# Patient Record
Sex: Female | Born: 1935 | Race: Black or African American | Hispanic: No | Smoking: Former smoker
Health system: Southern US, Community
[De-identification: ages and names within clinical notes are randomized; demographics above are authoritative.]

## PROBLEM LIST (undated history)

## (undated) DIAGNOSIS — I509 Heart failure, unspecified: Secondary | ICD-10-CM

## (undated) DIAGNOSIS — F0390 Unspecified dementia without behavioral disturbance: Secondary | ICD-10-CM

## (undated) DIAGNOSIS — I214 Non-ST elevation (NSTEMI) myocardial infarction: Secondary | ICD-10-CM

## (undated) DIAGNOSIS — D649 Anemia, unspecified: Secondary | ICD-10-CM

## (undated) DIAGNOSIS — I2510 Atherosclerotic heart disease of native coronary artery without angina pectoris: Secondary | ICD-10-CM

## (undated) DIAGNOSIS — N184 Chronic kidney disease, stage 4 (severe): Secondary | ICD-10-CM

## (undated) DIAGNOSIS — I739 Peripheral vascular disease, unspecified: Secondary | ICD-10-CM

## (undated) DIAGNOSIS — C50919 Malignant neoplasm of unspecified site of unspecified female breast: Secondary | ICD-10-CM

## (undated) DIAGNOSIS — L280 Lichen simplex chronicus: Secondary | ICD-10-CM

## (undated) DIAGNOSIS — IMO0001 Reserved for inherently not codable concepts without codable children: Secondary | ICD-10-CM

## (undated) DIAGNOSIS — E7800 Pure hypercholesterolemia, unspecified: Secondary | ICD-10-CM

## (undated) DIAGNOSIS — F3289 Other specified depressive episodes: Secondary | ICD-10-CM

## (undated) DIAGNOSIS — D638 Anemia in other chronic diseases classified elsewhere: Secondary | ICD-10-CM

## (undated) DIAGNOSIS — E119 Type 2 diabetes mellitus without complications: Secondary | ICD-10-CM

## (undated) DIAGNOSIS — I2119 ST elevation (STEMI) myocardial infarction involving other coronary artery of inferior wall: Secondary | ICD-10-CM

## (undated) DIAGNOSIS — I10 Essential (primary) hypertension: Secondary | ICD-10-CM

## (undated) DIAGNOSIS — G4700 Insomnia, unspecified: Secondary | ICD-10-CM

## (undated) DIAGNOSIS — F329 Major depressive disorder, single episode, unspecified: Secondary | ICD-10-CM

## (undated) DIAGNOSIS — R195 Other fecal abnormalities: Secondary | ICD-10-CM

## (undated) DIAGNOSIS — I209 Angina pectoris, unspecified: Secondary | ICD-10-CM

## (undated) HISTORY — DX: Other specified depressive episodes: F32.89

## (undated) HISTORY — DX: Major depressive disorder, single episode, unspecified: F32.9

## (undated) HISTORY — DX: Essential (primary) hypertension: I10

## (undated) HISTORY — DX: Type 2 diabetes mellitus without complications: E11.9

## (undated) HISTORY — PX: CATARACT EXTRACTION W/ INTRAOCULAR LENS  IMPLANT, BILATERAL: SHX1307

## (undated) HISTORY — PX: EYE SURGERY: SHX253

## (undated) HISTORY — DX: Peripheral vascular disease, unspecified: I73.9

## (undated) HISTORY — PX: CORONARY ANGIOPLASTY WITH STENT PLACEMENT: SHX49

## (undated) HISTORY — PX: DILATION AND CURETTAGE OF UTERUS: SHX78

## (undated) HISTORY — PX: CARDIAC SURGERY: SHX584

## (undated) HISTORY — DX: Anemia, unspecified: D64.9

## (undated) HISTORY — DX: Atherosclerotic heart disease of native coronary artery without angina pectoris: I25.10

## (undated) HISTORY — DX: Unspecified dementia, unspecified severity, without behavioral disturbance, psychotic disturbance, mood disturbance, and anxiety: F03.90

## (undated) HISTORY — DX: Insomnia, unspecified: G47.00

---

## 1998-01-01 ENCOUNTER — Other Ambulatory Visit: Admission: RE | Admit: 1998-01-01 | Discharge: 1998-01-01 | Payer: Self-pay | Admitting: Family Medicine

## 1998-07-25 DIAGNOSIS — C50919 Malignant neoplasm of unspecified site of unspecified female breast: Secondary | ICD-10-CM

## 1998-07-25 DIAGNOSIS — I2119 ST elevation (STEMI) myocardial infarction involving other coronary artery of inferior wall: Secondary | ICD-10-CM

## 1998-07-25 HISTORY — DX: ST elevation (STEMI) myocardial infarction involving other coronary artery of inferior wall: I21.19

## 1998-07-25 HISTORY — DX: Malignant neoplasm of unspecified site of unspecified female breast: C50.919

## 1998-07-25 HISTORY — PX: CORONARY ARTERY BYPASS GRAFT: SHX141

## 1998-07-25 HISTORY — PX: BREAST LUMPECTOMY: SHX2

## 1998-11-20 ENCOUNTER — Emergency Department (HOSPITAL_COMMUNITY): Admission: EM | Admit: 1998-11-20 | Discharge: 1998-11-20 | Payer: Self-pay | Admitting: Emergency Medicine

## 1998-12-25 ENCOUNTER — Ambulatory Visit (HOSPITAL_COMMUNITY): Admission: RE | Admit: 1998-12-25 | Discharge: 1998-12-25 | Payer: Self-pay | Admitting: Internal Medicine

## 1998-12-25 ENCOUNTER — Encounter: Payer: Self-pay | Admitting: Internal Medicine

## 1999-01-01 ENCOUNTER — Ambulatory Visit (HOSPITAL_COMMUNITY): Admission: RE | Admit: 1999-01-01 | Discharge: 1999-01-01 | Payer: Self-pay | Admitting: Internal Medicine

## 1999-01-01 ENCOUNTER — Encounter: Payer: Self-pay | Admitting: Internal Medicine

## 1999-01-08 ENCOUNTER — Ambulatory Visit (HOSPITAL_COMMUNITY): Admission: RE | Admit: 1999-01-08 | Discharge: 1999-01-08 | Payer: Self-pay | Admitting: Internal Medicine

## 1999-01-08 ENCOUNTER — Encounter (INDEPENDENT_AMBULATORY_CARE_PROVIDER_SITE_OTHER): Payer: Self-pay

## 1999-01-08 ENCOUNTER — Encounter: Payer: Self-pay | Admitting: Internal Medicine

## 1999-01-25 ENCOUNTER — Encounter (HOSPITAL_BASED_OUTPATIENT_CLINIC_OR_DEPARTMENT_OTHER): Payer: Self-pay | Admitting: General Surgery

## 1999-01-27 ENCOUNTER — Encounter (HOSPITAL_BASED_OUTPATIENT_CLINIC_OR_DEPARTMENT_OTHER): Payer: Self-pay | Admitting: General Surgery

## 1999-01-27 ENCOUNTER — Ambulatory Visit (HOSPITAL_COMMUNITY): Admission: RE | Admit: 1999-01-27 | Discharge: 1999-01-27 | Payer: Self-pay | Admitting: General Surgery

## 1999-02-23 ENCOUNTER — Encounter: Admission: RE | Admit: 1999-02-23 | Discharge: 1999-05-24 | Payer: Self-pay | Admitting: Radiation Oncology

## 1999-05-18 ENCOUNTER — Encounter: Payer: Self-pay | Admitting: Emergency Medicine

## 1999-05-18 ENCOUNTER — Inpatient Hospital Stay (HOSPITAL_COMMUNITY): Admission: EM | Admit: 1999-05-18 | Discharge: 1999-05-22 | Payer: Self-pay | Admitting: Emergency Medicine

## 1999-05-18 ENCOUNTER — Encounter: Payer: Self-pay | Admitting: Cardiology

## 1999-05-21 ENCOUNTER — Encounter: Payer: Self-pay | Admitting: Cardiology

## 1999-05-24 ENCOUNTER — Encounter: Admission: RE | Admit: 1999-05-24 | Discharge: 1999-08-22 | Payer: Self-pay | Admitting: Cardiology

## 1999-07-13 ENCOUNTER — Encounter: Payer: Self-pay | Admitting: Ophthalmology

## 1999-07-14 ENCOUNTER — Ambulatory Visit (HOSPITAL_COMMUNITY): Admission: RE | Admit: 1999-07-14 | Discharge: 1999-07-14 | Payer: Self-pay | Admitting: Cardiology

## 1999-07-14 ENCOUNTER — Encounter: Payer: Self-pay | Admitting: Cardiology

## 1999-07-15 ENCOUNTER — Ambulatory Visit (HOSPITAL_COMMUNITY): Admission: RE | Admit: 1999-07-15 | Discharge: 1999-07-16 | Payer: Self-pay | Admitting: Ophthalmology

## 1999-09-21 ENCOUNTER — Encounter: Payer: Self-pay | Admitting: Internal Medicine

## 1999-09-21 ENCOUNTER — Ambulatory Visit (HOSPITAL_COMMUNITY): Admission: RE | Admit: 1999-09-21 | Discharge: 1999-09-21 | Payer: Self-pay | Admitting: Internal Medicine

## 1999-10-14 ENCOUNTER — Ambulatory Visit (HOSPITAL_COMMUNITY): Admission: RE | Admit: 1999-10-14 | Discharge: 1999-10-14 | Payer: Self-pay | Admitting: Ophthalmology

## 2000-02-08 ENCOUNTER — Encounter: Payer: Self-pay | Admitting: Hematology & Oncology

## 2000-02-08 ENCOUNTER — Encounter: Admission: RE | Admit: 2000-02-08 | Discharge: 2000-02-08 | Payer: Self-pay | Admitting: Hematology & Oncology

## 2000-07-13 ENCOUNTER — Other Ambulatory Visit: Admission: RE | Admit: 2000-07-13 | Discharge: 2000-07-13 | Payer: Self-pay | Admitting: Internal Medicine

## 2000-09-13 ENCOUNTER — Ambulatory Visit (HOSPITAL_COMMUNITY): Admission: RE | Admit: 2000-09-13 | Discharge: 2000-09-13 | Payer: Self-pay | Admitting: Gastroenterology

## 2001-03-05 ENCOUNTER — Encounter: Admission: RE | Admit: 2001-03-05 | Discharge: 2001-06-03 | Payer: Self-pay | Admitting: Internal Medicine

## 2001-07-23 ENCOUNTER — Ambulatory Visit (HOSPITAL_COMMUNITY): Admission: RE | Admit: 2001-07-23 | Discharge: 2001-07-23 | Payer: Self-pay | Admitting: Cardiology

## 2001-07-23 ENCOUNTER — Encounter: Payer: Self-pay | Admitting: Cardiology

## 2001-08-02 ENCOUNTER — Ambulatory Visit (HOSPITAL_COMMUNITY): Admission: RE | Admit: 2001-08-02 | Discharge: 2001-08-03 | Payer: Self-pay | Admitting: Cardiology

## 2001-08-28 ENCOUNTER — Other Ambulatory Visit: Admission: RE | Admit: 2001-08-28 | Discharge: 2001-08-28 | Payer: Self-pay | Admitting: Internal Medicine

## 2001-09-21 ENCOUNTER — Encounter: Payer: Self-pay | Admitting: Internal Medicine

## 2001-09-21 ENCOUNTER — Ambulatory Visit (HOSPITAL_COMMUNITY): Admission: RE | Admit: 2001-09-21 | Discharge: 2001-09-21 | Payer: Self-pay | Admitting: Internal Medicine

## 2002-01-30 ENCOUNTER — Encounter: Payer: Self-pay | Admitting: Internal Medicine

## 2002-01-30 ENCOUNTER — Encounter: Admission: RE | Admit: 2002-01-30 | Discharge: 2002-01-30 | Payer: Self-pay | Admitting: Internal Medicine

## 2004-07-25 HISTORY — PX: COMBINED HYSTEROSCOPY DIAGNOSTIC / D&C: SUR297

## 2004-08-09 ENCOUNTER — Inpatient Hospital Stay (HOSPITAL_COMMUNITY): Admission: AD | Admit: 2004-08-09 | Discharge: 2004-08-12 | Payer: Self-pay | Admitting: Cardiology

## 2004-08-23 ENCOUNTER — Ambulatory Visit (HOSPITAL_COMMUNITY): Admission: RE | Admit: 2004-08-23 | Discharge: 2004-08-23 | Payer: Self-pay | Admitting: *Deleted

## 2004-08-23 ENCOUNTER — Encounter (INDEPENDENT_AMBULATORY_CARE_PROVIDER_SITE_OTHER): Payer: Self-pay | Admitting: *Deleted

## 2004-11-08 ENCOUNTER — Encounter: Admission: RE | Admit: 2004-11-08 | Discharge: 2004-11-08 | Payer: Self-pay | Admitting: Orthopedic Surgery

## 2005-03-27 ENCOUNTER — Emergency Department (HOSPITAL_COMMUNITY): Admission: EM | Admit: 2005-03-27 | Discharge: 2005-03-27 | Payer: Self-pay | Admitting: Emergency Medicine

## 2005-04-19 ENCOUNTER — Emergency Department (HOSPITAL_COMMUNITY): Admission: EM | Admit: 2005-04-19 | Discharge: 2005-04-19 | Payer: Self-pay | Admitting: *Deleted

## 2005-11-22 ENCOUNTER — Encounter: Payer: Self-pay | Admitting: Internal Medicine

## 2005-11-24 ENCOUNTER — Inpatient Hospital Stay (HOSPITAL_BASED_OUTPATIENT_CLINIC_OR_DEPARTMENT_OTHER): Admission: RE | Admit: 2005-11-24 | Discharge: 2005-11-24 | Payer: Self-pay | Admitting: Cardiology

## 2005-12-01 ENCOUNTER — Inpatient Hospital Stay (HOSPITAL_COMMUNITY): Admission: AD | Admit: 2005-12-01 | Discharge: 2005-12-03 | Payer: Self-pay | Admitting: Cardiology

## 2005-12-21 ENCOUNTER — Encounter: Admission: RE | Admit: 2005-12-21 | Discharge: 2005-12-21 | Payer: Self-pay | Admitting: Internal Medicine

## 2006-12-15 ENCOUNTER — Ambulatory Visit (HOSPITAL_COMMUNITY): Admission: RE | Admit: 2006-12-15 | Discharge: 2006-12-15 | Payer: Self-pay | Admitting: Cardiology

## 2007-01-09 ENCOUNTER — Encounter: Admission: RE | Admit: 2007-01-09 | Discharge: 2007-01-09 | Payer: Self-pay | Admitting: Internal Medicine

## 2007-01-18 ENCOUNTER — Encounter: Payer: Self-pay | Admitting: Internal Medicine

## 2008-03-12 ENCOUNTER — Emergency Department (HOSPITAL_COMMUNITY): Admission: EM | Admit: 2008-03-12 | Discharge: 2008-03-12 | Payer: Self-pay | Admitting: Family Medicine

## 2008-03-27 ENCOUNTER — Encounter: Payer: Self-pay | Admitting: Internal Medicine

## 2008-05-01 ENCOUNTER — Encounter: Admission: RE | Admit: 2008-05-01 | Discharge: 2008-05-01 | Payer: Self-pay | Admitting: Internal Medicine

## 2008-12-30 ENCOUNTER — Emergency Department (HOSPITAL_COMMUNITY): Admission: EM | Admit: 2008-12-30 | Discharge: 2008-12-30 | Payer: Self-pay | Admitting: Emergency Medicine

## 2008-12-30 ENCOUNTER — Encounter: Payer: Self-pay | Admitting: Internal Medicine

## 2008-12-30 LAB — CONVERTED CEMR LAB
Bilirubin Urine: NEGATIVE
CO2: 21 meq/L
Calcium: 8.8 mg/dL
Eosinophils Relative: 0 %
Glucose, Urine, Semiquant: NEGATIVE
Ketones, urine, test strip: NEGATIVE
Monocytes Relative: 0.4 %
Neutrophils Relative %: 6.8 %
Potassium: 4.2 meq/L
RDW: 14 %
Sodium: 141 meq/L

## 2009-03-09 ENCOUNTER — Encounter: Payer: Self-pay | Admitting: Internal Medicine

## 2009-03-09 DIAGNOSIS — E1165 Type 2 diabetes mellitus with hyperglycemia: Secondary | ICD-10-CM

## 2009-03-09 DIAGNOSIS — I10 Essential (primary) hypertension: Secondary | ICD-10-CM

## 2009-03-09 DIAGNOSIS — E1129 Type 2 diabetes mellitus with other diabetic kidney complication: Secondary | ICD-10-CM

## 2009-03-10 ENCOUNTER — Ambulatory Visit: Payer: Self-pay | Admitting: Internal Medicine

## 2009-03-10 DIAGNOSIS — E785 Hyperlipidemia, unspecified: Secondary | ICD-10-CM

## 2009-03-10 DIAGNOSIS — I2510 Atherosclerotic heart disease of native coronary artery without angina pectoris: Secondary | ICD-10-CM

## 2009-03-10 DIAGNOSIS — I739 Peripheral vascular disease, unspecified: Secondary | ICD-10-CM

## 2009-03-10 DIAGNOSIS — D649 Anemia, unspecified: Secondary | ICD-10-CM

## 2009-03-10 LAB — CONVERTED CEMR LAB
Albumin: 4.2 g/dL (ref 3.5–5.2)
BUN: 30 mg/dL — ABNORMAL HIGH (ref 6–23)
Basophils Absolute: 0 10*3/uL (ref 0.0–0.1)
Calcium: 9.3 mg/dL (ref 8.4–10.5)
Creatinine, Ser: 1.4 mg/dL — ABNORMAL HIGH (ref 0.4–1.2)
GFR calc non Af Amer: 46.75 mL/min (ref >60)
Hemoglobin: 10.3 g/dL — ABNORMAL LOW (ref 12.0–15.0)
Hgb A1c MFr Bld: 7.3 % — ABNORMAL HIGH (ref 4.6–6.5)
LDL Cholesterol: 80 mg/dL (ref 0–99)
Lymphocytes Relative: 33.7 % (ref 12.0–46.0)
Monocytes Relative: 7.5 % (ref 3.0–12.0)
Neutro Abs: 2.3 10*3/uL (ref 1.4–7.7)
Neutrophils Relative %: 56.2 % (ref 43.0–77.0)
Potassium: 5.8 meq/L — ABNORMAL HIGH (ref 3.5–5.1)
RBC: 3.18 M/uL — ABNORMAL LOW (ref 3.87–5.11)
RDW: 13.5 % (ref 11.5–14.6)
Saturation Ratios: 21 % (ref 20.0–50.0)
Total CHOL/HDL Ratio: 3
Total Protein: 8.1 g/dL (ref 6.0–8.3)
Transferrin: 231.1 mg/dL (ref 212.0–360.0)
Triglycerides: 63 mg/dL (ref 0.0–149.0)
Vitamin B-12: 291 pg/mL (ref 211–911)

## 2009-03-13 ENCOUNTER — Ambulatory Visit: Payer: Self-pay

## 2009-03-13 ENCOUNTER — Encounter: Payer: Self-pay | Admitting: Internal Medicine

## 2009-03-16 ENCOUNTER — Encounter: Payer: Self-pay | Admitting: Internal Medicine

## 2009-03-18 ENCOUNTER — Encounter: Payer: Self-pay | Admitting: Internal Medicine

## 2009-03-25 ENCOUNTER — Ambulatory Visit: Payer: Self-pay | Admitting: Internal Medicine

## 2009-03-25 DIAGNOSIS — G4700 Insomnia, unspecified: Secondary | ICD-10-CM | POA: Insufficient documentation

## 2009-03-31 ENCOUNTER — Encounter: Payer: Self-pay | Admitting: Internal Medicine

## 2009-04-04 DIAGNOSIS — Z8679 Personal history of other diseases of the circulatory system: Secondary | ICD-10-CM | POA: Insufficient documentation

## 2009-04-20 ENCOUNTER — Ambulatory Visit: Payer: Self-pay | Admitting: Cardiovascular Disease

## 2009-06-24 ENCOUNTER — Ambulatory Visit: Payer: Self-pay | Admitting: Internal Medicine

## 2009-06-24 DIAGNOSIS — F329 Major depressive disorder, single episode, unspecified: Secondary | ICD-10-CM

## 2009-06-24 DIAGNOSIS — F0390 Unspecified dementia without behavioral disturbance: Secondary | ICD-10-CM

## 2009-06-24 LAB — CONVERTED CEMR LAB
ALT: 13 units/L (ref 0–35)
AST: 25 units/L (ref 0–37)
Basophils Relative: 0.5 % (ref 0.0–3.0)
Bilirubin, Direct: 0.1 mg/dL (ref 0.0–0.3)
Chloride: 111 meq/L (ref 96–112)
Eosinophils Relative: 2.2 % (ref 0.0–5.0)
GFR calc non Af Amer: 46.72 mL/min (ref >60)
HCT: 30.9 % — ABNORMAL LOW (ref 36.0–46.0)
Hgb A1c MFr Bld: 8.5 % — ABNORMAL HIGH (ref 4.6–6.5)
Lymphs Abs: 1.4 10*3/uL (ref 0.7–4.0)
MCV: 96.4 fL (ref 78.0–100.0)
Monocytes Absolute: 0.3 10*3/uL (ref 0.1–1.0)
Monocytes Relative: 6 % (ref 3.0–12.0)
Potassium: 4.9 meq/L (ref 3.5–5.1)
RBC: 3.21 M/uL — ABNORMAL LOW (ref 3.87–5.11)
Sodium: 142 meq/L (ref 135–145)
TSH: 1.1 microintl units/mL (ref 0.35–5.50)
Total Protein: 7.5 g/dL (ref 6.0–8.3)
WBC: 5.2 10*3/uL (ref 4.5–10.5)

## 2009-06-26 ENCOUNTER — Encounter (INDEPENDENT_AMBULATORY_CARE_PROVIDER_SITE_OTHER): Payer: Self-pay | Admitting: *Deleted

## 2009-07-01 ENCOUNTER — Ambulatory Visit: Payer: Self-pay | Admitting: Cardiology

## 2009-07-06 ENCOUNTER — Encounter: Payer: Self-pay | Admitting: Internal Medicine

## 2009-07-07 ENCOUNTER — Encounter: Payer: Self-pay | Admitting: Internal Medicine

## 2009-07-29 ENCOUNTER — Ambulatory Visit: Payer: Self-pay | Admitting: Internal Medicine

## 2009-08-18 ENCOUNTER — Ambulatory Visit: Payer: Self-pay | Admitting: Cardiovascular Disease

## 2009-08-18 DIAGNOSIS — R21 Rash and other nonspecific skin eruption: Secondary | ICD-10-CM | POA: Insufficient documentation

## 2009-08-19 ENCOUNTER — Ambulatory Visit: Payer: Self-pay | Admitting: Internal Medicine

## 2009-09-04 ENCOUNTER — Ambulatory Visit: Payer: Self-pay | Admitting: Cardiovascular Disease

## 2009-09-07 DIAGNOSIS — R55 Syncope and collapse: Secondary | ICD-10-CM

## 2009-09-08 ENCOUNTER — Encounter (INDEPENDENT_AMBULATORY_CARE_PROVIDER_SITE_OTHER): Payer: Self-pay | Admitting: Internal Medicine

## 2009-09-08 ENCOUNTER — Ambulatory Visit: Payer: Self-pay | Admitting: Vascular Surgery

## 2009-09-08 ENCOUNTER — Inpatient Hospital Stay (HOSPITAL_COMMUNITY): Admission: EM | Admit: 2009-09-08 | Discharge: 2009-09-10 | Payer: Self-pay | Admitting: Emergency Medicine

## 2009-09-08 ENCOUNTER — Ambulatory Visit: Payer: Self-pay | Admitting: Cardiology

## 2009-09-09 ENCOUNTER — Encounter (INDEPENDENT_AMBULATORY_CARE_PROVIDER_SITE_OTHER): Payer: Self-pay | Admitting: Internal Medicine

## 2009-09-11 ENCOUNTER — Emergency Department (HOSPITAL_COMMUNITY): Admission: EM | Admit: 2009-09-11 | Discharge: 2009-09-11 | Payer: Self-pay | Admitting: Family Medicine

## 2009-09-14 ENCOUNTER — Ambulatory Visit: Payer: Self-pay | Admitting: Internal Medicine

## 2009-09-16 ENCOUNTER — Telehealth: Payer: Self-pay | Admitting: Internal Medicine

## 2009-10-06 ENCOUNTER — Telehealth: Payer: Self-pay | Admitting: Internal Medicine

## 2009-10-11 ENCOUNTER — Inpatient Hospital Stay (HOSPITAL_COMMUNITY): Admission: EM | Admit: 2009-10-11 | Discharge: 2009-10-14 | Payer: Self-pay | Admitting: Emergency Medicine

## 2009-10-12 ENCOUNTER — Encounter (INDEPENDENT_AMBULATORY_CARE_PROVIDER_SITE_OTHER): Payer: Self-pay | Admitting: Cardiology

## 2009-10-12 LAB — CONVERTED CEMR LAB
HDL: 27 mg/dL
Hemoglobin: 9.8 g/dL
LDL Cholesterol: 73 mg/dL
Platelets: 201 10*3/uL
WBC: 5.4 10*3/uL

## 2009-10-22 ENCOUNTER — Encounter (INDEPENDENT_AMBULATORY_CARE_PROVIDER_SITE_OTHER): Payer: Self-pay | Admitting: *Deleted

## 2009-10-23 DIAGNOSIS — I214 Non-ST elevation (NSTEMI) myocardial infarction: Secondary | ICD-10-CM

## 2009-10-23 HISTORY — DX: Non-ST elevation (NSTEMI) myocardial infarction: I21.4

## 2009-10-29 ENCOUNTER — Encounter (HOSPITAL_COMMUNITY): Admission: RE | Admit: 2009-10-29 | Discharge: 2010-01-05 | Payer: Self-pay | Admitting: Cardiology

## 2009-11-02 ENCOUNTER — Inpatient Hospital Stay (HOSPITAL_COMMUNITY): Admission: EM | Admit: 2009-11-02 | Discharge: 2009-11-04 | Payer: Self-pay | Admitting: Emergency Medicine

## 2009-11-02 LAB — CONVERTED CEMR LAB: TSH: 2.086 microintl units/mL

## 2009-11-04 LAB — CONVERTED CEMR LAB
Calcium: 8.5 mg/dL
Chloride: 108 meq/L
Glucose, Bld: 137 mg/dL
Potassium: 3.8 meq/L
Sodium: 139 meq/L

## 2009-11-11 ENCOUNTER — Encounter: Payer: Self-pay | Admitting: Internal Medicine

## 2009-11-16 ENCOUNTER — Encounter: Payer: Self-pay | Admitting: Internal Medicine

## 2009-11-18 ENCOUNTER — Emergency Department (HOSPITAL_COMMUNITY): Admission: EM | Admit: 2009-11-18 | Discharge: 2009-11-18 | Payer: Self-pay | Admitting: Family Medicine

## 2009-11-23 ENCOUNTER — Ambulatory Visit: Payer: Self-pay | Admitting: Internal Medicine

## 2009-12-01 ENCOUNTER — Ambulatory Visit: Payer: Self-pay | Admitting: Internal Medicine

## 2009-12-01 DIAGNOSIS — J329 Chronic sinusitis, unspecified: Secondary | ICD-10-CM | POA: Insufficient documentation

## 2009-12-04 ENCOUNTER — Encounter: Payer: Self-pay | Admitting: Internal Medicine

## 2009-12-11 ENCOUNTER — Ambulatory Visit: Payer: Self-pay | Admitting: Internal Medicine

## 2009-12-11 DIAGNOSIS — R05 Cough: Secondary | ICD-10-CM

## 2009-12-28 ENCOUNTER — Telehealth: Payer: Self-pay | Admitting: Internal Medicine

## 2010-01-07 ENCOUNTER — Telehealth: Payer: Self-pay | Admitting: Internal Medicine

## 2010-01-11 ENCOUNTER — Telehealth: Payer: Self-pay | Admitting: Internal Medicine

## 2010-01-27 ENCOUNTER — Ambulatory Visit: Payer: Self-pay | Admitting: Internal Medicine

## 2010-01-29 ENCOUNTER — Ambulatory Visit: Payer: Self-pay | Admitting: Internal Medicine

## 2010-03-01 ENCOUNTER — Ambulatory Visit: Payer: Self-pay | Admitting: Internal Medicine

## 2010-03-02 ENCOUNTER — Ambulatory Visit: Payer: Self-pay | Admitting: Internal Medicine

## 2010-03-15 ENCOUNTER — Encounter: Payer: Self-pay | Admitting: Internal Medicine

## 2010-03-16 ENCOUNTER — Ambulatory Visit: Payer: Self-pay

## 2010-03-16 ENCOUNTER — Encounter: Payer: Self-pay | Admitting: Internal Medicine

## 2010-03-30 ENCOUNTER — Encounter: Payer: Self-pay | Admitting: Internal Medicine

## 2010-04-08 ENCOUNTER — Encounter: Payer: Self-pay | Admitting: Internal Medicine

## 2010-04-13 ENCOUNTER — Telehealth: Payer: Self-pay | Admitting: Internal Medicine

## 2010-04-19 ENCOUNTER — Encounter: Payer: Self-pay | Admitting: Internal Medicine

## 2010-04-26 ENCOUNTER — Encounter: Payer: Self-pay | Admitting: Internal Medicine

## 2010-05-17 ENCOUNTER — Encounter: Payer: Self-pay | Admitting: Internal Medicine

## 2010-05-27 ENCOUNTER — Encounter: Payer: Self-pay | Admitting: Internal Medicine

## 2010-05-31 ENCOUNTER — Telehealth: Payer: Self-pay | Admitting: Internal Medicine

## 2010-06-02 ENCOUNTER — Ambulatory Visit: Payer: Self-pay | Admitting: Internal Medicine

## 2010-06-03 LAB — CONVERTED CEMR LAB
Basophils Absolute: 0 10*3/uL (ref 0.0–0.1)
Basophils Relative: 0.5 % (ref 0.0–3.0)
Eosinophils Absolute: 0.2 10*3/uL (ref 0.0–0.7)
Hemoglobin: 10.2 g/dL — ABNORMAL LOW (ref 12.0–15.0)
Lymphs Abs: 1.2 10*3/uL (ref 0.7–4.0)
MCHC: 34 g/dL (ref 30.0–36.0)
MCV: 94.7 fL (ref 78.0–100.0)
Monocytes Absolute: 0.4 10*3/uL (ref 0.1–1.0)
Neutro Abs: 3.5 10*3/uL (ref 1.4–7.7)
RBC: 3.17 M/uL — ABNORMAL LOW (ref 3.87–5.11)
RDW: 14.1 % (ref 11.5–14.6)

## 2010-06-25 ENCOUNTER — Encounter: Payer: Self-pay | Admitting: Internal Medicine

## 2010-07-16 ENCOUNTER — Encounter: Payer: Self-pay | Admitting: Internal Medicine

## 2010-08-18 ENCOUNTER — Ambulatory Visit
Admission: RE | Admit: 2010-08-18 | Discharge: 2010-08-18 | Payer: Self-pay | Source: Home / Self Care | Attending: Internal Medicine | Admitting: Internal Medicine

## 2010-08-24 ENCOUNTER — Ambulatory Visit
Admission: RE | Admit: 2010-08-24 | Discharge: 2010-08-24 | Payer: Self-pay | Source: Home / Self Care | Attending: Internal Medicine | Admitting: Internal Medicine

## 2010-08-24 NOTE — Progress Notes (Signed)
Summary: Cough  Phone Note Call from Patient Call back at Home Phone [phone]   Summary of Call: Family memeber called for pt. Pt completed medication and has recurence of her cough. Please advise.  Initial call taken by: Lamar Sprinkles, CMA,  January 07, 2010 5:17 PM  Follow-up for Phone Call        will refer to pulm - order done Follow-up by: Newt Lukes MD,  January 07, 2010 6:14 PM  Additional Follow-up for Phone Call Additional follow up Details #1::        pt informed Additional Follow-up by: Margaret Pyle, CMA,  January 08, 2010 9:49 AM

## 2010-08-24 NOTE — Progress Notes (Signed)
Summary: DM supplier clarification  Phone Note Outgoing Call   Call placed by: Orlan Leavens RMA,  May 31, 2010 12:04 PM Call placed to: Patient Details for Reason: FYI Summary of Call: Called pt to verify which diabetic supply place she get her supplies from. Md keep getting several fax's from different places. Pt states she use [facility]. Also pt states she need appt to see md to discuss her blood sugars. Was told by cardiologist to stop taking metformin, but pt states she did not stop taking because her blood sugars has been running high. Made appt 06/02/10 @ 11:15. Initial call taken by: Orlan Leavens RMA,  May 31, 2010 12:05 PM  Follow-up for Phone Call        ok - noted - thanks Follow-up by: Newt Lukes MD,  May 31, 2010 12:14 PM

## 2010-08-24 NOTE — Progress Notes (Signed)
Summary: cough  Phone Note Call from Patient   Caller: [name] [phone] Summary of Call: pt's son called stating that pt has cough but can not "get mucous up". pt's son is requesting MD advised about OTC medication safe for pt to use to help "break it". please advise Initial call taken by: Margaret Pyle, CMA,  September 16, 2009 2:52 PM  Follow-up for Phone Call        try robitussin (plain) syrup or tablet - drink with lots of water Follow-up by: Newt Lukes MD,  September 16, 2009 3:28 PM  Additional Follow-up for Phone Call Additional follow up Details #1::        Son informed, pt also informed Additional Follow-up by: Margaret Pyle, CMA,  September 16, 2009 3:34 PM

## 2010-08-24 NOTE — Assessment & Plan Note (Signed)
Summary: 3-4 wk f/u #/cd   Vital Signs:  Patient profile:   77 year old female Height:      63 inches (160.02 cm) Weight:      162.0 pounds (73.64 kg) O2 Sat:      98 % on Room air Temp:     98.2 degrees F (36.78 degrees C) oral Pulse rate:   67 / minute BP sitting:   144 / 78  (left arm) Cuff size:   large  Vitals Entered By: Orlan Leavens (July 29, 2009 10:54 AM)  O2 Flow:  Room air CC: 4 week follow-up Is Patient Diabetic? Yes Did you bring your meter with you today? No Pain Assessment Patient in pain? no        Primary Care Provider:  Newt Lukes MD  CC:  4 week follow-up.  History of Present Illness: here for followup on several issues -  1) memory loss -  reviewed labs and head ct - normal pt reports she is under emotional stress due to son's poor health (bad liver) feels this is why she is distracted and not thinking straight - forgot to pay light bill agrees to starting medication for depression  2) DM2 - reports sugars much improved due to starting new med (glipizide added 12/1) no hypoglycemia -  3)HTN - reports compliance with ongoing medical treatment and no changes in medication dose or frequency. denies adverse side effects related to current therapy.   Current Medications (verified): 1)  Metformin Hcl 1000 Mg Tabs (Metformin Hcl) .... Take 1 Two Times A Day 2)  Lisinopril 20 Mg Tabs (Lisinopril) .... Take 1 By Mouth Qd 3)  Plavix 75 Mg Tabs (Clopidogrel Bisulfate) .... Take 1 By Mouth Qd 4)  Lipitor 20 Mg Tabs (Atorvastatin Calcium) .... Take 1 By Mouth Qd 5)  Nitrostat 0.4 Mg Subl (Nitroglycerin) .... Use Prn 6)  Daily Vitamins For Women  Tabs (Multiple Vitamins-Calcium) .... Take 1 By Mouth Qd 7)  Glipizide 2.5 Mg Xr24h-Tab (Glipizide) .Marland Kitchen.. 1 By Mouth Every Morning  Allergies (verified): No Known Drug Allergies  Past History:  Past Medical History: Last updated: 06/24/2009 CAD (ICD-414.00), reports MI in 2000, treating with PCI  (stenting) PVD (ICD-443.9) ANGINA, HX OF (ICD-V12.50) HYPERTENSION (ICD-401.9) DYSLIPIDEMIA (ICD-272.4) INSOMNIA, CHRONIC (ICD-307.42) ANEMIA (ICD-285.9) DIABETES MELLITUS, TYPE II (ICD-250.00) HEPATOTOXICITY, DRUG-INDUCED, RISK OF (ICD-V58.69) PERSONAL HISTORY OF MALIGNANT NEOPLASM OF BREAST (ICD-V10.3)  Review of Systems  The patient denies fever, chest pain, syncope, and headaches.    Physical Exam  General:  alert, well-developed, well-nourished, and cooperative to examination.    Lungs:  normal respiratory effort, no intercostal retractions or use of accessory muscles; normal breath sounds bilaterally - no crackles and no wheezes.    Heart:  normal rate, regular rhythm, no murmur, and no rub. BLE without edema.  Psych:  Oriented X3, normally interactive, poor eye contact, not anxious appearing, mildly depressed appearing, but not agitated.      Impression & Recommendations:  Problem # 1:  DEPRESSION (ICD-311)  likely contrib to forgetfulness - start low dose SSRI -  f/u 2 mos to monitor effects, sooner if problems consider aricept trial if still no notable improvment Her updated medication list for this problem includes:    Paxil 10 Mg Tabs (Paroxetine hcl) .Marland Kitchen... 1 by mouth at bedtime  Orders: Prescription Created Electronically [phone])  Problem # 2:  DIABETES MELLITUS, TYPE II (ICD-250.00)  pt reports sugars "perfect" when she took med two times a  day (by mistake - misunderstood instructs!) - as no hypoglycemia, ok to cont two times a day - new rx send recheck a1c next visit Her updated medication list for this problem includes:    Metformin Hcl 1000 Mg Tabs (Metformin hcl) .Marland Kitchen... Take 1 two times a day    Lisinopril 20 Mg Tabs (Lisinopril) .Marland Kitchen... Take 1 by mouth qd    Glipizide 2.5 Mg Xr24h-tab (Glipizide) .Marland Kitchen... 1 by mouth two times a day  Orders: Prescription Created Electronically [phone])  Complete Medication List: 1)  Metformin Hcl 1000 Mg Tabs (Metformin  hcl) .... Take 1 two times a day 2)  Lisinopril 20 Mg Tabs (Lisinopril) .... Take 1 by mouth qd 3)  Plavix 75 Mg Tabs (Clopidogrel bisulfate) .... Take 1 by mouth qd 4)  Lipitor 20 Mg Tabs (Atorvastatin calcium) .... Take 1 by mouth qd 5)  Nitrostat 0.4 Mg Subl (Nitroglycerin) .... Use prn 6)  Daily Vitamins For Women Tabs (Multiple vitamins-calcium) .... Take 1 by mouth qd 7)  Glipizide 2.5 Mg Xr24h-tab (Glipizide) .Marland Kitchen.. 1 by mouth two times a day 8)  Paxil 10 Mg Tabs (Paroxetine hcl) .Marland Kitchen.. 1 by mouth at bedtime  Patient Instructions: 1)  it was good to see you today.  2)  will start new medication for depression to try and help memory function - Paxil - your prescriptions have been electronically submitted to your pharmacy. Please take as directed. Contact our office if you believe you're having problems with the medication(s).  3)  will also increase Glipizide to two times a day for your diabetes - call if sugar is less than 70. 4)  Please schedule a follow-up appointment in 2 months to review the memory problems and effects of new medication, call sooner if problems.  Prescriptions: GLIPIZIDE 2.5 MG XR24H-TAB (GLIPIZIDE) 1 by mouth two times a day  #60 x 2   Entered and Authorized by:   Newt Lukes MD   Signed by:   Newt Lukes MD on 07/29/2009   Method used:   [facility] to        [facility] [address] [phone]* (retail)       [address]       [place], [place]  [address]       Ph: [phone] or [phone]       Fax: [phone]   RxID:   [id number] PAXIL 10 MG TABS (PAROXETINE HCL) 1 by mouth at bedtime  #30 x 2   Entered and Authorized by:   Newt Lukes MD   Signed by:   Newt Lukes MD on 07/29/2009   Method used:   [facility]        [facility] [address] [phone]* (retail)       [address]       [place], [place]  [address]       Ph: [phone] or [phone]       Fax: [phone]    RxID:   [id number]

## 2010-08-24 NOTE — Progress Notes (Signed)
Summary: iophen-c nr liquid  Phone Note Refill Request Message from:  Fax from Pharmacy on January 11, 2010 1:06 PM  Refills Requested: Medication #1:  Iophen-c NR Liquid take 1 teaspoon po tid for 2 days, then every 6 hours prn for cough # 200.0 ml   Last Refilled: 12/11/2009 [email or web address]  [phone]  Initial call taken by: Orlan Leavens,  January 11, 2010 1:08 PM  Follow-up for Phone Call        ok to fill as prev rx, no refills - thanks Follow-up by: Newt Lukes MD,  January 11, 2010 1:24 PM  Additional Follow-up for Phone Call Additional follow up Details #1::        Faxed  paper request back to pharmacy ok x's 1 only per md Additional Follow-up by: Orlan Leavens,  January 11, 2010 1:30 PM    New/Updated Medications: IOPHEN C-NR 100-10 MG/5ML LIQD (GUAIFENESIN-CODEINE) take 1 teaspoon by mouth three times a day for 2 days, then every 6 hours as needed for cough Prescriptions: IOPHEN C-NR 100-10 MG/5ML LIQD (GUAIFENESIN-CODEINE) take 1 teaspoon by mouth three times a day for 2 days, then every 6 hours as needed for cough  #22ml x 0   Entered by:   Orlan Leavens   Authorized by:   Newt Lukes MD   Signed by:   Orlan Leavens on 01/11/2010   Method used:   Telephoned to ...       [facility] [address] [phone]* (retail)       [address]       [place], [place]  [address]       Ph: [phone] or [phone]       Fax: [phone]   RxID:   [id number]

## 2010-08-24 NOTE — Letter (Signed)
Summary: Appointment - Missed  [facility], Main Office  [address]   [place], [place] [address]   Phone: [phone]  Fax: [phone]     October 22, 2009 MRN: [id number]   [facility] [address] [address] [place], [place]  [address]   Dear Ms. Elford,  Our records indicate you missed your appointment on     09/24/09                   with Dr.       .          Excell Seltzer                          It is very important that we reach you to reschedule this appointment. We look forward to participating in your health care needs. Please contact us at the number listed above at your earliest convenience to reschedule this appointment.     Sincerely,    Glass blower/designer

## 2010-08-24 NOTE — Miscellaneous (Signed)
Summary: Orders Update  Clinical Lists Changes  Orders: Added new Test order of Arterial Duplex Lower Extremity (Arterial Duplex Low) - Signed 

## 2010-08-24 NOTE — Assessment & Plan Note (Signed)
Summary: COUGH/NWS   Vital Signs:  Patient profile:   77 year old female Height:      63 inches (160.02 cm) Weight:      161 pounds (73.18 kg) O2 Sat:      98 % on Room air Temp:     99.0 degrees F (37.22 degrees C) oral Pulse rate:   56 / minute BP sitting:   130 / 62  (left arm) Cuff size:   regular  Vitals Entered By: Orlan Leavens (Dec 11, 2009 3:52 PM)  O2 Flow:  Room air CC: Ongoing cough Is Patient Diabetic? Yes Did you bring your meter with you today? No Pain Assessment Patient in pain? no        Primary Care Provider:  Newt Lukes MD  CC:  Ongoing cough.  History of Present Illness:  Cough      This is a 77 year old woman who presents with Cough.  The symptoms began 3 days ago.  The intensity is described as moderate.  persisting cough since hosp for bronchitis in 08/2009 -.  The patient reports non-productive cough, but denies shortness of breath, wheezing, fever, and hemoptysis.  Associated symtpoms include nasal congestion and chronic rhinitis.  The patient denies the following symptoms: cold/URI symptoms, sore throat, acid reflux symptoms, and peripheral edema.  The cough is worse with lying down.  Ineffective prior treatments have included OTC cough medication.  Diagnostic testing to date has included CXR.    Current Medications (verified): 1)  Metformin Hcl 1000 Mg Tabs (Metformin Hcl) .... Take 1 Two Times A Day 2)  Glipizide 5 Mg Tabs (Glipizide) .Marland Kitchen.. 1 By Mouth Two Times A Day 3)  Aspirin 325 Mg Tabs (Aspirin) .Marland Kitchen.. 1 By Mouth Once Daily 4)  Plavix 75 Mg Tabs (Clopidogrel Bisulfate) .... Take 1 By Mouth Once Daily 5)  Coreg 6.25 Mg Tabs (Carvedilol) .Marland Kitchen.. 1 By Mouth Two Times A Day 6)  Norvasc 10 Mg Tabs (Amlodipine Besylate) .Marland Kitchen.. 1 By Mouth Once Daily 7)  Lipitor 20 Mg Tabs (Atorvastatin Calcium) .... Take 1 By Mouth Once Daily 8)  Nitrostat 0.4 Mg Subl (Nitroglycerin) .... Use Prn 9)  Daily Vitamins For Women  Tabs (Multiple Vitamins-Calcium) ....  Take 1 By Mouth Once Daily 10)  Paxil 10 Mg Tabs (Paroxetine Hcl) .Marland Kitchen.. 1 By Mouth At Bedtime 11)  Lantus 100 Unit/ml Soln (Insulin Glargine) .Marland Kitchen.. 15 Units Subcutaneously Once Daily 12)  Nu-Iron 150 Mg Caps (Polysaccharide Iron Complex) .Marland Kitchen.. 1 By Mouth Two Times A Day 13)  Flonase 50 Mcg/act Susp (Fluticasone Propionate) .... 2 Sprays Each Nostril  Every Morning 14)  Lisinopril 20 Mg Tabs (Lisinopril) .... Stop Until Further Notice - May Be Causing Cough!  Allergies (verified): No Known Drug Allergies  Past History:  Past Medical History: CAD (ICD-414.00), reports MI in 2000, treating with PCI (stenting), NQWMI 09/2009 PVD (ICD-443.9) HYPERTENSION (ICD-401.9), malignant  DYSLIPIDEMIA (ICD-272.4) INSOMNIA, CHRONIC (ICD-307.42) ANEMIA (ICD-285.9) DIABETES MELLITUS, TYPE II (ICD-250.00)  MD rooster: cards - cooper (CAD/PAD)/harwani  Review of Systems       The patient complains of prolonged cough.  The patient denies anorexia, fever, weight loss, chest pain, dyspnea on exertion, peripheral edema, and severe indigestion/heartburn.    Physical Exam  General:  alert, well-developed, well-nourished, and cooperative to examination.   fatigued and coughing but nontoxic, son at side Lungs:  normal respiratory effort, no intercostal retractions or use of accessory muscles; normal breath sounds bilaterally - no crackles and no  wheezes.    Heart:  normal rate, regular rhythm, no murmur, and no rub. BLE without edema.    Impression & Recommendations:  Problem # 1:  COUGH (ICD-786.2) exam benign - s/p recent tx with emperic abx and off ACEI w/o change in symptoms - cont to aggrssive cough suppression -  1)  tessalon pearls  2)  PPI 3)  hard candy to bathe back of throat during day, no throat clearing 4)  codiene cough syrup 5) recheck CXR r/o underlying dz - if still unimproved symptoms will plan referral to pulm for assistance Orders: T-2 View CXR ([id number])  Complete Medication  List: 1)  Metformin Hcl 1000 Mg Tabs (Metformin hcl) .... Take 1 two times a day 2)  Glipizide 5 Mg Tabs (Glipizide) .Marland Kitchen.. 1 by mouth two times a day 3)  Aspirin 325 Mg Tabs (Aspirin) .Marland Kitchen.. 1 by mouth once daily 4)  Plavix 75 Mg Tabs (Clopidogrel bisulfate) .... Take 1 by mouth once daily 5)  Coreg 6.25 Mg Tabs (Carvedilol) .Marland Kitchen.. 1 by mouth two times a day 6)  Norvasc 10 Mg Tabs (Amlodipine besylate) .Marland Kitchen.. 1 by mouth once daily 7)  Lipitor 20 Mg Tabs (Atorvastatin calcium) .... Take 1 by mouth once daily 8)  Nitrostat 0.4 Mg Subl (Nitroglycerin) .... Use prn 9)  Daily Vitamins For Women Tabs (Multiple vitamins-calcium) .... Take 1 by mouth once daily 10)  Paxil 10 Mg Tabs (Paroxetine hcl) .Marland Kitchen.. 1 by mouth at bedtime 11)  Lantus 100 Unit/ml Soln (Insulin glargine) .Marland Kitchen.. 15 units subcutaneously once daily 12)  Nu-iron 150 Mg Caps (Polysaccharide iron complex) .Marland Kitchen.. 1 by mouth two times a day 13)  Flonase 50 Mcg/act Susp (Fluticasone propionate) .... 2 sprays each nostril  every morning 14)  Mytussin Ac 100-10 Mg/47ml Syrp (Guaifenesin-codeine) .Marland Kitchen.. 1 tsp by mouth three times a day x 2 days, then every 6 hours as needed for cough 15)  Tessalon 200 Mg Caps (Benzonatate) .Marland Kitchen.. 1 by mouth three times a day x 5 days, then as needed for cough 16)  Prilosec 20 Mg Cpdr (Omeprazole) .Marland Kitchen.. 1 by mouth two times a day x 7days, then once daily  Patient Instructions: 1)  it was good to see you today. 2)  stop lisinopril - if you develop worsening symptoms or fever, call us and we can reconsider antibiotics but it does not appear necessary to use any anitbiotic at this time  3)  will treat cough aggressively as discussed:  4)  tessalon pearls 200mg  3x/day x 5days, then every 6hrs during day if needed.  Can stop after 2 weeks if doing well. 5)   prilosec 20mg  two times a day x 7 days, then once daily  6)  hard candy to bathe back of throat during day, no throat clearing 7)  limit voice use, no singing for next 2 weeks.   8)  codiene cough syrup three times a day x 2days, then as needed  9)  chest xray ordered today - your results will be called to you in 48-72 hours from the time of test completion 10)  if continued coughing despite this, call us for referral to lung specialist Prescriptions: PRILOSEC 20 MG CPDR (OMEPRAZOLE) 1 by mouth two times a day x 7days, then once daily  #40 x 1   Entered and Authorized by:   Newt Lukes MD   Signed by:   Newt Lukes MD on 12/11/2009   Method used:   Print then Give to  Patient   RxID:   [id number] TESSALON 200 MG CAPS (BENZONATATE) 1 by mouth three times a day x 5 days, then as needed for cough  #40 x 1   Entered and Authorized by:   Newt Lukes MD   Signed by:   Newt Lukes MD on 12/11/2009   Method used:   Print then Give to Patient   RxID:   [id number] MYTUSSIN AC 100-10 MG/5ML SYRP (GUAIFENESIN-CODEINE) 1 tsp by mouth three times a day x 2 days, then every 6 hours as needed for cough  #200cc x 0   Entered and Authorized by:   Newt Lukes MD   Signed by:   Newt Lukes MD on 12/11/2009   Method used:   Print then Give to Patient   RxID:   [id number]

## 2010-08-24 NOTE — Assessment & Plan Note (Signed)
Summary: elevated BS/LMB   Vital Signs:  Patient profile:   77 year old female Height:      63 inches (160.02 cm) Weight:      165.4 pounds (75.18 kg) O2 Sat:      98 % on Room air Temp:     97.7 degrees F (36.50 degrees C) oral Pulse rate:   53 / minute BP sitting:   132 / 60  (left arm) Cuff size:   regular  Vitals Entered By: Orlan Leavens RMA (June 02, 2010 11:16 AM)  O2 Flow:  Room air CC: Elevated BS Is Patient Diabetic? Yes Did you bring your meter with you today? Yes CBG Result 296 Comments Pt states was told by cardiologist to stop taking metformin, but pt states she still been taking because BS been running high. Pt check yest am it was 147.   Primary Care Provider:  Newt Lukes MD  CC:  Elevated BS.  History of Present Illness: here for f/u -  1) DM2 - variable cbgs - high >500, never <100 still confusion about medications -  prev given insulin in hosp and rx for lantus to use at home but never used it d/t confusion/fear denies hypoglycemia symptoms or events +PU/PD - no fever or CP, no abd pain or n/v/d checks cbgs sporatically at home  2) CAD - hosp x 2 in Mar 2011 and April 2011 for NQWMI notes and labs from hosp reviewed - no further syncopre, no CP, no edema  3) anemia - continues to feel weak without change - taking iron as rx'd = no adv Se reported report colo 06/2009 reviewed - neg  4) HTN - reports compliance with ongoing medical treatment - denies adverse side effects related to current therapy.   4) dyslipidemia- reports compliance with ongoing medical treatment and no changes in medication dose or frequency. denies adverse side effects related to current therapy.   Clinical Review Panels:  Lipid Management   Cholesterol:  126 (10/12/2009)   LDL (bad choesterol):  73 (10/12/2009)   HDL (good cholesterol):  27 (10/12/2009)   Triglycerides:  130 (10/12/2009)  Diabetes Management   HgBA1C:  9.1 (01/27/2010)   Creatinine:  1.5  (01/27/2010)   Last Foot Exam:  yes (03/10/2009)   Last Flu Vaccine:  Fluvax 3+ (04/24/2010)  CBC   WBC:  5.4 (10/12/2009)   RBC:  3.21 (06/24/2009)   Hgb:  9.8 (10/12/2009)   Hct:  30.9 (06/24/2009)   Platelets:  201 (10/12/2009)   MCV  96.4 (06/24/2009)   MCHC  34.1 (06/24/2009)   RDW  12.7 (06/24/2009)   PMN:  64.9 (06/24/2009)   Lymphs:  26.4 (06/24/2009)   Monos:  6.0 (06/24/2009)   Eosinophils:  2.2 (06/24/2009)   Basophil:  0.5 (06/24/2009)  Complete Metabolic Panel   Glucose:  137 (11/04/2009)   Sodium:  139 (11/04/2009)   Potassium:  3.8 (11/04/2009)   Chloride:  108 (11/04/2009)   CO2:  23 (11/04/2009)   BUN:  10 (11/04/2009)   Creatinine:  1.5 (01/27/2010)   Albumin:  3.7 (06/24/2009)   Total Protein:  7.5 (06/24/2009)   Calcium:  8.5 (11/04/2009)   Total Bili:  0.5 (06/24/2009)   Alk Phos:  145 (06/24/2009)   SGPT (ALT):  13 (06/24/2009)   SGOT (AST):  25 (06/24/2009)   Current Medications (verified): 1)  Metformin Hcl 1000 Mg Tabs (Metformin Hcl) .... Take 1 Two Times A Day 2)  Plavix 75 Mg Tabs (  Clopidogrel Bisulfate) .... Take 1 By Mouth Once Daily 3)  Aspirin 325 Mg Tabs (Aspirin) .Marland Kitchen.. 1 By Mouth Once Daily 4)  Glipizide 5 Mg Tabs (Glipizide) .Marland Kitchen.. 1 By Mouth Two Times A Day 5)  Lipitor 20 Mg Tabs (Atorvastatin Calcium) .... Take 1 Tab By Mouth At Bedtime 6)  Coreg 6.25 Mg Tabs (Carvedilol) .Marland Kitchen.. 1 By Mouth Two Times A Day 7)  Hydrochlorothiazide 12.5 Mg Caps (Hydrochlorothiazide) .... Take 1 Capsule By Mouth Once A Day 8)  Norvasc 10 Mg Tabs (Amlodipine Besylate) .Marland Kitchen.. 1 By Mouth Once Daily 9)  Benicar 20 Mg  Tabs (Olmesartan Medoxomil) .... One Tablet By Mouth Daily 10)  Paxil 10 Mg Tabs (Paroxetine Hcl) .Marland Kitchen.. 1 By Mouth At Bedtime 11)  Pepcid Ac Maximum Strength 20 Mg Tabs (Famotidine) .... One At Bedtime 12)  Nu-Iron 150 Mg Caps (Polysaccharide Iron Complex) .Marland Kitchen.. 1 By Mouth Two Times A Day 13)  Daily Vitamins For Women  Tabs (Multiple Vitamins-Calcium)  .... Take 1 By Mouth Once Daily 14)  Unisom Sleepgels 50 Mg Caps (Diphenhydramine Hcl (Sleep)) .... Take 1 Tab By Mouth At Bedtime As Needed 15)  Nitrostat 0.4 Mg Subl (Nitroglycerin) .Marland Kitchen.. 1 Under Tongue Every 5 Minutes X3 As Needed Chest Pain  Allergies (verified): No Known Drug Allergies  Past History:  Past Medical History: CAD (ICD-414.00), reports MI in 2000, treating with PCI (stenting), NQWMI 09/2009 PVD  HYPERTENSION, malignant      - D/c ACE January 29, 2010 for cough > better March 01, 2010  DYSLIPIDEMIA INSOMNIA, CHRONIC  ANEMIA  DIABETES MELLITUS, TYPE II COMPLEX MED REGIMEN--Meds reviewed with pt education and computerized med calendar completed March 02, 2010.  COUGH--?ace inhibitor related- ace stopped 01/2010.   MD roster:  cards - cooper (CAD/PAD)/harwani pulm - wert  Review of Systems  The patient denies fever, hoarseness, chest pain, syncope, dyspnea on exertion, and peripheral edema.    Physical Exam  General:  alert, well-developed, well-nourished, and cooperative to examination.    Lungs:  normal respiratory effort, no intercostal retractions or use of accessory muscles; normal breath sounds bilaterally - no crackles and no wheezes.    Heart:  normal rate, regular rhythm, no murmur, and no rub. BLE without edema.  Psych:  Oriented X3, normally interactive,fair eye contact, not anxious appearing, not depressed appearing, but not agitated.      Impression & Recommendations:  Problem # 1:  DIABETES MELLITUS, TYPE II (ICD-250.00)  Her updated medication list for this problem includes:    Metformin Hcl 1000 Mg Tabs (Metformin hcl) .Marland Kitchen... Take 1 two times a day    Aspirin 325 Mg Tabs (Aspirin) .Marland Kitchen... 1 by mouth once daily    Benicar 20 Mg Tabs (Olmesartan medoxomil) ..... One tablet by mouth daily    Glipizide 5 Mg Tabs (Glipizide) .Marland Kitchen... 1 by mouth two times a day  Orders: Glucose, (CBG) (82962) TLB-A1C / Hgb A1C (Glycohemoglobin)  (83036-A1C)  uncontrolled by hx - range 150-500-  still with med confusion - unable to manage insulin (or lantus) on prior attempt spring 2011 continue metformin and glipizide as Cr returned to normal  (and pt wants to take) -   consider input of endo if unable to gain control with review of meds today - Time spent with patient  and dtr 25 minutes, more than 50% of this time was spent counseling patient on meds for DM and need forcompliance; also hosp review of 09/2009 and 10/2009  Labs Reviewed:  Creat: 1.5 (01/27/2010)    Reviewed HgBA1c results: 9.1 (01/27/2010)  9.2 (11/02/2009)  Problem # 2:  HYPERTENSION (ICD-401.9)  Her updated medication list for this problem includes:    Coreg 6.25 Mg Tabs (Carvedilol) .Marland Kitchen... 1 by mouth two times a day    Hydrochlorothiazide 12.5 Mg Caps (Hydrochlorothiazide) .Marland Kitchen... Take 1 capsule by mouth once a day    Norvasc 10 Mg Tabs (Amlodipine besylate) .Marland Kitchen... 1 by mouth once daily    Benicar 20 Mg Tabs (Olmesartan medoxomil) ..... One tablet by mouth daily  controlled on rx   BP today: 132/60 Prior BP: 122/68 (03/02/2010)  Labs Reviewed: K+: 3.8 (11/04/2009) Creat: : 1.5 (01/27/2010)   Chol: 126 (10/12/2009)   HDL: 27 (10/12/2009)   LDL: 73 (10/12/2009)   TG: 130 (10/12/2009)  Problem # 3:  ANEMIA (ICD-285.9)  Her updated medication list for this problem includes:    Nu-iron 150 Mg Caps (Polysaccharide iron complex) .Marland Kitchen... 1 by mouth two times a day  colo 07/07/2009 normal- continue oral iron -  Hgb: 9.8 (10/12/2009)   Hct: 30.9 (06/24/2009)   Platelets: 201 (10/12/2009) RBC: 3.21 (06/24/2009)   RDW: 12.7 (06/24/2009)   WBC: 5.4 (10/12/2009) MCV: 96.4 (06/24/2009)   MCHC: 34.1 (06/24/2009) Ferritin: 108.2 (03/10/2009) Iron: 68 (03/10/2009)   % Sat: 21.0 (03/10/2009) B12: 291 (03/10/2009)   Folate: 11.3 (03/10/2009)   TSH: 2.086 (11/02/2009)  Orders: TLB-CBC Platelet - w/Differential (85025-CBCD)  Complete Medication List: 1)  Metformin Hcl  1000 Mg Tabs (Metformin hcl) .... Take 1 two times a day 2)  Plavix 75 Mg Tabs (Clopidogrel bisulfate) .... Take 1 by mouth once daily 3)  Aspirin 325 Mg Tabs (Aspirin) .Marland Kitchen.. 1 by mouth once daily 4)  Lipitor 20 Mg Tabs (Atorvastatin calcium) .... Take 1 tab by mouth at bedtime 5)  Coreg 6.25 Mg Tabs (Carvedilol) .Marland Kitchen.. 1 by mouth two times a day 6)  Hydrochlorothiazide 12.5 Mg Caps (Hydrochlorothiazide) .... Take 1 capsule by mouth once a day 7)  Norvasc 10 Mg Tabs (Amlodipine besylate) .Marland Kitchen.. 1 by mouth once daily 8)  Benicar 20 Mg Tabs (Olmesartan medoxomil) .... One tablet by mouth daily 9)  Paxil 10 Mg Tabs (Paroxetine hcl) .Marland Kitchen.. 1 by mouth at bedtime 10)  Pepcid Ac Maximum Strength 20 Mg Tabs (Famotidine) .... One at bedtime 11)  Nu-iron 150 Mg Caps (Polysaccharide iron complex) .Marland Kitchen.. 1 by mouth two times a day 12)  Daily Vitamins For Women Tabs (Multiple vitamins-calcium) .... Take 1 by mouth once daily 13)  Unisom Sleepgels 50 Mg Caps (Diphenhydramine hcl (sleep)) .... Take 1 tab by mouth at bedtime as needed 14)  Nitrostat 0.4 Mg Subl (Nitroglycerin) .Marland Kitchen.. 1 under tongue every 5 minutes x3 as needed chest pain 15)  Glipizide 5 Mg Tabs (Glipizide) .Marland Kitchen.. 1 by mouth two times a day  Patient Instructions: 1)  it was good to see you today. 2)  diabetes medications reviewed - no insulin but take both pills (metformin and glipizide) two times a day  3)  test(s) ordered today - your results will be posted on the phone tree for review in 48-72 hours from the time of test completion; call [phone] and enter your 9 digit MRN (listed above on this page, just below your name); if any changes need to be made or there are abnormal results, you will be contacted directly.  4)  Please schedule a follow-up appointment in 3-4 months, sooner if problems.    Orders Added: 1)  Glucose, (CBG) [82962]  2)  Est. Patient Level IV [99214] 3)  TLB-A1C / Hgb A1C (Glycohemoglobin) [83036-A1C] 4)  TLB-CBC Platelet -  w/Differential [85025-CBCD]   Immunization History:  Influenza Immunization History:    Influenza:  fluvax 3+ (04/24/2010)   Immunization History:  Influenza Immunization History:    Influenza:  Fluvax 3+ (04/24/2010)  Laboratory Results   Blood Tests     CBG Random:: 296mg /dL

## 2010-08-24 NOTE — Medication Information (Signed)
Summary: [employer]   Imported By: Lester  04/05/2010 09:15:00  _____________________________________________________________________  External Attachment:    Type:   Image     Comment:   External Document

## 2010-08-24 NOTE — Letter (Signed)
Summary: Lumbar Traction Belt/[facility]  Lumbar Traction Belt/[facility]   Imported By: Sherian Rein 06/30/2010 07:10:12  _____________________________________________________________________  External Attachment:    Type:   Image     Comment:   External Document

## 2010-08-24 NOTE — Letter (Signed)
Summary: Request for Lumbar Orthosis/[facility]  Request for Lumbar Orthosis/[facility]   Imported By: Sherian Rein 11/19/2009 10:53:14  _____________________________________________________________________  External Attachment:    Type:   Image     Comment:   External Document

## 2010-08-24 NOTE — Assessment & Plan Note (Signed)
Summary: 2-3 MTH FU  STC   Vital Signs:  Patient profile:   77 year old female Height:      63 inches (160.02 cm) Weight:      157.8 pounds (71.73 kg) O2 Sat:      98 % on Room air Temp:     98.4 degrees F (36.89 degrees C) oral Pulse rate:   55 / minute BP sitting:   158 / 72  (left arm) Cuff size:   regular  Vitals Entered By: Orlan Leavens (January 27, 2010 1:14 PM)  O2 Flow:  Room air CC: 3 month follow-up Is Patient Diabetic? Yes Did you bring your meter with you today? No Pain Assessment Patient in pain? no      Comments t states need refills on all meds   Primary Care Provider:  Newt Lukes MD  CC:  3 month follow-up.  History of Present Illness:  C/o continued cough persisting cough since hosp for bronchitis in 08/2009  non-productive cough, but denies shortness of breath, wheezing, fever, and hemoptysis.   denies the following symptoms: cold/URI symptoms, sore throat, acid reflux symptoms, and peripheral edema.  The cough is worse with lying down.  Ineffective prior treatments have included OTC cough medication.  Diagnostic testing to date has included CXR.  Plannoing pulm eval 01/29/10 (this Fri)  also review other med issues 1) DM2 - confusion about medications -  given insulin in hosp and rx for lantus to use at home -  but not using it denies hypoglycemia symptoms or events +PU/PD - no fever or CP, no abd pain or n/v/d checks cbgs sporatically at home  2) CAD - hosp x 2 3/20-21 and 4/11-13 in 2011 for NQWMI notes and labs from hosp reviewed - no further syncopre, no CP, no edema  3) anemia - feels weak without acute change - taking iron  as rx'd = no adv Se reported recent colo 06/2009 reviewed - neg  4) HTN - reports compliance with ongoing medical treatment - changes in medications from recent hosp reviewed denies adverse side effects related to current therapy.    Clinical Review Panels:  Diabetes Management   HgBA1C:  9.2 (11/02/2009)  Creatinine:  0.99 (11/04/2009)   Last Foot Exam:  yes (03/10/2009)  CBC   WBC:  5.4 (10/12/2009)   RBC:  3.21 (06/24/2009)   Hgb:  9.8 (10/12/2009)   Hct:  30.9 (06/24/2009)   Platelets:  201 (10/12/2009)   MCV  96.4 (06/24/2009)   MCHC  34.1 (06/24/2009)   RDW  12.7 (06/24/2009)   PMN:  64.9 (06/24/2009)   Lymphs:  26.4 (06/24/2009)   Monos:  6.0 (06/24/2009)   Eosinophils:  2.2 (06/24/2009)   Basophil:  0.5 (06/24/2009)  Complete Metabolic Panel   Glucose:  137 (11/04/2009)   Sodium:  139 (11/04/2009)   Potassium:  3.8 (11/04/2009)   Chloride:  108 (11/04/2009)   CO2:  23 (11/04/2009)   BUN:  10 (11/04/2009)   Creatinine:  0.99 (11/04/2009)   Albumin:  3.7 (06/24/2009)   Total Protein:  7.5 (06/24/2009)   Calcium:  8.5 (11/04/2009)   Total Bili:  0.5 (06/24/2009)   Alk Phos:  145 (06/24/2009)   SGPT (ALT):  13 (06/24/2009)   SGOT (AST):  25 (06/24/2009)   Current Medications (verified): 1)  Metformin Hcl 1000 Mg Tabs (Metformin Hcl) .... Take 1 Two Times A Day 2)  Glipizide 5 Mg Tabs (Glipizide) .Marland Kitchen.. 1 By Mouth Two Times  A Day 3)  Aspirin 325 Mg Tabs (Aspirin) .Marland Kitchen.. 1 By Mouth Once Daily 4)  Plavix 75 Mg Tabs (Clopidogrel Bisulfate) .... Take 1 By Mouth Once Daily 5)  Coreg 6.25 Mg Tabs (Carvedilol) .Marland Kitchen.. 1 By Mouth Two Times A Day 6)  Norvasc 10 Mg Tabs (Amlodipine Besylate) .Marland Kitchen.. 1 By Mouth Once Daily 7)  Lipitor 20 Mg Tabs (Atorvastatin Calcium) .... Take 1 By Mouth Once Daily 8)  Nitrostat 0.4 Mg Subl (Nitroglycerin) .... Use Prn 9)  Daily Vitamins For Women  Tabs (Multiple Vitamins-Calcium) .... Take 1 By Mouth Once Daily 10)  Paxil 10 Mg Tabs (Paroxetine Hcl) .Marland Kitchen.. 1 By Mouth At Bedtime 11)  Lantus 100 Unit/ml Soln (Insulin Glargine) .Marland Kitchen.. 15 Units Subcutaneously Once Daily 12)  Nu-Iron 150 Mg Caps (Polysaccharide Iron Complex) .Marland Kitchen.. 1 By Mouth Two Times A Day 13)  Flonase 50 Mcg/act Susp (Fluticasone Propionate) .... 2 Sprays Each Nostril  Every Morning 14)   Prilosec 20 Mg Cpdr (Omeprazole) .Marland Kitchen.. 1 By Mouth Two Times A Day X 7days, Then Once Daily 15)  Iophen C-Nr 100-10 Mg/33ml Liqd (Guaifenesin-Codeine) .... Take 1 Teaspoon By Mouth Three Times A Day For 2 Days, Then Every 6 Hours As Needed For Cough  Allergies (verified): No Known Drug Allergies  Past History:  Past Medical History: CAD (ICD-414.00), reports MI in 2000, treating with PCI (stenting), NQWMI 09/2009 PVD (ICD-443.9) HYPERTENSION (ICD-401.9), malignant  DYSLIPIDEMIA (ICD-272.4) INSOMNIA, CHRONIC (ICD-307.42) ANEMIA (ICD-285.9) DIABETES MELLITUS, TYPE II (ICD-250.00)  MD roster: cards - cooper (CAD/PAD)/harwani pulm - (wert)  Review of Systems  The patient denies weight loss, chest pain, hemoptysis, and abdominal pain.    Physical Exam  General:  alert, well-developed, well-nourished, and cooperative to examination.    Lungs:  normal respiratory effort, no intercostal retractions or use of accessory muscles; normal breath sounds bilaterally - no crackles and no wheezes.    Heart:  normal rate, regular rhythm, no murmur, and no rub. BLE without edema.    Impression & Recommendations:  Problem # 1:  DIABETES MELLITUS, TYPE II (ICD-250.00)  The following medications were removed from the medication list:    Lantus 100 Unit/ml Soln (Insulin glargine) .Marland KitchenMarland KitchenMarland KitchenMarland Kitchen 15 units subcutaneously once daily Her updated medication list for this problem includes:    Metformin Hcl 1000 Mg Tabs (Metformin hcl) .Marland Kitchen... Take 1 two times a day    Glipizide 5 Mg Tabs (Glipizide) .Marland Kitchen... 1 by mouth two times a day    Aspirin 325 Mg Tabs (Aspirin) .Marland Kitchen... 1 by mouth once daily  uncontrolled - but still with med confusion - not on lantus continnue metformin and OHA as Cr returned to normal at hosp DC (and pt wants to take) -   expect pt would do better on insulin but need to transition this for pt comfort - hold for now Time spent with patient  and dtr 25 minutes, more than 50% of this time was spent  counseling patient on meds for DM and need for titration and compliance; also hosp review of 09/2009 and 10/2009 stays  Labs Reviewed: Creat: 0.99 (11/04/2009)    Reviewed HgBA1c results: 9.2 (11/02/2009)  8.5 (06/24/2009)  Orders: TLB-A1C / Hgb A1C (Glycohemoglobin) (83036-A1C) TLB-Creatinine, Blood (82565-CREA)  Problem # 2:  DYSLIPIDEMIA (ICD-272.4)  Her updated medication list for this problem includes:    Lipitor 20 Mg Tabs (Atorvastatin calcium) .Marland Kitchen... Take 1 by mouth once daily  Labs Reviewed: SGOT: 25 (06/24/2009)   SGPT: 13 (06/24/2009)   HDL:27 (10/12/2009),  45.70 (03/10/2009)  LDL:73 (10/12/2009), 80 (03/10/2009)  Chol:126 (10/12/2009), 138 (03/10/2009)  Trig:130 (10/12/2009), 63.0 (03/10/2009)  Problem # 3:  HYPERTENSION (ICD-401.9)  subop today - ?decongestant effect - rec no med changes except to avoid OTC cold and cough meds Her updated medication list for this problem includes:    Coreg 6.25 Mg Tabs (Carvedilol) .Marland Kitchen... 1 by mouth two times a day    Norvasc 10 Mg Tabs (Amlodipine besylate) .Marland Kitchen... 1 by mouth once daily  Orders: TLB-Creatinine, Blood (82565-CREA)  BP today: 158/72 Prior BP: 130/62 (12/11/2009)  Labs Reviewed: K+: 3.8 (11/04/2009) Creat: : 0.99 (11/04/2009)   Chol: 126 (10/12/2009)   HDL: 27 (10/12/2009)   LDL: 73 (10/12/2009)   TG: 130 (10/12/2009)  Problem # 4:  COUGH (ICD-786.2) cont symptoms - upcoming pulm OV 01/29/10 with wert - defer further med mgmt to pulm - last OV reviewed: exam benign - s/p recent tx with emperic abx and off ACEI w/o change in symptoms - cont to aggrssive cough suppression -  1)  tessalon pearls  2)  PPI 3)  hard candy to bathe back of throat during day, no throat clearing 4)  codiene cough syrup 5) recheck CXR r/o underlying dz - (neg) if still unimproved symptoms will plan referral to pulm for assistance  Complete Medication List: 1)  Metformin Hcl 1000 Mg Tabs (Metformin hcl) .... Take 1 two times a day 2)   Glipizide 5 Mg Tabs (Glipizide) .Marland Kitchen.. 1 by mouth two times a day 3)  Aspirin 325 Mg Tabs (Aspirin) .Marland Kitchen.. 1 by mouth once daily 4)  Plavix 75 Mg Tabs (Clopidogrel bisulfate) .... Take 1 by mouth once daily 5)  Coreg 6.25 Mg Tabs (Carvedilol) .Marland Kitchen.. 1 by mouth two times a day 6)  Norvasc 10 Mg Tabs (Amlodipine besylate) .Marland Kitchen.. 1 by mouth once daily 7)  Lipitor 20 Mg Tabs (Atorvastatin calcium) .... Take 1 by mouth once daily 8)  Nitrostat 0.4 Mg Subl (Nitroglycerin) .... Use prn 9)  Daily Vitamins For Women Tabs (Multiple vitamins-calcium) .... Take 1 by mouth once daily 10)  Paxil 10 Mg Tabs (Paroxetine hcl) .Marland Kitchen.. 1 by mouth at bedtime 11)  Nu-iron 150 Mg Caps (Polysaccharide iron complex) .Marland Kitchen.. 1 by mouth two times a day 12)  Flonase 50 Mcg/act Susp (Fluticasone propionate) .... 2 sprays each nostril  every morning 13)  Prilosec 20 Mg Cpdr (Omeprazole) .Marland Kitchen.. 1 by mouth two times a day x 7days, then once daily 14)  Iophen C-nr 100-10 Mg/110ml Liqd (Guaifenesin-codeine) .... Take 1 teaspoon by mouth three times a day for 2 days, then every 6 hours as needed for cough  Patient Instructions: 1)  it was good to see you today. 2)  stop lantus for now - 3)  test(s) ordered today - your results will be posted on the phone tree for review in 48-72 hours from the time of test completion; call [phone] and enter your 9 digit MRN (listed above on this page, just below your name); if any changes need to be made or there are abnormal results, you will be contacted directly.  4)  keep followup with dr, wert, lung specialist as planned for 01/29/10 for the cough 5)  Please schedule a follow-up appointment in 3-4 months, sooner if problems.  Prescriptions: PAXIL 10 MG TABS (PAROXETINE HCL) 1 by mouth at bedtime  #30 Tablet x 5   Entered by:   Orlan Leavens   Authorized by:   Newt Lukes MD   Signed  by:   Orlan Leavens on 01/27/2010   Method used:   Electronically to        [facility] [address] [phone]* (retail)        [address]       [place], [place]  [address]       Ph: [phone] or [phone]       Fax: [phone]   RxID:   [id number] LIPITOR 20 MG TABS (ATORVASTATIN CALCIUM) take 1 by mouth once daily  #30 x 5   Entered by:   Orlan Leavens   Authorized by:   Newt Lukes MD   Signed by:   Orlan Leavens on 01/27/2010   Method used:   [facility]ronically to        [facility] [address] [phone]* (retail)       [address]       [place], [place]  [address]       Ph: [phone] or [phone]       Fax: [phone]   RxID:   [id number] NORVASC 10 MG TABS (AMLODIPINE BESYLATE) 1 by mouth once daily  #30 x 5   Entered by:   Orlan Leavens   Authorized by:   Newt Lukes MD   Signed by:   Orlan Leavens on 01/27/2010   Method used:   [facility]ronically to        [facility] [address] [phone]* (retail)       [address]       [place], [place]  [address]       Ph: [phone] or [phone]       Fax: [phone]   RxID:   [id number] COREG 6.25 MG TABS (CARVEDILOL) 1 by mouth two times a day  #60 x 5   Entered by:   Orlan Leavens   Authorized by:   Newt Lukes MD   Signed by:   Orlan Leavens on 01/27/2010   Method used:   Electronically to        [facility] [address] [phone]* (retail)       [address]       [place], [place]  [address]       Ph: [phone] or [phone]       Fax: [phone]   RxID:   [id number] PLAVIX 75 MG TABS (CLOPIDOGREL BISULFATE) take 1 by mouth once daily  #30 x 5   Entered by:   Orlan Leavens   Authorized by:   Newt Lukes MD   Signed by:   Orlan Leavens on 01/27/2010   Method used:   Electronically to        [facility] [address] [phone]* (retail)       [address]       [place], [place]  [address]       Ph: [phone] or [phone]       Fax: [phone]   RxID:   [id number] GLIPIZIDE 5 MG TABS (GLIPIZIDE)  1 by mouth two times a day  #60 x 5   Entered by:   Orlan Leavens   Authorized by:   Newt Lukes MD   Signed by:   Orlan Leavens on 01/27/2010   Method used:   Electronically  to        [facility] [address] [phone]* (retail)       [address]       [place], [place]  [address]       Ph: [phone] or [phone]       Fax: [phone]   RxID:   [id number] METFORMIN HCL 1000 MG TABS (METFORMIN HCL) take 1 two times a day  #60 Tablet x 5   Entered by:   Orlan Leavens   Authorized by:   Newt Lukes MD   Signed by:   Orlan Leavens on 01/27/2010   Method used:   Electronically to        [facility] [address] [phone]* (retail)       [address]       [place], [place]  [address]       Ph: [phone] or [phone]       Fax: [phone]   RxID:   [id number]

## 2010-08-24 NOTE — Assessment & Plan Note (Signed)
Summary: ROV   Visit Type:  Follow-up Primary Provider:  Newt Lukes MD  CC:  No complaints.  History of Present Illness: 77 year-old woman presents for follow-up evaluation of lower extremity PAD. She complains of dark discoloration of the left heel and dorsal side of the left foot for approximately one year. Also complains of itching in this area. She does not walk long distances - states 'I'm not able to walk far because I'm almost 77 years old.'  Walking is limited by generalized fatigue, 'tiredness,' she denies calf pain or leg weakness with ambulation. No rest pain or history of ulcerations on the feet. No history of stroke or TIA.  Lower extremity ultrasound/ABI's showing ABI's of 0.76 on the right and 0.74 on the left. SFA velocities were elevated bilaterally, but less than 3 m/s throughout. There was good amplitude but monophasic flow at the ankle bilaterally.  The patient has had no change in her symptoms since her initial evaluation 4 months ago.  Current Medications (verified): 1)  Metformin Hcl 1000 Mg Tabs (Metformin Hcl) .... Take 1 Two Times A Day 2)  Lisinopril 20 Mg Tabs (Lisinopril) .... Take 1 By Mouth Qd 3)  Plavix 75 Mg Tabs (Clopidogrel Bisulfate) .... Take 1 By Mouth Qd 4)  Lipitor 20 Mg Tabs (Atorvastatin Calcium) .... Take 1 By Mouth Qd 5)  Nitrostat 0.4 Mg Subl (Nitroglycerin) .... Use Prn 6)  Daily Vitamins For Women  Tabs (Multiple Vitamins-Calcium) .... Take 1 By Mouth Qd 7)  Glipizide 2.5 Mg Xr24h-Tab (Glipizide) .Marland Kitchen.. 1 By Mouth Two Times A Day 8)  Paxil 10 Mg Tabs (Paroxetine Hcl) .Marland Kitchen.. 1 By Mouth At Bedtime  Allergies (verified): No Known Drug Allergies  Past History:  Past medical history reviewed for relevance to current acute and chronic problems.  Past Medical History: Reviewed history from 06/24/2009 and no changes required. CAD (ICD-414.00), reports MI in 2000, treating with PCI (stenting) PVD (ICD-443.9) ANGINA, HX OF  (ICD-V12.50) HYPERTENSION (ICD-401.9) DYSLIPIDEMIA (ICD-272.4) INSOMNIA, CHRONIC (ICD-307.42) ANEMIA (ICD-285.9) DIABETES MELLITUS, TYPE II (ICD-250.00) HEPATOTOXICITY, DRUG-INDUCED, RISK OF (ICD-V58.69) PERSONAL HISTORY OF MALIGNANT NEOPLASM OF BREAST (ICD-V10.3)  Vital Signs:  Patient profile:   77 year old female Height:      63 inches Weight:      162 pounds BMI:     28.80 Pulse rate:   67 / minute Pulse rhythm:   regular Resp:     18 per minute BP sitting:   192 / 76  (left arm) Cuff size:   large  Vitals Entered By: Vikki Ports (August 18, 2009 3:11 PM)  Serial Vital Signs/Assessments:  Time      Position  BP       Pulse  Resp  Temp     By           R Arm     180/74                         Vikki Ports   Physical Exam  General:  Pt is alert and oriented, in no acute distress. HEENT: normal Neck: normal carotid upstrokes without bruits, JVP normal Lungs: CTA CV: RRR without murmur or gallop Abd: soft, NT, positive BS, no bruit, no organomegaly Ext: no clubbing, cyanosis, or edema. pedal pulses 1+= Skin: warm and dry, thickened skin with scaled, darkened rash on the dorsum of the left foot and left heel.     Impression & Recommendations:  Problem #  1:  RASH AND OTHER NONSPECIFIC SKIN ERUPTION (ICD-782.1) The patient has a rash on the left foot, this is not related to her stable lower extremity vascular disease. I'm uncertain of the etiology. Discussed with Dr Felicity Coyer and will refer to [facility] for further evaluation.  Problem # 2:  PVD (ICD-443.9) Stable by exam and symptoms. Continue current medical Rx.  Other Orders: Misc. Referral (Misc. Ref)  Patient Instructions: 1)  Your physician wants you to follow-up in:   1 YEAR. You will receive a reminder letter in the mail two months in advance. If you don't receive a letter, please call our office to schedule the follow-up appointment. 2)  You have been referred to [facility]  [phone]).  Cardiology attempted to refer the pt to Dermatology but due to the pt's insurance the pt has to see her primary physician for referral.

## 2010-08-24 NOTE — Assessment & Plan Note (Signed)
Summary: cough  for months--w/a Polidori blood-stc   Vital Signs:  Patient profile:   77 year old female Height:      63 inches (160.02 cm) Weight:      161.4 pounds (73.36 kg) O2 Sat:      98 % on Room air Temp:     98.7 degrees F (37.06 degrees C) oral Pulse rate:   64 / minute BP sitting:   138 / 62  (left arm) Cuff size:   regular  Vitals Entered By: Orlan Leavens (Dec 01, 2009 9:19 AM)  O2 Flow:  Room air CC: Ongoing cough, Cough Is Patient Diabetic? Yes Did you bring your meter with you today? No Pain Assessment Patient in pain? no        Primary Care Provider:  Newt Lukes MD  CC:  Ongoing cough and Cough.  History of Present Illness:  Cough      This is a 77 year old woman who presents with Cough.  The symptoms began 3 days ago.  The intensity is described as moderate.  persisting cough since hosp for bronchitis in 08/2009 -.  The patient reports non-productive cough, but denies shortness of breath, wheezing, fever, and hemoptysis.  Associated symtpoms include nasal congestion and chronic rhinitis.  The patient denies the following symptoms: cold/URI symptoms, sore throat, acid reflux symptoms, and peripheral edema.  The cough is worse with lying down.  Ineffective prior treatments have included OTC cough medication.  Diagnostic testing to date has included CXR.    Current Medications (verified): 1)  Metformin Hcl 1000 Mg Tabs (Metformin Hcl) .... Take 1 Two Times A Day 2)  Glipizide 5 Mg Tabs (Glipizide) .Marland Kitchen.. 1 By Mouth Two Times A Day 3)  Aspirin 325 Mg Tabs (Aspirin) .Marland Kitchen.. 1 By Mouth Once Daily 4)  Plavix 75 Mg Tabs (Clopidogrel Bisulfate) .... Take 1 By Mouth Once Daily 5)  Lisinopril 20 Mg Tabs (Lisinopril) .... Take 1 By Mouth Once Daily 6)  Coreg 6.25 Mg Tabs (Carvedilol) .Marland Kitchen.. 1 By Mouth Two Times A Day 7)  Norvasc 10 Mg Tabs (Amlodipine Besylate) .Marland Kitchen.. 1 By Mouth Once Daily 8)  Lipitor 20 Mg Tabs (Atorvastatin Calcium) .... Take 1 By Mouth Once Daily 9)   Nitrostat 0.4 Mg Subl (Nitroglycerin) .... Use Prn 10)  Daily Vitamins For Women  Tabs (Multiple Vitamins-Calcium) .... Take 1 By Mouth Once Daily 11)  Paxil 10 Mg Tabs (Paroxetine Hcl) .Marland Kitchen.. 1 By Mouth At Bedtime 12)  Lantus 100 Unit/ml Soln (Insulin Glargine) .Marland Kitchen.. 15 Units Subcutaneously Once Daily 13)  Nu-Iron 150 Mg Caps (Polysaccharide Iron Complex) .Marland Kitchen.. 1 By Mouth Two Times A Day  Allergies (verified): No Known Drug Allergies  Past History:  Past Medical History: Reviewed history from 11/23/2009 and no changes required. CAD (ICD-414.00), reports MI in 2000, treating with PCI (stenting), NQWMI 09/2009 PVD (ICD-443.9) HYPERTENSION (ICD-401.9), malignant DYSLIPIDEMIA (ICD-272.4) INSOMNIA, CHRONIC (ICD-307.42) ANEMIA (ICD-285.9) DIABETES MELLITUS, TYPE II (ICD-250.00)  MD rooster: cards - cooper (CAD/PAD)/harwani  Review of Systems  The patient denies fever, weight loss, hoarseness, chest pain, and severe indigestion/heartburn.         complains of occ epistaxis, esp with coughing.  Physical Exam  General:  alert, well-developed, well-nourished, and cooperative to examination.   fatigued and coughing but nontoxic, dtr at side Ears:  normal pinnae bilaterally, without erythema, swelling, or tenderness to palpation. TMs clear, without effusion, or cerumen impaction. Hearing grossly normal bilaterally  Nose:  External nasal examination shows  no deformity or inflammation. Nasal mucosa are pink and moist without lesions or exudates. Mouth:  loose dentures but gums appear in good repair; mucous membranes moist, without lesions or ulcers. oropharynx clear without exudate, no erythema.  Lungs:  normal respiratory effort, no intercostal retractions or use of accessory muscles; normal breath sounds bilaterally - no crackles and no wheezes.    Heart:  normal rate, regular rhythm, no murmur, and no rub. BLE without edema.    Impression & Recommendations:  Problem # 1:  UNSPECIFIED  SINUSITIS (ICD-473.9)  a/w chronic cough, now epistaxis occ - prior CXR neg stope ACEI for now - tx emperic augmentin x 7 days and add nasal steroid for allg component - consider need for CT if symptoms unresolved  Take antibiotics for full duration. Discussed treatment options including indications for coronal CT scan of sinuses and ENT referral.   Her updated medication list for this problem includes:    Augmentin 500-125 Mg Tabs (Amoxicillin-pot clavulanate) .Marland Kitchen... 1 by mouth two times a day x 7days    Flonase 50 Mcg/act Susp (Fluticasone propionate) .Marland Kitchen... 2 sprays each nostril  every morning  Orders: Prescription Created Electronically [phone])  Complete Medication List: 1)  Metformin Hcl 1000 Mg Tabs (Metformin hcl) .... Take 1 two times a day 2)  Glipizide 5 Mg Tabs (Glipizide) .Marland Kitchen.. 1 by mouth two times a day 3)  Aspirin 325 Mg Tabs (Aspirin) .Marland Kitchen.. 1 by mouth once daily 4)  Plavix 75 Mg Tabs (Clopidogrel bisulfate) .... Take 1 by mouth once daily 5)  Coreg 6.25 Mg Tabs (Carvedilol) .Marland Kitchen.. 1 by mouth two times a day 6)  Norvasc 10 Mg Tabs (Amlodipine besylate) .Marland Kitchen.. 1 by mouth once daily 7)  Lipitor 20 Mg Tabs (Atorvastatin calcium) .... Take 1 by mouth once daily 8)  Nitrostat 0.4 Mg Subl (Nitroglycerin) .... Use prn 9)  Daily Vitamins For Women Tabs (Multiple vitamins-calcium) .... Take 1 by mouth once daily 10)  Paxil 10 Mg Tabs (Paroxetine hcl) .Marland Kitchen.. 1 by mouth at bedtime 11)  Lantus 100 Unit/ml Soln (Insulin glargine) .Marland Kitchen.. 15 units subcutaneously once daily 12)  Nu-iron 150 Mg Caps (Polysaccharide iron complex) .Marland Kitchen.. 1 by mouth two times a day 13)  Augmentin 500-125 Mg Tabs (Amoxicillin-pot clavulanate) .Marland Kitchen.. 1 by mouth two times a day x 7days 14)  Flonase 50 Mcg/act Susp (Fluticasone propionate) .... 2 sprays each nostril  every morning 15)  Lisinopril 20 Mg Tabs (Lisinopril) .... Stop until further notice - may be causing cough!  Patient Instructions: 1)  it was good to see you  today. 2)  stop lisinopril for now - 3)  augmentin antibiotics and nasal spray - your prescriptions have been electronically submitted to your pharmacy. Please take as directed. Contact our office if you believe you're having problems with the medication(s).  4)  if continued coughing after this treatment, call for re-evaluation Prescriptions: FLONASE 50 MCG/ACT SUSP (FLUTICASONE PROPIONATE) 2 sprays each nostril  every morning  #1 x 2   Entered and Authorized by:   Newt Lukes MD   Signed by:   Newt Lukes MD on 12/01/2009   Method used:   [facility]        [facility] [address] [phone]* (retail)       [address]       [place], [place]  [address]       Ph: [phone] or [phone]  Fax: [phone]   RxID:   [id number] AUGMENTIN 500-125 MG TABS (AMOXICILLIN-POT CLAVULANATE) 1 by mouth two times a day x 7days  #14 x 0   Entered and Authorized by:   Newt Lukes MD   Signed by:   Newt Lukes MD on 12/01/2009   Method used:   [facility]        [facility] [address] [phone]* (retail)       [address]       [place], [place]  [address]       Ph: [phone] or [phone]       Fax: [phone]   RxID:   [id number]

## 2010-08-24 NOTE — Assessment & Plan Note (Signed)
Summary: [facility] PT FAINTED-LB   Vital Signs:  Patient profile:   77 year old female Height:      63 inches (160.02 cm) Weight:      162.2 pounds (73.73 kg) O2 Sat:      96 % on Room air Temp:     98.8 degrees F (37.11 degrees C) oral Pulse rate:   77 / minute BP sitting:   132 / 62  (left arm) Cuff size:   regular  Vitals Entered By: Orlan Leavens (September 14, 2009 10:37 AM)  O2 Flow:  Room air CC: Hosp F/U. Daughter states metformin, glipizide  and HCTZ was stop. Had to take mom to urgent care on friday because she was'nt better. Blood sugar was over 500. Urgent care did give her some insulin Is Patient Diabetic? Yes Did you bring your meter with you today? No Pain Assessment Patient in pain? no      CBG Result 350   Primary Care Provider:  Newt Lukes MD  CC:  Hosp F/U. Daughter states metformin and glipizide  and HCTZ was stop. Had to take mom to urgent care on friday because she was'nt better. Blood sugar was over 500. Urgent care did give her some insulin.  History of Present Illness: hosp f/u - at [facility] 2/14-2/17 related to syncope - mild brady so Coreg stopped - changed to norvasc for HTN also stopped metformin d/t Cr 1.4 at admit - felt high risk d/t age - not taking glipizide? (hosp rec reviewed - actually changed xr 2.5 once daily to 5mg  two times a day)  continued pt fatigue - seen urg care 2/19 d/t feeling poorly still given insulin x 1 - not using at home coughing - dry - but +chest rattle -  unable to sleep last night because of "coughing and peeing so much" denies SOB or fever no recurrent syncope or lightheaded - no CP or palp -    Current Medications (verified): 1)  Metformin Hcl 1000 Mg Tabs (Metformin Hcl) .... Take 1 Two Times A Day 2)  Lisinopril 20 Mg Tabs (Lisinopril) .... Take 1 By Mouth Qd 3)  Plavix 75 Mg Tabs (Clopidogrel Bisulfate) .... Take 1 By Mouth Qd 4)  Lipitor 20 Mg Tabs (Atorvastatin Calcium) .... Take 1 By Mouth Qd 5)   Nitrostat 0.4 Mg Subl (Nitroglycerin) .... Use Prn 6)  Daily Vitamins For Women  Tabs (Multiple Vitamins-Calcium) .... Take 1 By Mouth Qd 7)  Glipizide 2.5 Mg Xr24h-Tab (Glipizide) .Marland Kitchen.. 1 By Mouth Two Times A Day 8)  Paxil 10 Mg Tabs (Paroxetine Hcl) .Marland Kitchen.. 1 By Mouth At Bedtime 9)  Hydrochlorothiazide 12.5 Mg Tabs (Hydrochlorothiazide) .... Take One Tablet By Mouth Daily. 10)  Carvedilol 12.5 Mg Tabs (Carvedilol) .... Take One Tablet By Mouth Twice A Day  Allergies (verified): No Known Drug Allergies  Past History:  Past Medical History: CAD (ICD-414.00), reports MI in 2000, treating with PCI (stenting) PVD (ICD-443.9) HYPERTENSION (ICD-401.9), malignant DYSLIPIDEMIA (ICD-272.4) INSOMNIA, CHRONIC (ICD-307.42) ANEMIA (ICD-285.9) DIABETES MELLITUS, TYPE II (ICD-250.00)  MD rooster: cards - cooper (CAD/PAD)  Review of Systems       The patient complains of anorexia and prolonged cough.  The patient denies fever, chest pain, peripheral edema, headaches, hemoptysis, and abdominal pain.    Physical Exam  General:  alert, well-developed, well-nourished, and cooperative to examination.   fatigued and coughing, dtr at side Ears:  normal pinnae bilaterally, without erythema, swelling, or tenderness to palpation. TMs clear, without  effusion, or cerumen impaction. Hearing grossly normal bilaterally  Mouth:  loose dentures but gums appear in good repair; mucous membranes moist, without lesions or ulcers. oropharynx clear without exudate, no erythema.  Lungs:  normal respiratory effort between cough spasms, no intercostal retractions or use of accessory muscles; normal breath sounds bilaterally - no crackles and no wheezes but few rhonchi with coughing.    Heart:  normal rate, regular rhythm, no murmur, and no rub. BLE without edema.    Impression & Recommendations:  Problem # 1:  SYNCOPE (ICD-780.2) hosp records reviewed -  now off Bbloc and to avoid dehydration (off HCTZ) no recurrent  symptoms at this time  Problem # 2:  DIABETES MELLITUS, TYPE II (ICD-250.00)  uncontrolled at this time - pt not on any DM agents despite hosp note to take glipizide 5 two times a day  new rc provided to cont same - also feel ok to resume metformin as Cr returned to normal at DC (and pt wants to take) - ?exac now also by concurrent infx--- see next - Her updated medication list for this problem includes:    Metformin Hcl 1000 Mg Tabs (Metformin hcl) .Marland Kitchen... Take 1 two times a day    Glipizide 5 Mg Tabs (Glipizide) .Marland Kitchen... 1 by mouth two times a day    Lisinopril 20 Mg Tabs (Lisinopril) .Marland Kitchen... Take 1 by mouth qd  Orders: Glucose, (CBG) [phone])  Labs Reviewed: Creat: 1.4 (06/24/2009)    Reviewed HgBA1c results: 8.5 (06/24/2009)  7.3 (03/10/2009)  Problem # 3:  ACUTE BRONCHITIS (ICD-466.0)  cough and symptoms keeping pt awake at night since dc - feels fatigued - will cover with abx for same - also cough suppressant - urged to stay hydrated - dtr to supervise until pt improved -  to go to er if not improving, esp with high CBGs.... pt/dtr agree Her updated medication list for this problem includes:    Levaquin 500 Mg Tabs (Levofloxacin) .Marland Kitchen... 1 by mouth once daily x 7 days    Hydromet 5-1.5 Mg/62ml Syrp (Hydrocodone-homatropine) .Marland Kitchen... 1 tsp by mouth every 6 hours as needed for cough - may cause sedation!  Take antibiotics and other medications as directed. Encouraged to push clear liquids, get enough rest, and take acetaminophen as needed. To be seen in 5-7 days if no improvement, sooner if worse.  Orders: Prescription Created Electronically [phone])  Problem # 4:  HYPERTENSION (ICD-401.9)  coreg and HCTZ stopped - cont norvasc - The following medications were removed from the medication list:    Hydrochlorothiazide 12.5 Mg Tabs (Hydrochlorothiazide) .Marland Kitchen... Take one tablet by mouth daily. Her updated medication list for this problem includes:    Lisinopril 20 Mg Tabs (Lisinopril) .Marland Kitchen...  Take 1 by mouth qd    Norvasc 10 Mg Tabs (Amlodipine besylate) .Marland Kitchen... 1 by mouth once daily  BP today: 132/62 Prior BP: 200/88 (09/04/2009)  Labs Reviewed: K+: 4.9 (06/24/2009) Creat: : 1.4 (06/24/2009)   Chol: 138 (03/10/2009)   HDL: 45.70 (03/10/2009)   LDL: 80 (03/10/2009)   TG: 63.0 (03/10/2009)  Complete Medication List: 1)  Metformin Hcl 1000 Mg Tabs (Metformin hcl) .... Take 1 two times a day 2)  Glipizide 5 Mg Tabs (Glipizide) .Marland Kitchen.. 1 by mouth two times a day 3)  Plavix 75 Mg Tabs (Clopidogrel bisulfate) .... Take 1 by mouth qd 4)  Lisinopril 20 Mg Tabs (Lisinopril) .... Take 1 by mouth qd 5)  Lipitor 20 Mg Tabs (Atorvastatin calcium) .... Take 1 by mouth  qd 6)  Nitrostat 0.4 Mg Subl (Nitroglycerin) .... Use prn 7)  Daily Vitamins For Women Tabs (Multiple vitamins-calcium) .... Take 1 by mouth qd 8)  Paxil 10 Mg Tabs (Paroxetine hcl) .Marland Kitchen.. 1 by mouth at bedtime 9)  Norvasc 10 Mg Tabs (Amlodipine besylate) .Marland Kitchen.. 1 by mouth once daily 10)  Levaquin 500 Mg Tabs (Levofloxacin) .Marland Kitchen.. 1 by mouth once daily x 7 days 11)  Hydromet 5-1.5 Mg/53ml Syrp (Hydrocodone-homatropine) .Marland Kitchen.. 1 tsp by mouth every 6 hours as needed for cough - may cause sedation!  Patient Instructions: 1)  it was good to see you today. 2)  medications reviewed from hospital - changes as listed below - resume metformin but stay on higher dose glipizide as ordered - 3)  continue Norvasc for blood pressure in place of coreg and HCTZ 4)  antibiotic for your cough - Levaquin for 7 days  5)  your prescriptions have been electronically submitted to your pharmacy. Please take as directed. Contact our office if you believe you're having problems with the medication(s).  6)  cough syrup provided for use at night and as needed - use robitussin during day to avaoid sedation and confusion side effects - 7)  Get plenty of rest, drink lots of clear liquids, and use Tylenol or Ibuprofen for fever and comfort. Return in 7-10 days if you're  not better:sooner if you're feeling worse. 8)  if your symptoms continue to worsen (cough, fever, etc), or if you are unable take anything by mouth (pills, fluids, etc), you should go to the emergency room for further evaluation and treatment.  Prescriptions: HYDROMET 5-1.5 MG/5ML SYRP (HYDROCODONE-HOMATROPINE) 1 tsp by mouth every 6 hours as needed for cough - may cause sedation!  #200cc x 0   Entered and Authorized by:   Newt Lukes MD   Signed by:   Newt Lukes MD on 09/14/2009   Method used:   Print then Give to Patient   RxID:   [id number] LEVAQUIN 500 MG TABS (LEVOFLOXACIN) 1 by mouth once daily x 7 days  #7 x 0   Entered and Authorized by:   Newt Lukes MD   Signed by:   Newt Lukes MD on 09/14/2009   Method used:   [facility] to        [facility] [address] [phone]* (retail)       [address]       [place], [place]  [address]       Ph: [phone] or [phone]       Fax: [phone]   RxID:   [id number] NORVASC 10 MG TABS (AMLODIPINE BESYLATE) 1 by mouth once daily  #30 x 3   Entered and Authorized by:   Newt Lukes MD   Signed by:   Newt Lukes MD on 09/14/2009   Method used:   Electronically to        [facility] [address] [phone]* (retail)       [address]       [place], [place]  [address]       Ph: [phone] or [phone]       Fax: [phone]   RxID:   [id number] GLIPIZIDE 5 MG TABS (GLIPIZIDE) 1 by mouth two times a day  #60 x 3   Entered and Authorized by:   Newt Lukes MD   Signed by:  Newt Lukes MD on 09/14/2009   Method used:   Electronically to        [facility]  [facility] [address] [phone]* (retail)       [address]       [place], [place]  [address]       Ph: [phone] or [phone]       Fax: [phone]   RxID:   [id number]   Laboratory Results   Blood Tests     CBG Random:: 350mg /dL

## 2010-08-24 NOTE — Assessment & Plan Note (Signed)
Summary: ROV   Visit Type:  Follow-up Primary Provider:  Newt Lukes MD  CC:  follow up cardiac evaluation.  History of Present Illness: 77 year-old woman, former pt of Dr Sharyn Lull, presents for eval of CAD. I have actually seen her recently for lower extremity PAD, and didn't realize at the time she wished to establish for her cardiac problems as well. She has shortness of breath with activity and is unable to walk far because of DOE. She denies chest pain. No palps or other complaints. She is planning on going on cruise in April and would like a thorough eval prior to that.  Records were reviewed today. She last underwent cardiac cath in 2007 and had stenting of a critical mid-LAD stenosis and PTCA of In-Stent restenosis in the LCx at that time. She has had no further problems, but has not had cardiac f/u.  Current Medications (verified): 1)  Metformin Hcl 1000 Mg Tabs (Metformin Hcl) .... Take 1 Two Times A Day 2)  Lisinopril 20 Mg Tabs (Lisinopril) .... Take 1 By Mouth Qd 3)  Plavix 75 Mg Tabs (Clopidogrel Bisulfate) .... Take 1 By Mouth Qd 4)  Lipitor 20 Mg Tabs (Atorvastatin Calcium) .... Take 1 By Mouth Qd 5)  Nitrostat 0.4 Mg Subl (Nitroglycerin) .... Use Prn 6)  Daily Vitamins For Women  Tabs (Multiple Vitamins-Calcium) .... Take 1 By Mouth Qd 7)  Glipizide 2.5 Mg Xr24h-Tab (Glipizide) .Marland Kitchen.. 1 By Mouth Two Times A Day 8)  Paxil 10 Mg Tabs (Paroxetine Hcl) .Marland Kitchen.. 1 By Mouth At Bedtime  Allergies (verified): No Known Drug Allergies  Past History:  Past medical history reviewed for relevance to current acute and chronic problems.  Past Medical History: CAD (ICD-414.00), reports MI in 2000, treating with PCI (stenting) PVD (ICD-443.9) ANGINA, HX OF (ICD-V12.50) HYPERTENSION (ICD-401.9), malignant DYSLIPIDEMIA (ICD-272.4) INSOMNIA, CHRONIC (ICD-307.42) ANEMIA (ICD-285.9) DIABETES MELLITUS, TYPE II (ICD-250.00) HEPATOTOXICITY, DRUG-INDUCED, RISK OF (ICD-V58.69) PERSONAL  HISTORY OF MALIGNANT NEOPLASM OF BREAST (ICD-V10.3)  Review of Systems       Positive for skin rash on feet, otherwise negative except as per HPI.  Vital Signs:  Patient profile:   77 year old female Height:      63 inches Weight:      162.25 pounds BMI:     28.85 Pulse rate:   75 / minute Pulse rhythm:   regular Resp:     18 per minute BP supine:   200 / 88  (left arm) Cuff size:   regular  Vitals Entered By: Vikki Ports (September 04, 2009 4:33 PM)  Physical Exam  General:  Pt is alert and oriented, in no acute distress. HEENT: normal Neck: normal carotid upstrokes without bruits, JVP normal Lungs: CTA CV: RRR without murmur or gallop Abd: soft, NT, positive BS, no bruit, no organomegaly Ext: no clubbing, cyanosis, or edema. peripheral pulses 2+ and equal Skin: warm and dry without rash    EKG  Procedure date:  09/04/2009  Findings:      NSR, cannot rule out anterior infarct age-undetermined, cannot rule out inferior infarct age undetermined. HR 75 bpm.  Impression & Recommendations:  Problem # 1:  HYPERTENSION (ICD-401.9) BP is uncontrolled. Recommend add carvedilol 12.5 mg two times a day and hctz 12.5 mg daily. Will check a metabolic panel in 2 weeks and f/u in 3 weeks. May need a calcium channel blocker as well, but will start with the changes above.  Her updated medication list for this problem includes:  Lisinopril 20 Mg Tabs (Lisinopril) .Marland Kitchen... Take 1 by mouth qd    Hydrochlorothiazide 12.5 Mg Tabs (Hydrochlorothiazide) .Marland Kitchen... Take one tablet by mouth daily.    Carvedilol 12.5 Mg Tabs (Carvedilol) .Marland Kitchen... Take one tablet by mouth twice a day  Orders: EKG w/ Interpretation (93000)  Problem # 2:  CAD (ICD-414.00) Exertional dyspnea is significant, but no clear angina. This is an elderly woman with diabetes, so she may have atypical symptoms or silent ischemia. Will check a myoview stress, but would like to better control BP before proceeding. Will reassess  in 3-4 weeks and likely do a stress at that time if BP better controlled.  Her updated medication list for this problem includes:    Lisinopril 20 Mg Tabs (Lisinopril) .Marland Kitchen... Take 1 by mouth qd    Plavix 75 Mg Tabs (Clopidogrel bisulfate) .Marland Kitchen... Take 1 by mouth qd    Nitrostat 0.4 Mg Subl (Nitroglycerin) ..... Use prn    Carvedilol 12.5 Mg Tabs (Carvedilol) .Marland Kitchen... Take one tablet by mouth twice a day  Orders: EKG w/ Interpretation ([address])  Patient Instructions: 1)  Your physician recommends that you schedule a follow-up appointment in: 3 WEEKS 2)  Your physician recommends that you return for lab work in: 10 days (BMP 401.9, 414.01, 786.09) 3)  Your physician has recommended you make the following change in your medication: START Carvedilol 12.5mg  one tablet by mouth twice a day, START HCTZ 12.5mg  one tablet daily 4)    Prescriptions: CARVEDILOL 12.5 MG TABS (CARVEDILOL) Take one tablet by mouth twice a day  #60 x 6   Entered by:   Julieta Gutting, RN, BSN   Authorized by:   Norva Karvonen, MD   Signed by:   Julieta Gutting, RN, BSN on 09/04/2009   Method used:   [facility]        [facility] [address] [phone]* (retail)       [address]       [place], [place]  [address]       Ph: [phone] or [phone]       Fax: [phone]   RxID:   [id number] HYDROCHLOROTHIAZIDE 12.5 MG TABS (HYDROCHLOROTHIAZIDE) Take one tablet by mouth daily.  #30 x 6   Entered by:   Julieta Gutting, RN, BSN   Authorized by:   Norva Karvonen, MD   Signed by:   Julieta Gutting, RN, BSN on 09/04/2009   Method used:   [facility] to        [facility] [address] [phone]* (retail)       [address]       [place], [place]  [address]       Ph: [phone] or [phone]       Fax: [phone]   RxID:   [id number]

## 2010-08-24 NOTE — Medication Information (Signed)
Summary: [facility]  [facility]   Imported By: Lennie Odor 05/19/2010 11:31:22  _____________________________________________________________________  External Attachment:    Type:   Image     Comment:   External Document

## 2010-08-24 NOTE — Assessment & Plan Note (Signed)
Summary: EST PT--DRY PATCHY SKIN ON FEET/KB   Vital Signs:  Patient profile:   77 year old female Height:      63 inches (160.02 cm) Weight:      161.4 pounds (73.36 kg) O2 Sat:      97 % on Room air Temp:     98.0 degrees F (36.67 degrees C) oral Pulse rate:   72 / minute BP sitting:   162 / 78  (left arm) Cuff size:   regular  Vitals Entered By: Orlan Leavens (August 19, 2009 8:58 AM)  O2 Flow:  Room air CC: Dry skin on her feet/ want ref to dermatologist Is Patient Diabetic? Yes Did you bring your meter with you today? No Pain Assessment Patient in pain? no        Primary Care Provider:  Newt Lukes MD  CC:  Dry skin on her feet/ want ref to dermatologist.  History of Present Illness: here because of dry feet and "rash" onset >1 year ago - not enlarging in size but not getting better saw vasc cards yest for same d/t concern over blood flow -was told not vascular cause  (i d/w dr. Excell Seltzer same yest) cards attempted referral to derm after discussion; derm said pt would need refer thru PCP if felt approp no pain  +a/w itch "all the time" no bleeding - no ulcers using OTC diabetic cream on it which helps some - new smaller spots of itch now on right lower leg in past 2 weeks - same as left foot began denies exposure to new meds or outdoors or lotions, soap  Clinical Review Panels:  Diabetes Management   HgBA1C:  8.5 (06/24/2009)   Creatinine:  1.4 (06/24/2009)   Last Foot Exam:  yes (03/10/2009)  CBC   WBC:  5.2 (06/24/2009)   RBC:  3.21 (06/24/2009)   Hgb:  10.5 (06/24/2009)   Hct:  30.9 (06/24/2009)   Platelets:  220.0 (06/24/2009)   MCV  96.4 (06/24/2009)   MCHC  34.1 (06/24/2009)   RDW  12.7 (06/24/2009)   PMN:  64.9 (06/24/2009)   Lymphs:  26.4 (06/24/2009)   Monos:  6.0 (06/24/2009)   Eosinophils:  2.2 (06/24/2009)   Basophil:  0.5 (06/24/2009)  Complete Metabolic Panel   Glucose:  110 (06/24/2009)   Sodium:  142 (06/24/2009)   Potassium:   4.9 (06/24/2009)   Chloride:  111 (06/24/2009)   CO2:  23 (06/24/2009)   BUN:  30 (06/24/2009)   Creatinine:  1.4 (06/24/2009)   Albumin:  3.7 (06/24/2009)   Total Protein:  7.5 (06/24/2009)   Calcium:  9.1 (06/24/2009)   Total Bili:  0.5 (06/24/2009)   Alk Phos:  145 (06/24/2009)   SGPT (ALT):  13 (06/24/2009)   SGOT (AST):  25 (06/24/2009)   Current Medications (verified): 1)  Metformin Hcl 1000 Mg Tabs (Metformin Hcl) .... Take 1 Two Times A Day 2)  Lisinopril 20 Mg Tabs (Lisinopril) .... Take 1 By Mouth Qd 3)  Plavix 75 Mg Tabs (Clopidogrel Bisulfate) .... Take 1 By Mouth Qd 4)  Lipitor 20 Mg Tabs (Atorvastatin Calcium) .... Take 1 By Mouth Qd 5)  Nitrostat 0.4 Mg Subl (Nitroglycerin) .... Use Prn 6)  Daily Vitamins For Women  Tabs (Multiple Vitamins-Calcium) .... Take 1 By Mouth Qd 7)  Glipizide 2.5 Mg Xr24h-Tab (Glipizide) .Marland Kitchen.. 1 By Mouth Two Times A Day 8)  Paxil 10 Mg Tabs (Paroxetine Hcl) .Marland Kitchen.. 1 By Mouth At Bedtime  Allergies (verified): No  Known Drug Allergies  Past History:  Past Medical History: Last updated: 06/24/2009 CAD (ICD-414.00), reports MI in 2000, treating with PCI (stenting) PVD (ICD-443.9) ANGINA, HX OF (ICD-V12.50) HYPERTENSION (ICD-401.9) DYSLIPIDEMIA (ICD-272.4) INSOMNIA, CHRONIC (ICD-307.42) ANEMIA (ICD-285.9) DIABETES MELLITUS, TYPE II (ICD-250.00) HEPATOTOXICITY, DRUG-INDUCED, RISK OF (ICD-V58.69) PERSONAL HISTORY OF MALIGNANT NEOPLASM OF BREAST (ICD-V10.3)  Review of Systems  The patient denies fever, weight loss, chest pain, and peripheral edema.    Physical Exam  General:  alert, well-developed, well-nourished, and cooperative to examination.    Lungs:  normal respiratory effort, no intercostal retractions or use of accessory muscles; normal breath sounds bilaterally - no crackles and no wheezes.    Heart:  normal rate, regular rhythm, no murmur, and no rub. BLE without edema.  Skin:  5x7 cm rounde thickened patch of darkened skin  over left achilles region and 5x9 cm irregular but thickened patch of darkened skin on dorsal left foot - superficial erosions and signs of excoriation present - also 2 subcm round nodules on right calf - darkened intact skin without blister, warmth or erythema Psych:  Oriented X3, normally interactive,fair eye contact, not anxious appearing, not depressed appearing, but not agitated.      Impression & Recommendations:  Problem # 1:  RASH AND OTHER NONSPECIFIC SKIN ERUPTION (ICD-782.1) will try steroid for inflammatory puritic component - refer to derm for further eval - ?bx given >31mos duration Her updated medication list for this problem includes:    Triamcinolone Acetonide 0.1 % Oint (Triamcinolone acetonide) .Marland Kitchen... Apply to affected skin on feet/leg three times a day as needed for itch  Orders: Dermatology Referral (Derma) Prescription Created Electronically [phone])  Complete Medication List: 1)  Metformin Hcl 1000 Mg Tabs (Metformin hcl) .... Take 1 two times a day 2)  Lisinopril 20 Mg Tabs (Lisinopril) .... Take 1 by mouth qd 3)  Plavix 75 Mg Tabs (Clopidogrel bisulfate) .... Take 1 by mouth qd 4)  Lipitor 20 Mg Tabs (Atorvastatin calcium) .... Take 1 by mouth qd 5)  Nitrostat 0.4 Mg Subl (Nitroglycerin) .... Use prn 6)  Daily Vitamins For Women Tabs (Multiple vitamins-calcium) .... Take 1 by mouth qd 7)  Glipizide 2.5 Mg Xr24h-tab (Glipizide) .Marland Kitchen.. 1 by mouth two times a day 8)  Paxil 10 Mg Tabs (Paroxetine hcl) .Marland Kitchen.. 1 by mouth at bedtime 9)  Triamcinolone Acetonide 0.1 % Oint (Triamcinolone acetonide) .... Apply to affected skin on feet/leg three times a day as needed for itch  Patient Instructions: 1)  it was good to see you today.  2)  try steroid ointment to your affected skin on left foot and right leg to help control itch symptoms - your prescription has been electronically submitted to your pharmacy. Please take as directed. Contact our office if you believe you're having  problems with the medication(s).  3)  also we'll make referral to [facility] dermatology. Our office will contact you regarding this appointment once made.  4)  Please schedule a follow-up appointment as scheduled for 3 months, or sooner if problems.  Prescriptions: PAXIL 10 MG TABS (PAROXETINE HCL) 1 by mouth at bedtime  #30 x 5   Entered and Authorized by:   Newt Lukes MD   Signed by:   Newt Lukes MD on 08/19/2009   Method used:   [facility] to        [facility] [address] [phone]* (retail)       [address]       [place], [place]  [address]       Ph: [phone] or [phone]       Fax: [phone]   RxID:   [id number] GLIPIZIDE 2.5 MG XR24H-TAB (GLIPIZIDE) 1 by mouth two times a day  #60 x 5   Entered and Authorized by:   Newt Lukes MD   Signed by:   Newt Lukes MD on 08/19/2009   Method used:   Electronically to        [facility] [address] [phone]* (retail)       [address]       [place], [place]  [address]       Ph: [phone] or [phone]       Fax: [phone]   RxID:   [id number] NITROSTAT 0.4 MG SUBL (NITROGLYCERIN) use prn  #30 x 5   Entered and Authorized by:   Newt Lukes MD   Signed by:   Newt Lukes MD on 08/19/2009   Method used:   Electronically to        [facility] [address] [phone]* (retail)       [address]       [place], [place]  [address]       Ph: [phone] or [phone]       Fax: [phone]   RxID:   [id number] LIPITOR 20 MG TABS (ATORVASTATIN CALCIUM) take 1 by mouth qd  #30 x 5   Entered and Authorized by:   Newt Lukes MD   Signed by:   Newt Lukes MD on 08/19/2009   Method used:   Electronically to        [facility] [address] [phone]* (retail)       [address]       [place], [place]  [address]       Ph: [phone] or [phone]       Fax: [phone]    RxID:   [id number] METFORMIN HCL 1000 MG TABS (METFORMIN HCL) take 1 two times a day  #60 Tablet x 5   Entered and Authorized by:   Newt Lukes MD   Signed by:   Newt Lukes MD on 08/19/2009   Method used:   Electronically to        [facility] [address] [phone]* (retail)       [address]       [place], [place]  [address]       Ph: [phone] or [phone]       Fax: [phone]   RxID:   [id number] PLAVIX 75 MG TABS (CLOPIDOGREL BISULFATE) take 1 by mouth qd  #30 x 5   Entered and Authorized by:   Newt Lukes MD   Signed by:   Newt Lukes MD on 08/19/2009   Method used:   Electronically to        [facility] [address] [phone]* (retail)       [address]       [place], [place]  [address]       Ph: [phone] or [phone]       Fax: [phone]   RxID:   [id number] LISINOPRIL 20 MG TABS (LISINOPRIL) take 1 by mouth qd  #  30 x 5   Entered and Authorized by:   Newt Lukes MD   Signed by:   Newt Lukes MD on 08/19/2009   Method used:   Electronically to        [facility]  [facility] [address] [phone]* (retail)       [address]       [place], [place]  [address]       Ph: [phone] or [phone]       Fax: [phone]   RxID:   [id number] TRIAMCINOLONE ACETONIDE 0.1 % OINT (TRIAMCINOLONE ACETONIDE) apply to affected skin on feet/leg three times a day as needed for itch  #15g x 1   Entered and Authorized by:   Newt Lukes MD   Signed by:   Newt Lukes MD on 08/19/2009   Method used:   Electronically to        [facility] [address] [phone]* (retail)       [address]       [place], [place]  [address]       Ph: [phone] or [phone]       Fax: [phone]   RxID:   [id number]

## 2010-08-24 NOTE — Medication Information (Signed)
Summary: [facility]  [facility]   Imported By: Sherian Rein 11/19/2009 10:50:44  _____________________________________________________________________  External Attachment:    Type:   Image     Comment:   External Document

## 2010-08-24 NOTE — Assessment & Plan Note (Signed)
Summary: Pulmonary/ ext f/u ov cough back on ACE   Copy to:  Newt Lukes MD Primary Provider/Referring Provider:  Newt Lukes MD  CC:  4 wk followup.  Pt states that her cough seems to be some better- still has prod cough with white sputum.Marland Kitchen  History of Present Illness: 10 yobf quit smoking in 1990 with no resp problems  January 29, 2010  1st pulmonary office eval on ace for eval of new  cough x 6 month seems worse when lie down, minimal mucoid sputum. Mild chest discomfort during cough.  rec trial off ace and on diet plus hs h2 > improved  March 01, 2010 4 wk followup.  Pt states that her cough seems to be some better- still has prod cough with white sputum.  confused with meds, daughter trying to help, uses pill organizer and apparently restarted ace 8/2 now coughing again.   no sob.  Pt denies any significant sore throat, dysphagia, itching, sneezing,  nasal congestion or excess secretions,  fever, chills, sweats, unintended wt loss, pleuritic or exertional cp, hempoptysis, change in activity tolerance  orthopnea pnd or leg swelling   Current Medications (verified): 1)  Metformin Hcl 1000 Mg Tabs (Metformin Hcl) .... Take 1 Two Times A Day 2)  Glipizide 5 Mg Tabs (Glipizide) .Marland Kitchen.. 1 By Mouth Two Times A Day 3)  Aspirin 325 Mg Tabs (Aspirin) .Marland Kitchen.. 1 By Mouth Once Daily 4)  Plavix 75 Mg Tabs (Clopidogrel Bisulfate) .... Take 1 By Mouth Once Daily 5)  Coreg 6.25 Mg Tabs (Carvedilol) .Marland Kitchen.. 1 By Mouth Two Times A Day 6)  Norvasc 10 Mg Tabs (Amlodipine Besylate) .Marland Kitchen.. 1 By Mouth Once Daily 7)  Lipitor 20 Mg Tabs (Atorvastatin Calcium) .... Take 1 By Mouth Once Daily 8)  Nitrostat 0.4 Mg Subl (Nitroglycerin) .... Use Prn 9)  Daily Vitamins For Women  Tabs (Multiple Vitamins-Calcium) .... Take 1 By Mouth Once Daily 10)  Paxil 10 Mg Tabs (Paroxetine Hcl) .Marland Kitchen.. 1 By Mouth At Bedtime 11)  Nu-Iron 150 Mg Caps (Polysaccharide Iron Complex) .Marland Kitchen.. 1 By Mouth Two Times A Day 12)  Lisinopril 20 Mg  Tabs (Lisinopril) .Marland Kitchen.. 1 Once Daily 13)  Lantus 100 Unit/ml Soln (Insulin Glargine) .Marland Kitchen.. 15 Units Daily 14)  Benicar 20 Mg  Tabs (Olmesartan Medoxomil) .... One Tablet By Mouth Daily 15)  Pepcid Ac Maximum Strength 20 Mg Tabs (Famotidine) .... One At Bedtime As Long As Still Coughing  Allergies (verified): No Known Drug Allergies  Past History:  Past Medical History: CAD (ICD-414.00), reports MI in 2000, treating with PCI (stenting), NQWMI 09/2009 PVD (ICD-443.9) HYPERTENSION (ICD-401.9), malignant      - D/c ACE January 29, 2010 for cough > better March 01, 2010  DYSLIPIDEMIA (ICD-272.4) INSOMNIA, CHRONIC (ICD-307.42) ANEMIA (ICD-285.9) DIABETES MELLITUS, TYPE II (ICD-250.00)  MD roster: cards - cooper (CAD/PAD)/harwani pulm - (wert)  Vital Signs:  Patient profile:   77 year old female Weight:      160.13 pounds O2 Sat:      98 % on Room air Temp:     98.4 degrees F oral Pulse rate:   48 / minute BP sitting:   104 / 46  (left arm)  Vitals Entered By: Vernie Murders (March 01, 2010 11:02 AM)  O2 Flow:  Room air  Physical Exam  Additional Exam:  wt 157 > 158 January 29, 2010 . 160 March 01, 2010  amb pleasant bf nad HEENT:  edentulous with nl turbinates, and orophanx. Nl external ear canals without cough reflex NECK :  without JVD/Nodes/TM/ nl carotid upstrokes bilaterally LUNGS: no acc muscle use, clear to A and P bilaterally without cough on insp or exp maneuvers CV:  RRR  no s3 or murmur or increase in P2, no edema  ABD:  soft and nontender with nl excursion in the supine position. No bruits or organomegaly, bowel sounds nl MS:  warm without deformities, calf tenderness, cyanosis or clubbing       Clinical Reports Reviewed:  CXR:  12/11/2009: CXR Results:  Comparison: Chest radiograph 11/03/2009   Findings: Stable mildly enlarged cardiac silhouette.  There is a fine interstitial pattern in the lower lobes which is unchanged from prior.  Upper lungs are clear.   No evidence of pleural fluid. No focal consolidation.  Lungs are hyperinflated.   IMPRESSION:   1.  No evidence of pneumonia or pleural effusions. 2.  Hyperinflated lungs similar to prior.  04/19/2005: CXR Results:  DG Chest 2 View. - STATUS: Final  IMAGE                                     Perform Date: 26Sep06 21:25  Ordered By: Scarlette Shorts,          Ordered Date: 26Sep06 20:56  Facility: [facility]                              Department: DG  Service Report Text  [facility] Accession Number: [id number]    Clinical Data: Cough, congestion    CHEST - 2 VIEW:    Comparison:  03/27/2005    Findings:  Stable mild cardiomegaly and hyperinflation. No focal   airspace   opacities or effusions. Visualized bony structures unremarkable.    IMPRESSION:    Stable cardiomegaly and hyperinflation. No active disease.    Read By:  Charlett Nose,  M.D.   Released By:  Charlett Nose,  M.D.   Impression & Recommendations:  Problem # 1:  COUGH (ICD-786.2)  Rechallenged with ace inadvertently and worse.  this is c/w  Classic Upper airway cough syndrome, so named because it's frequently impossible to sort out how much is  CR/sinusitis with freq throat clearing (which can be related to primary GERD)   vs  causing  secondary extra esophageal GERD from wide swings in gastric pressure that occur with throat clearing, promoting self use of mint and menthol lozenges that reduce the lower esophageal sphincter tone and exacerbate the problem further These are the same pts who not infrequently have failed to tolerate ace inhibitors,  dry powder inhalers or biphosphonates or report having reflux symptoms that don't respond to standard doses of PPI  Rec maintain off ace, f/u 6 weeks for final ov  Orders: Est. Patient Level IV ([address])  Problem # 2:  HYPERTENSION (ICD-401.9)  The following medications were removed from the medication list:    Lisinopril 20 Mg Tabs (Lisinopril) .Marland Kitchen... 1 once daily Her updated  medication list for this problem includes:    Coreg 6.25 Mg Tabs (Carvedilol) .Marland Kitchen... 1 by mouth two times a day    Norvasc 10 Mg Tabs (Amlodipine besylate) .Marland Kitchen... 1 by mouth once daily    Benicar 20 Mg Tabs (Olmesartan medoxomil) ..... One tablet by mouth daily   ACE inhibitors are problematic in  pts with airway complaints because  even experienced pulmonologists can't always distinguish ace effects from copd/asthma.  By themselves they don't actually cause a problem, much like oxygen can't by itself start a fire, but they certainly serve as a powerful catalyst or enhancer for any "fire"  or inflammatory process in the upper airway, be it caused by an ET  tube or more commonly reflux (especially in the obese or pts with known GERD or who are on biphoshonates).  In the era of ARB near equivalency until we have a better handle on the reversibility of the airway problem, it just makes sense to avoid ace entirely in the short run and then decide later, having established a level of airway control using a reasonable limited regimen, whether to add back ace but even then being very careful to observe the pt for worsening airway control and number of meds used/ needed to control symptoms.    Coreg also problematic if airways disorders still a concern since it is not a specific beta blocker and next step if not 100% improved would be trial of Bystolic, the most beta -1  selective Beta blocker available in sample form, with bisoprolol the most selective generic choice  on the market.   Orders: Est. Patient Level IV ([address])  Patient Instructions: 1)  Continue  benicar 20 mg one daily 2)  As long as coughing keep taking pepcid 20mg  one at bedtime 3)  Please schedule a follow-up appointment in 6 weeks, sooner if needed  4)  See Tammy NP with all medications to help with pill boxes

## 2010-08-24 NOTE — Progress Notes (Signed)
Summary: nos appt  Phone Note Call from Patient   Caller: [email or web address]  Call For: wert Summary of Call: LMTCB x2 to rsc nos from 9/19. Initial call taken by: Darletta Moll,  April 13, 2010 3:54 PM

## 2010-08-24 NOTE — Medication Information (Signed)
Summary: [facility]   Imported By: Lester Spiceland 12/09/2009 08:41:25  _____________________________________________________________________  External Attachment:    Type:   Image     Comment:   External Document

## 2010-08-24 NOTE — Assessment & Plan Note (Signed)
Summary: Pulmonary/ new pt eval for cough, try off ace   Visit Type:  Initial Consult Copy to:  Newt Lukes MD Primary Provider/Referring Provider:  Newt Lukes MD  CC:  Cough.  History of Present Illness: 20 yobf quit smoking in 1990 with no resp problems  January 29, 2010  1st pulmonary office eval on ace for eval of new  cough x 6 month seems worse when lie down, minimal mucoid sputum. Mild chest discomfort during cough. Pt denies any significant sore throat, dysphagia, itching, sneezing,  nasal congestion or excess secretions,  fever, chills, sweats, unintended wt loss, pleuritic or exertional cp, hempoptysis, change in activity tolerance  orthopnea pnd or leg swelling.  Pt also denies any obvious fluctuation in symptoms with weather or environmental change or other alleviating or aggravating factors.       Current Medications (verified): 1)  Metformin Hcl 1000 Mg Tabs (Metformin Hcl) .... Take 1 Two Times A Day 2)  Glipizide 5 Mg Tabs (Glipizide) .Marland Kitchen.. 1 By Mouth Two Times A Day 3)  Aspirin 325 Mg Tabs (Aspirin) .Marland Kitchen.. 1 By Mouth Once Daily 4)  Plavix 75 Mg Tabs (Clopidogrel Bisulfate) .... Take 1 By Mouth Once Daily 5)  Coreg 6.25 Mg Tabs (Carvedilol) .Marland Kitchen.. 1 By Mouth Two Times A Day 6)  Norvasc 10 Mg Tabs (Amlodipine Besylate) .Marland Kitchen.. 1 By Mouth Once Daily 7)  Lipitor 20 Mg Tabs (Atorvastatin Calcium) .... Take 1 By Mouth Once Daily 8)  Nitrostat 0.4 Mg Subl (Nitroglycerin) .... Use Prn 9)  Daily Vitamins For Women  Tabs (Multiple Vitamins-Calcium) .... Take 1 By Mouth Once Daily 10)  Paxil 10 Mg Tabs (Paroxetine Hcl) .Marland Kitchen.. 1 By Mouth At Bedtime 11)  Nu-Iron 150 Mg Caps (Polysaccharide Iron Complex) .Marland Kitchen.. 1 By Mouth Two Times A Day 12)  Lisinopril 20 Mg Tabs (Lisinopril) .Marland Kitchen.. 1 Once Daily 13)  Lantus 100 Unit/ml Soln (Insulin Glargine) .Marland Kitchen.. 15 Units Daily  Allergies (verified): No Known Drug Allergies  Past History:  Past Medical History: CAD (ICD-414.00), reports MI in  2000, treating with PCI (stenting), NQWMI 09/2009 PVD (ICD-443.9) HYPERTENSION (ICD-401.9), malignant      - D/c ACE January 29, 2010 for cough DYSLIPIDEMIA (ICD-272.4) INSOMNIA, CHRONIC (ICD-307.42) ANEMIA (ICD-285.9) DIABETES MELLITUS, TYPE II (ICD-250.00)  MD roster: cards - cooper (CAD/PAD)/harwani pulm - (Praise Dolecki)  Family History: Reviewed history from 04/04/2009 and no changes required. mom died 29s - unknown cause dad died 56's CAD, CHF and BKA hx  Social History: Former Smoker - quit 1989. Smoked for approx 20 yrs up 1 ppd. 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  The patient denies shortness of breath at rest, non-productive cough, coughing up blood, irregular heartbeats, acid heartburn, indigestion, loss of appetite, weight change, abdominal pain, difficulty swallowing, sore throat, tooth/dental problems, headaches, nasal congestion/difficulty breathing through nose, sneezing, itching, ear ache, anxiety, rash, change in color of mucus, and fever.    Vital Signs:  Patient profile:   77 year old female Weight:      156 pounds O2 Sat:      99 % on Room air Temp:     98.1 degrees F oral Pulse rate:   61 / minute BP sitting:   160 / 70  (  left arm)  Vitals Entered By: Vernie Murders (January 29, 2010 11:29 AM)  O2 Flow:  Room air  Physical Exam  Additional Exam:  wt 157 > 158 January 29, 2010 amb pleasant bf nad HEENT: edentulous with nl turbinates, and orophanx. Nl external ear canals without cough reflex NECK :  without JVD/Nodes/TM/ nl carotid upstrokes bilaterally LUNGS: no acc muscle use, clear to A and P bilaterally without cough on insp or exp maneuvers CV:  RRR  no s3 or murmur or increase in P2, no edema  ABD:  soft and nontender with nl  excursion in the supine position. No bruits or organomegaly, bowel sounds nl MS:  warm without deformities, calf tenderness, cyanosis or clubbing SKIN: warm and dry without lesions   NEURO:  alert, approp, no deficits     CXR  Procedure date:  12/11/2009  Findings:      Comparison: Chest radiograph 11/03/2009   Findings: Stable mildly enlarged cardiac silhouette.  There is a fine interstitial pattern in the lower lobes which is unchanged from prior.  Upper lungs are clear.  No evidence of pleural fluid. No focal consolidation.  Lungs are hyperinflated.   IMPRESSION:   1.  No evidence of pneumonia or pleural effusions. 2.  Hyperinflated lungs similar to prior.  Impression & Recommendations:  Problem # 1:  COUGH (ICD-786.2)  The most common causes of chronic cough in immunocompetent adults include: upper airway cough syndrome (UACS), previously referred to as postnasal drip syndrome,  caused by variety of rhinosinus conditions; (2) asthma; (3) GERD; (4) chronic bronchitis from cigarette smoking or other inhaled environmental irritants; (5) nonasthmatic eosinophilic bronchitis; and (6) bronchiectasis. These conditions, singly or in combination, have accounted for up to 94% of the causes of chronic cough in prospective studies.   This is most c/w  Classic Upper airway cough syndrome, so named because it's frequently impossible to sort out how much is  CR/sinusitis with freq throat clearing (which can be related to primary GERD)   vs  causing  secondary extra esophageal GERD from wide swings in gastric pressure that occur with throat clearing, promoting self use of mint and menthol lozenges that reduce the lower esophageal sphincter tone and exacerbate the problem further These are the same pts who not infrequently have failed to tolerate ace inhibitors,  dry powder inhalers or biphosphonates or report having reflux symptoms that don't respond to standard doses of PPI   for now try off ace  and add pepcid empirically at hs since this is when cough is worse  The standardized cough guidelines recently published in Chest are a 14 step process, not a single office visit,  and are intended  to address this problem logically,  with an alogrithm dependent on response to each progressive step  to determine a specific diagnosis with  minimal addtional testing needed. Therefore if compliance is an issue this empiric standardized approach simply won't work.   Problem # 2:  HYPERTENSION (ICD-401.9)  Her updated medication list for this problem includes:    Coreg 6.25 Mg Tabs (Carvedilol) .Marland Kitchen... 1 by mouth two times a day    Norvasc 10 Mg Tabs (Amlodipine besylate) .Marland Kitchen... 1 by mouth once daily    Lisinopril 20 Mg Tabs (Lisinopril) .Marland Kitchen... 1 once daily    Benicar 20 Mg Tabs (Olmesartan medoxomil) ..... One tablet by mouth daily   ACE inhibitors are problematic in  pts with airway complaints because  even experienced pulmonologists can't always distinguish  ace effects from copd/asthma.  By themselves they don't actually cause a problem, much like oxygen can't by itself start a fire, but they certainly serve as a powerful catalyst or enhancer for any "fire"  or inflammatory process in the upper airway, be it caused by an ET  tube or more commonly reflux (especially in the obese or pts with known GERD or who are on biphoshonates).  In the era of ARB near equivalency until we have a better handle on the reversibility of the airway problem, it just makes sense to avoid ace entirely in the short run and then decide later, having established a level of airway control using a reasonable limited regimen, whether to add back ace but even then being very careful to observe the pt for worsening airway control and number of meds used/ needed to control symptoms.    Orders: New Patient Level V (04540)  Medications Added to Medication List This Visit: 1)  Lisinopril 20 Mg Tabs (Lisinopril) .Marland Kitchen.. 1 once daily 2)   Lantus 100 Unit/ml Soln (Insulin glargine) .Marland Kitchen.. 15 units daily 3)  Benicar 20 Mg Tabs (Olmesartan medoxomil) .... One tablet by mouth daily 4)  Pepcid Ac Maximum Strength 20 Mg Tabs (Famotidine) .... One at bedtime as long as still coughing  Patient Instructions: 1)  stop lisinopril 2)  Start benicar 20 mg one daily 3)  As long as coughing keep taking pepcid 20mg  one at bedtime 4)  GERD (REFLUX)  is a common cause of respiratory symptoms. It commonly presents without heartburn and can be treated with medication, but also with lifestyle changes including avoidance of late meals, excessive alcohol, smoking cessation, and avoid fatty foods, chocolate, peppermint, colas, red wine, and acidic juices such as orange juice. NO MINT OR MENTHOL PRODUCTS SO NO COUGH DROPS  5)  USE SUGARLESS CANDY INSTEAD (jolley ranchers)  6)  NO OIL BASED VITAMINS  u   7)  Please schedule a follow-up appointment in 4 weeks, sooner if needed

## 2010-08-24 NOTE — Procedures (Signed)
Summary: [facility]  [facility]   Imported By: Lester El Rito 07/27/2009 08:43:00  _____________________________________________________________________  External Attachment:    Type:   Image     Comment:   External Document

## 2010-08-24 NOTE — Medication Information (Signed)
Summary: [facility]  [facility]   Imported By: Sherian Rein 11/17/2009 09:27:55  _____________________________________________________________________  External Attachment:    Type:   Image     Comment:   External Document

## 2010-08-24 NOTE — Medication Information (Signed)
Summary: [facility]   Imported By: Lester Canaseraga 04/27/2010 10:30:50  _____________________________________________________________________  External Attachment:    Type:   Image     Comment:   External Document

## 2010-08-24 NOTE — Assessment & Plan Note (Signed)
Summary: med cal ///kp   Copy to:  Nichole Lukes MD Primary Provider/Referring Provider:  Newt Lukes MD  CC:  new med calendar, pt brought all meds with her today, and no new complaints.  History of Present Illness: 77 yobf quit smoking in 1990 with no resp problems  January 29, 2010  1st pulmonary office eval on ace for eval of new  cough x 6 month seems worse when lie down, minimal mucoid sputum. Mild chest discomfort during cough.  rec trial off ace and on diet plus hs h2 > improved  March 01, 2010 4 wk followup.  Pt states that her cough seems to be some better- still has prod cough with white sputum.  confused with meds, daughter trying to help, uses pill organizer and apparently restarted ace 8/2 now coughing again.   no sob.     March 02, 2010--Presents for follow up and med review.  Was seen yesterday, ACE was stopped again and placed back on Benicar. We reviewed all her meds today and organized them into a med calendar. We discussed her switching her pill organizer into a divider that has 4 slots for each day. Her daughter is with her today and will help her with her meds. She has brought her lisinopril but assures me she has stopped this.  No change in cough from yesterday.   Allergies: No Known Drug Allergies  Past History:  Past Surgical History: Last updated: April 12, 2009 lumpectomy R breast '00 B cataract surg: R eye 12/00, L eye 3/10 - [place]  cardiac stent   Family History: Last updated: 2009/04/12 mom died 37s - unknown cause dad died 60's CAD, CHF and BKA hx  Social History: Last updated: 01/29/2010 Former Smoker - quit 1989. Smoked for approx 20 yrs up 1 ppd. 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  COUGH--?ace inhibitor related- ace stopped 01/2010.   MD roster: cards - cooper (CAD/PAD)/harwani pulm - (wert)  Review of Systems      See HPI  Vital Signs:  Patient profile:   77 year old female Height:      63 inches Weight:      160 pounds BMI:     28.45 O2 Sat:      92 % on Room air Temp:     98.6 degrees F oral Pulse rate:   53 / minute BP sitting:   122 / 68  (right arm) Cuff size:   regular  Vitals Entered By: Carver Fila (March 02, 2010 9:29 AM)  O2 Flow:  Room air CC: new med calendar, pt brought all meds with her today, no new complaints Comments Phone number updated Carver Fila  March 02, 2010 9:31 AM    Physical Exam  Additional Exam:  wt 157 > 158 January 29, 2010 . 160 March 01, 2010 >>160 March 02, 2010  amb pleasant bf nad HEENT: edentulous with nl turbinates, and orophanx. Nl external  ear canals without cough reflex NECK :  without JVD/Nodes/TM/ nl carotid upstrokes bilaterally LUNGS: no acc muscle use, clear to A and P bilaterally without cough on insp or exp maneuvers CV:  RRR  no s3 or murmur or increase in P2, no edema  ABD:  soft and nontender with nl excursion in the supine position. No bruits or organomegaly, bowel sounds nl MS:  warm without deformities, calf tenderness, cyanosis or clubbing       Impression & Recommendations:  Problem # 1:  COUGH (ICD-786.2)  Upper airway cough syndrome aggravated by ACE inhibitor.  Meds reviewed with pt education and computerized med calendar completed/adjusted.   remain off ace inhibitor  Orders: Est. Patient Level III (91478)  Problem # 2:  HYPERTENSION (ICD-401.9)  controlled on rx    Her updated medication list for this problem includes:    Coreg 6.25 Mg Tabs (Carvedilol) .Marland Kitchen... 1 by mouth two times a day    Norvasc 10 Mg Tabs (Amlodipine besylate) .Marland Kitchen... 1 by mouth once daily    Benicar 20 Mg Tabs (Olmesartan medoxomil) ..... One tablet by mouth daily  BP today: 122/68 Prior BP: 104/46 (03/01/2010)  Labs Reviewed: K+: 3.8 (11/04/2009) Creat: : 1.5 (01/27/2010)   Chol: 126 (10/12/2009)   HDL: 27 (10/12/2009)   LDL: 73 (10/12/2009)   TG: 130 (10/12/2009)  Orders: Est. Patient Level III (29562)  Complete Medication List: 1)  Metformin Hcl 1000 Mg Tabs (Metformin hcl) .... Take 1 two times a day 2)  Glipizide 5 Mg Tabs (Glipizide) .Marland Kitchen.. 1 by mouth two times a day 3)  Aspirin 325 Mg Tabs (Aspirin) .Marland Kitchen.. 1 by mouth once daily 4)  Plavix 75 Mg Tabs (Clopidogrel bisulfate) .... Take 1 by mouth once daily 5)  Coreg 6.25 Mg Tabs (Carvedilol) .Marland Kitchen.. 1 by mouth two times a day 6)  Norvasc 10 Mg Tabs (Amlodipine besylate) .Marland Kitchen.. 1 by mouth once daily 7)  Lipitor 20 Mg Tabs (Atorvastatin calcium) .... Take 1 by mouth once daily 8)  Nitrostat 0.4 Mg Subl (Nitroglycerin) .... Use prn 9)  Daily Vitamins For Women Tabs (Multiple vitamins-calcium) .... Take 1 by mouth once daily 10)  Paxil 10 Mg Tabs (Paroxetine hcl) .Marland Kitchen.. 1 by mouth at bedtime 11)  Nu-iron 150 Mg Caps (Polysaccharide iron complex) .Marland Kitchen.. 1 by mouth two times a day 12)  Lantus 100 Unit/ml Soln (Insulin glargine) .Marland Kitchen.. 15 units daily 13)  Benicar 20 Mg Tabs (Olmesartan medoxomil) .... One tablet by mouth daily 14)  Pepcid Ac Maximum Strength 20 Mg Tabs (Famotidine) .... One at bedtime as long as still coughing  Patient Instructions: 1)  Follow med calendar closely and bring back to each visit.  2)  follow up. Dr. Sherene Sires in 6 weeks and as needed    Immunization History:  Influenza Immunization History:    Influenza:  historical (04/24/2008)   Appended Document: med calendar update    Clinical Lists  Changes  Medications: Changed medication from LIPITOR 20 MG TABS (ATORVASTATIN CALCIUM) take 1 by mouth once daily to LIPITOR 20 MG TABS (ATORVASTATIN CALCIUM) Take 1 tab by mouth at bedtime Added new medication of HYDROCHLOROTHIAZIDE 12.5 MG CAPS (HYDROCHLOROTHIAZIDE) Take 1 capsule by mouth once a day Changed medication from PEPCID AC MAXIMUM STRENGTH 20 MG TABS (FAMOTIDINE) one at bedtime as long as still coughing to PEPCID AC MAXIMUM STRENGTH 20 MG TABS (FAMOTIDINE) one at bedtime Added new medication of UNISOM SLEEPGELS 50 MG CAPS (DIPHENHYDRAMINE HCL (SLEEP)) Take 1 tab by  mouth at bedtime as needed Changed medication from NITROSTAT 0.4 MG SUBL (NITROGLYCERIN) use prn to NITROSTAT 0.4 MG SUBL (NITROGLYCERIN) 1 under tongue every 5 minutes x3 as needed chest pain Removed medication of LANTUS 100 UNIT/ML SOLN (INSULIN GLARGINE) 15 units daily

## 2010-08-24 NOTE — Letter (Signed)
Summary: Request for medical records/[facility]  Request for medical records/[facility]   Imported By: Sherian Rein 04/12/2010 08:22:09  _____________________________________________________________________  External Attachment:    Type:   Image     Comment:   External Document

## 2010-08-24 NOTE — Medication Information (Signed)
Summary: Diabetes Supplies/[facility]  [facility]   Imported By: Sherian Rein 05/31/2010 09:26:27  _____________________________________________________________________  External Attachment:    Type:   Image     Comment:   External Document

## 2010-08-24 NOTE — Assessment & Plan Note (Signed)
Summary: elevated BS/LMB   Vital Signs:  Patient profile:   77 year old female Height:      63 inches (160.02 cm) Weight:      161.8 pounds (73.55 kg) O2 Sat:      99 % on Room air Temp:     98.0 degrees F (36.67 degrees C) oral Pulse rate:   57 / minute BP sitting:   138 / 62  (left arm) Cuff size:   regular  Vitals Entered By: Orlan Leavens (Nov 23, 2009 3:51 PM)  O2 Flow:  Room air CC: elevated BS/ Pt states she was d/c from hosp 2 weeks ago Is Patient Diabetic? Yes Did you bring your meter with you today? No Pain Assessment Patient in pain? no      CBG Result 352   Primary Care Provider:  Newt Lukes MD  CC:  elevated BS/ Pt states she was d/c from hosp 2 weeks ago.  History of Present Illness: 1) DM2 - confusion about medications -  given insulin in hosp and rx for lantus to use at home -  told to stop pills but resumed these last week becasue sugars were high w/o pills +PU/PD - no fever or CP, no abd pain or n/v/d checks cbgs sporatically at home  2) CAD - recent hosp x 2 3/20-21 and 4/11-13 for NQWMI notes and labs from hosp reviewed - no further syncopre, no CP, no edema  3) anemia - feels weak without acute change - not taking iron for > 3 mos "no refills" recent colo 06/2009 reviewed - neg  4) HTN - reports compliance with ongoing medical treatment - changes in medications from recent hosp reviewed denies adverse side effects related to current therapy.   Clinical Review Panels:  Lipid Management   Cholesterol:  126 (10/12/2009)   LDL (bad choesterol):  73 (10/12/2009)   HDL (good cholesterol):  27 (10/12/2009)   Triglycerides:  130 (10/12/2009)  Diabetes Management   HgBA1C:  9.2 (11/02/2009)   Creatinine:  0.99 (11/04/2009)   Last Foot Exam:  yes (03/10/2009)  CBC   WBC:  5.4 (10/12/2009)   RBC:  3.21 (06/24/2009)   Hgb:  9.8 (10/12/2009)   Hct:  30.9 (06/24/2009)   Platelets:  201 (10/12/2009)   MCV  96.4 (06/24/2009)   MCHC  34.1  (06/24/2009)   RDW  12.7 (06/24/2009)   PMN:  64.9 (06/24/2009)   Lymphs:  26.4 (06/24/2009)   Monos:  6.0 (06/24/2009)   Eosinophils:  2.2 (06/24/2009)   Basophil:  0.5 (06/24/2009)  Complete Metabolic Panel   Glucose:  137 (11/04/2009)   Sodium:  139 (11/04/2009)   Potassium:  3.8 (11/04/2009)   Chloride:  108 (11/04/2009)   CO2:  23 (11/04/2009)   BUN:  10 (11/04/2009)   Creatinine:  0.99 (11/04/2009)   Albumin:  3.7 (06/24/2009)   Total Protein:  7.5 (06/24/2009)   Calcium:  8.5 (11/04/2009)   Total Bili:  0.5 (06/24/2009)   Alk Phos:  145 (06/24/2009)   SGPT (ALT):  13 (06/24/2009)   SGOT (AST):  25 (06/24/2009)   -  Date:  11/04/2009    BG Random: 137    BUN: 10    Creatinine: 0.99    Sodium: 139    Potassium: 3.8    Chloride: 108    CO2 Total: 23    Calcium: 8.5  Date:  11/02/2009    TSH: 2.086    HgbA1c: 9.2  Date:  10/12/2009    Cholesterol: 126    LDL: 73    HDL: 27    Triglycerides: 742    WBC: 5.4    HGB: 9.8    PLT: 201  Current Medications (verified): 1)  Metformin Hcl 1000 Mg Tabs (Metformin Hcl) .... Take 1 Two Times A Day 2)  Glipizide 5 Mg Tabs (Glipizide) .Marland Kitchen.. 1 By Mouth Two Times A Day 3)  Plavix 75 Mg Tabs (Clopidogrel Bisulfate) .... Take 1 By Mouth Qd 4)  Lisinopril 20 Mg Tabs (Lisinopril) .... Take 1 By Mouth Qd 5)  Lipitor 20 Mg Tabs (Atorvastatin Calcium) .... Take 1 By Mouth Qd 6)  Nitrostat 0.4 Mg Subl (Nitroglycerin) .... Use Prn 7)  Daily Vitamins For Women  Tabs (Multiple Vitamins-Calcium) .... Take 1 By Mouth Qd 8)  Paxil 10 Mg Tabs (Paroxetine Hcl) .Marland Kitchen.. 1 By Mouth At Bedtime 9)  Norvasc 10 Mg Tabs (Amlodipine Besylate) .Marland Kitchen.. 1 By Mouth Once Daily 10)  Hydromet 5-1.5 Mg/49ml Syrp (Hydrocodone-Homatropine) .Marland Kitchen.. 1 Tsp By Mouth Every 6 Hours As Needed For Cough - May Cause Sedation!  Allergies (verified): No Known Drug Allergies  Past History:  Past Medical History: CAD (ICD-414.00), reports MI in 2000, treating with PCI  (stenting), NQWMI 09/2009 PVD (ICD-443.9) HYPERTENSION (ICD-401.9), malignant DYSLIPIDEMIA (ICD-272.4) INSOMNIA, CHRONIC (ICD-307.42) ANEMIA (ICD-285.9) DIABETES MELLITUS, TYPE II (ICD-250.00)  MD rooster: cards - cooper (CAD/PAD)/harwani  Review of Systems  The patient denies fever, chest pain, syncope, peripheral edema, and headaches.    Physical Exam  General:  alert, well-developed, well-nourished, and cooperative to examination.   fatigued and coughing, dtr at side Lungs:  normal respiratory effort, no intercostal retractions or use of accessory muscles; normal breath sounds bilaterally - no crackles and no wheezes.    Heart:  normal rate, regular rhythm, no murmur, and no rub. BLE without edema.  Psych:  Oriented X3, normally interactive,fair eye contact, not anxious appearing, not depressed appearing, but not agitated.      Impression & Recommendations:  Problem # 1:  DIABETES MELLITUS, TYPE II (ICD-250.00)  Her updated medication list for this problem includes:    Metformin Hcl 1000 Mg Tabs (Metformin hcl) .Marland Kitchen... Take 1 two times a day    Glipizide 5 Mg Tabs (Glipizide) .Marland Kitchen... 1 by mouth two times a day    Aspirin 325 Mg Tabs (Aspirin) .Marland Kitchen... 1 by mouth once daily    Lisinopril 20 Mg Tabs (Lisinopril) .Marland Kitchen... Take 1 by mouth once daily    Lantus 100 Unit/ml Soln (Insulin glargine) .Marland KitchenMarland KitchenMarland KitchenMarland Kitchen 15 units subcutaneously once daily  Orders: Glucose, (CBG) (59563)  uncontrolled at this time - pt not on any DM agents despite hosp instructions feel ok to resume metformin and OHA as Cr returned to normal at DC (and pt wants to take) - cont these with insulin as expect pt would do better on insulin but need to transition this for pt comfort Time spent with patient  and dtr 25 minutes, more than 50% of this time was spent counseling patient on meds for DM and need for titration and compliance; also hosp review of 09/2009 and 10/2009 stays  Labs Reviewed: Creat: 1.4 (06/24/2009)    Reviewed  HgBA1c results: 8.5 (06/24/2009)  7.3 (03/10/2009)  Problem # 2:  HYPERTENSION (ICD-401.9)  Her updated medication list for this problem includes:    Lisinopril 20 Mg Tabs (Lisinopril) .Marland Kitchen... Take 1 by mouth once daily    Coreg 6.25 Mg Tabs (Carvedilol) .Marland Kitchen... 1 by mouth two  times a day    Norvasc 10 Mg Tabs (Amlodipine besylate) .Marland Kitchen... 1 by mouth once daily  BP today: 138/62 Prior BP: 132/62 (09/14/2009)  Labs Reviewed: K+: 4.9 (06/24/2009) Creat: : 1.4 (06/24/2009)   Chol: 138 (03/10/2009)   HDL: 45.70 (03/10/2009)   LDL: 80 (03/10/2009)   TG: 63.0 (03/10/2009)  Problem # 3:  DYSLIPIDEMIA (ICD-272.4)  Her updated medication list for this problem includes:    Lipitor 20 Mg Tabs (Atorvastatin calcium) .Marland Kitchen... Take 1 by mouth once daily  Labs Reviewed: SGOT: 25 (06/24/2009)   SGPT: 13 (06/24/2009)   HDL:45.70 (03/10/2009)  LDL:80 (03/10/2009)  Chol:138 (03/10/2009)  Trig:63.0 (03/10/2009)  Problem # 4:  ANEMIA (ICD-285.9)  colo 12/14 normal- resume oral iron - new e-rx done Her updated medication list for this problem includes:    Nu-iron 150 Mg Caps (Polysaccharide iron complex) .Marland Kitchen... 1 by mouth two times a day  Hgb: 10.5 (06/24/2009)   Hct: 30.9 (06/24/2009)   Platelets: 220.0 (06/24/2009) RBC: 3.21 (06/24/2009)   RDW: 12.7 (06/24/2009)   WBC: 5.2 (06/24/2009) MCV: 96.4 (06/24/2009)   MCHC: 34.1 (06/24/2009) Ferritin: 108.2 (03/10/2009) Iron: 68 (03/10/2009)   % Sat: 21.0 (03/10/2009) B12: 291 (03/10/2009)   Folate: 11.3 (03/10/2009)   TSH: 1.10 (06/24/2009)  Orders: Prescription Created Electronically [phone])  Complete Medication List: 1)  Metformin Hcl 1000 Mg Tabs (Metformin hcl) .... Take 1 two times a day 2)  Glipizide 5 Mg Tabs (Glipizide) .Marland Kitchen.. 1 by mouth two times a day 3)  Aspirin 325 Mg Tabs (Aspirin) .Marland Kitchen.. 1 by mouth once daily 4)  Plavix 75 Mg Tabs (Clopidogrel bisulfate) .... Take 1 by mouth once daily 5)  Lisinopril 20 Mg Tabs (Lisinopril) .... Take 1 by mouth  once daily 6)  Coreg 6.25 Mg Tabs (Carvedilol) .Marland Kitchen.. 1 by mouth two times a day 7)  Norvasc 10 Mg Tabs (Amlodipine besylate) .Marland Kitchen.. 1 by mouth once daily 8)  Lipitor 20 Mg Tabs (Atorvastatin calcium) .... Take 1 by mouth once daily 9)  Nitrostat 0.4 Mg Subl (Nitroglycerin) .... Use prn 10)  Daily Vitamins For Women Tabs (Multiple vitamins-calcium) .... Take 1 by mouth once daily 11)  Paxil 10 Mg Tabs (Paroxetine hcl) .Marland Kitchen.. 1 by mouth at bedtime 12)  Lantus 100 Unit/ml Soln (Insulin glargine) .Marland Kitchen.. 15 units subcutaneously once daily 13)  Nu-iron 150 Mg Caps (Polysaccharide iron complex) .Marland Kitchen.. 1 by mouth two times a day  Patient Instructions: 1)  it was good to see you today. 2)  recent hospitalizations reviewed and medicationsupdated 3)  take Lantus 15units once daily AND diabetes pills as discussed - 4)  call if sugar over 200 or less than 100 for adjustments in medications 5)  start Nu-iron two times a day for your anemia - your prescription has been electronically submitted to your pharmacy. Please take as directed. Contact our office if you believe you're having problems with the medication(s).  6)  Please schedule a follow-up appointment in 2-3 months to review, sooner if problems.  Prescriptions: NU-IRON 150 MG CAPS (POLYSACCHARIDE IRON COMPLEX) 1 by mouth two times a day  #60 x 11   Entered and Authorized by:   Newt Lukes MD   Signed by:   Newt Lukes MD on 11/23/2009   Method used:   [facility]        [facility] [address] [phone]* (retail)       [address]       [place]  [place], [place]  [address]       Ph: [phone] or [phone]       Fax: [phone]   RxID:   [id number]

## 2010-08-24 NOTE — Medication Information (Signed)
Summary: Diabetic Support Program  Diabetic Support Program   Imported By: Lester [place] 04/20/2010 13:25:47  _____________________________________________________________________  External Attachment:    Type:   Image     Comment:   External Document

## 2010-08-24 NOTE — Progress Notes (Signed)
Summary: Nu-iron  Phone Note Outgoing Call   Call placed by: Orlan Leavens,  December 28, 2009 10:31 AM Summary of Call: Recieved fax from pharmacy req refill on Nu-iron 150mg  take 1 by mouth once daily. Md increase dosage to 1 two times a day on 11/23/09 and [facility]. Pt should  have refills. Fax back paper req reguarding info. Initial call taken by: Orlan Leavens,  December 28, 2009 10:35 AM

## 2010-08-24 NOTE — Progress Notes (Signed)
Summary: cough  Phone Note Call from Patient Call back at [facility] Phone [phone]   Caller: Caretaker [phone] Summary of Call: pt's aid called stating that pt has had persistent cough since D/C from hosp which is causing urinary incontinence. pt says that she has been treated with Prednisone for this in the past and is requesting RX. Initial call taken by: Margaret Pyle, CMA,  October 06, 2009 3:57 PM  Follow-up for Phone Call        will try 6 day pred pak but please encourage OV for recheck, esp if persisting cough despite this tx - also remind pt/family that pred will cause her sugars to go up- thanks - e-rx done Follow-up by: Newt Lukes MD,  October 06, 2009 10:37 PM  Additional Follow-up for Phone Call Additional follow up Details #1::        pt informed and will call back next week to sch f/u Additional Follow-up by: Margaret Pyle, CMA,  October 07, 2009 9:04 AM    New/Updated Medications: PREDNISONE (PAK) 5 MG TABS (PREDNISONE) as directed x 6 days Prescriptions: PREDNISONE (PAK) 5 MG TABS (PREDNISONE) as directed x 6 days  #1 x 0   Entered and Authorized by:   Newt Lukes MD   Signed by:   Newt Lukes MD on 10/06/2009   Method used:   [facility] to        [facility] [address] [phone]* (retail)       [address]       [place], [place]  [address]       Ph: [phone] or [phone]       Fax: [phone]   RxID:   [id number]

## 2010-08-26 NOTE — Assessment & Plan Note (Signed)
Summary: FU  STC   Vital Signs:  Patient profile:   77 year old female Height:      63 inches (160.02 cm) Weight:      161.4 pounds (73.36 kg) O2 Sat:      96 % on Room air Temp:     98.6 degrees F (37.00 degrees C) oral Pulse rate:   67 / minute BP sitting:   140 / 70  (left arm) Cuff size:   regular  Vitals Entered By: Orlan Leavens RMA (August 18, 2010 10:31 AM)  O2 Flow:  Room air CC: follow-up visit Is Patient Diabetic? Yes Did you bring your meter with you today? No Pain Assessment Patient in pain? no        Primary Care Provider:  Newt Lukes MD  CC:  follow-up visit.  History of Present Illness: here for f/u -  1) DM2 - variable cbgs - high >500, never <150 hx confusion about medications, esp insulin - stopped same fall 2011 due to noncompliance denies hypoglycemia symptoms or events +PU/PD - no fever or CP, no abd pain or n/v/d checks cbgs sporatically at home  2) CAD - hosp x 2 in Mar 2011 and April 2011 for NQWMI notes and labs from hosp reviewed - no further syncopre, no CP, no edema  3) anemia - continues to feel weak without change - taking iron as rx'd = no adv Se reported report colo 06/2009 reviewed - neg  4) HTN - reports compliance with ongoing medical treatment - denies adverse side effects related to current therapy.   4) dyslipidemia- reports compliance with ongoing medical treatment and no changes in medication dose or frequency. denies adverse side effects related to current therapy.   Clinical Review Panels:  Prevention   Last Mammogram:  [facility].   No specific mammographic evidence of malignancy.   (05/01/2008)   Last Colonoscopy:  Location:  [facility].   Results: Normal. (07/07/2009)  Lipid Management   Cholesterol:  126 (10/12/2009)   LDL (bad choesterol):  73 (10/12/2009)   HDL (good cholesterol):  27 (10/12/2009)   Triglycerides:  130 (10/12/2009)  Diabetes Management  HgBA1C:  12.4 (06/02/2010)   Creatinine:  1.5 (01/27/2010)   Last Foot Exam:  yes (03/10/2009)   Last Flu Vaccine:  Fluvax 3+ (04/24/2010)  CBC   WBC:  5.3 (06/02/2010)   RBC:  3.17 (06/02/2010)   Hgb:  10.2 (06/02/2010)   Hct:  30.0 (06/02/2010)   Platelets:  229.0 (06/02/2010)   MCV  94.7 (06/02/2010)   MCHC  34.0 (06/02/2010)   RDW  14.1 (06/02/2010)   PMN:  66.6 (06/02/2010)   Lymphs:  21.7 (06/02/2010)   Monos:  7.4 (06/02/2010)   Eosinophils:  3.8 (06/02/2010)   Basophil:  0.5 (06/02/2010)  Complete Metabolic Panel   Glucose:  137 (11/04/2009)   Sodium:  139 (11/04/2009)   Potassium:  3.8 (11/04/2009)   Chloride:  108 (11/04/2009)   CO2:  23 (11/04/2009)   BUN:  10 (11/04/2009)   Creatinine:  1.5 (01/27/2010)   Albumin:  3.7 (06/24/2009)   Total Protein:  7.5 (06/24/2009)   Calcium:  8.5 (11/04/2009)   Total Bili:  0.5 (06/24/2009)   Alk Phos:  145 (06/24/2009)   SGPT (ALT):  13 (06/24/2009)   SGOT (AST):  25 (06/24/2009)   Current Medications (verified): 1)  Metformin Hcl 1000 Mg Tabs (Metformin Hcl) .... Take 1 Two Times A Day 2)  Plavix 75  Mg Tabs (Clopidogrel Bisulfate) .... Take 1 By Mouth Once Daily 3)  Aspirin 325 Mg Tabs (Aspirin) .Marland Kitchen.. 1 By Mouth Once Daily 4)  Lipitor 20 Mg Tabs (Atorvastatin Calcium) .... Take 1 Tab By Mouth At Bedtime 5)  Coreg 6.25 Mg Tabs (Carvedilol) .Marland Kitchen.. 1 By Mouth Two Times A Day 6)  Hydrochlorothiazide 12.5 Mg Caps (Hydrochlorothiazide) .... Take 1 Capsule By Mouth Once A Day 7)  Norvasc 10 Mg Tabs (Amlodipine Besylate) .Marland Kitchen.. 1 By Mouth Once Daily 8)  Benicar 20 Mg  Tabs (Olmesartan Medoxomil) .... One Tablet By Mouth Daily 9)  Paxil 10 Mg Tabs (Paroxetine Hcl) .Marland Kitchen.. 1 By Mouth At Bedtime 10)  Pepcid Ac Maximum Strength 20 Mg Tabs (Famotidine) .... One At Bedtime 11)  Nu-Iron 150 Mg Caps (Polysaccharide Iron Complex) .Marland Kitchen.. 1 By Mouth Two Times A Day 12)  Daily Vitamins For Women  Tabs (Multiple Vitamins-Calcium) .... Take 1 By  Mouth Once Daily 13)  Unisom Sleepgels 50 Mg Caps (Diphenhydramine Hcl (Sleep)) .... Take 1 Tab By Mouth At Bedtime As Needed 14)  Nitrostat 0.4 Mg Subl (Nitroglycerin) .Marland Kitchen.. 1 Under Tongue Every 5 Minutes X3 As Needed Chest Pain 15)  Glipizide 5 Mg Tabs (Glipizide) .Marland Kitchen.. 1 By Mouth Two Times A Day  Allergies (verified): No Known Drug Allergies  Past History:  Past Medical History: CAD (ICD-414.00), reports MI in 2000, treating with PCI (stenting), NQWMI 09/2009 PVD  HYPERTENSION, malignant      - D/c ACE January 29, 2010 for cough > better March 01, 2010 DYSLIPIDEMIA  INSOMNIA, CHRONIC  ANEMIA  DIABETES MELLITUS, TYPE II COMPLEX MED REGIMEN--Meds reviewed with pt education and computerized med calendar completed March 02, 2010.  COUGH--?ace inhibitor related- ace stopped 01/2010.   MD roster:  cards - cooper (CAD/PAD)/harwani pulm - wert  Review of Systems  The patient denies fever, weight gain, chest pain, syncope, and headaches.    Physical Exam  General:  alert, well-developed, well-nourished, and cooperative to examination.    Lungs:  normal respiratory effort, no intercostal retractions or use of accessory muscles; normal breath sounds bilaterally - no crackles and no wheezes.    Heart:  normal rate, regular rhythm, no murmur, and no rub. BLE without edema.  Neurologic:  alert & oriented X3 and cranial nerves II-XII symetrically intact. (very mild left facial droop at rest)-  strength normal in all extremities, sensation intact to light touch, and gait normal. speech fluent without dysarthria or aphasia; follows commands with good comprehension. 2 of 3 words recalled after 3 minutes   Impression & Recommendations:  Problem # 1:  DIABETES MELLITUS, TYPE II (ICD-250.00)  Her updated medication list for this problem includes:    Metformin Hcl 1000 Mg Tabs (Metformin hcl) .Marland Kitchen... Take 1 two times a day    Aspirin 325 Mg Tabs (Aspirin) .Marland Kitchen... 1 by mouth once daily    Benicar 20  Mg Tabs (Olmesartan medoxomil) ..... One tablet by mouth daily    Glipizide 10 Mg Tabs (Glipizide) .Marland Kitchen... 1 by mouth two times a day  uncontrolled by hx - range 150-500-  hx med confusion - unable to manage insulin (or lantus) on prior attempt spring 2011 continue metformin and glipizide as Cr returned to normal  (and pt wants to take) -   increase dose SU now - new erx done consider input of endo if unable to gain control with review of meds today -  Time spent with patient 25 minutes, more than  50% of this time was spent counseling patient on meds for DM and need forcompliance; also hosp review of 09/2009 and 10/2009  Labs Reviewed: Creat: 1.5 (01/27/2010)    Reviewed HgBA1c results: 9.1 (01/27/2010)  9.2 (11/02/2009)  Orders: Prescription Created Electronically [phone])  Problem # 2:  HYPERTENSION (ICD-401.9)  Her updated medication list for this problem includes:    Coreg 6.25 Mg Tabs (Carvedilol) .Marland Kitchen... 1 by mouth two times a day    Hydrochlorothiazide 12.5 Mg Caps (Hydrochlorothiazide) .Marland Kitchen... Take 1 capsule by mouth once a day    Norvasc 10 Mg Tabs (Amlodipine besylate) .Marland Kitchen... 1 by mouth once daily    Benicar 20 Mg Tabs (Olmesartan medoxomil) ..... One tablet by mouth daily  BP today: 140/70 Prior BP: 132/60 (06/02/2010)  Labs Reviewed: K+: 3.8 (11/04/2009) Creat: : 1.5 (01/27/2010)   Chol: 126 (10/12/2009)   HDL: 27 (10/12/2009)   LDL: 73 (10/12/2009)   TG: 130 (10/12/2009)  Problem # 3:  DEPRESSION (ICD-311)  Her updated medication list for this problem includes:    Paxil 10 Mg Tabs (Paroxetine hcl) .Marland Kitchen... 1 by mouth at bedtime  likely contrib to forgetfulness - started low dose SSRI 07/2009 - doing well-   Complete Medication List: 1)  Metformin Hcl 1000 Mg Tabs (Metformin hcl) .... Take 1 two times a day 2)  Plavix 75 Mg Tabs (Clopidogrel bisulfate) .... Take 1 by mouth once daily 3)  Aspirin 325 Mg Tabs (Aspirin) .Marland Kitchen.. 1 by mouth once daily 4)  Lipitor 20 Mg Tabs  (Atorvastatin calcium) .... Take 1 tab by mouth at bedtime 5)  Coreg 6.25 Mg Tabs (Carvedilol) .Marland Kitchen.. 1 by mouth two times a day 6)  Hydrochlorothiazide 12.5 Mg Caps (Hydrochlorothiazide) .... Take 1 capsule by mouth once a day 7)  Norvasc 10 Mg Tabs (Amlodipine besylate) .Marland Kitchen.. 1 by mouth once daily 8)  Benicar 20 Mg Tabs (Olmesartan medoxomil) .... One tablet by mouth daily 9)  Paxil 10 Mg Tabs (Paroxetine hcl) .Marland Kitchen.. 1 by mouth at bedtime 10)  Pepcid Ac Maximum Strength 20 Mg Tabs (Famotidine) .... One at bedtime 11)  Nu-iron 150 Mg Caps (Polysaccharide iron complex) .Marland Kitchen.. 1 by mouth two times a day 12)  Daily Vitamins For Women Tabs (Multiple vitamins-calcium) .... Take 1 by mouth once daily 13)  Unisom Sleepgels 50 Mg Caps (Diphenhydramine hcl (sleep)) .... Take 1 tab by mouth at bedtime as needed 14)  Nitrostat 0.4 Mg Subl (Nitroglycerin) .Marland Kitchen.. 1 under tongue every 5 minutes x3 as needed chest pain 15)  Glipizide 10 Mg Tabs (Glipizide) .Marland Kitchen.. 1 by mouth two times a day  Patient Instructions: 1)  it was good to see you today. 2)  diabetes medications reviewed - no insulin but increase dose glipizide today and take both metformin with glipizide two times a day  3)  keep up walking - this is good for you! 4)  Please schedule a follow-up appointment in 2-3 months to recheck diabetes, call sooner if problems.  Prescriptions: GLIPIZIDE 10 MG TABS (GLIPIZIDE) 1 by mouth two times a day  #60 x 3   Entered and Authorized by:   Newt Lukes MD   Signed by:   Newt Lukes MD on 08/18/2010   Method used:   [facility]        [facility] [address] [phone]* (retail)       [address]       [place], [place]  [address]       Ph: [phone] or [phone]       Fax: [phone]   RxID:   [id number]    Orders Added: 1)  Est. Patient Level IV [27253] 2)  Prescription Created Electronically [phone]

## 2010-08-26 NOTE — Letter (Signed)
Summary: [facility]  [facility]   Imported By: Lester [place] 07/22/2010 11:53:05  _____________________________________________________________________  External Attachment:    Type:   Image     Comment:   External Document

## 2010-08-30 ENCOUNTER — Encounter: Payer: Self-pay | Admitting: Internal Medicine

## 2010-08-30 ENCOUNTER — Telehealth: Payer: Self-pay | Admitting: Internal Medicine

## 2010-09-01 NOTE — Assessment & Plan Note (Signed)
Summary: Pulmonary/ ext ov re cough/ med rec   Copy to:  Newt Lukes MD Primary Provider/Referring Provider:  Newt Lukes MD  CC:  Cough- some worse.  History of Present Illness: 14 yobf quit smoking in 1990 with no resp problems  January 29, 2010  1st pulmonary office eval on ace for eval of new  cough x 6 month seems worse when lie down, minimal mucoid sputum. Mild chest discomfort during cough.  rec trial off ace and on diet plus hs h2 > improved  March 01, 2010 4 wk followup.  Pt states that her cough seems to be some better- still has prod cough with white sputum.  confused with meds, daughter trying to help, uses pill organizer and apparently restarted ace 8/2 now coughing again.   no sob.   rec stop ace  March 02, 2010- ov cc  med review.  Was seen yesterday, ACE was stopped again and placed back on Benicar. We reviewed all her meds today and organized them into a med calendar. We discussed her switching her pill organizer into a divider that has 4 slots for each day. Her daughter is with her today and will help her with her meds. She has brought her lisinopril but assures me she has stopped this.  No change in cough hx.  rec use med calendar > cough resolved   August 24, 2010 ov cc cough worse again x 6 weeks, indolent onset, day > night   dry > wet.  Pt denies any significant sore throat, dysphagia, itching, sneezing,  nasal congestion or excess secretions,  fever, chills, sweats, unintended wt loss, pleuritic or exertional cp, hempoptysis, change in activity tolerance  orthopnea pnd or leg swelling Pt also denies any obvious fluctuation in symptoms with weather or environmental change or other alleviating or aggravating factors.     not sure about meds again, did not bring med calendar   Current Medications (verified): 1)  Metformin Hcl 1000 Mg Tabs (Metformin Hcl) .... Take 1 Two Times A Day 2)  Plavix 75 Mg Tabs (Clopidogrel Bisulfate) .... Take 1 By Mouth Once  Daily 3)  Aspirin 325 Mg Tabs (Aspirin) .Marland Kitchen.. 1 By Mouth Once Daily 4)  Lipitor 20 Mg Tabs (Atorvastatin Calcium) .... Take 1 Tab By Mouth At Bedtime 5)  Coreg 6.25 Mg Tabs (Carvedilol) .Marland Kitchen.. 1 By Mouth Two Times A Day 6)  Hydrochlorothiazide 12.5 Mg Caps (Hydrochlorothiazide) .... Take 1 Capsule By Mouth Once A Day 7)  Norvasc 10 Mg Tabs (Amlodipine Besylate) .Marland Kitchen.. 1 By Mouth Once Daily 8)  Benicar 20 Mg  Tabs (Olmesartan Medoxomil) .... One Tablet By Mouth Daily 9)  Paxil 10 Mg Tabs (Paroxetine Hcl) .Marland Kitchen.. 1 By Mouth At Bedtime 10)  Daily Vitamins For Women  Tabs (Multiple Vitamins-Calcium) .... Take 1 By Mouth Once Daily 11)  Unisom Sleepgels 50 Mg Caps (Diphenhydramine Hcl (Sleep)) .... Take 1 Tab By Mouth At Bedtime As Needed 12)  Nitrostat 0.4 Mg Subl (Nitroglycerin) .Marland Kitchen.. 1 Under Tongue Every 5 Minutes X3 As Needed Chest Pain 13)  Glipizide 10 Mg Tabs (Glipizide) .Marland Kitchen.. 1 By Mouth Two Times A Day  Allergies (verified): No Known Drug Allergies  Past History:  Past Medical History: CAD (ICD-414.00), reports MI in 2000, treating with PCI (stenting), NQWMI 09/2009 PVD  HYPERTENSION, malignant      - D/c ACE January 29, 2010 for cough >  better March 01, 2010 DYSLIPIDEMIA  INSOMNIA, CHRONIC  ANEMIA  DIABETES MELLITUS, TYPE II COMPLEX MED REGIMEN--Meds reviewed with pt education and computerized med calendar completed March 02, 2010.  COUGH--?ace inhibitor related- ace stopped 01/2010.   MD roster:  cards - cooper (CAD/PAD)/harwani pulm - wert  Vital Signs:  Patient profile:   77 year old female Weight:      161.25 pounds O2 Sat:      99 % on Room air Temp:     98.1 degrees F oral Pulse rate:   61 / minute BP sitting:   150 / 68  (left arm)  Vitals Entered By: Vernie Murders (August 24, 2010 11:38 AM)  O2 Flow:  Room air  Physical Exam  Additional Exam:  wt 157 > 158 January 29, 2010 . 160 March 01, 2010 >>160 March 02, 2010 > 161 August 24, 2010  amb pleasant bf nad but quite  hoarse HEENT: edentulous with nl turbinates, and orophanx. Nl external ear canals without cough reflex NECK :  without JVD/Nodes/TM/ nl carotid upstrokes bilaterally LUNGS: no acc muscle use, clear to A and P bilaterally without cough on insp or exp maneuvers CV:  RRR  no s3 or murmur or increase in P2, no edema  ABD:  soft and nontender with nl excursion in the supine position. No bruits or organomegaly, bowel sounds nl MS:  warm without deformities, calf tenderness, cyanosis or clubbing       CXR  Procedure date:  08/24/2010  Findings:      Comparison: 12/11/2009   Findings: Mild osteopenia. Midline trachea.  Mild cardiomegaly with atherosclerosis in the transverse aorta. No pleural effusion or pneumothorax.  Lower lobe predominant diffuse interstitial thickening. Clear lungs.   IMPRESSION: Cardiomegaly and chronic interstitial thickening. No acute findings.  Impression & Recommendations:  Problem # 1:  COUGH (ICD-786.2)  The most common causes of chronic cough in immunocompetent adults include: upper airway cough syndrome (UACS), previously referred to as postnasal drip syndrome,  caused by variety of rhinosinus conditions; (2) asthma; (3) GERD; (4) chronic bronchitis from cigarette smoking or other inhaled environmental irritants; (5) nonasthmatic eosinophilic bronchitis; and (6) bronchiectasis. These conditions, singly or in combination, have accounted for up to 94% of the causes of chronic cough in prospective studies.  This is most c/w  Classic Upper airway cough syndrome, so named because it's frequently impossible to sort out how much is  CR/sinusitis with freq throat clearing (which can be related to primary GERD)   vs  causing  secondary extra esophageal GERD from wide swings in gastric pressure that occur with throat clearing, promoting self use of mint and menthol lozenges that reduce the lower esophageal sphincter tone and exacerbate the problem further These are the  same pts who not infrequently have failed to tolerate ace inhibitors,  dry powder inhalers or biphosphonates or report having reflux symptoms that don't respond to standard doses of PPI  Did flare on ace, not sure yet she's really off ACE but assuming she is next step is max gerd rx  which may include stopping norvasc   Each maintenance medication was reviewed in detail including most importantly the difference between maintenance and as needed and under what circumstances the prns are to be used. See instructions for specific recommendations > desparately needs med reconciliation.  Problem # 2:  HYPERTENSION (ICD-401.9)  The following medications were removed from the medication list:    Coreg 6.25 Mg Tabs (Carvedilol) .Marland Kitchen... 1 by mouth two times a day Her  updated medication list for this problem includes:    Hydrochlorothiazide 12.5 Mg Caps (Hydrochlorothiazide) .Marland Kitchen... Take 1 capsule by mouth once a day    Norvasc 10 Mg Tabs (Amlodipine besylate) .Marland Kitchen... 1 by mouth once daily    Benicar 20 Mg Tabs (Olmesartan medoxomil) ..... One tablet by mouth daily    Bystolic 5 Mg Tabs (Nebivolol hcl) ..... One tablet daily  Since has now developed refractory cough on coreg needs trial off this and on Bystolic, the most beta -1  selective Beta blocker available in sample form, with bisoprolol the most selective generic choice  on the market.   Norvasc esp in this dose can interfere with LES, consider alternatives next ov if still coughing  Medications Added to Medication List This Visit: 1)  Bystolic 5 Mg Tabs (Nebivolol hcl) .... One tablet daily 2)  Dexilant 60 Mg Cpdr (Dexlansoprazole) .... Take  one 30-60 min before first meal of the day 3)  Pepcid 20 Mg Tabs (Famotidine) .... Take one by mouth at bedtime  Other Orders: Est. Patient Level IV (99214) T-2 View CXR (71020TC)  Patient Instructions: 1)  Stop coreg 2)  Start bystolic 5 mg one daily in am 3)  Dexilant 60 Take  one 30-60 min before  first meal of the day  4)  Pepcid 20 mg one at bedtime  5)  Delsym cough syrup as needed 6)  GERD (REFLUX)  is a common cause of respiratory symptoms. It commonly presents without heartburn and can be treated with medication, but also with lifestyle changes including avoidance of late meals, excessive alcohol, smoking cessation, and avoid fatty foods, chocolate, peppermint, colas, red wine, and acidic juices such as orange juice. NO MINT OR MENTHOL PRODUCTS SO NO COUGH DROPS  7)  USE SUGARLESS CANDY INSTEAD (jolley ranchers)  8)  NO OIL BASED VITAMINS  9)  See Tammy NP w/in 2 weeks with all your medications, even over the counter meds, separated in two separate bags, the ones you take no matter what vs the ones you stop once you feel better and take only as needed.  She will generate for you a new user friendly medication calendar that will put [place] all on the same page re: your medication use. bring pill boxes and nurse also

## 2010-09-03 ENCOUNTER — Telehealth: Payer: Self-pay | Admitting: Internal Medicine

## 2010-09-06 ENCOUNTER — Telehealth: Payer: Self-pay | Admitting: Internal Medicine

## 2010-09-08 ENCOUNTER — Encounter: Payer: Self-pay | Admitting: Adult Health

## 2010-09-08 ENCOUNTER — Encounter (INDEPENDENT_AMBULATORY_CARE_PROVIDER_SITE_OTHER): Payer: Medicare Other | Admitting: Adult Health

## 2010-09-08 DIAGNOSIS — R05 Cough: Secondary | ICD-10-CM

## 2010-09-09 NOTE — Progress Notes (Signed)
Summary: elevated BS  Phone Note Call from Patient   Caller: Patient Summary of Call: Pt call states that she took BS it was 501. Feeling a Jezewski swimmy headed, but pt states she just took 10 units of her old lantus that she had left. Wanting to know what else md recommends for her to do. Pls advise Initial call taken by: Orlan Leavens RMA,  September 03, 2010 4:22 PM  Follow-up for Phone Call        drink lots of water and keep hydrated - remember to take metformin and glipizide both two times a day - should sched OV next week to review cbgs and medications becuase we may need to resume lantus Follow-up by: Newt Lukes MD,  September 03, 2010 4:55 PM  Additional Follow-up for Phone Call Additional follow up Details #1::        Notified pt with md recommendations. Pt states will call nxt week to make appt Additional Follow-up by: Orlan Leavens RMA,  September 03, 2010 5:02 PM

## 2010-09-09 NOTE — Progress Notes (Signed)
Summary: paxil & coreg  Phone Note Refill Request Message from:  Fax from Pharmacy on August 30, 2010 11:30 AM  Refills Requested: Medication #1:  PAXIL 10 MG TABS 1 by mouth at bedtime   Last Refilled: 07/12/2010  Medication #2:  Carvedilol 6.25mg  take 1 two times a day # 60   [facility]   Method Requested: Electronic Initial call taken by: Orlan Leavens RMA,  August 30, 2010 11:31 AM  Follow-up for Phone Call        Refilled paxil, but denied carvedilol med was d/c on 08/24/10 by Dr. Sherene Sires, must see him if she want to continue. [facility] Follow-up by: Orlan Leavens RMA,  August 30, 2010 11:35 AM    Prescriptions: PAXIL 10 MG TABS (PAROXETINE HCL) 1 by mouth at bedtime  #30 Tablet x 5   Entered by:   Orlan Leavens RMA   Authorized by:   Newt Lukes MD   Signed by:   Orlan Leavens RMA on 08/30/2010   Method used:   [facility]        [facility] [address] [phone]* (retail)       [address]       [place], [place]  [address]       Ph: [phone] or [phone]       Fax: [phone]   RxID:   [id number]

## 2010-09-09 NOTE — Letter (Signed)
Summary: [facility]/[employer] [facility]  [facility]/[employer] [facility]   Imported By: Sherian Rein 09/02/2010 10:23:49  _____________________________________________________________________  External Attachment:    Type:   Image     Comment:   External Document

## 2010-09-09 NOTE — Letter (Signed)
Summary: Request for OV notes/[employer] [facility]  Request for OV notes/[employer] [facility] [employer]   Imported By: Sherian Rein 09/02/2010 10:25:28  _____________________________________________________________________  External Attachment:    Type:   Image     Comment:   External Document

## 2010-09-15 NOTE — Progress Notes (Signed)
Summary: norvasc  Phone Note Refill Request Message from:  Fax from Pharmacy on September 06, 2010 9:40 AM  Refills Requested: Medication #1:  NORVASC 10 MG TABS 1 by mouth once daily   Last Refilled: 07/12/2010  Medication #2:  Carvedilol 6.25 take 1 twice daily   Last [facility]: [facility]2010 [facility]   Method Requested: Electronic Initial call taken by: Orlan Leavens RMA,  September 06, 2010 9:41 AM  Follow-up for Phone Call        Sent norvasc. Coreg was d/c 08/2009 change to norvasc Follow-up by: Orlan Leavens RMA,  September 06, 2010 9:43 AM    Prescriptions: NORVASC 10 MG TABS (AMLODIPINE BESYLATE) 1 by mouth once daily  #30 x 5   Entered by:   Orlan Leavens RMA   Authorized by:   Newt Lukes MD   Signed by:   Orlan Leavens RMA on 09/06/2010   Method used:   [facility] to        [facility] [address] [phone]* (retail)       [address]       [place], [place]  [address]       Ph: [phone] or [phone]       Fax: [phone]   RxID:   [id number]

## 2010-09-15 NOTE — Assessment & Plan Note (Signed)
Summary: NP follow up - med calendar  Nurse Visit   Vital Signs:  Patient profile:   77 year old female Height:      63 inches Weight:      159.50 pounds BMI:     28.36 O2 Sat:      98 % on Room air Temp:     97.1 degrees F oral Pulse rate:   61 / minute BP sitting:   122 / 66  (left arm) Cuff size:   regular  Vitals Entered By: Carver Fila (September 08, 2010 9:29 AM)  O2 Flow:  Room air  Impression & Recommendations:  Problem # 1:  COUGH (ICD-786.2)  Suspect is multifactoral in nature with previous irriation to ACE inhibitor Will treat for upper airway cough syndrome triggers- GERD prevention Plan: Meds reviewed with pt education and computerized med calendar completed/adjusted.   Stop coreg -Carvedilol .  Continue on  bystolic 5 mg one daily in am--prescription sent to pharmacy.  Begin Omeprazole 20mg - Take  one 30-60 min before first meal of the day  Follow med calendar closely and bring to each visit. follow up Dr. Sherene Sires in 6-8 weeks and as needed  You need a 4 slot med box.  Orders: Est. Patient Level III (86578)  Medications Added to Medication List This Visit: 1)  Omeprazole 20 Mg Cpdr (Omeprazole) .Marland Kitchen.. 1 by mouth once daily before meal 2)  Delsym 30 Mg/29ml Lqcr (Dextromethorphan polistirex) .... 2 teaspoons by mouth every 12 hours as needed   Copy to:  Newt Lukes MD Primary Provider/Referring Provider:  Newt Lukes MD  CC:  est med calendar - pt brought all meds with her today.  no new complaints..  History of Present Illness: 77 yobf quit smoking in 1990 with no resp problems  January 29, 2010  1st pulmonary office eval on ace for eval of new  cough x 6 month seems worse when lie down, minimal mucoid sputum. Mild chest discomfort during cough.  rec trial off ace and on diet plus hs h2 > improved  March 01, 2010 4 wk followup.  Pt states that her cough seems to be some better- still has prod cough with white sputum.  confused with meds,  daughter trying to help, uses pill organizer and apparently restarted ace 8/2 now coughing again.   no sob.   rec stop ace  March 02, 2010- ov cc  med review.  Was seen yesterday, ACE was stopped again and placed back on Benicar. We reviewed all her meds today and organized them into a med calendar. We discussed her switching her pill organizer into a divider that has 4 slots for each day. Her daughter is with her today and will help her with her meds. She has brought her lisinopril but assures me she has stopped this.  No change in cough hx.  rec use med calendar > cough resolved  August 24, 2010 ov cc cough worse again x 6 weeks, indolent onset, day > night   dry > wet.    Rx >d/c coreg begin bystolic, rx dexilant and pepcid   September 08, 2010 --Presents for follow up and med review. Last visit with incresed cough,she was taken off her coreg and changed to Bystolic.Samples were given. SHe is with her CNA today. She misunderstood instructions and took both bystolic and coreg (despite written instructions- she in not illieterate).She did take dexilant samples but is now finished with them. We discussed  her treatment regimen and why her meds were changed. Her aide is going to help her with her meds including obtaining a 4 slot med divider and follow med calendar regimen. She does feel better with less coughing.. Denies chest pain,  orthopnea, hemoptysis, fever, n/v/d, edema, headache.    Patient Instructions: 1)  Stop coreg -Carvedilol .  2)  Continue on  bystolic 5 mg one daily in am--prescription sent to pharmacy.  3)  Begin Omeprazole 20mg - Take  one 30-60 min before first meal of the day  4)  Follow med calendar closely and bring to each visit. 5)  follow up Dr. Sherene Sires in 6-8 weeks and as needed  6)  You need a 4 slot med box.   Medications Prior to Update: 1)  Metformin Hcl 1000 Mg Tabs (Metformin Hcl) .... Take 1 Two Times A Day 2)  Plavix 75 Mg Tabs (Clopidogrel Bisulfate) .... Take 1 By  Mouth Once Daily 3)  Aspirin 325 Mg Tabs (Aspirin) .Marland Kitchen.. 1 By Mouth Once Daily 4)  Glipizide 10 Mg Tabs (Glipizide) .Marland Kitchen.. 1 By Mouth Two Times A Day 5)  Lipitor 20 Mg Tabs (Atorvastatin Calcium) .... Take 1 Tab By Mouth At Bedtime 6)  Bystolic 5 Mg  Tabs (Nebivolol Hcl) .... One Tablet Daily 7)  Hydrochlorothiazide 12.5 Mg Caps (Hydrochlorothiazide) .... Take 1 Capsule By Mouth Once A Day 8)  Norvasc 10 Mg Tabs (Amlodipine Besylate) .Marland Kitchen.. 1 By Mouth Once Daily 9)  Pepcid 20 Mg Tabs (Famotidine) .... Take One By Mouth At Bedtime 10)  Paxil 10 Mg Tabs (Paroxetine Hcl) .Marland Kitchen.. 1 By Mouth At Bedtime 11)  Unisom Sleepgels 50 Mg Caps (Diphenhydramine Hcl (Sleep)) .... Take 1 Tab By Mouth At Bedtime As Needed 12)  Nitrostat 0.4 Mg Subl (Nitroglycerin) .Marland Kitchen.. 1 Under Tongue Every 5 Minutes X3 As Needed Chest Pain 13)  Benicar 20 Mg  Tabs (Olmesartan Medoxomil) .... One Tablet By Mouth Daily 14)  Daily Vitamins For Women  Tabs (Multiple Vitamins-Calcium) .... Take 1 By Mouth Once Daily 15)  Dexilant 60 Mg Cpdr (Dexlansoprazole) .... Take  One 30-60 Min Before First Meal of The Day  Current Medications (verified): 1)  Metformin Hcl 1000 Mg Tabs (Metformin Hcl) .... Take 1 Two Times A Day 2)  Plavix 75 Mg Tabs (Clopidogrel Bisulfate) .... Take 1 By Mouth Once Daily 3)  Aspirin 325 Mg Tabs (Aspirin) .Marland Kitchen.. 1 By Mouth Once Daily 4)  Glipizide 10 Mg Tabs (Glipizide) .Marland Kitchen.. 1 By Mouth Two Times A Day 5)  Lipitor 20 Mg Tabs (Atorvastatin Calcium) .... Take 1 Tab By Mouth At Bedtime 6)  Bystolic 5 Mg  Tabs (Nebivolol Hcl) .... One Tablet Daily 7)  Hydrochlorothiazide 12.5 Mg Caps (Hydrochlorothiazide) .... Take 1 Capsule By Mouth Once A Day 8)  Norvasc 10 Mg Tabs (Amlodipine Besylate) .Marland Kitchen.. 1 By Mouth Once Daily 9)  Pepcid 20 Mg Tabs (Famotidine) .... Take One By Mouth At Bedtime 10)  Paxil 10 Mg Tabs (Paroxetine Hcl) .Marland Kitchen.. 1 By Mouth At Bedtime 11)  Omeprazole 20 Mg Cpdr (Omeprazole) .Marland Kitchen.. 1 By Mouth Once Daily Before  Meal 12)  Unisom Sleepgels 50 Mg Caps (Diphenhydramine Hcl (Sleep)) .... Take 1 Tab By Mouth At Bedtime As Needed 13)  Nitrostat 0.4 Mg Subl (Nitroglycerin) .Marland Kitchen.. 1 Under Tongue Every 5 Minutes X3 As Needed Chest Pain 14)  Delsym 30 Mg/70ml Lqcr (Dextromethorphan Polistirex) .... 2 Teaspoons By Mouth Every 12 Hours As Needed  Allergies (verified): No Known Drug Allergies  Past History:  Past Surgical History: Last updated: 04/30/09 lumpectomy R breast '00 B cataract surg: R eye 12/00, L eye 3/10 - [place]  cardiac stent   Family History: Last updated: 2009-04-30 mom died 30s - unknown cause dad died 79's CAD, CHF and BKA hx  Social History: Last updated: 01/29/2010 Former Smoker - quit 1989. Smoked for approx 20 yrs up 1 ppd. 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 , September 08, 2010 COUGH--?ace inhibitor related- ace stopped 01/2010.   MD roster:  cards - cooper (CAD/PAD)/harwani pulm - wert   Review of Systems      See HPI   Physical Exam  Additional Exam:  wt 157 > 158 January 29, 2010 . 160 March 01, 2010 >>160 March 02, 2010 > 161 August 24, 2010 >>159 09/08/10  amb pleasant bf nad but quite hoarse HEENT: edentulous with nl turbinates, and orophanx. Nl external ear canals  NECK :  without JVD/Nodes/TM/ nl carotid upstrokes bilaterally LUNGS: no acc muscle use, clear to A and P bilaterally without cough on insp or exp maneuvers CV:  RRR  no s3 or murmur or increase in P2, no edema    ABD:  soft and nontender with nl excursion in the supine position. No bruits or organomegaly, bowel sounds nl MS:  warm without deformities, calf tenderness, cyanosis or clubbing       Orders Added: 1)  Est. Patient Level III [11914] Prescriptions: BYSTOLIC 5 MG  TABS (NEBIVOLOL HCL) One tablet daily  #30 x 5   Entered and Authorized by:   Rubye Oaks NP   Signed by:   Kendall Arnell NP on 09/08/2010   Method used:   [facility]        [facility] [address] [phone]* (retail)       [address]       [place], [place]  [address]       Ph: [phone] or [phone]       Fax: [phone]   RxID:   [id number] OMEPRAZOLE 20 MG CPDR (OMEPRAZOLE) 1 by mouth once daily before meal  #30 x 5   Entered and Authorized by:   Rubye Oaks NP   Signed by:   Dedric Ethington NP on 09/08/2010   Method used:   [facility]ronically to        [facility] [address] [phone]* (retail)       [address]       [place], [place]  [address]       Ph: [phone] or [phone]       Fax: [phone]   RxID:   [id number]

## 2010-09-29 ENCOUNTER — Telehealth (INDEPENDENT_AMBULATORY_CARE_PROVIDER_SITE_OTHER): Payer: Self-pay | Admitting: *Deleted

## 2010-10-12 ENCOUNTER — Telehealth: Payer: Self-pay | Admitting: Internal Medicine

## 2010-10-12 LAB — GLUCOSE, CAPILLARY: Glucose-Capillary: 453 mg/dL — ABNORMAL HIGH (ref 70–99)

## 2010-10-12 NOTE — Telephone Encounter (Signed)
ATC Tamika- LMTCB

## 2010-10-12 NOTE — Progress Notes (Signed)
Summary: med list question  Phone Note Call from Patient Call back at Home Phone [phone]   Caller: caregiver tamika foust Call For: wert Summary of Call: questions re: med list/ lipitor.  Initial call taken by: Tivis Ringer, CNA,  September 29, 2010 12:53 PM  Follow-up for Phone Call        Called, spoke with pt.  States Tamika had questions regarding a medication but does not know what the question is.  States she will have Tamika call back tomorrow as she has already left for the day. Gweneth Dimitri RN  September 29, 2010 2:10 PM  Called and spoke with Mrs. Heaton and she states she is not sure why Tamika has not called [place] back yet. Pt states she could not find tamika's number for [place] to call her. Pt states Tamika will be at her home on Monday arounf noon and will be their for a couple of hours if we can call back them. pt states she may not remember to inform tamika to call us, so If we can just call back Monday. I informed pt we would try back on Monday. She verbalized understanding Carver Fila  October 01, 2010 5:11 PM   Additional Follow-up for Phone Call Additional follow up Details #1::        ATC pt's home to speak w/ Tamika- NA and no option to leave a msg, WCB. Vernie Murders  October 04, 2010 12:03 PM  ATC back- Spoke with pt and she states Tamika already left for the day and will be back 10/05/10 btwn 12 and 2 pm.  WCB then. Additional Follow-up by: Vernie Murders,  October 04, 2010 4:50 PM    Additional Follow-up for Phone Call Additional follow up Details #2::    Called again for Tamika.  Spoke with pt's son and he states that she is not in yet, but thinks that she was calling about ? about lipitor. I am thinking that they did not realiaze med is generic now and this is what the question is about.  I gave pt's son (forgot to ask his name) our number and he will have Tamika call us back Vernie Murders  October 05, 2010 12:42 PM  Called and spoke with Mrs. Vinzant and Tamika was not  there yet and states soon as she gets their she will get her to give [place] a call Carver Fila  October 06, 2010 10:09 AM   per protocol. will sign off and await tamika's call back [facility]  October 07, 2010 9:58 AM

## 2010-10-12 NOTE — Telephone Encounter (Signed)
Called and spoke with caregiver and she stated that they wanted to make sure that the lipitor is the same since this is now a generic---explained to her that it is the same med---she also stated that pt will be changing to a different pharmacy that will deliver to pts home and they will call with this info once this has been done.

## 2010-10-13 LAB — BASIC METABOLIC PANEL
BUN: 24 mg/dL — ABNORMAL HIGH (ref 6–23)
CO2: 20 mEq/L (ref 19–32)
CO2: 20 mEq/L (ref 19–32)
Calcium: 8.5 mg/dL (ref 8.4–10.5)
Chloride: 111 mEq/L (ref 96–112)
Chloride: 112 mEq/L (ref 96–112)
GFR calc Af Amer: 39 mL/min — ABNORMAL LOW (ref >60)
GFR calc non Af Amer: 38 mL/min — ABNORMAL LOW (ref >60)
GFR calc non Af Amer: 54 mL/min — ABNORMAL LOW (ref >60)
Glucose, Bld: 137 mg/dL — ABNORMAL HIGH (ref 70–99)
Glucose, Bld: 157 mg/dL — ABNORMAL HIGH (ref 70–99)
Glucose, Bld: 226 mg/dL — ABNORMAL HIGH (ref 70–99)
Potassium: 5.1 mEq/L (ref 3.5–5.1)
Potassium: 6.1 mEq/L — ABNORMAL HIGH (ref 3.5–5.1)
Sodium: 138 mEq/L (ref 135–145)
Sodium: 139 mEq/L (ref 135–145)
Sodium: 140 mEq/L (ref 135–145)

## 2010-10-13 LAB — COMPREHENSIVE METABOLIC PANEL
Albumin: 3.7 g/dL (ref 3.5–5.2)
Alkaline Phosphatase: 93 U/L (ref 39–117)
BUN: 32 mg/dL — ABNORMAL HIGH (ref 6–23)
Calcium: 8.9 mg/dL (ref 8.4–10.5)
Glucose, Bld: 151 mg/dL — ABNORMAL HIGH (ref 70–99)
Potassium: 5.5 mEq/L — ABNORMAL HIGH (ref 3.5–5.1)
Total Protein: 7.1 g/dL (ref 6.0–8.3)

## 2010-10-13 LAB — CBC
HCT: 27.2 % — ABNORMAL LOW (ref 36.0–46.0)
HCT: 28.8 % — ABNORMAL LOW (ref 36.0–46.0)
HCT: 29.1 % — ABNORMAL LOW (ref 36.0–46.0)
Hemoglobin: 9.4 g/dL — ABNORMAL LOW (ref 12.0–15.0)
Hemoglobin: 9.6 g/dL — ABNORMAL LOW (ref 12.0–15.0)
MCHC: 33.4 g/dL (ref 30.0–36.0)
MCHC: 33.6 g/dL (ref 30.0–36.0)
MCHC: 34.5 g/dL (ref 30.0–36.0)
MCV: 96.1 fL (ref 78.0–100.0)
MCV: 96.1 fL (ref 78.0–100.0)
Platelets: DECREASED 10*3/uL (ref 150–400)
RBC: 2.99 MIL/uL — ABNORMAL LOW (ref 3.87–5.11)
RBC: 3.03 MIL/uL — ABNORMAL LOW (ref 3.87–5.11)
RDW: 14.9 % (ref 11.5–15.5)
RDW: 15.4 % (ref 11.5–15.5)

## 2010-10-13 LAB — DIFFERENTIAL
Basophils Relative: 0 % (ref 0–1)
Eosinophils Absolute: 0.2 10*3/uL (ref 0.0–0.7)
Eosinophils Relative: 5 % (ref 0–5)
Monocytes Absolute: 0.5 10*3/uL (ref 0.1–1.0)
Neutro Abs: 3 10*3/uL (ref 1.7–7.7)

## 2010-10-13 LAB — GLUCOSE, CAPILLARY
Glucose-Capillary: 149 mg/dL — ABNORMAL HIGH (ref 70–99)
Glucose-Capillary: 207 mg/dL — ABNORMAL HIGH (ref 70–99)

## 2010-10-13 LAB — PROTIME-INR
INR: 1.07 (ref 0.00–1.49)
Prothrombin Time: 13.8 seconds (ref 11.6–15.2)

## 2010-10-13 LAB — POCT CARDIAC MARKERS
CKMB, poc: <1 ng/mL — ABNORMAL LOW (ref 1.0–8.0)
Myoglobin, poc: 52.3 ng/mL (ref 12–200)

## 2010-10-13 LAB — CK TOTAL AND CKMB (NOT AT ARMC)
CK, MB: 1.2 ng/mL (ref 0.3–4.0)
Relative Index: INVALID (ref 0.0–2.5)
Total CK: 49 U/L (ref 7–177)

## 2010-10-13 LAB — APTT: aPTT: 31 seconds (ref 24–37)

## 2010-10-13 LAB — HEMOGLOBIN A1C
Hgb A1c MFr Bld: 9.2 % — ABNORMAL HIGH (ref 4.6–6.1)
Mean Plasma Glucose: 217 mg/dL

## 2010-10-13 LAB — TROPONIN I: Troponin I: 0.01 ng/mL (ref 0.00–0.06)

## 2010-10-15 LAB — COMPREHENSIVE METABOLIC PANEL
ALT: 19 U/L (ref 0–35)
Alkaline Phosphatase: 130 U/L — ABNORMAL HIGH (ref 39–117)
BUN: 10 mg/dL (ref 6–23)
CO2: 22 mEq/L (ref 19–32)
Calcium: 8.7 mg/dL (ref 8.4–10.5)
GFR calc non Af Amer: 50 mL/min — ABNORMAL LOW (ref >60)
Glucose, Bld: 211 mg/dL — ABNORMAL HIGH (ref 70–99)
Sodium: 138 mEq/L (ref 135–145)

## 2010-10-15 LAB — CK TOTAL AND CKMB (NOT AT ARMC)
CK, MB: 1 ng/mL (ref 0.3–4.0)
CK, MB: 1.2 ng/mL (ref 0.3–4.0)
Relative Index: INVALID (ref 0.0–2.5)
Total CK: 60 U/L (ref 7–177)

## 2010-10-15 LAB — POCT I-STAT, CHEM 8
BUN: 19 mg/dL (ref 6–23)
Calcium, Ion: 1.16 mmol/L (ref 1.12–1.32)
Chloride: 104 meq/L (ref 96–112)
Creatinine, Ser: 1.2 mg/dL (ref 0.4–1.2)
Glucose, Bld: 377 mg/dL — ABNORMAL HIGH (ref 70–99)
HCT: 33 % — ABNORMAL LOW (ref 36.0–46.0)
Hemoglobin: 11.2 g/dL — ABNORMAL LOW (ref 12.0–15.0)
Potassium: 4.1 meq/L (ref 3.5–5.1)
Sodium: 135 meq/L (ref 135–145)
TCO2: 24 mmol/L (ref 0–100)

## 2010-10-15 LAB — GLUCOSE, CAPILLARY
Glucose-Capillary: 170 mg/dL — ABNORMAL HIGH (ref 70–99)
Glucose-Capillary: 171 mg/dL — ABNORMAL HIGH (ref 70–99)
Glucose-Capillary: 190 mg/dL — ABNORMAL HIGH (ref 70–99)
Glucose-Capillary: 218 mg/dL — ABNORMAL HIGH (ref 70–99)
Glucose-Capillary: 331 mg/dL — ABNORMAL HIGH (ref 70–99)
Glucose-Capillary: 339 mg/dL — ABNORMAL HIGH (ref 70–99)
Glucose-Capillary: 374 mg/dL — ABNORMAL HIGH (ref 70–99)

## 2010-10-15 LAB — APTT: aPTT: 29 s (ref 24–37)

## 2010-10-15 LAB — DIFFERENTIAL
Basophils Absolute: 0 K/uL (ref 0.0–0.1)
Basophils Relative: 0 % (ref 0–1)
Eosinophils Absolute: 0.1 K/uL (ref 0.0–0.7)
Eosinophils Relative: 1 % (ref 0–5)
Lymphocytes Relative: 16 % (ref 12–46)
Lymphs Abs: 1.1 K/uL (ref 0.7–4.0)
Monocytes Absolute: 0.4 K/uL (ref 0.1–1.0)
Monocytes Relative: 5 % (ref 3–12)
Neutro Abs: 5.5 K/uL (ref 1.7–7.7)
Neutrophils Relative %: 78 % — ABNORMAL HIGH (ref 43–77)

## 2010-10-15 LAB — CBC
HCT: 27.9 % — ABNORMAL LOW (ref 36.0–46.0)
HCT: 29 % — ABNORMAL LOW (ref 36.0–46.0)
HCT: 30.8 % — ABNORMAL LOW (ref 36.0–46.0)
Hemoglobin: 10.4 g/dL — ABNORMAL LOW (ref 12.0–15.0)
Hemoglobin: 9.5 g/dL — ABNORMAL LOW (ref 12.0–15.0)
Hemoglobin: 9.9 g/dL — ABNORMAL LOW (ref 12.0–15.0)
MCHC: 33.9 g/dL (ref 30.0–36.0)
MCHC: 34.1 g/dL (ref 30.0–36.0)
MCHC: 34.2 g/dL (ref 30.0–36.0)
MCV: 96 fL (ref 78.0–100.0)
MCV: 96.4 fL (ref 78.0–100.0)
Platelets: 185 K/uL (ref 150–400)
RBC: 3.02 MIL/uL — ABNORMAL LOW (ref 3.87–5.11)
RBC: 3.19 MIL/uL — ABNORMAL LOW (ref 3.87–5.11)
RDW: 14 % (ref 11.5–15.5)
RDW: 14.1 % (ref 11.5–15.5)
RDW: 14.4 % (ref 11.5–15.5)
WBC: 4.8 K/uL (ref 4.0–10.5)

## 2010-10-15 LAB — BASIC METABOLIC PANEL
CO2: 25 mEq/L (ref 19–32)
Chloride: 107 mEq/L (ref 96–112)
Glucose, Bld: 200 mg/dL — ABNORMAL HIGH (ref 70–99)
Potassium: 4.1 mEq/L (ref 3.5–5.1)
Sodium: 138 mEq/L (ref 135–145)

## 2010-10-15 LAB — PROTIME-INR
INR: 1.15 (ref 0.00–1.49)
Prothrombin Time: 14.6 seconds (ref 11.6–15.2)

## 2010-10-15 LAB — URINALYSIS, ROUTINE W REFLEX MICROSCOPIC
Glucose, UA: 250 mg/dL — AB
Nitrite: NEGATIVE
Specific Gravity, Urine: 1.019 (ref 1.005–1.030)
pH: 5.5 (ref 5.0–8.0)

## 2010-10-15 LAB — COMPREHENSIVE METABOLIC PANEL WITH GFR
ALT: 22 U/L (ref 0–35)
AST: 26 U/L (ref 0–37)
Albumin: 3.6 g/dL (ref 3.5–5.2)
Alkaline Phosphatase: 129 U/L — ABNORMAL HIGH (ref 39–117)
BUN: 23 mg/dL (ref 6–23)
CO2: 24 meq/L (ref 19–32)
Calcium: 8.6 mg/dL (ref 8.4–10.5)
Chloride: 105 meq/L (ref 96–112)
Creatinine, Ser: 1.41 mg/dL — ABNORMAL HIGH (ref 0.4–1.2)
GFR calc non Af Amer: 36 mL/min — ABNORMAL LOW
Glucose, Bld: 199 mg/dL — ABNORMAL HIGH (ref 70–99)
Potassium: 4.2 meq/L (ref 3.5–5.1)
Sodium: 134 meq/L — ABNORMAL LOW (ref 135–145)
Total Bilirubin: 0.3 mg/dL (ref 0.3–1.2)
Total Protein: 7.1 g/dL (ref 6.0–8.3)

## 2010-10-15 LAB — URINE CULTURE
Colony Count: NO GROWTH
Culture: NO GROWTH

## 2010-10-15 LAB — CROSSMATCH
Antibody Screen: POSITIVE
DAT, IgG: NEGATIVE

## 2010-10-15 LAB — T4, FREE: Free T4: 1.06 ng/dL (ref 0.80–1.80)

## 2010-10-15 LAB — POCT CARDIAC MARKERS: Troponin i, poc: <0.05 ng/mL (ref 0.00–0.09)

## 2010-10-15 LAB — TROPONIN I

## 2010-10-15 LAB — IRON AND TIBC
Iron: 36 ug/dL — ABNORMAL LOW (ref 42–135)
Saturation Ratios: 15 % — ABNORMAL LOW (ref 20–55)
TIBC: 241 ug/dL — ABNORMAL LOW (ref 250–470)
UIBC: 205 ug/dL

## 2010-10-15 LAB — RETICULOCYTES: Retic Count, Absolute: 26.5 10*3/uL (ref 19.0–186.0)

## 2010-10-15 LAB — FERRITIN: Ferritin: 127 ng/mL (ref 10–291)

## 2010-10-15 LAB — FOLATE: Folate: 8.7 ng/mL

## 2010-10-18 ENCOUNTER — Other Ambulatory Visit: Payer: Self-pay | Admitting: *Deleted

## 2010-10-18 ENCOUNTER — Encounter: Payer: Self-pay | Admitting: Internal Medicine

## 2010-10-18 LAB — POCT I-STAT, CHEM 8
Chloride: 110 mEq/L (ref 96–112)
Glucose, Bld: 314 mg/dL — ABNORMAL HIGH (ref 70–99)
HCT: 32 % — ABNORMAL LOW (ref 36.0–46.0)
Potassium: 5.7 mEq/L — ABNORMAL HIGH (ref 3.5–5.1)
Sodium: 135 mEq/L (ref 135–145)

## 2010-10-18 LAB — TROPONIN I: Troponin I: 1.22 ng/mL (ref 0.00–0.06)

## 2010-10-18 LAB — DIFFERENTIAL
Basophils Relative: 0 % (ref 0–1)
Lymphs Abs: 0.8 10*3/uL (ref 0.7–4.0)
Monocytes Absolute: 0.4 10*3/uL (ref 0.1–1.0)
Monocytes Relative: 5 % (ref 3–12)
Neutro Abs: 7.7 10*3/uL (ref 1.7–7.7)

## 2010-10-18 LAB — URINALYSIS, ROUTINE W REFLEX MICROSCOPIC
Glucose, UA: 100 mg/dL — AB
Hgb urine dipstick: NEGATIVE
Ketones, ur: 15 mg/dL — AB
Protein, ur: NEGATIVE mg/dL
pH: 5 (ref 5.0–8.0)

## 2010-10-18 LAB — CK TOTAL AND CKMB (NOT AT ARMC)
CK, MB: 10.2 ng/mL (ref 0.3–4.0)
CK, MB: 13.2 ng/mL (ref 0.3–4.0)
CK, MB: 2.9 ng/mL (ref 0.3–4.0)
CK, MB: 6.9 ng/mL (ref 0.3–4.0)
Relative Index: 11.6 — ABNORMAL HIGH (ref 0.0–2.5)
Relative Index: INVALID (ref 0.0–2.5)
Relative Index: INVALID (ref 0.0–2.5)
Total CK: 48 U/L (ref 7–177)
Total CK: 82 U/L (ref 7–177)
Total CK: 95 U/L (ref 7–177)

## 2010-10-18 LAB — CBC
HCT: 27.4 % — ABNORMAL LOW (ref 36.0–46.0)
HCT: 30.1 % — ABNORMAL LOW (ref 36.0–46.0)
Hemoglobin: 10 g/dL — ABNORMAL LOW (ref 12.0–15.0)
Hemoglobin: 10.5 g/dL — ABNORMAL LOW (ref 12.0–15.0)
Hemoglobin: 9.2 g/dL — ABNORMAL LOW (ref 12.0–15.0)
MCHC: 33.4 g/dL (ref 30.0–36.0)
MCHC: 33.9 g/dL (ref 30.0–36.0)
MCHC: 34.5 g/dL (ref 30.0–36.0)
MCV: 95.6 fL (ref 78.0–100.0)
MCV: 96 fL (ref 78.0–100.0)
Platelets: 125 10*3/uL — ABNORMAL LOW (ref 150–400)
RBC: 2.83 MIL/uL — ABNORMAL LOW (ref 3.87–5.11)
RBC: 3.13 MIL/uL — ABNORMAL LOW (ref 3.87–5.11)
RBC: 3.21 MIL/uL — ABNORMAL LOW (ref 3.87–5.11)
RDW: 13.9 % (ref 11.5–15.5)
RDW: 14.1 % (ref 11.5–15.5)
RDW: 14.6 % (ref 11.5–15.5)
WBC: 6.3 10*3/uL (ref 4.0–10.5)

## 2010-10-18 LAB — URINE MICROSCOPIC-ADD ON

## 2010-10-18 LAB — BASIC METABOLIC PANEL
BUN: 9 mg/dL (ref 6–23)
CO2: 21 mEq/L (ref 19–32)
CO2: 21 mEq/L (ref 19–32)
Calcium: 8.3 mg/dL — ABNORMAL LOW (ref 8.4–10.5)
Calcium: 8.5 mg/dL (ref 8.4–10.5)
Calcium: 8.6 mg/dL (ref 8.4–10.5)
Chloride: 109 mEq/L (ref 96–112)
Chloride: 110 mEq/L (ref 96–112)
Creatinine, Ser: 1.04 mg/dL (ref 0.4–1.2)
GFR calc Af Amer: >60 mL/min (ref >60)
GFR calc Af Amer: >60 mL/min (ref >60)
GFR calc non Af Amer: 54 mL/min — ABNORMAL LOW (ref >60)
Glucose, Bld: 293 mg/dL — ABNORMAL HIGH (ref 70–99)
Potassium: 4 mEq/L (ref 3.5–5.1)
Potassium: 4.4 mEq/L (ref 3.5–5.1)
Sodium: 138 mEq/L (ref 135–145)
Sodium: 138 mEq/L (ref 135–145)

## 2010-10-18 LAB — CARDIAC PANEL(CRET KIN+CKTOT+MB+TROPI)
CK, MB: 2.6 ng/mL (ref 0.3–4.0)
Total CK: 50 U/L (ref 7–177)
Troponin I: 0.91 ng/mL (ref 0.00–0.06)

## 2010-10-18 LAB — GLUCOSE, CAPILLARY
Glucose-Capillary: 115 mg/dL — ABNORMAL HIGH (ref 70–99)
Glucose-Capillary: 158 mg/dL — ABNORMAL HIGH (ref 70–99)
Glucose-Capillary: 193 mg/dL — ABNORMAL HIGH (ref 70–99)
Glucose-Capillary: 197 mg/dL — ABNORMAL HIGH (ref 70–99)
Glucose-Capillary: 29 mg/dL — CL (ref 70–99)
Glucose-Capillary: 292 mg/dL — ABNORMAL HIGH (ref 70–99)
Glucose-Capillary: 302 mg/dL — ABNORMAL HIGH (ref 70–99)

## 2010-10-18 LAB — LIPID PANEL
LDL Cholesterol: 73 mg/dL (ref 0–99)
VLDL: 26 mg/dL (ref 0–40)

## 2010-10-18 LAB — HEMOCCULT GUIAC POC 1CARD (OFFICE): Fecal Occult Bld: NEGATIVE

## 2010-10-18 LAB — POCT CARDIAC MARKERS
CKMB, poc: 13.4 ng/mL (ref 1.0–8.0)
Troponin i, poc: 2.01 ng/mL (ref 0.00–0.09)

## 2010-10-18 LAB — HEPARIN LEVEL (UNFRACTIONATED)
Heparin Unfractionated: 0.13 IU/mL — ABNORMAL LOW (ref 0.30–0.70)
Heparin Unfractionated: 1 IU/mL — ABNORMAL HIGH (ref 0.30–0.70)

## 2010-10-18 LAB — TYPE AND SCREEN
ABO/RH(D): O POS
Antibody Screen: POSITIVE

## 2010-10-20 ENCOUNTER — Inpatient Hospital Stay (INDEPENDENT_AMBULATORY_CARE_PROVIDER_SITE_OTHER)
Admission: RE | Admit: 2010-10-20 | Discharge: 2010-10-20 | Disposition: A | Discharge status: Home / Self Care | Payer: Medicare Other | Source: Ambulatory Visit | Attending: Emergency Medicine | Admitting: Emergency Medicine

## 2010-10-20 ENCOUNTER — Ambulatory Visit: Payer: Medicare Other | Admitting: Internal Medicine

## 2010-10-20 DIAGNOSIS — E119 Type 2 diabetes mellitus without complications: Secondary | ICD-10-CM

## 2010-10-20 DIAGNOSIS — R32 Unspecified urinary incontinence: Secondary | ICD-10-CM

## 2010-10-20 LAB — DIFFERENTIAL
Eosinophils Absolute: 0.3 10*3/uL (ref 0.0–0.7)
Lymphs Abs: 1.9 10*3/uL (ref 0.7–4.0)
Neutro Abs: 3.2 10*3/uL (ref 1.7–7.7)
Neutrophils Relative %: 56 % (ref 43–77)

## 2010-10-20 LAB — POCT I-STAT, CHEM 8
BUN: 27 mg/dL — ABNORMAL HIGH (ref 6–23)
Chloride: 101 mEq/L (ref 96–112)
Creatinine, Ser: 1.8 mg/dL — ABNORMAL HIGH (ref 0.4–1.2)
Potassium: 5 mEq/L (ref 3.5–5.1)
Sodium: 134 mEq/L — ABNORMAL LOW (ref 135–145)
TCO2: 24 mmol/L (ref 0–100)

## 2010-10-20 LAB — POCT URINALYSIS DIP (DEVICE)
Bilirubin Urine: NEGATIVE
Glucose, UA: 500 mg/dL — AB
Ketones, ur: NEGATIVE mg/dL
Protein, ur: NEGATIVE mg/dL
Specific Gravity, Urine: 1.01 (ref 1.005–1.030)

## 2010-10-20 LAB — GLUCOSE, CAPILLARY: Glucose-Capillary: 532 mg/dL — ABNORMAL HIGH (ref 70–99)

## 2010-10-20 LAB — CBC
Hemoglobin: 11.9 g/dL — ABNORMAL LOW (ref 12.0–15.0)
MCV: 89.8 fL (ref 78.0–100.0)
Platelets: 238 10*3/uL (ref 150–400)
RBC: 3.82 MIL/uL — ABNORMAL LOW (ref 3.87–5.11)
WBC: 5.8 10*3/uL (ref 4.0–10.5)

## 2010-10-25 ENCOUNTER — Ambulatory Visit (INDEPENDENT_AMBULATORY_CARE_PROVIDER_SITE_OTHER): Payer: Medicare Other | Admitting: Internal Medicine

## 2010-10-25 ENCOUNTER — Encounter: Payer: Self-pay | Admitting: Internal Medicine

## 2010-10-25 DIAGNOSIS — R413 Other amnesia: Secondary | ICD-10-CM

## 2010-10-25 DIAGNOSIS — E119 Type 2 diabetes mellitus without complications: Secondary | ICD-10-CM

## 2010-10-25 MED ORDER — INSULIN GLARGINE 100 UNIT/ML ~~LOC~~ SOLN: 30.0000 [IU] | Freq: Every day | SUBCUTANEOUS | Status: DC

## 2010-10-25 MED ORDER — DONEPEZIL HCL 5 MG PO TABS: 5.0000 mg | ORAL_TABLET | Freq: Every day | ORAL | Status: DC

## 2010-10-25 NOTE — Assessment & Plan Note (Addendum)
Progressive distractibility and forgetfulness -  Prior labs unremarkable -Normal TSH and b12 MRI brain 08/2009 - meningioma and SVD (reveiwed) Lab Results  Component Value Date   VITAMINB12 452 09/08/2009   Start aricept - 6-8 weeks f/u

## 2010-10-25 NOTE — Progress Notes (Signed)
  Subjective:    Patient ID: [name], female    DOB: 1929-05-19, 77 y.o.   MRN: [id number]  HPI Here for ER follow up - seen 10/20/10 for uncontrolled DM Now on lantus again and reports compliance Long hx uncontrolled DM2 - variable cbgs - high >500, never <150; now in 200s Ongoing confusion about medications, esp insulin - stopped same fall 2011 due to noncompliance denies hypoglycemia symptoms or events; +PU/PD - no fever or CP, no abd pain or n/v/d checks cbgs sporatically at home Aide lays out meds into pill box "but i forget to take them"  Reviewed chronic med issues alos:  CAD - hosp x 2 in Mar 2011 and April 2011 for NQWMI notes and labs from hosp reviewed - no further syncopre, no CP, no edema  anemia - continues to feel weak without change - taking iron as rx'd = no adv Se reported report colo 06/2009 reviewed - neg  HTN - reports compliance with ongoing medical treatment - but unable to explain what meds are taken for this dx. denies adverse side effects related to current therapy.   dyslipidemia- reports compliance with ongoing medical treatment and no changes in medication dose or frequency.  denies adverse side effects related to current therapy.   Past Medical History  Diagnosis Date  . DIABETES MELLITUS, TYPE II 03/09/2009  . DYSLIPIDEMIA 03/10/2009  . ANEMIA 03/10/2009  . INSOMNIA, CHRONIC 03/25/2009  . DEPRESSION 06/24/2009  . HYPERTENSION 03/09/2009  . CAD 03/10/2009  . PVD 03/10/2009  . Memory loss 06/24/2009  . Personal history of malignant neoplasm of breast 03/10/2009   Review of Systems  Constitutional: Negative for unexpected weight change.  Respiratory: Negative for shortness of breath.   Cardiovascular: Negative for chest pain.  Neurological: Negative for headaches.      Objective:   Physical Exam BP 132/70  Pulse 57  Temp(Src) 98.4 F (36.9 C) (Oral)  Ht 5\' 3"  (1.6 m)  Wt 154 lb 1.9 oz (69.908 kg)  BMI 27.30 kg/m2 Physical Exam    Constitutional: She is oriented to person, place, and time. She appears well-developed and well-nourished. No distress. G-dtr at side Cardiovascular: Normal rate, regular rhythm and normal heart sounds.  No murmur heard. Pulmonary/Chest: Effort normal and breath sounds normal. No respiratory distress. She has no wheezes.  Neurological: She is alert and oriented to person, place, but not time; distracted and forgetful - 0 out of 3 recall at 3 minutes. No cranial nerve deficit. Coordination normal.  Skin: Skin is warm and dry. No rash noted. No erythema.  Psychiatric: She has a normal mood and affect. Her behavior is normal. Judgment and thought content slightly impaired.      Assessment & Plan:  See problem list. Medications and labs reviewed today. Increase lantus titration and start aricept

## 2010-10-25 NOTE — Patient Instructions (Signed)
It was good to see you today. Urgent care visit reviewed Increase Lantus dose to 30units and Stop glipizide for diabetes Start aricept for memory Your prescription(s) have been submitted to your pharmacy. Please take as directed and contact our office if you believe you are having problem(s) with the medication(s). Have your aide call if sugars over 200 or other questions/problems Please schedule followup in 6-8 weeks, call sooner if problems.

## 2010-10-25 NOTE — Assessment & Plan Note (Signed)
Uncontrolled complicated by noncompliance even with aide laying out meds in pill box Back on Lantus after UC eval 10/20/10 - labs/meds reviewed -  continue titration now and stop OHA in place of more insulin -  Lab Results  Component Value Date   HGBA1C 12.4* 06/02/2010

## 2010-10-26 ENCOUNTER — Other Ambulatory Visit: Payer: Self-pay | Admitting: Internal Medicine

## 2010-10-28 ENCOUNTER — Ambulatory Visit: Payer: Medicare Other | Admitting: Internal Medicine

## 2010-11-01 LAB — BASIC METABOLIC PANEL
CO2: 21 mEq/L (ref 19–32)
Calcium: 8.8 mg/dL (ref 8.4–10.5)
GFR calc non Af Amer: 32 mL/min — ABNORMAL LOW (ref >60)
Glucose, Bld: 239 mg/dL — ABNORMAL HIGH (ref 70–99)

## 2010-11-01 LAB — URINALYSIS, ROUTINE W REFLEX MICROSCOPIC
Ketones, ur: NEGATIVE mg/dL
Nitrite: NEGATIVE
Specific Gravity, Urine: 1.019 (ref 1.005–1.030)
pH: 5 (ref 5.0–8.0)

## 2010-11-01 LAB — HEPATIC FUNCTION PANEL
ALT: 14 U/L (ref 0–35)
AST: 25 U/L (ref 0–37)
Albumin: 3.6 g/dL (ref 3.5–5.2)
Alkaline Phosphatase: 102 U/L (ref 39–117)
Total Protein: 7.1 g/dL (ref 6.0–8.3)

## 2010-11-01 LAB — DIFFERENTIAL
Basophils Absolute: 0 10*3/uL (ref 0.0–0.1)
Eosinophils Relative: 0 % (ref 0–5)
Lymphocytes Relative: 3 % — ABNORMAL LOW (ref 12–46)
Lymphs Abs: 0.2 10*3/uL — ABNORMAL LOW (ref 0.7–4.0)
Neutro Abs: 6.8 10*3/uL (ref 1.7–7.7)

## 2010-11-01 LAB — URINE MICROSCOPIC-ADD ON

## 2010-11-01 LAB — CBC
HCT: 28.5 % — ABNORMAL LOW (ref 36.0–46.0)
Hemoglobin: 9.7 g/dL — ABNORMAL LOW (ref 12.0–15.0)
MCHC: 34 g/dL (ref 30.0–36.0)
RBC: 2.98 MIL/uL — ABNORMAL LOW (ref 3.87–5.11)

## 2010-11-30 ENCOUNTER — Telehealth: Payer: Self-pay | Admitting: *Deleted

## 2010-11-30 NOTE — Telephone Encounter (Signed)
PA requested for Lantus 100units/mL PA form requested from [facility] @ [phone]  Form recvd 11/17/2010 Completed form faxed for Approval 11/19/10 Approval recvd & faxed to pharmacy 11/25/10  2nd PA recvd from pharmacy stating "Lantus not covered on Insurance-please advise" 2nd Fax w/Approval from [facility] w/contact info, if any questions [underlined] to pharmacy [phone]

## 2010-12-10 NOTE — Cardiovascular Report (Signed)
[name], [name]                 ACCOUNT NO.:  [id number]   MEDICAL RECORD NO.:  [id number]          PATIENT TYPE:  OIB   LOCATION:  1961                         FACILITY:  [facility]   PHYSICIAN:  Mohan N. Sharyn Lull, M.D. DATE OF BIRTH:  12/24/1928   DATE OF PROCEDURE:  11/24/2005  DATE OF DISCHARGE:  11/24/2005                              CARDIAC CATHETERIZATION   PROCEDURE:  Left cardiac cath with selective left and right coronary  angiography, left ventriculography via right groin using Judkins technique.   INDICATIONS FOR PROCEDURE:  [name] is a 77 year old black female with  past medical history significant for coronary artery disease, history of  inferior wall MI in 2001, status post emergency PCI to distal RCA,  subsequently was noted to have two vessel disease requiring PCI to distal  left circumflex and OM2 in January 2003, hypertension, non-insulin-dependent  diabetes mellitus, hypercholesteremia, history of tobacco abuse, chronic  anemia, history of vaginal bleeding in the past, history of CA of breast,  complains of retrosternal chest tightness off and on associated with feeling  weak and tired. Also complains of exertional dyspnea with minimal exertion.  Denies any nausea, vomiting, diaphoresis.  Denies palpitation,  lightheadedness or syncope.  Denies any relation of chest pain to food,  breathing or movement.  EKG done in my office showed normal sinus rhythm  with old inferior wall MI and T-wave inversion in the anterolateral leads  which were new as compared to prior EKG.  Due to typical anginal chest pain  and new EKG changes, I discussed with the patient regarding left cath, its  risks and benefits, i.e., death, MI, stroke, local vascular complications,  etc., and she consented for the procedure.   PROCEDURE:  After obtaining informed consent, the patient was brought to the  cath lab and was placed on the fluoroscopy table.  The right groin was  prepped and draped  in usual fashion.  2% Xylocaine was used for local  anesthesia in the right groin.  With the help of a thin wall needle, a 4-  [place] arterial sheath was placed.  The sheath was aspirated and flushed.  Next, a 4-French left Judkins catheter was advanced over the wire under  fluoroscopic guidance up to the ascending aorta.  The wire was pulled out,  the catheter was aspirated and connected to the manifold.  The catheter was  further advanced and engaged into the left coronary ostium.  Multiple views  of the left system were taken.  Next, the catheter was disengaged and was  pulled out over the wire and was replaced with 4-French right Judkins  catheter which was advanced over the wire under fluoroscopic guidance into  the ascending aorta.  The wire was pulled out, the catheter was aspirated  and connected to the manifold.  The catheter was further advanced and  engaged into right coronary ostium.  Multiple views of the right system were  done.  Next, the catheter was disengaged and was pulled out over the wire  and was replaced with a 4-French pigtail catheter which was advanced over  the wire under fluoroscopic guidance up to the ascending aorta.  The wire  was pulled out, the catheter was aspirated and connected to the manifold.  The catheter was further advanced across aortic valve into the LV, LV  pressures were recorded.  Next, LV gram was done in 30 degrees RAO position.  Post angiographic pressures were recorded from LV and then pullback  pressures were recorded from the aorta.  There was no gradient across aortic  valve.  Next, the pigtail catheter was pulled out over the wire, the sheaths  were aspirated and flushed.   FINDINGS:  LV showed inferior wall severe hypokinesia, EF of approximately  50%.  The left main has 50% distal stenosis with very small ulcerated plaque  which appears to be stable.  The LAD has 20-30% sequential proximal and mid  stenosis and 90-95% distal focal  stenosis.  Diagonal one is very, very small  which was diffusely diseased.  Diagonal two is small and diffusely diseased.  The ramus was small which was patent.  Left circumflex has 30-40% proximal  and distal stenosis at prior PTCA site. OM1 was very,very small.  OM2 had 75-  80% distal stenosis.  The vessel distally is less than 2 mm.  RCA has 20-30%  proximal and mid stenosis and 50-60% mid and distal sequential stenosis and  90-95% stenosis at the bifurcation with the PDA.  The vessel distally is  less than 1.5 mm and not suitable for PCI.  PDA has 50-60% sequential  stenosis.  The vessel is also very, very small and diffusely diseased.   The patient tolerated procedure well.  There are no complications.  The  patient was transferred to recovery room in stable condition.  The patient  will be scheduled for PCI to distal LAD and possibly PTCA to distal OM2,  although this vessel is very small distally and also left circumflex makes  acute angled turn with left main.           ______________________________  Eduardo Osier. Sharyn Lull, M.D.     MNH/MEDQ  D:  11/24/2005  T:  11/24/2005  Job:  [id number]

## 2010-12-10 NOTE — Op Note (Signed)
[place]. [facility]  Patient:    [name], [name]                          MRN: [id number] Proc. Date: 10/14/99 Adm. Date:  [id number] Disc. Date: [id number] Attending:  Karenann Cai                           Operative Report  PREOPERATIVE DIAGNOSIS:  Immature cataract - left eye.  POSTOPERATIVE DIAGNOSIS:  Immature cataract - left eye.  PROCEDURE:  Kelman phacoemulsification cataract - left eye with intraocular lens implantation.  SURGEON:  Marya Landry. Carlyle Lipa., M.D.  ANESTHESIA:  Local using Xylocaine 2% and Marcaine 0.75%.  JUSTIFICATION FOR PROCEDURE:  This is a 77 year old lady, who complains of blurring of vision with difficulty seeing to read.  She has previously undergone a cataract extraction of the right eye, but now has a cataract of the left eye.  She has a  visual acuity best corrected at 20/25 on the right and 20/60 on the left. There was an intraocular lens implant on the right, but a 2+ nuclear sclerotic cataract with posterior subcapsular changes on the left.  Cataract extraction with intraocular lens implantation was recommended.  She was admitted at this time for that purpose.  PROCEDURE:  Under influence of IV sedation, a Van Lint akinesia and retrobulbar  anesthesia was given.  The patient was prepped and draped in the usual manner. The lid speculum was inserted under the upper and lower of the left eye.  A 4-0 silk traction suture was passed through the belly of the serratus muscle for traction. A fornix-based conjunctival flap was turned and hemostasis achieved by using the cautery.  An incision made in the sclera approximately 1 mm posterior to the limbus.  This incision was dissected down in clear cornea using the crescent blade. A stab wound incisions were made at the 1:30 oclock position.   Occucoat was injected into the eye through the side port incision.  The anterior chamber was  then entered  through the corneoscleral tunnel incision at the 11:30 oclock position.  An anterior capsulotomy was done using a bent 25-gauge needle.  The nucleus was hydrodissected using Xylocaine.  The KPE handpiece was passed into he eye and the nucleus was emulsified without difficulty.  The residual cortical material was aspirated.  The posterior capsule was polished using an olive tip polisher.  The wound was widened slightly to accommodate a 5 x 6 oval intraocular lens.  This lens was seated into the eye behind the iris without difficulty. The anterior chamber was reformed and the pupil constricted using Miochol.  The corneoscleral wound was then closed by using a single horizontal suture of 10-0  nylon.  Before closing the suture, the residual Occucoat was aspirated from the  eye.  After it was ascertained that the wound was airtight and watertight, the conjunctiva was closed using thermocautery.  One cc of Celestone, 0.5 cc of gentamicin were injected subconjunctivally.  Maxitrol ophthalmic ointment and Pilopine ointment were applied along with the patch and Fox shield.  The patient tolerated the procedure well and was discharged in satisfactory condition with instructions to rest in bed today, take Vicodin q.4h. as needed for pain and to see me in the office tomorrow for further evaluation.  DISCHARGE DIAGNOSIS:  Immature cataract - left eye.DD:  11/02/99 TD:  11/02/99 Job: [id number]

## 2010-12-10 NOTE — Discharge Summary (Signed)
[place]. [facility]  Patient:    [name]                           MRN: [id number] Proc. Date: 07/14/99 Adm. Date:  [id number] Attending:  Robynn Pane                           Discharge Summary  HISTORY OF PRESENT ILLNESS:  This is a 77 year old diabetic lady who presented o the office with complaints of blurring of vision with difficulty seeing to read. She was evaluated and found to have bilateral cataracts with the visual acuity est corrected at 20/60 on the right and 20/50 on the left.  She has a history of diabetes, hypertension, and atherosclerotic heart disease.  Cataract extraction of the right eye was recommended and she was admitted for this purpose.  HOSPITAL COURSE:  She was taken to the operating room where [facility] phacoemulsification of the cataract of the right eye was done along with an intraocular lens implantation.  The operative course was uncomplicated. Because she lives alone, she was admitted for overnight observation.  Her hospital course was marked by increase in her blood sugar which responded to her oral diabetic medications.  This morning, the patient was comfortable.  Her sugars were within normal limits.  The corneal areas are slightly hazy.  The anterior chamber was deep.  The wound is secure and the intraocular was in place.  She therefore is going to be discharged this morning with instructions to refrain from any strenuous such as stooping, bending, or lifting.  She is to see me in he office this morning for further evaluation.  DISCHARGE DIAGNOSES: 1. Immature cataract, right eye. 2. Diabetes mellitus. 3. Hypertension. 4. Atherosclerotic heart disease. DD:  07/16/99 TD:  07/18/99 Job: [id number]

## 2010-12-10 NOTE — Op Note (Signed)
[place]. [facility]  Patient:    [name]                           MRN: [id number] Proc. Date: 07/15/99 Adm. Date:  [id number] Attending:  Rinaldo Cloud N                           Operative Report  PREOPERATIVE DIAGNOSIS:  Immature cataract, right eye.  POSTOPERATIVE DIAGNOSIS:  Immature cataract, right eye.  OPERATION:  Kelman phacoemulsification of cataract, right eye.  ANESTHESIA:  Local using Xylocaine 2% with Marcaine 0.25% and [place].  SURGEON:  Marya Landry. Carlyle Lipa., M.D.  JUSTIFICATION FOR PROCEDURE:  This is a 77 year old diabetic lady who presented to the office with complaints of blurring of vision with difficulty seeing to read. She was evaluated and found to have bilateral cataracts with the visual acuity est corrected at 20/60 on the right and 20/50 on the left.  Cataract extraction of he right eye was recommended.  She is admitted at this time for that purpose.  DESCRIPTION OF PROCEDURE:  Under the influence of IV sedation with van Lint akinesis, retrobulbar anesthesia was given.  The patient was prepped and draped in the usual manner.  The lid speculum was inserted under the upper and lower lid f the right eye, and a 4-0 silk traction suture was passed through the belly of the superior rectus muscle for traction.  A fornix-based conjunctival flap was turned and hemostasis was achieved by using the cautery.  An incision was made in the sclera approximately 1 mm posterior to the limbus.  This incision was dissected  down into clear cornea using the crescent blade.  A side port incision was made at the 1:30 oclock position.  Occucoat was injected into the eye through the side port incision.  The anterior chamber was then entered through the corneoscleral  tunnel incision at the 11 oclock position using a 3.2 mm keratome.  An anterior  capsulotomy was done using a bent 25-gauge needle.  The nucleus  was hydrodissected using Xylocaine.  The KPE hand piece was placed into the eye and the nucleus was emulsified without difficulty.  The residual cortical material was aspirated. he posterior capsule was polished using the Alu-Tip polisher.  The wound was then widened to accommodate a 5 x 6 ______ intraocular lens.  This lens was seated into the eye behind the iris without difficulty.  The anterior chamber was reformed nd the pupil was constricted using Miochol.  The corneoscleral wound was then closed by using a single horizontal suture of  10-0 nylon.  Before closing the sutures, the residual Occucoat was aspirated from the eye.  After it was ascertained that the wound was airtight and watertight, he conjunctiva was closed using thermocautery.  One cubic centimeter of Celestone nd a 0.5 cc of gentamicin were injected subconjunctivally.  Maxitrol ophthalmic ointment and Pilopine ointment were applied along with a patch and a Fox shield.  The patient tolerated the procedure well and was returned to the postanesthesia  recovery room in satisfactory condition.  She is to be admitted for overnight observation as she has a variety of medical problems including diabetes, hypertension, and arteriosclerotic heart disease, and she lives alone.  She will be discharged tomorrow if her condition is satisfactory.  DISCHARGE DIAGNOSES: 1. Immature cataract, right eye. 2. Diabetes mellitus. 3. Hypertension. 4. Arteriosclerotic heart disease.  DD:  07/15/99 TD:  07/17/99 Job: [id number]

## 2010-12-10 NOTE — Cardiovascular Report (Signed)
[place]. [facility]  Patient:    [name], [name] Visit Number: [id number] MRN: [id number]          Service Type: CAT Location: [id number] Attending Physician:  Robynn Pane Dictated by:   Eduardo Osier Sharyn Lull, M.D. Proc. Date: 08/02/01 Admit Date:  08/02/2001   CC:         Cardiac Catheterization Lab   Cardiac Catheterization  PROCEDURE:  Left cardiac catheterization with selective left and right coronary angiography, left ventriculography via right groin using Judkins technique.  INDICATIONS FOR PROCEDURE:  [name] is a 77 year old black female with a past medical history significant for coronary artery disease status post inferior wall MI in October 2000 treated with emergency PTCA to distal right coronary artery, hypertension, insulin-requiring diabetes mellitus, history of tobacco abuse, history of CA of breast, complains of retrosternal chest discomfort off and on associated with shortness of breath and feeling tired and weak.  Denies palpitations, lightheadedness, or syncope.  Denies PND, orthopnea, or leg swelling.  Denies rest or nocturnal angina.  The patient underwent Persantine Cardiolite on December 30 which showed inferior and inferolateral wall reversible ischemia with an EF of 79%.  Due to chest pain and positive Persantine Cardiolite, discussed with patient various options of treatment, i.e., medical versus invasive, its risks and benefits and consented for PCI.  DESCRIPTION OF PROCEDURE:  After obtaining the informed consent, the patient was brought to the catheterization lab and was placed on the fluoroscopy table.  The right groin was prepped and draped in the usual fashion. Xylocaine, 2%, was used for local anesthesia in the right groin.  With the help of a thin-walled needle, a #6 French arterial sheath was placed.  The sheath was aspirated and flushed.  Next, a #6 French left Judkins catheter was advanced over the wire  under fluoroscopic guidance up to the ascending aorta. The wire was pulled out.  The catheter was aspirated and connected to the manifold.  The catheter was further advanced and engaged into the left coronary ostium.  Multiple views of the left system were taken.  Next, the catheter was disengaged and was pulled out over the wire and was replaced with a #6 [place] Judkins catheter which was advanced over the wire under fluoroscopic guidance up to the ascending aorta.  The wire was pulled out. The catheter was aspirated and connected to the manifold.  The catheter was further advanced and engaged into the right coronary ostium.  Multiple views of the right system were taken.  Next, the catheter was disengaged and was pulled out over the wire and was replaced with a #6 French pigtail catheter which was advanced over the wire under fluoroscopic guidance up to the ascending aorta.  The wire was pulled out.  The catheter was aspirated and connected to the manifold.  The catheter was further advanced across the aortic valve into the LV.  LV pressures were recorded.  Next, a left ventriculography was done in 30-degree RAO position.  Post angiographic pressures were recorded from LV and then pullback pressures were recorded from the aorta.  There was no gradient across the aortic valve.  Next, the pigtail catheter was pulled out over the wire.  Sheaths were aspirated and flushed.  FINDINGS:  LV showed mid inferior wall severe hypokinesia, EF of 55-60%.  1. The left main was patient. 2. The LAD has 10-15% mid stenosis.  Diagonal #1 was very, very small.    Diagonal #2 has 20-30%  proximal stenosis. 3. The left circumflex has 85-90% distal stenosis.  OM-1 has 80-85% mid    sequential stenosis. 4. The RCA has 30-40% distal stenosis and 30-40% stenosis in the PDA at the    prior angioplasty site.  INTERVENTIONAL PROCEDURE:  Successful PTCA to left circumflex was done using 2.0- x 15-mm long  [place] balloon.  The lesion was dilated from 85-90% to less than 10% residual with excellent TIMI grade 3 distal flow without evidence of dissection or distal embolization.  Then, successful PTCA to OM-1 was done using the same 2.0- x 15-mm long [place] balloon.  The lesion was dilated from 80-85% to less than 20% residual with excellent TIMI grade 3 distal flow without evidence of dissection or distal embolization.  The patient received weight-based heparin and ReoPro during the procedure.  The patient tolerated the procedure well.  There were no complications.  The patient was transferred to the recovery room in stable condition.  The patient did lose approximately 100-150 cc of blood during the procedure and received approximately [phone] cc of fluid during the procedure.  Preprocedure, the patients hemoglobin was 9.6.  We will monitor her hemoglobin closely and may need blood transfusion if her hemoglobin drops below 8.  Dictated by:   Eduardo Osier Sharyn Lull, M.D. Attending Physician:  Robynn Pane DD:  08/02/01 TD:  08/03/01 Job: [id number]

## 2010-12-10 NOTE — Op Note (Signed)
[place]. [facility]  Patient:    [name], [name]                          MRN: [id number] Adm. Date:  [id number] Disc. Date: [id number] Attending:  Karenann Cai                           Operative Report  PREOPERATIVE DIAGNOSIS:  Immature cataract, left eye.  POSTOPERATIVE DIAGNOSIS:  Immature cataract, left eye.  ANESTHESIA:  Local using xylocaine 2%, Marcaine 0.75% and Wydase.  JUSTIFICATION FOR PROCEDURE:  This is a 77 year old diabetic lady who complains of blurring of vision with difficulty seeing to read.  She had a cataract extracted from the right eye several years ago and now has had progressive decrease in visual acuity in the left eye.  She was evaluated and found to have a visual acuity best corrected to 25/5 on the right, 20/26 on the left.  An intraocular lens is present on the right.  There is a dense posterior subcapsular cataract present on the left. Cataract extraction with intraocular lens implantation was recommended.  She is  admitted at this time for that purpose.  DESCRIPTION OF PROCEDURE:  Under the influence of IV sedation, Van Lint akinesia and retrobular anesthesia was given.  The patient was prepped and draped in the  usual manner.  A lid speculum was inserted under the upper and lower lid of the  right eye and a 4-0 silk traction suture was passed through the belly of the rectus muscle for traction.  A fornix based conjunctival flap was turned and hemostasis achieved using cautery.  An incision was made in the sclera approximately 1 mm posterior to the limbus.  This incision was dissected down into the clear cornea using a crescent blade.  A ______ incision was made at the 1:30 oclock position. OcuCoat was injected into the eye through the ______ incision.  The anterior chamber was then entered through a corneal scleral tunnel incision at the 11:20  oclock position.  An anterior capsulotomy was then  performed using a bent 25 gauge needle.  The nucleus was hydrodissected using Xylocaine.  The hand piece was fastened to the eye and the nucleus was emulsified without difficulty.  The residual cortical material was aspirated.  The posterior capsule was then polished using IO-tipped polisher.  The wound was then widened to accommodate a 5 x 6 Owen intraocular lens.  This lens was seated into the eye behind the ______ without difficulty.  The anterior chamber was reformed and the pupil was constricted using alcohol.  The residual OcuCoat was aspirated from the eye.  The cornea scleral wound was then closed using a single horizontal suture of 10-0 nylon.  After ascertaining that the wound was airtight and watertight, the conjunctiva was closed using thermocautery.  Then, 1 cc of Celestone and 0.5 cc of gentamycin were injected subconjunctivally.  Maxitrol ophthalmic ointment and Eserine ointment ere were applied along with a patch and Fox shield.  The patient tolerated the procedure well and was discharged to the post anesthesia unit in satisfactory condition.  She is to be discharged when stable with instructions to rest in bed today, take Vicodin every four hours as needed for pain and to see me in the office tomorrow for further evaluation.  DISCHARGE DIAGNOSIS:  Immature cataract, left eye. DD:  10/15/99 TD:  10/15/99 Job: [phone]  [id number]

## 2010-12-10 NOTE — Discharge Summary (Signed)
[place]. [facility]  Patient:    [name], [name] Visit Number: [id number] MRN: [id number]          Service Type: CAT Location: 6500 [id number] Attending Physician:  Robynn Pane Dictated by:   Eduardo Osier Sharyn Lull, M.D. Admit Date:  08/02/2001 Discharge Date: 08/03/2001   CC:         Merlene Laughter. Renae Gloss, M.D.   Discharge Summary  ADMISSION DIAGNOSES: 1. Accelerated angina. 2. Positive Persantine Cardiolite. 3. Coronary artery disease. 4. History of inferior wall myocardial infarction. 5. Hypertension. 6. Insulin-dependent diabetes mellitus. 7. History of cancer of breast. 8. History of tobacco abuse. 9. Chronic anemia.  DISCHARGE DIAGNOSES: 1. Accelerated angina. 2. Positive Persantine Cardiolite, status post percutaneous transluminal    coronary angioplasty to left circumflex and obtuse marginal 1. 3. Coronary artery disease. 4. History of inferior wall myocardial infarction, status post percutaneous    transluminal coronary angioplasty to right coronary artery in October of    2000. 5. Hypertension. 6. Insulin-dependent diabetes mellitus. 7. History of tobacco abuse. 8. History of cancer of breast. 9. Chronic anemia, etiology unclear.  DISCHARGE MEDICATIONS: 1. Enteric-coated aspirin 325 mg one tablet daily. 2. Altace 2.5 mg one capsule daily. 3. Toprol XL 50 mg 1/2 tablet daily. 4. Nitro-Dur 0.2 mg per hour, apply to chest wall in a.m. off at night. 5. Lantus Insulin 25 units subcu at night for 48 hours and then cut back to    15 units subcu as before. Restart Glucovance after 48 hours as before.  ACTIVITY:  Avoid heavy lifting, pushing, or pulling for 48 hours.  DIET:  Low salt, low cholesterol, 1800 calorie ADA diet.  The patient has been advised to monitor blood sugar closely.  Postangioplasty instructions have been given.  CBC and BMP in one week.  FOLLOW-UP:  Follow up with me in one week and Dr. Renae Gloss in two weeks.  CONDITION  ON DISCHARGE:  Stable.  BRIEF HISTORY:  [name] is a 77 year old black female with past medical history significant for coronary artery disease, status post inferior wall MI in October of 2000 requiring emergency PTCA to distal RCA, hypertension, insulin-dependent diabetes mellitus, history of tobacco abuse, history of CA of breast, who complains of retrosternal chest discomfort off and on associated with shortness of breath and feeling weak and tired.  Denies any palpitation, lightheadedness, or syncope.  Denies PND, orthopnea, or leg swelling.  Denies rest or nocturnal angina.  The patient underwent Persantine Cardiolite on July 23, 2001, which showed inferior and anterolateral wall reversible ischemia with EF of 79%.  Due to chest pain and positive Persantine Cardiolite, discussed with the patient various options of treatment and consented for PCI.  PAST MEDICAL HISTORY:  As above.  PAST SURGICAL HISTORY:  She had right breast lumpectomy for ductal cancer of the breast.  MEDICATIONS: 1. Glucovance 5/500 p.o. b.i.d. which is on hold for 48 hours. 2. Lantus Insulin 15 units h.s. subcu. 3. Plavix 75 mg p.o. q.d. 4. Altace 2.5 mg p.o. q.d. 5. Toprol XL 50 mg 1/2 p.o. q.d. 6. Nitro-Dur 0.2 mg per hour q.d. 7. Tamoxifen one tablet daily.  SOCIAL HISTORY:  She is widowed, worked in [employer].  Smoked 1-1/2 packs per day for 40+ years, quit about eight years ago.  No history of alcohol abuse. She lives in [place].  FAMILY HISTORY:  Father died of MI at the age of 40, although, he had three MIs prior to his death.  Mother  died of CVA at the age of 37.  PHYSICAL EXAMINATION:  GENERAL:  She is alert and oriented x 3 in no acute distress.  VITAL SIGNS: Blood pressure 140/80, pulse 76 regular.  HEENT: Conjunctivae pink.  NECK: Supple, no JVD, and no bruit.  LUNGS: Clear to auscultation without rhonchi or rales.  HEART: S1 and S2 was normal.  There was soft systolic  murmur.  There as no S3 gallop.  ABDOMEN: Soft.  Bowel sounds present.  Nontender.  EXTREMITIES: There is no clubbing, cyanosis, or edema.  HOSPITAL COURSE:  The patient was directly admitted and underwent left cardiac catheterization with selective left and right coronary angiography, left ventriculography, and PTCA to left circumflex and OM1 as per procedure report. The patient tolerated the procedure well and there were no complications. Postprocedure, the patients hemoglobin dropped to 7.9.  Due to blood loss during the procedure and hydration, the patient received two units of packed red blood cells.  There was no evidence of hematoma or bruit and there was no evidence of retroperitoneal bleeds.  The patient has been ambulating in the hallway without any problems.  After two units of packed red blood cells, her hemoglobin has come back from 7.9 to 11.4 with hematocrit of 32.9.  Her platelet count is 129.  Her CPKs postprocedure was 85.  Her BUN 9, creatinine 1.0.  The patient will be discharged home on above medications and will be followed up in my office in one week and Dr. Remus Loffler office in two weeks. Phase II cardiac rehabilitation has been arranged. Dictated by:   Eduardo Osier Sharyn Lull, M.D. Attending Physician:  Robynn Pane DD:  08/03/01 TD:  08/04/01 Job: [id number]

## 2010-12-10 NOTE — Procedures (Signed)
[place]. [facility]  Patient:    [name], [name]                          MRN: [id number] Proc. Date: 09/13/00 Adm. Date:  09/13/00 Attending:  Anselmo Rod, M.D. CC:         Merlene Laughter. Renae Gloss, M.D.   Procedure Report  DATE OF BIRTH:  April 09, 1929  REFERRING PHYSICIAN:  Merlene Laughter. Renae Gloss, M.D.  PROCEDURE PERFORMED:  Colonoscopy.  ENDOSCOPIST:  Anselmo Rod, M.D.  INSTRUMENT USED:  Olympus video colonoscope.  INDICATIONS FOR PROCEDURE:  Blood in stool in a 77 year old African-American female with a personal history of breast cancer, rule out colonic polyps, masses, hemorrhoids, etc.  PREPROCEDURE PREPARATION:  Informed consent was procured from the patient. The patient was fasted for eight hours prior to the procedure and prepped with a bottle of magnesium citrate and a gallon of NuLytely the night prior to the procedure.  PREPROCEDURE PHYSICAL:  The patient had stable vital signs.  Neck supple. Chest clear to auscultation.  S1, S2 regular.  Abdomen soft with normal abdominal bowel sounds.  DESCRIPTION OF PROCEDURE:  The patient was placed in the left lateral decubitus position and sedated with 50 mg of Demerol and 5 mg of Versed intravenously.  Once the patient was adequately sedated and maintained on low-flow oxygen and continuous cardiac monitoring, the Olympus video colonoscope was advanced from the rectum to the cecum with slight difficulty because of a large amount of residual stool in the colon; however, no large masses, polyps, erosions or ulcerations were seen.  There were small internal and external hemorrhoids seen on retroflexion and anal inspection respectively.  IMPRESSION: 1. Essentially unrevealing colonoscopy up to the cecum except for small    nonbleeding internal and external hemorrhoids. 2. Some residual stool in the colon.  RECOMMENDATIONS:  The patient has been advised to increase the fluid and fiber in the  diet and to follow up in the office on a p.r.n. basis.DD:  09/13/00 TD:  09/13/00 Job: [id number]

## 2010-12-10 NOTE — Op Note (Signed)
[name], [name]                 ACCOUNT NO.:  [id number]   MEDICAL RECORD NO.:  [id number]          PATIENT TYPE:  AMB   LOCATION:  SDC                           FACILITY:  [facility]   PHYSICIAN:  [place] B. Earlene Plater, M.D.  DATE OF BIRTH:  May 16, 1929   DATE OF PROCEDURE:  08/23/2004  DATE OF DISCHARGE:                                 OPERATIVE REPORT   PREOPERATIVE DIAGNOSES:  1.  Postmenopausal bleeding.  2.  Focal endometrial mass on ultrasound.  3.  Cervical stenosis.   POSTOPERATIVE DIAGNOSES:  1.  Uterine septum.  2.  Atrophic endometrium.   PROCEDURE:  Hysteroscopy D&C with ultrasound guidance.   SURGEON:  Chester Holstein. Earlene Plater, M.D.   ANESTHESIA:  MAC and paracervical block.   SPECIMENS:  Endometrial curettings.   ESTIMATED BLOOD LOSS:  20 mL.   FLUID DEFICIT:  60 mL Sorbitol.   COMPLICATIONS:  None.   INDICATION:  Patient with a history of post-menopausal bleeding.  Ultrasound  showed a fluid filled cavity and focal 1cm mass at the fundus.   DESCRIPTION OF PROCEDURE:  Patient taken to the operating room after being  apprised of the risks of surgery including infection, bleeding, uterine  perforation, damage to tissue and surrounding organs, patient was cleared  for surgery by her cardiologist for this operation.   The patient was taken to the operating room and MAC anesthesia obtained.  She was prepped and draped in standard fashion and bladder emptied with an  in and out catheterization.  Examination under anesthesia showed an  anteverted normal size uterus, no adnexal masses palpable.  Speculum  inserted and cervical stenosis again noted.  Pericervical block placed at 20  mL, 1% Nesacaine.  Jacob's tenaculum attached to the anterior lip of the  cervix and under ultrasound guidance, the uterine cavity was entered with a  tear duct probe.  The cervix was then dilated under ultrasound guidance to  #21.  The diagnostic hysteroscope was then inserted.  After being flushed  with Sorbitol, the uterine cavity was distended well and clearly visualized  throughout.  Both tubal ostia appeared normal.  There was no focal mass  noted.  However, there was a uterine septum seen in the midline which was  likely the focal mass noted on previous ultrasound.  The endometrium  appeared inactive and atrophic.  It was gently curetted with the serrated  curetted and submitted to pathology.   The speculum was removed and cervix hemostatic.   The patient tolerated the procedure well.  There were no complications.  She  was taken to the recovery room awake, alert and in stable condition.      WBD/MEDQ  D:  08/23/2004  T:  08/23/2004  Job:  [id number]   cc:   Eduardo Osier. Sharyn Lull, M.D.  [address]  [place]  [place] [address]  Fax: [phone]

## 2010-12-10 NOTE — Discharge Summary (Signed)
[name], [name]                 ACCOUNT NO.:  [id number]   MEDICAL RECORD NO.:  [id number]          PATIENT TYPE:  INP   LOCATION:  3701                         FACILITY:  [facility]   PHYSICIAN:  Mohan N. Sharyn Lull, M.D. DATE OF BIRTH:  February 20, 1929   DATE OF ADMISSION:  08/09/2004  DATE OF DISCHARGE:  08/12/2004                                 DISCHARGE SUMMARY   ADMISSION DIAGNOSES:  1.  New onset angina, rule out myocardial infarction.  2.  Coronary artery disease, status post inferior wall myocardial infarction      in the past.  3.  Hypertension.  4.  Non-insulin-dependent diabetes mellitus.  5.  History of cancer of breast.  6.  Remote history of tobacco abuse.  7.  Chronic anemia.  8.  History of vaginal bleeding.   DISCHARGE DIAGNOSES:  1.  Stable angina.  2.  Coronary artery disease, status post inferior wall myocardial infarction      in the past.  3.  Hypertension.  4.  Hypercholesterolemia.  5.  Non-insulin-dependent diabetes mellitus.  6.  Chronic anemia, status post painless vaginal bleeding.  7.  Endometrial polyp, rule out carcinoma.   DISCHARGE MEDICATIONS:  1.  Baby aspirin 81 mg two tablets daily.  2.  Toprol XL 50 mg, half tablet daily.  3.  Altace 10 mg, one capsule daily.  4.  Metformin 1000 mg, one tablet b.i.d. with food.  5.  Amaryl 4 mg, one tablet daily.  6.  Actos 45 mg, one tablet daily.  7.  Lipitor 20 mg, one tablet daily.  8.  Feosol 325 mg, one tablet b.i.d.   ACTIVITY:  As tolerated.   DIET:  Low salt, low cholesterol, 1800 calorie ADA diet.  The patient has  been advised to monitor blood sugar twice daily as before.   Followup with me and OB/GYN, Dr. Rosalio Macadamia, phone number [phone].   BRIEF HISTORY AND HOSPITAL COURSE:  [name] is a 77 year old black female  with past medical history significant for coronary artery disease, status  post inferior wall MI in October 2000, status post emergency PCI to RCA,  also status post PTCA to  left circumflex and obtuse marginal in January  2003, hypertension and non-insulin-requiring diabetes mellitus, history of  tobacco abuse, and a history of CA of breast who complains of retrosternal  chest pain off and on since Saturday associated with nausea and palpitation  lasting a few minutes, relieved with rest.  She states the chest pain feels  similar in nature when she had MI, denies shortness of breath and denies  lightheadedness and syncope.  Denies PND, orthopnea or leg swelling.  The  patient gives a history of exertional dyspnea but denies exertional chest  pain although her activity is limited.  Denies any relation of chest pain to  food, breathing or movement.  Denies cough, fever or chills.  Also complains  of occasional joint pain.   PAST MEDICAL HISTORY:  As above.   PAST SURGICAL HISTORY:  She had right breast lumpectomy for ductal CA and  cataract surgery in the  past.   MEDICATIONS AT HOME:  1.  Toprol XL 50 mg half tablet daily.  2.  Baby aspirin 81 mg p.o. daily.  3.  Plavix 75 mg p.o. daily.  4.  Altace 10 mg p.o. daily.  5.  Metformin 1000 mg p.o. b.i.d.  6.  Amaryl 4 mg p.o. daily.  7.  Actos 45 mg p.o. daily.   SOCIAL HISTORY:  She is retired, widowed, work at home in past and smoked 1-  1/2 packs per day for 40 years and quit 11 years ago, no history of alcohol  abuse.   FAMILY HISTORY:  Positive for coronary artery disease.   PHYSICAL EXAMINATION:  GENERAL:  She is alert and oriented x3, in no acute  distress.  VITAL SIGNS:  Blood pressure 130/80, pulse 63.  HEENT:  Conjunctivae pink.  NECK:  Supple, no JVD, no bruits.  LUNGS:  Clear to auscultation, without rhonchi or rales.  CARDIOVASCULAR:  S1/S2 normal.  There was a soft systolic murmur, there was  no S3 gallop or S4 gallop.  ABDOMEN:  Soft, bowel sounds present, nontender.  EXTREMITIES:  No clubbing, cyanosis or edema.   LABORATORY DATA:  EKG showed normal sinus rhythm, old inferior wall  MI, poor  R wave progression in V2-V3.   Cholesterol was 179, LDL 123, HDL 41.  Three sets of CPK-MBs were negative,  three sets of troponin I were negative.  Hemoglobin A1c was 7.1.  Sodium  141, potassium 4.5, chloride 112, bicarb 23, glucose 100, BUN 20, creatinine  1.1.  Hemoglobin 9.9, hematocrit 39.8, white count 3.5, platelet count  159,000 which has been stable.   Pelvic ultrasound showed 5 x 3 mm soft tissue mass within the endometrium,  could represent polyp or carcinoma.  Chest x-ray showed no focal infiltrate  or pulmonary edema.  Persantine Myoview showed RCA distribution scar with no  evidence of reversible ischemia, EF of 73%.   HOSPITAL COURSE:  The patient was admitted to the telemetry unit and MI was  ruled out by serial enzymes and EKG.  The patient underwent Persantine  Myoview as above which showed inferior wall scar with no evidence of  reversible ischemia with EF of 73%.  The patient did not have any episodes  of further chest pain during the hospital stay.  The patient did report to  the nurse during the hospital stay that she has been having pain with  vaginal bleeding for the last few months but did not tell anyone.  OB/GYN  consult was obtained and the patient subsequently underwent pelvic  ultrasound which showed a very small endometrial polyp.  The patient is  scheduled to see Dr.  Rosalio Macadamia on September 06, 2004 at 11 a.m. as an outpatient.  The patient  probably will have a D&C as an outpatient.  The patient did not have any  further episodes of vaginal bleeding during the hospital stay and her  hemoglobin is stable and she will be discharged home on the above  medications and will be followed up in my office in  1 week.       MNH/MEDQ  D:  08/12/2004  T:  08/12/2004  Job:  [id number]   cc:   Sherry A. Rosalio Macadamia, M.D.  [address]  [place]  [place] [address]  Fax: [phone]

## 2010-12-10 NOTE — Cardiovascular Report (Signed)
NAMEMYRLE, WANEK                 ACCOUNT NO.:  [id number]   MEDICAL RECORD NO.:  [id number]          PATIENT TYPE:  OIB   LOCATION:  6525                         FACILITY:  [facility]   PHYSICIAN:  Mohan N. Sharyn Lull, M.D. DATE OF BIRTH:  August 16, 1928   DATE OF PROCEDURE:  12/01/2005  DATE OF DISCHARGE:                              CARDIAC CATHETERIZATION   PROCEDURES:  1.  Successful PTCA to mid and distal junction of LAD using 2.0 x 8 mm long      Voyager balloon.  2.  Successful deployment of 2.5 x 13-mm long Cypher drug-eluting stent in      mid and distal junction of LAD.  3.  Successful PTCA to mid and distal junction of OM2 using 2.0 x 8-mm long      Voyager balloon.   INDICATIONS FOR THE PROCEDURE:  Mrs. Biggers is a 77 year old old black  female with past medical history significant for coronary artery disease,  status post inferior wall MI, status post PCI to RCA and circumflex and  obtuse marginal in the past, hypertension, non-insulin-dependent diabetes  mellitus, hypercholesteremia, history of tobacco abuse, chronic anemia,  history of vaginal bleeding in the past.  Complains of retrosternal chest  tightness off and on, associated with feeling weak and tired.  Also,  complains of exertional dyspnea with minimal exertion.  Denies any nausea,  vomiting, diaphoresis.  Denies palpitation, lightheadedness or syncope.  Denies relation of chest pain to food, breathing or movement.  EKG done in  my office showed normal sinus rhythm with old inferior wall MI and in T-wave  inversion in anterolateral leads which were new as compared to prior EKG.  The patient subsequently underwent left cath as outpatient which showed  critical stenosis in the mid and distal junction of LAD and restenosis in  obtuse marginal 2.  The patient is brought to the hospital electively for  PCI to LAD and obtuse marginal 2.   PROCEDURE:  After obtaining the informed consent, patient was brought to the  cath  lab and was placed on fluoroscopy table.  The right groin was prepped  and draped in the usual fashion.  2% Xylocaine was used for local anesthesia  in the right groin.  With the help of thin-wall needle, a 7-French arterial  sheath was placed.  Sheath was aspirated and flushed.  Next, 7-French 3.5  Voda guiding catheter was advanced over the wire under fluoroscopic guidance  up to the ascending aorta.  Wire was pulled out, the catheter was aspirated  and connected to the manifold.  Catheter was further advanced and engaged  into left coronary ostium.  Multiple views of the left system were taken.   INTERVENTIONAL PROCEDURE:  Successful PTCA to the mid and distal junction of  LAD done using 2.0 x 8-mm long Voyager balloon for predilatation and then  2.5 x 13-mm long Cypher drug-eluting stent was deployed at 11 atmospheric  pressure which was fully expanded going up to 18 atmospheric pressure.  Lesion was dilated from 95% to 0% residual with excellent TIMI grade III  distal flow  without evidence of dissection or distal embolization.  Next,  successful PTCA to mid and distal junction of OM2 was done using 2.0 x 8-mm  long same Voyager balloon.  Multiple inflations were done going up to 8-11  atmospheric pressure.  Lesion was dilated from 85% to less than  20% residual with excellent TIMI grade III distal flow without evidence of  dissection or distal embolization.  The patient received weight-based  Angiomax and 300 mg of Plavix during the procedure.  Patient tolerated  procedure well.  There were no complications.  The patient was transferred  to recovery room in stable condition.           ______________________________  Eduardo Osier Sharyn Lull, M.D.     MNH/MEDQ  D:  12/01/2005  T:  12/02/2005  Job:  [id number]   cc:   Eduardo Osier. Sharyn Lull, M.D.  Fax: [phone]   Cath lab

## 2010-12-27 HISTORY — PX: CAPSULECTOMY: SHX5381

## 2010-12-28 ENCOUNTER — Ambulatory Visit (HOSPITAL_COMMUNITY)
Admission: RE | Admit: 2010-12-28 | Discharge: 2010-12-28 | Disposition: A | Discharge status: Home / Self Care | Payer: Medicare Other | Source: Ambulatory Visit | Attending: Ophthalmology | Admitting: Ophthalmology

## 2010-12-28 DIAGNOSIS — H2640 Unspecified secondary cataract: Secondary | ICD-10-CM | POA: Insufficient documentation

## 2011-01-19 ENCOUNTER — Other Ambulatory Visit: Payer: Self-pay | Admitting: Internal Medicine

## 2011-02-08 ENCOUNTER — Encounter: Payer: Self-pay | Admitting: Internal Medicine

## 2011-02-09 ENCOUNTER — Encounter: Payer: Self-pay | Admitting: Internal Medicine

## 2011-02-09 ENCOUNTER — Ambulatory Visit (INDEPENDENT_AMBULATORY_CARE_PROVIDER_SITE_OTHER): Payer: Medicare Other | Admitting: Internal Medicine

## 2011-02-09 ENCOUNTER — Telehealth: Payer: Self-pay | Admitting: *Deleted

## 2011-02-09 ENCOUNTER — Other Ambulatory Visit (INDEPENDENT_AMBULATORY_CARE_PROVIDER_SITE_OTHER): Payer: Medicare Other

## 2011-02-09 DIAGNOSIS — E119 Type 2 diabetes mellitus without complications: Secondary | ICD-10-CM

## 2011-02-09 DIAGNOSIS — R413 Other amnesia: Secondary | ICD-10-CM

## 2011-02-09 NOTE — Assessment & Plan Note (Signed)
Uncontrolled - complicated by noncompliance and confusion Oral meds "stopped" 10/2010 and resumed Lantus after UC eval 10/20/10 - increased dose rx'd 10/2010 Prior to making new change, need to verify WHAT pt is on - Pt now lives with her dtr (diane) who will be helping pt with her meds -  Check a1c now and pt/family to call if sugar over 200 - Lab Results  Component Value Date   HGBA1C 12.4* 06/02/2010

## 2011-02-09 NOTE — Patient Instructions (Signed)
It was good to see you today.  Medications reviewed, no changes at this time - see list below and call if questions Test(s) ordered today. Your results will be called to you after review (48-72hours after test completion). If any changes need to be made, you will be notified at that time. call if sugars over 200 or other questions/problems Please schedule followup in 6-12 weeks, call sooner if problems.

## 2011-02-09 NOTE — Assessment & Plan Note (Signed)
Progressive distractibility and forgetfulness -  Prior labs unremarkable -Normal TSH and b12 MRI brain 08/2009 - meningioma and SVD (reveiwed) Lab Results  Component Value Date   VITAMINB12 452 09/08/2009   rx'd aricept  10/2010 but unclear if started - dtr will review and start same at this time

## 2011-02-09 NOTE — Telephone Encounter (Signed)
Caller inquiring as to status of Physician Supply Order. Have you seen this yet?

## 2011-02-09 NOTE — Progress Notes (Signed)
  Subjective:    Patient ID: [name], female    DOB: 11-27-1928, 77 y.o.   MRN: [id number]  HPI  Here for follow up - reviewed chronic medical issues: (dtr diane also here)  uncontrolled DM - noncompliant and confused Long hx same - variable cbgs - high >500, never <150; now in 200s per dtr Ongoing confusion about medications, esp insulin - had stopped same fall 2011 due to noncompliance but resumed 10/2010 dtr reports also taking pills and using insulin only prn denies hypoglycemia symptoms or events; +PU/PD - no fever or CP, no abd pain or n/v/d checks cbgs sporatically at home dtr lays out meds into pill box "but i forget to take them"  CAD - hosp x 2 in Mar 2011 and April 2011 for NQWMI notes and labs from hosp reviewed - no further syncopre, no CP, no edema  anemia - continues to feel weak without change - taking iron as rx'd = no adv Se reported report colo 06/2009 reviewed - neg  HTN - reports "compliance" with ongoing medical treatment - but unable to explain what meds are taken for this dx. denies adverse side effects related to current therapy.   dyslipidemia- reports "compliance" with ongoing medical treatment - but again unable to list meds or describe denies adverse side effects related to current therapy.   Past Medical History  Diagnosis Date  . 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 Cough summer 2011    ACEI related  . ANEMIA   . DEPRESSION   . DIABETES MELLITUS, TYPE II   . DYSLIPIDEMIA   . HYPERTENSION   . PVD   . INSOMNIA, CHRONIC   . Dementia    Review of Systems  Constitutional: Negative for unexpected weight change.  Respiratory: Negative for shortness of breath.   Cardiovascular: Negative for chest pain.  Neurological: Negative for headaches.      Objective:   Physical Exam  BP 144/72  Pulse 55  Temp(Src) 98.3 F (36.8 C) (Oral)  Ht 5\' 3"  (1.6 m)  Wt 163 lb (73.936 kg)  BMI 28.87 kg/m2  SpO2 96% Physical Exam  Constitutional:  She is oriented to person, place, and time. She appears well-developed and well-nourished. No distress. dtr diane at side Cardiovascular: Normal rate, regular rhythm and normal heart sounds.  No murmur heard. no BLE edema Pulmonary/Chest: Effort normal and breath sounds normal. No respiratory distress. She has no wheezes.  Neurological: She is alert and oriented to person, place, but not time; distracted and forgetful - 0 out of 3 recall at 3 minutes. No cranial nerve deficit. Coordination normal.  Psychiatric: She has a normal mood and affect. Her behavior is normal. Judgment and thought content impaired.  .    Assessment & Plan:  See problem list. Medications and labs reviewed today.

## 2011-02-10 NOTE — Telephone Encounter (Signed)
Has not seen any forms on [name] as of this am..Marland Kitchen7/19/12@8 :19am/LMB

## 2011-02-11 ENCOUNTER — Telehealth: Payer: Self-pay | Admitting: *Deleted

## 2011-02-11 DIAGNOSIS — E119 Type 2 diabetes mellitus without complications: Secondary | ICD-10-CM

## 2011-02-11 MED ORDER — INSULIN GLARGINE 100 UNIT/ML ~~LOC~~ SOLN: 30.0000 [IU] | Freq: Every day | SUBCUTANEOUS | Status: DC

## 2011-02-11 NOTE — Telephone Encounter (Signed)
Called pt spoke with her daughter gave results concerning labs. Daughter states mom hasn't been taking lantus. Needing new rx sent to [facility] mail [facility].Marland KitchenMarland Kitchen7/20/12@11 :25am/LMB

## 2011-02-11 NOTE — Telephone Encounter (Signed)
lantus refill sent to both pharmacy - please contact [facility] with verbal order to dc glucotrol - thanks

## 2011-02-11 NOTE — Telephone Encounter (Signed)
Notified [facility] gave verbal order to d/c glucotrol. Faxed over script for lantus.Marland KitchenMarland Kitchen7/20/12@2 :05pm/LMB

## 2011-02-11 NOTE — Telephone Encounter (Signed)
Pt states that she needs her Insulin and pharmacy [facility] Fax [phone]] will not dispense until they are contacted with order & also pharmacy needs order to Cancel/discontinue Glucotrol.

## 2011-02-25 ENCOUNTER — Encounter: Payer: Self-pay | Admitting: Internal Medicine

## 2011-02-25 ENCOUNTER — Ambulatory Visit (INDEPENDENT_AMBULATORY_CARE_PROVIDER_SITE_OTHER): Payer: Medicare Other | Admitting: Internal Medicine

## 2011-02-25 ENCOUNTER — Other Ambulatory Visit: Payer: Medicare Other

## 2011-02-25 DIAGNOSIS — S335XXA Sprain of ligaments of lumbar spine, initial encounter: Secondary | ICD-10-CM

## 2011-02-25 DIAGNOSIS — M545 Low back pain: Secondary | ICD-10-CM

## 2011-02-25 MED ORDER — CYCLOBENZAPRINE HCL 5 MG PO TABS: 5.0000 mg | ORAL_TABLET | Freq: Two times a day (BID) | ORAL | Status: AC | PRN

## 2011-02-25 NOTE — Progress Notes (Signed)
  Subjective:    [name] is a 77 y.o. female who presents for evaluation of low back pain. The patient has had recurrent self limited episodes of low back pain in the past. Symptoms have been present for 6 months and are stable.  Onset was related to / precipitated by no known injury. The pain is located in the left lumbar area and does not radiate. The pain is described as aching and soreness and occurs upon awakening and "walking long way after sitting too long". She is currently in no pain. Symptoms are exacerbated by walking for more than 5 minutes. Symptoms are improved by rest. She has also tried stretching which provided no symptom relief. She has no other symptoms associated with the back pain. The patient has no "red flag" history indicative of complicated back pain.  The following portions of the patient's history were reviewed and updated as appropriate: allergies, current medications, past family history, past medical history, past social history, past surgical history and problem list.  Review of Systems Constitutional: negative for chills, fevers, night sweats and weight loss Cardiovascular: negative for chest pressure/discomfort, claudication and dyspnea Gastrointestinal: negative Musculoskeletal:negative for arthralgias, bone pain and muscle weakness    Objective:   Full range of motion without pain, no tenderness, no spasm, no curvature. Normal reflexes, gait, strength and negative straight-leg raise.   BP 140/72  Pulse 62  Temp(Src) 98.2 F (36.8 C) (Oral)  Ht 5\' 3"  (1.6 m)  Wt 163 lb 1.9 oz (73.991 kg)  BMI 28.90 kg/m2  SpO2 97% Gen: NAD, dtr at side CV: RRR, no BLE edema Lung: CTA, no inc WOB, no W/C Assessment:    nonspecific back pain, lumbar spasm with suspected lumbar DDD vs stenosis    Plan:    Natural history and expected course discussed. Questions answered. Proper lifting, bending technique discussed. Heat to affected area as needed for local pain  relief. OTC analgesics as needed. Muscle relaxants per medication orders.  Pt also requests "kidney check" - UA today ordered

## 2011-02-25 NOTE — Patient Instructions (Addendum)
  Back Pain & Injury Your back pain is most likely caused by a strain of the muscles or ligaments supporting the spine and arthritis. Back strains cause pain and trouble moving because of muscle spasms. They may take several weeks to heal. Usually they are better in days, but can recur due to underlying arthritis.  Treatment for back pain includes:  Rest - Get bed rest as needed over the next day or two. Use a firm mattress and lie on your side with your knees slightly bent. If you lie on your back, put a pillow under your knees.   Early movement - Back pain improves most rapidly if you remain active. It is much more stressful on the back to sit or stand in one place. Do not sit, drive or stand in one place for more than 30 minutes at a time. Take short walks on level surfaces as soon as pain allows.   Limit bending and lifting - Do not bend over or lift anything over 20 pounds until instructed otherwise. Lift by bending your knees. Use your leg muscles to help. Keep the load close to your body and avoid twisting. Do not reach or do overhead work.   Medicines - Medicine to reduce pain and inflammation are helpful. Muscle-relaxing drugs may be prescribed. Also use tylenol arthritis as discussed  Therapy - Put ice packs on your back every few hours for the first 2-3 days. After that ice or heat may be alternated to reduce pain and spasm. Back exercises and gentle massage may be of some benefit. You should be examined again if your back pain is not better in one week.  SEEK IMMEDIATE MEDICAL CARE IF:  You have pain that radiates from your back into your legs.   You develop new bowel or bladder control problems.   You have unusual weakness or numbness in your arms or legs.   You develop nausea or vomiting.   You develop abdominal pain.   You feel faint.  Document Released: 07/11/2005 Document Re-Released: 04/19/2008 [facility] Patient Information 2011 [place], [place].  Use Flexeril (muscle  relaxant) as needed for spasm and tightness pain in low back Also urine test ordered today. Your results will be called to you after review (48-72hours after test completion). If any changes need to be made, you will be notified at that time. We completed a temporary hang tag for handicap parking to use as needed If pain becomes worse, let us know - otherwise, please schedule followup in 3-4 months for diabetes check, call sooner if problems.

## 2011-03-16 ENCOUNTER — Ambulatory Visit (INDEPENDENT_AMBULATORY_CARE_PROVIDER_SITE_OTHER): Payer: Medicare Other | Admitting: Internal Medicine

## 2011-03-16 ENCOUNTER — Encounter: Payer: Self-pay | Admitting: Internal Medicine

## 2011-03-16 VITALS — BP 130/64 | HR 52 | Temp 98.5°F | Ht 63.0 in

## 2011-03-16 DIAGNOSIS — B029 Zoster without complications: Secondary | ICD-10-CM

## 2011-03-16 MED ORDER — VALACYCLOVIR HCL 1 G PO TABS: 1000.0000 mg | ORAL_TABLET | Freq: Three times a day (TID) | ORAL | Status: DC

## 2011-03-16 MED ORDER — LIDOCAINE 5 % EX PTCH: 1.0000 | MEDICATED_PATCH | CUTANEOUS | Status: AC

## 2011-03-16 NOTE — Progress Notes (Signed)
  Subjective:    Patient ID: [name], female    DOB: 28-Jul-1928, 77 y.o.   MRN: [id number]  HPI complains of rash over left flank Onset 2 days ago Felt sore and itchy in this area x 3-4 days prior to onset on rash New blisters and burning pain over affected skin in last 36h, spreading from back around to side and front/groin on R side No fever or trauma No outdoor exposures No other skin affected  Past Medical History  Diagnosis Date  . 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 ANEMIA   . DEPRESSION   . DIABETES MELLITUS, TYPE II   . DYSLIPIDEMIA   . HYPERTENSION   . PVD   . INSOMNIA, CHRONIC   . Dementia     Review of Systems  Respiratory: Negative for cough and shortness of breath.   Cardiovascular: Negative for chest pain.  Musculoskeletal: Negative for joint swelling and gait problem.  Hematological: Does not bruise/bleed easily.       Objective:   Physical Exam BP 130/64  Pulse 52  Temp(Src) 98.5 F (36.9 C) (Oral)  Ht 5\' 3"  (1.6 m)  SpO2 96% Constitutional: She appears well-developed and well-nourished. No distress. Dtr at side Neck: Normal range of motion. Neck supple. No JVD present. No thyromegaly present.  Cardiovascular: Normal rate, regular rhythm and normal heart sounds.  No murmur heard. No BLE edema. Pulmonary/Chest: Effort normal and breath sounds normal. No respiratory distress. She has no wheezes. Skin: classic shingles lower right flank: rosy base with coalescing vesicles in dermatomal pattern extending from near midline on back towards r side and r groin. Remaining skin is warm and dry. No other rash noted. No erythema.  Psychiatric: She has a normal mood and affect. Her behavior is normal. Judgment and thought content normal.       Assessment & Plan:  Shingles - Valtrex + lidoderm for pain, erx done - education and supportive care provided

## 2011-03-16 NOTE — Patient Instructions (Signed)
It was good to see you today. You have shingles - treatment and care as discussed - also see below Valtrex antibiotics and lidocaine patch for pain symptoms - Your prescription(s) have been submitted to your pharmacy. Please take as directed and contact our office if you believe you are having problem(s) with the medication(s). Shingles (Herpes Zoster) Shingles is caused by the same virus that causes chicken pox. The first feelings may be pain or tingling. A rash will follow in a couple days. The rash may occur on any area of the body. Long-lasting pain is more likely in an elderly person. It can last months to years. There are medicines that can help prevent pain if you start taking them early. HOME CARE  Place cool cloths on the rash.   Only take medicine as told by your doctor.   Calamine lotion can be used to relieve itchy skin.   Avoid touching:   Babies.   Children with inflamed skin (eczema).   People who have gotten transplanted organs.   People with chronic illnesses, such as leukemia and AIDS.   If the rash is on the face, you may be told to see a specialist. It is very important to keep all appointments. Shingles must be kept away from the eyes, if possible.  GET HELP RIGHT AWAY IF:  There is any pain on the face or eye. This must be followed carefully by your doctor or eye doctor. An infection of part of the eye (cornea) can be very serious. It could lead to blindness.   The medicines do not help.   The redness or puffiness (swelling) spreads.   You or your child has a temperature by mouth above 101, not controlled by medicine.   Your baby is older than 3 months with a rectal temperature of 102 F (38.9 C) or higher.   Your baby is 44 months old or younger with a rectal temperature of 100.4 F (38 C) or higher.   You notice any red lines going away from the rash area.  MAKE SURE YOU:  Understand these instructions.   Will watch this condition.   Will get  help right away if you or your child is not doing well or gets worse.  Document Released: 12/28/2007 Document Re-Released: 10/05/2009 [facility] Patient Information 2011 [place], [place].

## 2011-03-29 ENCOUNTER — Ambulatory Visit (INDEPENDENT_AMBULATORY_CARE_PROVIDER_SITE_OTHER): Payer: Medicare Other | Admitting: Internal Medicine

## 2011-03-29 ENCOUNTER — Encounter: Payer: Self-pay | Admitting: Internal Medicine

## 2011-03-29 VITALS — BP 112/62 | HR 57 | Temp 98.5°F | Ht 62.0 in | Wt 158.8 lb

## 2011-03-29 DIAGNOSIS — R269 Unspecified abnormalities of gait and mobility: Secondary | ICD-10-CM

## 2011-03-29 DIAGNOSIS — B029 Zoster without complications: Secondary | ICD-10-CM

## 2011-03-29 NOTE — Patient Instructions (Signed)
It was good to see you today. Your shingles have healed as they are supposed to be healing - continue calamine as needed Will work on [employer] for your daughter as discussed and call when these are completed we'll make referral to [facility] for physical therapy and walker as needed. Our office will contact you regarding appointment(s) once made.

## 2011-03-29 NOTE — Progress Notes (Signed)
  Subjective:    Patient ID: [name], female    DOB: Apr 07, 1929, 77 y.o.   MRN: [id number]  HPI  Seen here 8/22 for shingles R flank/groin tx with valtrex -  blisters now dry, ?scarring - no draining No new lesions Uses calamine as needed - completed valtrex last 24h  dtr requests FMLA form completion to asst mom as needed when ill Also requests [facility] for therapy and ?Walker due to balance issues (none recent)  Past Medical History  Diagnosis Date  . 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 ANEMIA   . DEPRESSION   . DIABETES MELLITUS, TYPE II   . DYSLIPIDEMIA   . HYPERTENSION   . PVD   . INSOMNIA, CHRONIC   . Dementia     Review of Systems  Respiratory: Negative for cough and shortness of breath.   Cardiovascular: Negative for chest pain.  Musculoskeletal: Negative for joint swelling and gait problem.  Hematological: Does not bruise/bleed easily.       Objective:   Physical Exam  BP 112/62  Pulse 57  Temp(Src) 98.5 F (36.9 C) (Oral)  Ht 5\' 2"  (1.575 m)  Wt 158 lb 12.8 oz (72.031 kg)  BMI 29.04 kg/m2  SpO2 96% Constitutional: She appears well-developed and well-nourished. No distress. Dtr at side Neck: Normal range of motion. Neck supple. No JVD present. No thyromegaly present.  Cardiovascular: Normal rate, regular rhythm and normal heart sounds.  No murmur heard. No BLE edema. Pulmonary/Chest: Effort normal and breath sounds normal. No respiratory distress. She has no wheezes. Skin: healed dried evidence of recent shingles over lower right flank and groin: skin is warm and dry. No other rash noted. No erythema or ulceration.  Psychiatric: She has a normal mood and affect. Her behavior is normal. Judgment and thought content normal.       Assessment & Plan:  Shingles - resolved  Gait disorder, chronic with hx falls - order HHPT and DME/walker as needed   Will complete FMLA forms for dtr to assist in mom's care as needed - Time spent with pt/family  today 25 minutes, greater than 50% time spent counseling on shingles and form completion and medication review. Also review of prior records

## 2011-03-31 DIAGNOSIS — Z0279 Encounter for issue of other medical certificate: Secondary | ICD-10-CM

## 2011-04-06 ENCOUNTER — Telehealth: Payer: Self-pay | Admitting: *Deleted

## 2011-04-06 DIAGNOSIS — R269 Unspecified abnormalities of gait and mobility: Secondary | ICD-10-CM

## 2011-04-06 NOTE — Telephone Encounter (Signed)
Verbal ok --  Otherwise please let me know what [facility] agency is making request if new order needed - thanks

## 2011-04-06 NOTE — Telephone Encounter (Signed)
Left msg stating saw pt yesterday for PT evaluation. Requesting a referral for nurse to come out to evaluate skin issues & med and diabetic teaching. Also Occupational to help with daily daily activities such as bathing. Pls call office @ [phone].Marland KitchenMarland Kitchen9/12/12@10 :12am/LMB

## 2011-04-06 NOTE — Telephone Encounter (Signed)
[facility]ed [facility] spoke with Alease she states need referral order to be fax in @ [phone].Marland KitchenMarland Kitchen9/12/12@2 :49pm/LMB

## 2011-04-06 NOTE — Telephone Encounter (Signed)
Received msg from daughter stating [facility] drop off wrong walker. Pt is needing a walker with the seat. They contacted [facility] and was told md has to place new order for walker with a seat. Then they can pick old walker up & delivery the one with seat.Marland KitchenMarland KitchenMarland Kitchen9/12/12@4 :03pm/LMB

## 2011-04-18 ENCOUNTER — Other Ambulatory Visit: Payer: Self-pay | Admitting: Internal Medicine

## 2011-06-01 ENCOUNTER — Other Ambulatory Visit (INDEPENDENT_AMBULATORY_CARE_PROVIDER_SITE_OTHER): Payer: Medicare Other

## 2011-06-01 ENCOUNTER — Encounter: Payer: Self-pay | Admitting: Internal Medicine

## 2011-06-01 ENCOUNTER — Ambulatory Visit (INDEPENDENT_AMBULATORY_CARE_PROVIDER_SITE_OTHER): Payer: Medicare Other | Admitting: Internal Medicine

## 2011-06-01 VITALS — BP 130/70 | HR 64 | Temp 98.3°F | Ht 62.0 in | Wt 155.8 lb

## 2011-06-01 DIAGNOSIS — M545 Low back pain: Secondary | ICD-10-CM

## 2011-06-01 DIAGNOSIS — R634 Abnormal weight loss: Secondary | ICD-10-CM

## 2011-06-01 DIAGNOSIS — E119 Type 2 diabetes mellitus without complications: Secondary | ICD-10-CM

## 2011-06-01 DIAGNOSIS — Z23 Encounter for immunization: Secondary | ICD-10-CM

## 2011-06-01 LAB — URINALYSIS
Ketones, ur: NEGATIVE
Leukocytes, UA: NEGATIVE
Specific Gravity, Urine: 1.025 (ref 1.000–1.030)
Urine Glucose: NEGATIVE
Urobilinogen, UA: 0.2 (ref 0.0–1.0)

## 2011-06-01 LAB — CBC WITH DIFFERENTIAL/PLATELET
Basophils Absolute: 0 10*3/uL (ref 0.0–0.1)
Basophils Relative: 0.3 % (ref 0.0–3.0)
Eosinophils Absolute: 0 10*3/uL (ref 0.0–0.7)
Lymphocytes Relative: 21.5 % (ref 12.0–46.0)
MCHC: 33.5 g/dL (ref 30.0–36.0)
MCV: 94.2 fl (ref 78.0–100.0)
Monocytes Absolute: 0.4 10*3/uL (ref 0.1–1.0)
Neutro Abs: 5 10*3/uL (ref 1.4–7.7)
Neutrophils Relative %: 72.1 % (ref 43.0–77.0)
RBC: 3.61 Mil/uL — ABNORMAL LOW (ref 3.87–5.11)
RDW: 16.1 % — ABNORMAL HIGH (ref 11.5–14.6)

## 2011-06-01 LAB — HEPATIC FUNCTION PANEL
AST: 15 U/L (ref 0–37)
Alkaline Phosphatase: 170 U/L — ABNORMAL HIGH (ref 39–117)
Bilirubin, Direct: 0 mg/dL (ref 0.0–0.3)
Total Bilirubin: 0.5 mg/dL (ref 0.3–1.2)

## 2011-06-01 LAB — BASIC METABOLIC PANEL
CO2: 22 mEq/L (ref 19–32)
Calcium: 8.7 mg/dL (ref 8.4–10.5)
Chloride: 110 mEq/L (ref 96–112)
Creatinine, Ser: 2 mg/dL — ABNORMAL HIGH (ref 0.4–1.2)
Glucose, Bld: 80 mg/dL (ref 70–99)

## 2011-06-01 LAB — HEMOGLOBIN A1C: Hgb A1c MFr Bld: 12.8 % — ABNORMAL HIGH (ref 4.6–6.5)

## 2011-06-01 LAB — TSH: TSH: 1.44 u[IU]/mL (ref 0.35–5.50)

## 2011-06-01 MED ORDER — HYDROCODONE-ACETAMINOPHEN 5-500 MG PO TABS: 1.0000 | ORAL_TABLET | ORAL | Status: DC | PRN

## 2011-06-01 NOTE — Assessment & Plan Note (Signed)
Ongoing since at least 02/2011 - see OV eval for same - unimproved with conservative care Suspect DDD and/or spinal stenosis, r/o compression fx Check plain film - use vicodin prn - new rx done - If continued problems, may need MRI or Nsurg eval - pt to let us know

## 2011-06-01 NOTE — Assessment & Plan Note (Signed)
Uncontrolled - complicated by noncompliance and confusion Oral meds "stopped" 10/2010 and resumed Lantus after UC eval 10/20/10 - increased dose rx'd 10/2010 Pt now lives with her dtr (diane) who will be helping pt with her meds -  Check a1c now and pt/family to call if sugar over 200 - Lab Results  Component Value Date   HGBA1C 11.9* 02/09/2011

## 2011-06-01 NOTE — Progress Notes (Signed)
  Subjective:    Patient ID: [name], female    DOB: 08/18/1928, 77 y.o.   MRN: [id number]  HPI  complains of L low back pain  Ongoing >4 months - No falls or trauma Denies radiation into LLE or pelvis No fever  Also reviewed chronic medical issues:  uncontrolled DM - history of noncompliance due to confusion Long hx same - variable cbgs - high >500, never <150; now in 200s per dtr Ongoing confusion about medications, esp insulin - had stopped same fall 2011 due to noncompliance but resumed 10/2010 dtr reports also taking pills and using insulin only prn denies hypoglycemia symptoms or events; +PU/PD - no fever or CP, no abd pain or n/v/d checks cbgs sporatically at home dtr lays out meds into pill box "but i forget to take them"  CAD - hosp x 2 in Mar 2011 and April 2011 for NQWMI notes and labs from hosp reviewed - no further syncopre, no CP, no edema  anemia - continues to feel weak without change - taking iron as rx'd = no adv Se reported report colo 06/2009 reviewed - neg  HTN - reports compliance with ongoing medical treatment. denies adverse side effects related to current therapy.   dyslipidemia- rx atorva -reports compliance with ongoing medical treatment; denies adverse side effects related to current therapy.   Past Medical History  Diagnosis Date  . 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 ANEMIA   . DEPRESSION   . DIABETES MELLITUS, TYPE II   . DYSLIPIDEMIA   . HYPERTENSION   . PVD   . INSOMNIA, CHRONIC   . Dementia      Review of Systems  Respiratory: Negative for cough and shortness of breath.   Genitourinary: Positive for urgency and frequency.  Musculoskeletal: Positive for back pain. Negative for joint swelling.  Also see HPI above -     Objective:   Physical Exam BP 130/70  Pulse 64  Temp(Src) 98.3 F (36.8 C) (Oral)  Ht 5\' 2"  (1.575 m)  Wt 155 lb 12.8 oz (70.67 kg)  BMI 28.50 kg/m2  SpO2 97% Wt Readings from Last 3 Encounters:    06/01/11 155 lb 12.8 oz (70.67 kg)  03/29/11 158 lb 12.8 oz (72.031 kg)  02/25/11 163 lb 1.9 oz (73.991 kg)   Constitutional: She appears well-developed and well-nourished. No distress. dtr at side Neck: Normal range of motion. Neck supple. No JVD present. No thyromegaly present.  Cardiovascular: Normal rate, regular rhythm and normal heart sounds.  No murmur heard. No BLE edema. Pulmonary/Chest: Effort normal and breath sounds normal. No respiratory distress. She has no wheezes.  Abdominal: Soft. Bowel sounds are normal. She exhibits no distension. There is no tenderness. no masses Musculoskeletal: Back: full range of motion of thoracic and lumbar spine. Non tender to palpation over vertebrae. DTR's are symmetrically intact. Sensation intact in all dermatomes of the lower extremities. Full strength to manual muscle testing. patient ambulates with forward bend and antalgic gait. Normal range of motion, no joint effusions. No gross deformities Psychiatric: She has a normal mood and affect. Her behavior is normal. Judgment and thought content normal.       Assessment & Plan:  See problem list. Medications and labs reviewed today.  Weight loss - ?uncontrolled DM or other - check labs - see below

## 2011-06-01 NOTE — Patient Instructions (Signed)
It was good to see you today. Test(s) ordered today. Your results will be called to you after review (48-72hours after test completion). If any changes need to be made, you will be notified at that time. Use Vicodin as needed for back pain symptoms - Your prescription(s) have been submitted to your pharmacy. Please take as directed and contact our office if you believe you are having problem(s) with the medication(s). Other medications reviewed, no other changes at this time.  If pain symptoms worse or unimproved, call us for additional testing as needed/as discussed Please schedule followup in 3-4 months for diabetes mellitus check, call sooner if problems.

## 2011-06-06 ENCOUNTER — Ambulatory Visit (INDEPENDENT_AMBULATORY_CARE_PROVIDER_SITE_OTHER): Payer: Medicare Other | Admitting: Endocrinology

## 2011-06-06 ENCOUNTER — Other Ambulatory Visit: Payer: Self-pay | Admitting: Internal Medicine

## 2011-06-06 ENCOUNTER — Encounter: Payer: Self-pay | Admitting: Endocrinology

## 2011-06-06 ENCOUNTER — Ambulatory Visit (INDEPENDENT_AMBULATORY_CARE_PROVIDER_SITE_OTHER)
Admission: RE | Admit: 2011-06-06 | Discharge: 2011-06-06 | Disposition: A | Discharge status: Home / Self Care | Payer: Medicare Other | Source: Ambulatory Visit | Attending: Internal Medicine | Admitting: Internal Medicine

## 2011-06-06 DIAGNOSIS — M545 Low back pain: Secondary | ICD-10-CM

## 2011-06-06 DIAGNOSIS — E119 Type 2 diabetes mellitus without complications: Secondary | ICD-10-CM

## 2011-06-06 MED ORDER — INSULIN NPH (HUMAN) (ISOPHANE) 100 UNIT/ML ~~LOC~~ SUSP: 20.0000 [IU] | Freq: Every day | SUBCUTANEOUS | Status: DC

## 2011-06-06 NOTE — Patient Instructions (Addendum)
good diet and exercise habits significanly improve the control of your diabetes.  please let me know if you wish to be referred to a dietician.  high blood sugar is very risky to your health.  you should see an eye doctor every year. controlling your blood pressure and cholesterol drastically reduces the damage diabetes does to your body.  this also applies to quitting smoking.  please discuss these with your doctor.  you should take an aspirin every day, unless you have been advised by a doctor not to. check your blood sugar 2 times a day.  vary the time of day when you check, between before the 3 meals, and at bedtime.  also check if you have symptoms of your blood sugar being too high or too low.  please keep a record of the readings and bring it to your next appointment here.  please call us sooner if your blood sugar goes below 70, or if it stays over 200.  Here are some papers to write it down on.   we will need to take this complex situation in stages. Stop metformin Change lantus to nph insulin, 20 units each morning.   Please come back for a follow-up appointment in 1-2 weeks

## 2011-06-06 NOTE — Progress Notes (Signed)
Subjective:    Patient ID: [name], female    DOB: [birth year], 77 y.o.   MRN: [id number]  HPI pt states approx 20 years h/o dm.  it is complicated by cad.  he has been on insulin x 10 years.  she takes lantus and metformin.  no cbg record, but states cbg's vary from 120-600.  It is in general higher as the day goes on.  pt says his diet is good, but exercise is limited by health probs. On further questioning, dtr says pt takes lantus only approx once a week--she takes it prn severe hyperglycemia.  Insulin is drawn up by dtr, who is not always there.  Pt says she often skips breakfast.    Symptomatically, she reports of moderate low-back pain, but no assoc numbness.   Past Medical History  Diagnosis Date  . 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 ANEMIA   . DEPRESSION   . DIABETES MELLITUS, TYPE II   . DYSLIPIDEMIA   . HYPERTENSION   . PVD   . INSOMNIA, CHRONIC   . Dementia     Past Surgical History  Procedure Date  . Lumpectomy (r) breast 2000  . Eye surgery     Cataracts (R) eye 06/1999 & (L) eye 09/2008- [place]  . Coronary artery bypass graft   . Capsulectomy 12/27/2010    L eye    History   Social History  . Marital Status: Widowed    Spouse Name: N/A    Number of Children: N/A  . Years of Education: N/A   Occupational History  . Not on file.   Social History Main Topics  . Smoking status: Former Smoker    Types: Cigarettes  . Smokeless tobacco: Former Neurosurgeon    Quit date: 07/26/1987   Comment: Smoked for approx 64yrs up 1 ppd. Lives alone with her dog. Very active in her church chair. Volunteers at [employer] [facility]  . Alcohol Use: No  . Drug Use: No  . Sexually Active: Not on file   Other Topics Concern  . Not on file   Social History Narrative   Lives alone with her dog. Very active in her church chair. Volunteers at [employer] [facility]    Current Outpatient Prescriptions on File Prior to Visit  Medication Sig Dispense Refill  . amLODipine  (NORVASC) 10 MG tablet TAKE 1 TABLET BY MOUTH EVERY DAY  30 tablet  5  . aspirin 325 MG tablet Take 325 mg by mouth daily.        Marland Kitchen atorvastatin (LIPITOR) 20 MG tablet TAKE 1 TABLET BY MOUTH ONCE DAILY  30 tablet  5  . BYSTOLIC 5 MG tablet TAKE 1 TABLET BY MOUTH EVERY DAY  30 tablet  5  . clopidogrel (PLAVIX) 75 MG tablet TAKE 1 TABLET BY MOUTH ONCE DAILY  30 tablet  5  . DiphenhydrAMINE HCl, Sleep, (UNISOM SLEEPGELS) 50 MG CAPS Take by mouth at bedtime as needed.        . donepezil (ARICEPT) 5 MG tablet TAKE 1 TABLET BY MOUTH EVERY NIGHT AT BEDTIME  30 tablet  PRN  . famotidine (PEPCID) 20 MG tablet TAKE 1 TABLET BY MOUTH EVERY NIGHT AT BEDTIME  30 tablet  5  . hydrochlorothiazide (MICROZIDE) 12.5 MG capsule TAKE 1 CAPSULE BY MOUTH ONCE DAILY  30 capsule  5  . HYDROcodone-acetaminophen (VICODIN) 5-500 MG per tablet Take 1 tablet by mouth every 4 (four) hours as needed for  pain.  20 tablet  0  . insulin glargine (LANTUS) 100 UNIT/ML injection Inject 30 Units into the skin at bedtime.  10 mL  11  . metFORMIN (GLUCOPHAGE) 1000 MG tablet TAKE 1 TABLET BY MOUTH TWICE DAILY  60 tablet  5  . nitroGLYCERIN (NITROSTAT) 0.4 MG SL tablet Place 0.4 mg under the tongue every 5 (five) minutes as needed.        Marland Kitchen PARoxetine (PAXIL) 10 MG tablet TAKE 1 TABLET BY MOUTH AT BEDTIME  30 tablet  5  . PRODIGY NO CODING BLOOD GLUC test strip USE TO CHECK BLOOD SUGAR 3 TIMES DAILY  100 each  5  . PRODIGY TWIST TOP LANCETS 28G MISC USE TO CHECK BLOOD SUGAR 3 TIMES DAILY  100 each  5    No Known Allergies  Family History  Problem Relation Age of Onset  . Coronary artery disease Father   no one has dm  BP 144/68  Pulse 56  Temp(Src) 97.9 F (36.6 C) (Oral)  Ht 5\' 2"  (1.575 m)  Wt 166 lb 12.8 oz (75.66 kg)  BMI 30.51 kg/m2  SpO2 99%  Review of Systems denies weight loss, blurry vision, chest pain, n/v, urinary frequency, cramps, excessive diaphoresis, memory loss, depression, hypoglycemia, and rhinorrhea.   She has intermittent headache.  She reports doe and easy bruising.      Objective:   Physical Exam VS: see vs page GEN: no distress HEAD: head: no deformity eyes: no periorbital swelling, no proptosis external nose and ears are normal mouth: no lesion seen NECK: supple, thyroid is not enlarged CHEST WALL: no deformity LUNGS:  Clear to auscultation CV: reg rate and rhythm, no murmur MUSCULOSKELETAL: muscle bulk and strength are grossly normal.  no obvious joint swelling.  gait is normal and steady EXTEMITIES: no deformity.  no openulcer on the feet.  However, there are 2 healed ulcera at the instep of the left foot.  feet are of normal color and temp.  no edema PULSES: dorsalis pedis intact bilat.  no carotid bruit NEURO:  cn 2-12 grossly intact.   readily moves all 4's.  sensation is intact to touch on the feet SKIN:  Normal texture and temperature.  No rash or suspicious lesion is visible.   NODES:  None palpable at the neck PSYCH: alert, oriented x3.  Does not appear anxious nor depressed.    Lab Results  Component Value Date   HGBA1C 12.8* 06/01/2011      Assessment & Plan:  Insulin-requiring dm.  i agree with dr Felicity Coyer that she is at high risk for complications at this a1c.  The pattern of cbg's indicates the need for a shorter-acting insulin.  CAD: in view of this, we should avoid hypoglycemia Low-back pain: this limits exercise rx of dm.  Memory loss.  In view of this, she may be limited to a simple insulin regimen.

## 2011-06-07 ENCOUNTER — Telehealth: Payer: Self-pay | Admitting: *Deleted

## 2011-06-07 NOTE — Telephone Encounter (Signed)
Either is ok with me--same dosage

## 2011-06-07 NOTE — Telephone Encounter (Signed)
Pt called and left message stating that she was unable to pick up rx for Humulin N insulin . Called insurance company and Novolin N is preferred alternative over Humulin N-please advise

## 2011-06-08 MED ORDER — INSULIN NPH (HUMAN) (ISOPHANE) 100 UNIT/ML ~~LOC~~ SUSP: 20.0000 [IU] | Freq: Every day | SUBCUTANEOUS | Status: DC

## 2011-06-08 NOTE — Telephone Encounter (Signed)
Novolin N insulin sent to [facility], pt informed.

## 2011-06-21 ENCOUNTER — Ambulatory Visit (INDEPENDENT_AMBULATORY_CARE_PROVIDER_SITE_OTHER): Payer: Medicare Other | Admitting: Endocrinology

## 2011-06-21 ENCOUNTER — Encounter: Payer: Self-pay | Admitting: Endocrinology

## 2011-06-21 VITALS — BP 110/60 | HR 67 | Temp 97.9°F | Ht 62.0 in | Wt 158.4 lb

## 2011-06-21 DIAGNOSIS — E119 Type 2 diabetes mellitus without complications: Secondary | ICD-10-CM

## 2011-06-21 MED ORDER — INSULIN NPH (HUMAN) (ISOPHANE) 100 UNIT/ML ~~LOC~~ SUSP: 20.0000 [IU] | Freq: Every day | SUBCUTANEOUS | Status: DC

## 2011-06-21 NOTE — Patient Instructions (Addendum)
check your blood sugar 2 times a day.  vary the time of day when you check, between before the 3 meals, and at bedtime.  also check if you have symptoms of your blood sugar being too high or too low.  please keep a record of the readings and bring it to your next appointment here.  please call us sooner if your blood sugar goes below 70, or if it stays over 200.  Here are some papers to write it down on.   we will need to take this complex situation in stages.   Change lantus to nph insulin, 20 units each morning.   Please come back for a follow-up appointment in 2 weeks.

## 2011-06-21 NOTE — Progress Notes (Signed)
Subjective:    Patient ID: [name], female    DOB: [birth year], 77 y.o.   MRN: [id number]  HPI Pt returns for f/u of insulin-requiring DM (1992).  She is still on the lantus.  She takes lantus 30-60 units on some days, and none on other days.  no cbg record, but states cbg's are often over 400.   Past Medical History  Diagnosis Date  . 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 ANEMIA   . DEPRESSION   . DIABETES MELLITUS, TYPE II   . DYSLIPIDEMIA   . HYPERTENSION   . PVD   . INSOMNIA, CHRONIC   . Dementia     Past Surgical History  Procedure Date  . Lumpectomy (r) breast 2000  . Eye surgery     Cataracts (R) eye 06/1999 & (L) eye 09/2008- [place]  . Coronary artery bypass graft   . Capsulectomy 12/27/2010    L eye    History   Social History  . Marital Status: Widowed    Spouse Name: N/A    Number of Children: N/A  . Years of Education: N/A   Occupational History  . Not on file.   Social History Main Topics  . Smoking status: Former Smoker    Types: Cigarettes  . Smokeless tobacco: Former Neurosurgeon    Quit date: 07/26/1987   Comment: Smoked for approx 7yrs up 1 ppd. Lives alone with her dog. Very active in her church chair. Volunteers at [employer] [facility]  . Alcohol Use: No  . Drug Use: No  . Sexually Active: Not on file   Other Topics Concern  . Not on file   Social History Narrative   Lives alone with her dog. Very active in her church chair. Volunteers at [employer] [facility]    Current Outpatient Prescriptions on File Prior to Visit  Medication Sig Dispense Refill  . amLODipine (NORVASC) 10 MG tablet TAKE 1 TABLET BY MOUTH EVERY DAY  30 tablet  5  . aspirin 325 MG tablet Take 325 mg by mouth daily.        Marland Kitchen atorvastatin (LIPITOR) 20 MG tablet TAKE 1 TABLET BY MOUTH ONCE DAILY  30 tablet  5  . BYSTOLIC 5 MG tablet TAKE 1 TABLET BY MOUTH EVERY DAY  30 tablet  5  . clopidogrel (PLAVIX) 75 MG tablet TAKE 1 TABLET BY MOUTH ONCE DAILY  30 tablet  5  .  DiphenhydrAMINE HCl, Sleep, (UNISOM SLEEPGELS) 50 MG CAPS Take by mouth at bedtime as needed.        . donepezil (ARICEPT) 5 MG tablet TAKE 1 TABLET BY MOUTH EVERY NIGHT AT BEDTIME  30 tablet  PRN  . famotidine (PEPCID) 20 MG tablet TAKE 1 TABLET BY MOUTH EVERY NIGHT AT BEDTIME  30 tablet  5  . hydrochlorothiazide (MICROZIDE) 12.5 MG capsule TAKE 1 CAPSULE BY MOUTH ONCE DAILY  30 capsule  5  . HYDROcodone-acetaminophen (VICODIN) 5-500 MG per tablet Take 1 tablet by mouth every 4 (four) hours as needed for pain.  20 tablet  0  . nitroGLYCERIN (NITROSTAT) 0.4 MG SL tablet Place 0.4 mg under the tongue every 5 (five) minutes as needed.        Marland Kitchen PARoxetine (PAXIL) 10 MG tablet TAKE 1 TABLET BY MOUTH AT BEDTIME  30 tablet  5  . PRODIGY NO CODING BLOOD GLUC test strip USE TO CHECK BLOOD SUGAR 3 TIMES DAILY  100 each  5  .  PRODIGY TWIST TOP LANCETS 28G MISC USE TO CHECK BLOOD SUGAR 3 TIMES DAILY  100 each  5   No Known Allergies  Family History  Problem Relation Age of Onset  . Coronary artery disease Father    BP 110/60  Pulse 67  Temp(Src) 97.9 F (36.6 C) (Oral)  Ht 5\' 2"  (1.575 m)  Wt 158 lb 6 oz (71.838 kg)  BMI 28.97 kg/m2  SpO2 97%  Review of Systems denies hypoglycemia    Objective:   Physical Exam VITAL SIGNS:  See vs page GENERAL: no distress SKIN:  Insulin injection sites at the anterior abdomen are normal.      Assessment & Plan:  DM, therapy limited by severe noncompliance.  i'll do the best i can.

## 2011-07-05 ENCOUNTER — Ambulatory Visit: Payer: Medicare Other | Admitting: Endocrinology

## 2011-07-10 ENCOUNTER — Encounter (HOSPITAL_COMMUNITY): Payer: Self-pay | Admitting: *Deleted

## 2011-07-10 ENCOUNTER — Inpatient Hospital Stay (HOSPITAL_COMMUNITY)
Admission: EM | Admit: 2011-07-10 | Discharge: 2011-07-13 | DRG: 684 | Disposition: A | Discharge status: Home / Self Care | Payer: Medicare Other | Attending: Internal Medicine | Admitting: Internal Medicine

## 2011-07-10 ENCOUNTER — Other Ambulatory Visit: Payer: Self-pay

## 2011-07-10 ENCOUNTER — Emergency Department (HOSPITAL_COMMUNITY): Payer: Medicare Other

## 2011-07-10 DIAGNOSIS — R404 Transient alteration of awareness: Secondary | ICD-10-CM

## 2011-07-10 DIAGNOSIS — R55 Syncope and collapse: Secondary | ICD-10-CM | POA: Diagnosis present

## 2011-07-10 DIAGNOSIS — F329 Major depressive disorder, single episode, unspecified: Secondary | ICD-10-CM | POA: Diagnosis present

## 2011-07-10 DIAGNOSIS — I10 Essential (primary) hypertension: Secondary | ICD-10-CM | POA: Diagnosis present

## 2011-07-10 DIAGNOSIS — Z23 Encounter for immunization: Secondary | ICD-10-CM

## 2011-07-10 DIAGNOSIS — I252 Old myocardial infarction: Secondary | ICD-10-CM

## 2011-07-10 DIAGNOSIS — G4730 Sleep apnea, unspecified: Secondary | ICD-10-CM | POA: Diagnosis present

## 2011-07-10 DIAGNOSIS — E860 Dehydration: Secondary | ICD-10-CM | POA: Diagnosis present

## 2011-07-10 DIAGNOSIS — D649 Anemia, unspecified: Secondary | ICD-10-CM | POA: Diagnosis present

## 2011-07-10 DIAGNOSIS — Z79899 Other long term (current) drug therapy: Secondary | ICD-10-CM

## 2011-07-10 DIAGNOSIS — E785 Hyperlipidemia, unspecified: Secondary | ICD-10-CM | POA: Diagnosis present

## 2011-07-10 DIAGNOSIS — N179 Acute kidney failure, unspecified: Principal | ICD-10-CM | POA: Diagnosis present

## 2011-07-10 DIAGNOSIS — IMO0001 Reserved for inherently not codable concepts without codable children: Secondary | ICD-10-CM | POA: Diagnosis present

## 2011-07-10 DIAGNOSIS — Z87891 Personal history of nicotine dependence: Secondary | ICD-10-CM

## 2011-07-10 DIAGNOSIS — Z7902 Long term (current) use of antithrombotics/antiplatelets: Secondary | ICD-10-CM

## 2011-07-10 DIAGNOSIS — I739 Peripheral vascular disease, unspecified: Secondary | ICD-10-CM | POA: Diagnosis present

## 2011-07-10 DIAGNOSIS — R531 Weakness: Secondary | ICD-10-CM

## 2011-07-10 DIAGNOSIS — Z7982 Long term (current) use of aspirin: Secondary | ICD-10-CM

## 2011-07-10 DIAGNOSIS — I2510 Atherosclerotic heart disease of native coronary artery without angina pectoris: Secondary | ICD-10-CM | POA: Diagnosis present

## 2011-07-10 DIAGNOSIS — F068 Other specified mental disorders due to known physiological condition: Secondary | ICD-10-CM | POA: Diagnosis present

## 2011-07-10 DIAGNOSIS — Z794 Long term (current) use of insulin: Secondary | ICD-10-CM

## 2011-07-10 DIAGNOSIS — E119 Type 2 diabetes mellitus without complications: Secondary | ICD-10-CM

## 2011-07-10 DIAGNOSIS — Z951 Presence of aortocoronary bypass graft: Secondary | ICD-10-CM

## 2011-07-10 DIAGNOSIS — R5381 Other malaise: Secondary | ICD-10-CM | POA: Diagnosis present

## 2011-07-10 DIAGNOSIS — F3289 Other specified depressive episodes: Secondary | ICD-10-CM | POA: Diagnosis present

## 2011-07-10 DIAGNOSIS — R5383 Other fatigue: Secondary | ICD-10-CM | POA: Diagnosis present

## 2011-07-10 MED ORDER — SODIUM CHLORIDE 0.9 % IV SOLN
999.0000 mL | INTRAVENOUS | Status: DC
  Administered 2011-07-11: 999 mL via INTRAVENOUS

## 2011-07-10 NOTE — ED Provider Notes (Signed)
History     CSN: [id number] Arrival date & time: 07/10/2011  9:51 PM   First MD Initiated Contact with Patient 07/10/11 2206      Chief Complaint  Patient presents with  . Weakness    (Consider location/radiation/quality/duration/timing/severity/associated sxs/prior treatment) HPI Patient presents to the emergency room with an episode of altered mental status. Family states for the last week she seemed somewhat lethargic and weak. Tonight however the family states she had an episode of unresponsiveness that  lasted for 15-20 minutes.  Patient seemed to be staring off into space and her mouth was foaming slightly. They did not notice any seizure activity. The patient has not had any coughing, fever, vomiting or diarrhea. She does have history of back problems and is going to receive injections for that. That was not particularly bothering her this evening. Patient currently denies any complaints. Past Medical History  Diagnosis Date  . 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 ANEMIA   . DEPRESSION   . DIABETES MELLITUS, TYPE II   . DYSLIPIDEMIA   . HYPERTENSION   . PVD   . INSOMNIA, CHRONIC   . Dementia     Past Surgical History  Procedure Date  . Lumpectomy (r) breast 2000  . Eye surgery     Cataracts (R) eye 06/1999 & (L) eye 09/2008- [place]  . Coronary artery bypass graft   . Capsulectomy 12/27/2010    L eye    Family History  Problem Relation Age of Onset  . Coronary artery disease Father     History  Substance Use Topics  . Smoking status: Former Smoker    Types: Cigarettes  . Smokeless tobacco: Former Neurosurgeon    Quit date: 07/26/1987   Comment: Smoked for approx 89yrs up 1 ppd. Lives alone with her dog. Very active in her church chair. Volunteers at [employer] [facility]  . Alcohol Use: No    OB History    Grav Para Term Preterm Abortions TAB SAB Ect Mult Living                  Review of Systems  All other systems reviewed and are  negative.    Allergies  Review of patient's allergies indicates no known allergies.  Home Medications   Current Outpatient Rx  Name Route Sig Dispense Refill  . AMLODIPINE BESYLATE 10 MG PO TABS  TAKE 1 TABLET BY MOUTH EVERY DAY 30 tablet 5  . ASPIRIN 325 MG PO TABS Oral Take 325 mg by mouth daily.     . ATORVASTATIN CALCIUM 20 MG PO TABS  TAKE 1 TABLET BY MOUTH ONCE DAILY 30 tablet 5  . BYSTOLIC 5 MG PO TABS  TAKE 1 TABLET BY MOUTH EVERY DAY 30 tablet 5  . CLOPIDOGREL BISULFATE 75 MG PO TABS  TAKE 1 TABLET BY MOUTH ONCE DAILY 30 tablet 5  . DONEPEZIL HCL 5 MG PO TABS  TAKE 1 TABLET BY MOUTH EVERY NIGHT AT BEDTIME 30 tablet PRN  . FAMOTIDINE 20 MG PO TABS  TAKE 1 TABLET BY MOUTH EVERY NIGHT AT BEDTIME 30 tablet 5  . HYDROCHLOROTHIAZIDE 12.5 MG PO CAPS  TAKE 1 CAPSULE BY MOUTH ONCE DAILY 30 capsule 5  . INSULIN ISOPHANE HUMAN 100 UNIT/ML [place] SUSP Subcutaneous Inject 20 Units into the skin daily before breakfast. 10 mL 12  . METFORMIN HCL 1000 MG PO TABS Oral Take 1,000 mg by mouth 2 (two) times daily with a meal.      .  PAROXETINE HCL 10 MG PO TABS  TAKE 1 TABLET BY MOUTH AT BEDTIME 30 tablet 5    BP 132/50  Pulse 60  Temp(Src) 98.5 F (36.9 C) (Oral)  Resp 18  SpO2 99%  Physical Exam  Nursing note and vitals reviewed. Constitutional: She appears well-developed and well-nourished. She appears lethargic.  Non-toxic appearance. She does not have a sickly appearance. No distress.  HENT:  Head: Normocephalic and atraumatic.  Right Ear: External ear normal.  Left Ear: External ear normal.  Eyes: Conjunctivae are normal. Right eye exhibits no discharge. Left eye exhibits no discharge. No scleral icterus.  Neck: Neck supple. No tracheal deviation present.  Cardiovascular: Normal rate, regular rhythm and intact distal pulses.   Pulmonary/Chest: Effort normal and breath sounds normal. No stridor. No respiratory distress. She has no wheezes. She has no rales.  Abdominal: Soft. Bowel  sounds are normal. She exhibits no distension. There is no tenderness. There is no rebound and no guarding.  Musculoskeletal: She exhibits no edema and no tenderness.  Neurological: She has normal strength. She appears lethargic. No sensory deficit. Cranial nerve deficit:  no gross defecits noted. She exhibits normal muscle tone. She displays no seizure activity.       No facial droop, extraocular movements intact, general weakness throughout turgor equal grip strength, able to lift both legs off the bed, sensation light touch intact  Skin: Skin is warm and dry. No rash noted.    ED Course  Procedures (including critical care time)  Date: 07/10/2011  Rate: 53  Rhythm: normal sinus rhythm  QRS Axis: left  Intervals: normal  ST/T Wave abnormalities: normal  Conduction Disutrbances:none  Narrative Interpretation:   Old EKG Reviewed: no signficant changes    Labs Reviewed  CBC  DIFFERENTIAL  COMPREHENSIVE METABOLIC PANEL  PROTIME-INR  APTT  URINALYSIS, ROUTINE W REFLEX MICROSCOPIC  URINE CULTURE  I-STAT TROPONIN I   Dg Chest 2 View  07/10/2011  *RADIOLOGY REPORT*  Clinical Data: Weakness, shortness of breath  CHEST - 2 VIEW  Comparison: 08/24/2010  Findings: Chronic interstitial markings.  No frank interstitial edema.  No pleural effusion or pneumothorax.  Cardiomegaly.  Mild degenerative changes of the visualized thoracolumbar spine.  IMPRESSION: Cardiomegaly with chronic interstitial markings.  No frank interstitial edema.  Original Report Authenticated By: Charline Bills, M.D.     No diagnosis found.    MDM  Pt with complaints of lethargy and syncopal vs seizure episode this evening.  Denies pain at this time.   11:54 PM pending lab and CT evaluation.  Will turn case over to Dr. Norlene Campbell.          Celene Kras, MD 07/10/11 [phone]

## 2011-07-10 NOTE — ED Notes (Signed)
Per EMS - pt from home - lives w/ family - family reports pt has had generalized weakness and lethargy x1 week. Pt admits to nausea no vomiting. Unknown fever.

## 2011-07-10 NOTE — ED Notes (Signed)
[id number]<QM> Expected date:07/10/11<BR> Expected time: 9:44 PM<BR> Means of arrival:Ambulance<BR> Comments:<BR> M241 - 82yoF Weakness x1 wk

## 2011-07-11 ENCOUNTER — Encounter (HOSPITAL_COMMUNITY): Payer: Self-pay | Admitting: Urology

## 2011-07-11 ENCOUNTER — Emergency Department (HOSPITAL_COMMUNITY): Payer: Medicare Other

## 2011-07-11 ENCOUNTER — Other Ambulatory Visit (HOSPITAL_COMMUNITY): Payer: Medicare Other

## 2011-07-11 LAB — BASIC METABOLIC PANEL
BUN: 31 mg/dL — ABNORMAL HIGH (ref 6–23)
Calcium: 9.1 mg/dL (ref 8.4–10.5)
Chloride: 102 mEq/L (ref 96–112)
Creatinine, Ser: 1.69 mg/dL — ABNORMAL HIGH (ref 0.50–1.10)
GFR calc Af Amer: 32 mL/min — ABNORMAL LOW (ref >90)

## 2011-07-11 LAB — CBC
HCT: 32 % — ABNORMAL LOW (ref 36.0–46.0)
HCT: 32.7 % — ABNORMAL LOW (ref 36.0–46.0)
MCH: 30.3 pg (ref 26.0–34.0)
MCV: 90.8 fL (ref 78.0–100.0)
Platelets: 211 10*3/uL (ref 150–400)
Platelets: 227 10*3/uL (ref 150–400)
RDW: 13.6 % (ref 11.5–15.5)
RDW: 13.5 % (ref 11.5–15.5)
WBC: 5.8 10*3/uL (ref 4.0–10.5)

## 2011-07-11 LAB — COMPREHENSIVE METABOLIC PANEL
ALT: 25 U/L (ref 0–35)
AST: 36 U/L (ref 0–37)
CO2: 18 mEq/L — ABNORMAL LOW (ref 19–32)
Chloride: 103 mEq/L (ref 96–112)
GFR calc non Af Amer: 26 mL/min — ABNORMAL LOW (ref >90)
Sodium: 133 mEq/L — ABNORMAL LOW (ref 135–145)
Total Bilirubin: 0.1 mg/dL — ABNORMAL LOW (ref 0.3–1.2)

## 2011-07-11 LAB — GLUCOSE, CAPILLARY: Glucose-Capillary: 442 mg/dL — ABNORMAL HIGH (ref 70–99)

## 2011-07-11 LAB — DIFFERENTIAL
Basophils Absolute: 0 10*3/uL (ref 0.0–0.1)
Lymphocytes Relative: 14 % (ref 12–46)
Neutro Abs: 4.4 10*3/uL (ref 1.7–7.7)

## 2011-07-11 LAB — PROTIME-INR: INR: 1.1 (ref 0.00–1.49)

## 2011-07-11 LAB — URINALYSIS, ROUTINE W REFLEX MICROSCOPIC
Bilirubin Urine: NEGATIVE
Hgb urine dipstick: NEGATIVE
Protein, ur: NEGATIVE mg/dL
Specific Gravity, Urine: 1.022 (ref 1.005–1.030)
Urobilinogen, UA: 0.2 mg/dL (ref 0.0–1.0)

## 2011-07-11 LAB — HEMOGLOBIN A1C
Hgb A1c MFr Bld: 14.4 % — ABNORMAL HIGH (ref <5.7)
Mean Plasma Glucose: 367 mg/dL — ABNORMAL HIGH (ref <117)

## 2011-07-11 MED ORDER — INSULIN ASPART 100 UNIT/ML ~~LOC~~ SOLN
0.0000 [IU] | Freq: Three times a day (TID) | SUBCUTANEOUS | Status: DC
  Administered 2011-07-11: 5 [IU] via SUBCUTANEOUS
  Administered 2011-07-11: 9 [IU] via SUBCUTANEOUS
  Administered 2011-07-12 – 2011-07-13 (×3): 3 [IU] via SUBCUTANEOUS
  Administered 2011-07-13: 1 [IU] via SUBCUTANEOUS
  Filled 2011-07-11: qty 3

## 2011-07-11 MED ORDER — ALUM & MAG HYDROXIDE-SIMETH 200-200-20 MG/5ML PO SUSP: 30.0000 mL | Freq: Four times a day (QID) | ORAL | Status: DC | PRN

## 2011-07-11 MED ORDER — ACETAMINOPHEN 650 MG RE SUPP: 650.0000 mg | Freq: Four times a day (QID) | RECTAL | Status: DC | PRN

## 2011-07-11 MED ORDER — ACETAMINOPHEN 325 MG PO TABS: 650.0000 mg | ORAL_TABLET | Freq: Four times a day (QID) | ORAL | Status: DC | PRN

## 2011-07-11 MED ORDER — ONDANSETRON HCL 4 MG/2ML IJ SOLN: 4.0000 mg | Freq: Four times a day (QID) | INTRAMUSCULAR | Status: DC | PRN

## 2011-07-11 MED ORDER — ZOLPIDEM TARTRATE 5 MG PO TABS: 5.0000 mg | ORAL_TABLET | Freq: Every evening | ORAL | Status: DC | PRN

## 2011-07-11 MED ORDER — SODIUM CHLORIDE 0.9 % IV SOLN
INTRAVENOUS | Status: DC
  Administered 2011-07-11 – 2011-07-13 (×4): via INTRAVENOUS

## 2011-07-11 MED ORDER — ONDANSETRON HCL 4 MG PO TABS: 4.0000 mg | ORAL_TABLET | Freq: Four times a day (QID) | ORAL | Status: DC | PRN

## 2011-07-11 MED ORDER — PNEUMOCOCCAL VAC POLYVALENT 25 MCG/0.5ML IJ INJ
0.5000 mL | INJECTION | INTRAMUSCULAR | Status: AC
  Administered 2011-07-12: 0.5 mL via INTRAMUSCULAR
  Filled 2011-07-11 (×2): qty 0.5

## 2011-07-11 MED ORDER — HYDROMORPHONE HCL PF 1 MG/ML IJ SOLN: 0.5000 mg | INTRAMUSCULAR | Status: DC | PRN

## 2011-07-11 MED ORDER — OXYCODONE HCL 5 MG PO TABS: 5.0000 mg | ORAL_TABLET | ORAL | Status: DC | PRN

## 2011-07-11 MED ORDER — INSULIN ASPART 100 UNIT/ML ~~LOC~~ SOLN
0.0000 [IU] | Freq: Every day | SUBCUTANEOUS | Status: DC
  Administered 2011-07-11: 5 [IU] via SUBCUTANEOUS
  Administered 2011-07-12: 2 [IU] via SUBCUTANEOUS

## 2011-07-11 NOTE — ED Notes (Signed)
Per RN I-Stat troponin cancelled, complete metabolic panel ordered instead

## 2011-07-11 NOTE — Progress Notes (Addendum)
Inpatient Diabetes Program Recommendations  AACE/ADA: New Consensus Statement on Inpatient Glycemic Control (2009)  Target Ranges:  Prepandial:   less than 140 mg/dL      Peak postprandial:   less than 180 mg/dL (1-2 hours)      Critically ill patients:  140 - 180 mg/dL   Reason for Visit: CBG's very high 255 and 406 mg/dL.  Note A1C=14.4% indicating average CBG's at home are approximately 369 mg/dL.  Inpatient Diabetes Program Recommendations Insulin - Basal: Please add Lantus 20 units daily. Insulin - Meal Coverage: Please add Novolog meal coverage 4 units tid with meals (hold if patient eats less than 50%)  Note: Will need change in outpatient diabetes regimen and follow-up with primary MD.  Addendum: Spoke to patient regarding home diabetes care.  She states that her blood glucoses are "always high".  She is on NPH 20 units daily and she states she takes it when she remembers.  She has been to diabetes classes in the past and see's Dr. Felicity Coyer as primary care provider.  Will follow.

## 2011-07-11 NOTE — Progress Notes (Signed)
UR completed 

## 2011-07-11 NOTE — H&P (Addendum)
DATE OF ADMISSION:  07/11/2011  PCP:  Rene Paci, MD, MD   Chief Complaint:   Foaming at mouth and stiffening all over x 30 seconds   HPI: [name] is an 77 y.o. female presenting with complaints of an episode of unresponsiveness, foaming at the mouth and rigidity, witnessed by daughter, lasting 30 seconds.  EMS was called. The episode occurred at 9 PM.  The daughter gives the history and reports that at 6 PM Patient was given her 20 units of insulin when her blood sugar was very high, and the episode occurred at 9 pm, EMS was called and her blood sugar was checked on the scene and found to be 245.  Patient had been having problems regulating her blood sugars, and her diabetic regimen was changed recently.  Patient denies having headache or chest pain or nausea or vomiting or diarrhea.  She has had increased weakness, and lethargy.  The patient denies any previous similar episodes.    Past Medical History  Diagnosis Date  . 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 ANEMIA   . DEPRESSION   . DIABETES MELLITUS, TYPE II   . DYSLIPIDEMIA   . HYPERTENSION   . PVD   . INSOMNIA, CHRONIC   . Dementia     Past Surgical History  Procedure Date  . Lumpectomy (r) breast 2000  . Eye surgery     Cataracts (R) eye 06/1999 & (L) eye 09/2008- [place]  . Coronary artery bypass graft   . Capsulectomy 12/27/2010    L eye    Medications:  HOME MEDS: Prior to Admission medications   Medication Sig Start Date End Date Taking? Authorizing Provider  amLODipine (NORVASC) 10 MG tablet TAKE 1 TABLET BY MOUTH EVERY DAY 04/18/11  Yes Rene Paci, MD  aspirin 325 MG tablet Take 325 mg by mouth daily.    Yes Historical Provider, MD  atorvastatin (LIPITOR) 20 MG tablet TAKE 1 TABLET BY MOUTH ONCE DAILY 04/18/11  Yes Rene Paci, MD  BYSTOLIC 5 MG tablet TAKE 1 TABLET BY MOUTH EVERY DAY 04/18/11  Yes Rene Paci, MD  clopidogrel (PLAVIX) 75 MG tablet TAKE 1 TABLET BY MOUTH ONCE DAILY  04/18/11  Yes Rene Paci, MD  donepezil (ARICEPT) 5 MG tablet TAKE 1 TABLET BY MOUTH EVERY NIGHT AT BEDTIME 01/19/11  Yes Etta Grandchild, MD  famotidine (PEPCID) 20 MG tablet TAKE 1 TABLET BY MOUTH EVERY NIGHT AT BEDTIME 04/18/11  Yes Rene Paci, MD  hydrochlorothiazide (MICROZIDE) 12.5 MG capsule TAKE 1 CAPSULE BY MOUTH ONCE DAILY 04/18/11  Yes Rene Paci, MD  insulin NPH (NOVOLIN N) 100 UNIT/ML injection Inject 20 Units into the skin daily before breakfast. 06/21/11 06/20/12 Yes Romero Belling, MD  metFORMIN (GLUCOPHAGE) 1000 MG tablet Take 1,000 mg by mouth 2 (two) times daily with a meal.     Yes Historical Provider, MD  PARoxetine (PAXIL) 10 MG tablet TAKE 1 TABLET BY MOUTH AT BEDTIME 04/18/11  Yes Rene Paci, MD    Allergies:  No Known Allergies  Social History:   reports that she has quit smoking. Her smoking use included Cigarettes. She quit smokeless tobacco use about 23 years ago. She reports that she does not drink alcohol or use illicit drugs.  Family History: Family History  Problem Relation Age of Onset  . Coronary artery disease Father         DM in Mother  Review of Systems:  The patient denies anorexia, fever,  weight loss, vision loss, decreased hearing, hoarseness, chest pain,dyspnea on exertion, peripheral edema, balance deficits, hemoptysis, abdominal pain, melena, hematochezia, severe indigestion/heartburn, hematuria, incontinence, genital sores, muscle weakness, suspicious skin lesions, transient blindness, difficulty walking, depression, unusual weight change, abnormal bleeding, enlarged lymph nodes, angioedema, and breast masses.   Physical Exam:  GEN:  Pleasant Elderly 77 year old African American female examined  and in no acute distress; cooperative with exam Filed Vitals:   07/10/11 2153 07/11/11 0246  BP: 132/50 121/39  Pulse: 60 52  Temp: 98.5 F (36.9 C) 98.2 F (36.8 C)  TempSrc: Oral Oral  Resp: 18 16  SpO2: 99% 96%   Blood  pressure 121/39, pulse 52, temperature 98.2 F (36.8 C), temperature source Oral, resp. rate 16, SpO2 96.00%. PSYCH: SHe is alert and oriented x4; does not appear anxious does not appear depressed; affect is normal HEENT: Normocephalic and Atraumatic, Mucous membranes pink; PERRLA; EOM intact; Fundi:  Benign;  No scleral icterus, Nares: Patent, Oropharynx: Clear,  Neck:  FROM, no cervical lymphadenopathy nor thyromegaly or carotid bruit; no JVD; Breasts:: Not examined CHEST WALL: No tenderness CHEST: Normal respiration, clear to auscultation bilaterally HEART: Regular rate and rhythm; no murmurs rubs or gallops BACK: No kyphosis or scoliosis; no CVA tenderness ABDOMEN: Positive Bowel Sounds, soft non-tender; no masses, no organomegaly. Rectal Exam: Not done EXTREMITIES: No bone or joint deformity; age-appropriate arthropathy of the hands and knees; no cyanosis, clubbing or edema; no ulcerations. Genitalia: not examined PULSES: 2+ and symmetric SKIN: Normal hydration no rash or ulceration CNS: Cranial nerves 2-12 grossly intact no focal neurologic deficit   Labs & Imaging Results for orders placed during the hospital encounter of 07/10/11 (from the past 48 hour(s))  CBC     Status: Abnormal   Collection Time   07/10/11 10:57 PM      Component Value Range Comment   WBC 5.8  4.0 - 10.5 (K/uL)    RBC 3.50 (*) 3.87 - 5.11 (MIL/uL)    Hemoglobin 10.7 (*) 12.0 - 15.0 (g/dL)    HCT 16.1 (*) 09.6 - 46.0 (%)    MCV 91.4  78.0 - 100.0 (fL)    MCH 30.6  26.0 - 34.0 (pg)    MCHC 33.4  30.0 - 36.0 (g/dL)    RDW 04.5  40.9 - 81.1 (%)    Platelets 211  150 - 400 (K/uL)   DIFFERENTIAL     Status: Normal   Collection Time   07/10/11 10:57 PM      Component Value Range Comment   Neutrophils Relative 75  43 - 77 (%)    Neutro Abs 4.4  1.7 - 7.7 (K/uL)    Lymphocytes Relative 14  12 - 46 (%)    Lymphs Abs 0.8  0.7 - 4.0 (K/uL)    Monocytes Relative 10  3 - 12 (%)    Monocytes Absolute 0.6  0.1 -  1.0 (K/uL)    Eosinophils Relative 1  0 - 5 (%)    Eosinophils Absolute 0.1  0.0 - 0.7 (K/uL)    Basophils Relative 0  0 - 1 (%)    Basophils Absolute 0.0  0.0 - 0.1 (K/uL)   COMPREHENSIVE METABOLIC PANEL     Status: Abnormal   Collection Time   07/10/11 10:57 PM      Component Value Range Comment   Sodium 133 (*) 135 - 145 (mEq/L)    Potassium 5.3 (*) 3.5 - 5.1 (mEq/L) MODERATE HEMOLYSIS   Chloride  103  96 - 112 (mEq/L)    CO2 18 (*) 19 - 32 (mEq/L)    Glucose, Bld 207 (*) 70 - 99 (mg/dL)    BUN 31 (*) 6 - 23 (mg/dL)    Creatinine, Ser 4.09 (*) 0.50 - 1.10 (mg/dL)    Calcium 8.8  8.4 - 10.5 (mg/dL)    Total Protein 7.4  6.0 - 8.3 (g/dL)    Albumin 3.2 (*) 3.5 - 5.2 (g/dL)    AST 36  0 - 37 (U/L) MODERATE HEMOLYSIS   ALT 25  0 - 35 (U/L)    Alkaline Phosphatase 222 (*) 39 - 117 (U/L)    Total Bilirubin 0.1 (*) 0.3 - 1.2 (mg/dL)    GFR calc non Af Amer 26 (*) >90 (mL/min)    GFR calc Af Amer 30 (*) >90 (mL/min)   APTT     Status: Normal   Collection Time   07/11/11  1:55 AM      Component Value Range Comment   aPTT 30  24 - 37 (seconds)   PROTIME-INR     Status: Normal   Collection Time   07/11/11  1:55 AM      Component Value Range Comment   Prothrombin Time 14.4  11.6 - 15.2 (seconds)    INR 1.10  0.00 - 1.49    POCT I-STAT TROPONIN I     Status: Normal   Collection Time   07/11/11  2:00 AM      Component Value Range Comment   Troponin i, poc 0.00  0.00 - 0.08 (ng/mL)    Comment 3            URINALYSIS, ROUTINE W REFLEX MICROSCOPIC     Status: Abnormal   Collection Time   07/11/11  3:56 AM      Component Value Range Comment   Color, Urine YELLOW  YELLOW     APPearance CLEAR  CLEAR     Specific Gravity, Urine 1.022  1.005 - 1.030     pH 5.0  5.0 - 8.0     Glucose, UA >1000 (*) NEGATIVE (mg/dL)    Hgb urine dipstick NEGATIVE  NEGATIVE     Bilirubin Urine NEGATIVE  NEGATIVE     Ketones, ur NEGATIVE  NEGATIVE (mg/dL)    Protein, ur NEGATIVE  NEGATIVE (mg/dL)     Urobilinogen, UA 0.2  0.0 - 1.0 (mg/dL)    Nitrite NEGATIVE  NEGATIVE     Leukocytes, UA NEGATIVE  NEGATIVE    URINE MICROSCOPIC-ADD ON     Status: Normal   Collection Time   07/11/11  3:56 AM      Component Value Range Comment   Urine-Other AMORPHOUS URATES/PHOSPHATES      Dg Chest 2 View  07/10/2011  *RADIOLOGY REPORT*  Clinical Data: Weakness, shortness of breath  CHEST - 2 VIEW  Comparison: 08/24/2010  Findings: Chronic interstitial markings.  No frank interstitial edema.  No pleural effusion or pneumothorax.  Cardiomegaly.  Mild degenerative changes of the visualized thoracolumbar spine.  IMPRESSION: Cardiomegaly with chronic interstitial markings.  No frank interstitial edema.  Original Report Authenticated By: Charline Bills, M.D.   Ct Head Wo Contrast  07/11/2011  *RADIOLOGY REPORT*  Clinical Data: Altered mental status, weakness  CT HEAD WITHOUT CONTRAST  Technique:  Contiguous axial images were obtained from the base of the skull through the vertex without contrast.  Comparison:  CT head dated 10/11/2009  Findings: No evidence of parenchymal hemorrhage or  extra-axial fluid collection. No mass lesion, mass effect, or midline shift.  No CT evidence of acute infarction.  Subcortical white matter and periventricular small vessel ischemic changes.  Intracranial atherosclerosis.  Global cortical atrophy.  No ventriculomegaly.  The visualized paranasal sinuses are essentially clear. The mastoid air cells are unopacified.  No evidence of calvarial fracture.  IMPRESSION: No evidence of acute intracranial abnormality.  Atrophy with small vessel ischemic changes and intracranial atherosclerosis.  Original Report Authenticated By: Charline Bills, M.D.      Assessment/Plan: 1.  Seizure versus TIA 2.  Generalized Weakness 3.  Uncontrolled DM2/hyperglycemia 4.  Mild Hyponatremia 5. Mild anemia 6.  HTN 7. CAD hx.    Plan:      Seizure workup initiated, and neurologic checks  ordered.  An EEG study has been ordered.  IVFs have been ordered and urine electrolyes and osm wi.l be checked.  SSI coverage has been ordered, and patients regular meds have been reviewed and will be reconciled.  DVT prophlylaxis has been ordered.  Other plans as per orders.    CODE STATUS:      FULL CODE      Archer Moist C 07/11/2011, 6:04 AM

## 2011-07-11 NOTE — ED Notes (Signed)
Pt experienced dizziness upon sitting and standing.

## 2011-07-11 NOTE — Progress Notes (Signed)
77 year old pleasant lady admitted for unresponsiveness and is being evaluated for TIA vs seizures. She denies any new complaints. Will w/u for the above. Awaiting EEG.

## 2011-07-11 NOTE — ED Provider Notes (Signed)
Care assumed at change of shift from Dr. Lynelle Doctor. Patient with one week of generalized weakness and lethargy. Tonight patient had 20 minute episode of unresponsiveness. Patient is pending CT of head, and lab work. Admission is anticipated.  Olivia Mackie, MD 07/11/11 [phone]

## 2011-07-12 ENCOUNTER — Inpatient Hospital Stay (HOSPITAL_COMMUNITY)
Admit: 2011-07-12 | Discharge: 2011-07-12 | Disposition: A | Discharge status: Home / Self Care | Payer: Medicare Other | Attending: Internal Medicine | Admitting: Internal Medicine

## 2011-07-12 LAB — BASIC METABOLIC PANEL
BUN: 22 mg/dL (ref 6–23)
Chloride: 110 mEq/L (ref 96–112)
Creatinine, Ser: 1.31 mg/dL — ABNORMAL HIGH (ref 0.50–1.10)
GFR calc Af Amer: 43 mL/min — ABNORMAL LOW (ref >90)
GFR calc non Af Amer: 37 mL/min — ABNORMAL LOW (ref >90)
Glucose, Bld: 124 mg/dL — ABNORMAL HIGH (ref 70–99)
Potassium: 3.8 mEq/L (ref 3.5–5.1)

## 2011-07-12 LAB — RETICULOCYTES
RBC.: 3.49 MIL/uL — ABNORMAL LOW (ref 3.87–5.11)
Retic Count, Absolute: 34.9 10*3/uL (ref 19.0–186.0)
Retic Ct Pct: 1 % (ref 0.4–3.1)

## 2011-07-12 LAB — GLUCOSE, CAPILLARY
Glucose-Capillary: 431 mg/dL — ABNORMAL HIGH (ref 70–99)
Glucose-Capillary: 92 mg/dL (ref 70–99)
Glucose-Capillary: 241 mg/dL — ABNORMAL HIGH (ref 70–99)
Glucose-Capillary: 203 mg/dL — ABNORMAL HIGH (ref 70–99)
Glucose-Capillary: 207 mg/dL — ABNORMAL HIGH (ref 70–99)

## 2011-07-12 LAB — CBC
HCT: 32 % — ABNORMAL LOW (ref 36.0–46.0)
Hemoglobin: 10.8 g/dL — ABNORMAL LOW (ref 12.0–15.0)
MCH: 30.6 pg (ref 26.0–34.0)
MCHC: 33.8 g/dL (ref 30.0–36.0)
RDW: 13.7 % (ref 11.5–15.5)

## 2011-07-12 LAB — URINE CULTURE: Culture: NO GROWTH

## 2011-07-12 LAB — FERRITIN: Ferritin: 92 ng/mL (ref 10–291)

## 2011-07-12 LAB — IRON AND TIBC: TIBC: 294 ug/dL (ref 250–470)

## 2011-07-12 LAB — VITAMIN B12: Vitamin B-12: 498 pg/mL (ref 211–911)

## 2011-07-12 MED ORDER — PAROXETINE HCL 20 MG PO TABS
20.0000 mg | ORAL_TABLET | Freq: Every day | ORAL | Status: DC
  Administered 2011-07-12 – 2011-07-13 (×2): 20 mg via ORAL
  Filled 2011-07-12 (×3): qty 1

## 2011-07-12 MED ORDER — AMLODIPINE BESYLATE 10 MG PO TABS
10.0000 mg | ORAL_TABLET | Freq: Every day | ORAL | Status: DC
  Administered 2011-07-12 – 2011-07-13 (×2): 10 mg via ORAL
  Filled 2011-07-12 (×3): qty 1

## 2011-07-12 MED ORDER — CLOPIDOGREL BISULFATE 75 MG PO TABS
75.0000 mg | ORAL_TABLET | Freq: Every day | ORAL | Status: DC
  Administered 2011-07-12 – 2011-07-13 (×2): 75 mg via ORAL
  Filled 2011-07-12 (×3): qty 1

## 2011-07-12 MED ORDER — INSULIN NPH (HUMAN) (ISOPHANE) 100 UNIT/ML ~~LOC~~ SUSP
20.0000 [IU] | Freq: Every day | SUBCUTANEOUS | Status: DC
  Administered 2011-07-12 – 2011-07-13 (×2): 20 [IU] via SUBCUTANEOUS
  Filled 2011-07-12: qty 10

## 2011-07-12 MED ORDER — ASPIRIN 325 MG PO TABS
325.0000 mg | ORAL_TABLET | Freq: Every day | ORAL | Status: DC
  Administered 2011-07-12 – 2011-07-13 (×2): 325 mg via ORAL
  Filled 2011-07-12 (×3): qty 1

## 2011-07-12 MED ORDER — INSULIN ASPART 100 UNIT/ML ~~LOC~~ SOLN
5.0000 [IU] | Freq: Once | SUBCUTANEOUS | Status: AC
  Administered 2011-07-12: 5 [IU] via SUBCUTANEOUS

## 2011-07-12 MED ORDER — HYDRALAZINE HCL 25 MG PO TABS
25.0000 mg | ORAL_TABLET | Freq: Three times a day (TID) | ORAL | Status: DC
  Administered 2011-07-12 – 2011-07-13 (×4): 25 mg via ORAL
  Filled 2011-07-12 (×9): qty 1

## 2011-07-12 NOTE — Progress Notes (Signed)
Subjective: Pt states she feels much better , denies any new complaints.  Her daughter at beside.   Objective: Weight change:   Intake/Output Summary (Last 24 hours) at 07/12/11 1908 Last data filed at 07/12/11 1700  Gross per 24 hour  Intake   1140 ml  Output   1050 ml  Net     90 ml   Pt is alert and afebrile, comfortable CVS s1 and s2 heard Lungs clear Abdomen: soft non tender non distended  Extremities: no pedal edema Neuro: no new focal deficits.   Lab Results: Results for orders placed during the hospital encounter of 07/10/11 (from the past 24 hour(s))  GLUCOSE, CAPILLARY     Status: Abnormal   Collection Time   07/11/11  9:56 PM      Component Value Range   Glucose-Capillary 431 (*) 70 - 99 (mg/dL)  GLUCOSE, CAPILLARY     Status: Abnormal   Collection Time   07/12/11 12:08 AM      Component Value Range   Glucose-Capillary 333 (*) 70 - 99 (mg/dL)   Comment 1 Documented in Chart     Comment 2 Notify RN    GLUCOSE, CAPILLARY     Status: Abnormal   Collection Time   07/12/11  1:50 AM      Component Value Range   Glucose-Capillary 241 (*) 70 - 99 (mg/dL)   Comment 1 Documented in Chart     Comment 2 Notify RN    BASIC METABOLIC PANEL     Status: Abnormal   Collection Time   07/12/11  3:55 AM      Component Value Range   Sodium 139  135 - 145 (mEq/L)   Potassium 3.8  3.5 - 5.1 (mEq/L)   Chloride 110  96 - 112 (mEq/L)   CO2 21  19 - 32 (mEq/L)   Glucose, Bld 124 (*) 70 - 99 (mg/dL)   BUN 22  6 - 23 (mg/dL)   Creatinine, Ser 1.61 (*) 0.50 - 1.10 (mg/dL)   Calcium 8.7  8.4 - 09.6 (mg/dL)   GFR calc non Af Amer 37 (*) >90 (mL/min)   GFR calc Af Amer 43 (*) >90 (mL/min)  CBC     Status: Abnormal   Collection Time   07/12/11  3:55 AM      Component Value Range   WBC 3.7 (*) 4.0 - 10.5 (K/uL)   RBC 3.53 (*) 3.87 - 5.11 (MIL/uL)   Hemoglobin 10.8 (*) 12.0 - 15.0 (g/dL)   HCT 04.5 (*) 40.9 - 46.0 (%)   MCV 90.7  78.0 - 100.0 (fL)   MCH 30.6  26.0 - 34.0 (pg)     MCHC 33.8  30.0 - 36.0 (g/dL)   RDW 81.1  91.4 - 78.2 (%)   Platelets 197  150 - 400 (K/uL)  RETICULOCYTES     Status: Abnormal   Collection Time   07/12/11  3:55 AM      Component Value Range   Retic Ct Pct 1.0  0.4 - 3.1 (%)   RBC. 3.49 (*) 3.87 - 5.11 (MIL/uL)   Retic Count, Manual 34.9  19.0 - 186.0 (K/uL)  GLUCOSE, CAPILLARY     Status: Normal   Collection Time   07/12/11  7:58 AM      Component Value Range   Glucose-Capillary 92  70 - 99 (mg/dL)  GLUCOSE, CAPILLARY     Status: Abnormal   Collection Time   07/12/11 11:23 AM  Component Value Range   Glucose-Capillary 241 (*) 70 - 99 (mg/dL)  GLUCOSE, CAPILLARY     Status: Abnormal   Collection Time   07/12/11  4:36 PM      Component Value Range   Glucose-Capillary 203 (*) 70 - 99 (mg/dL)    Micro Results: Recent Results (from the past 240 hour(s))  URINE CULTURE     Status: Normal   Collection Time   07/11/11  3:56 AM      Component Value Range Status Comment   Specimen Description URINE, CATHETERIZED   Final    Special Requests NONE   Final    Setup Time [id number]   Final    Colony Count NO GROWTH   Final    Culture NO GROWTH   Final    Report Status 07/12/2011 FINAL   Final     Studies/Results: Dg Chest 2 View  07/10/2011  *RADIOLOGY REPORT*  Clinical Data: Weakness, shortness of breath  CHEST - 2 VIEW  Comparison: 08/24/2010  Findings: Chronic interstitial markings.  No frank interstitial edema.  No pleural effusion or pneumothorax.  Cardiomegaly.  Mild degenerative changes of the visualized thoracolumbar spine.  IMPRESSION: Cardiomegaly with chronic interstitial markings.  No frank interstitial edema.  Original Report Authenticated By: Charline Bills, M.D.   Ct Head Wo Contrast  07/11/2011  *RADIOLOGY REPORT*  Clinical Data: Altered mental status, weakness  CT HEAD WITHOUT CONTRAST  Technique:  Contiguous axial images were obtained from the base of the skull through the vertex without contrast.   Comparison: [place] CT head dated 10/11/2009  Findings: No evidence of parenchymal hemorrhage or extra-axial fluid collection. No mass lesion, mass effect, or midline shift.  No CT evidence of acute infarction.  Subcortical white matter and periventricular small vessel ischemic changes.  Intracranial atherosclerosis.  Global cortical atrophy.  No ventriculomegaly.  The visualized paranasal sinuses are essentially clear. The mastoid air cells are unopacified.  No evidence of calvarial fracture.  IMPRESSION: No evidence of acute intracranial abnormality.  Atrophy with small vessel ischemic changes and intracranial atherosclerosis.  Original Report Authenticated By: Charline Bills, M.D.   Medications: Scheduled Meds:   . amLODipine  10 mg Oral Daily  . aspirin  325 mg Oral Daily  . clopidogrel  75 mg Oral Q breakfast  . hydrALAZINE  25 mg Oral Q8H  . insulin aspart  0-5 Units Subcutaneous QHS  . insulin aspart  0-9 Units Subcutaneous TID WC  . insulin aspart  5 Units Subcutaneous Once  . insulin NPH  20 Units Subcutaneous Daily  . PARoxetine  20 mg Oral Daily  . pneumococcal 23 valent vaccine  0.5 mL Intramuscular Tomorrow-1000   Continuous Infusions:   . sodium chloride 999 mL (07/11/11 0004)  . sodium chloride 75 mL/hr at 07/12/11 1639   PRN Meds:.acetaminophen, acetaminophen, alum & mag hydroxide-simeth, HYDROmorphone, ondansetron (ZOFRAN) IV, ondansetron, oxyCODONE, zolpidem  Assessment/Plan:  Altered mental Status: resolved. PT is being worked for possible Seizures. Pt did not have any more seizure episodes during hospitalization. Pt had an EEG done results pending.  CAD: continue with aspirin and plavix Hypertension: controlled Hyperlipidemia: continue with pravachol Dementia : at baseline CKD: stage 3: improving. Continue with PT/OT .   LOS: 2 days   [name] 07/12/2011, 7:08 PM

## 2011-07-12 NOTE — Progress Notes (Signed)
Physical Therapy Evaluation Patient Details Name: [name] MRN: [id number] DOB: October 27, 1928 Today's Date: 07/12/2011 Time: 4098-1191  Eval II Problem List:  Patient Active Problem List  Diagnoses  . DIABETES MELLITUS, TYPE II  . DYSLIPIDEMIA  . ANEMIA  . INSOMNIA, CHRONIC  . DEPRESSION  . HYPERTENSION  . CAD  . PVD  . SYNCOPE  . MEMORY LOSS  . PERSONAL HISTORY OF MALIGNANT NEOPLASM OF BREAST  . Low back pain    Past Medical History:  Past Medical History  Diagnosis Date  . 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 ANEMIA   . DEPRESSION   . DIABETES MELLITUS, TYPE II   . DYSLIPIDEMIA   . HYPERTENSION   . PVD   . INSOMNIA, CHRONIC   . Dementia    Past Surgical History:  Past Surgical History  Procedure Date  . Lumpectomy (r) breast 2000  . Eye surgery     Cataracts (R) eye 06/1999 & (L) eye 09/2008- [place]  . Coronary artery bypass graft   . Capsulectomy 12/27/2010    L eye    PT Assessment/Plan/Recommendation PT Assessment Clinical Impression Statement: Pt presents with diagnosis of TIA vs seizure. Pt will benefit from skilled PT in the acute care setting to maximize independence with basic funtional mobility in preperation for D/C home with daughter.  PT Recommendation/Assessment: Patient will need skilled PT in the acute care venue PT Problem List: Decreased mobility;Decreased strength PT Therapy Diagnosis : Generalized weakness PT Plan PT Frequency: Min 3X/week PT Treatment/Interventions: Gait training;Stair training;Functional mobility training;Therapeutic activities;Patient/family education PT Recommendation Follow Up Recommendations: None Equipment Recommended: None recommended by PT PT Goals  Acute Rehab PT Goals PT Goal Formulation: With patient Pt will Ambulate: 51 - 150 feet;with supervision;with least restrictive assistive device (4 wheeled walker) PT Goal: Ambulate - Progress: Not met Pt will Go Up / Down Stairs: 3-5 stairs;with min  assist PT Goal: Up/Down Stairs - Progress: Not met  PT Evaluation Precautions/Restrictions    Prior Functioning  Home Living Lives With: Daughter Receives Help From: Personal care attendant (3-4 hours M-F) Type of Home: Apartment Home Layout: One level Home Access: Stairs to enter (stairs to exit per pt) Entrance Stairs-Rails: None Entrance Stairs-Number of Steps: 4 Home Adaptive Equipment: Walker - four wheeled Prior Function Level of Independence: Independent with basic ADLs;Requires assistive device for independence Cognition Cognition Arousal/Alertness: Awake/alert Overall Cognitive Status: Appears within functional limits for tasks assessed Orientation Level: Oriented X4 Sensation/Coordination Sensation Light Touch: Appears Intact Coordination Gross Motor Movements are Fluid and Coordinated: Yes Extremity Assessment RLE Assessment RLE Assessment: Within Functional Limits LLE Assessment LLE Assessment: Within Functional Limits Mobility (including Balance) Bed Mobility Bed Mobility: Yes Supine to Sit: 5: Supervision;With rails;HOB flat Supine to Sit Details (indicate cue type and reason): Increased time.  Sit to Supine - Left: 5: Supervision Transfers Transfers: Yes Sit to Stand: 5: Supervision Sit to Stand Details (indicate cue type and reason): VCs safety, hand placement.  Stand to Sit: 5: Supervision Stand to Sit Details: VCs safety, hand placement Ambulation/Gait Ambulation/Gait: Yes Ambulation/Gait Assistance: 4: Min assist Ambulation/Gait Assistance Details (indicate cue type and reason): Assist to negotiate with RW-pt usually uses rollator which is easier to steer.  Ambulation Distance (Feet): 125 Feet Assistive device: Rolling walker Gait Pattern: Step-through pattern  Posture/Postural Control Posture/Postural Control: No significant limitations Exercise    End of Session PT - End of Session Equipment Utilized During Treatment: Gait belt Activity  Tolerance: Patient  tolerated treatment well Patient left: in bed;with call bell in reach General Behavior During Session: [facility] for tasks performed Cognition: [facility] for tasks performed  Rebeca Alert [facility] 07/12/2011, 5:14 PM [phone]

## 2011-07-13 ENCOUNTER — Other Ambulatory Visit (HOSPITAL_COMMUNITY): Payer: Medicare Other

## 2011-07-13 DIAGNOSIS — E860 Dehydration: Secondary | ICD-10-CM | POA: Diagnosis present

## 2011-07-13 DIAGNOSIS — N179 Acute kidney failure, unspecified: Secondary | ICD-10-CM | POA: Diagnosis present

## 2011-07-13 LAB — CBC
MCV: 91 fL (ref 78.0–100.0)
Platelets: 204 10*3/uL (ref 150–400)
RBC: 3.34 MIL/uL — ABNORMAL LOW (ref 3.87–5.11)
RDW: 13.6 % (ref 11.5–15.5)
WBC: 4.7 10*3/uL (ref 4.0–10.5)

## 2011-07-13 LAB — BASIC METABOLIC PANEL
CO2: 20 mEq/L (ref 19–32)
Calcium: 8.4 mg/dL (ref 8.4–10.5)
Creatinine, Ser: 1.1 mg/dL (ref 0.50–1.10)
GFR calc Af Amer: 53 mL/min — ABNORMAL LOW (ref >90)
GFR calc non Af Amer: 46 mL/min — ABNORMAL LOW (ref >90)
Sodium: 137 mEq/L (ref 135–145)

## 2011-07-13 LAB — GLUCOSE, CAPILLARY: Glucose-Capillary: 241 mg/dL — ABNORMAL HIGH (ref 70–99)

## 2011-07-13 MED ORDER — INSULIN NPH (HUMAN) (ISOPHANE) 100 UNIT/ML ~~LOC~~ SUSP: 25.0000 [IU] | Freq: Every day | SUBCUTANEOUS | Status: DC

## 2011-07-13 MED ORDER — HYDRALAZINE HCL 25 MG PO TABS: 25.0000 mg | ORAL_TABLET | Freq: Three times a day (TID) | ORAL | Status: DC

## 2011-07-13 NOTE — Procedures (Signed)
REFERRING PHYSICIAN:  Dr. Lovell Sheehan.  HISTORY:  A 77 year old female with episode of unresponsiveness, evaluated to rule out seizure.  MEDICATIONS:  Norvasc, aspirin, Plavix, Apresoline, NovoLog and Paxil.  CONDITIONS OF RECORDING:  This is a 16-channel EEG carried out with the patient in the awake and drowsy states.  DESCRIPTION:  The waking background activity consists of a low-voltage, symmetrical, fairly well-organized, 9 Hz alpha activity seen from the parieto-occipital and posterotemporal regions.  Low-voltage, fast activity, poorly organized was seen and during at times, superimposed on more posterior rhythms.  A mixture of theta and alpha rhythm was seen from the central and temporal regions.  The patient drowses with slowing to irregular which is theta and beta activity.  Stage 2 sleep is not obtained.  Hypoventilation was performed and produced a mild to moderate buildup, but failed to elicit any abnormalities.  Intermittent photic stimulation was not performed.  IMPRESSION:  This is a normal EEG.  No epileptiform activity was noted.          ______________________________ Thana Farr, MD    [id number] D:  07/13/2011 10:30:13  T:  07/13/2011 21:45:58  Job #:  [id number]

## 2011-07-13 NOTE — Progress Notes (Signed)
1745 D/C instructions read to and by pt and granddtr.  Verbalizes understanding of all d/c instructions.  Rx given to granddtr.  No ADN.  D/C to home via w/c with granddtr.

## 2011-07-13 NOTE — Progress Notes (Signed)
Occupational Therapy Evaluation Patient Details Name: [name] MRN: [id number] DOB: [birth year] Today's Date: 07/13/2011 EV1 1230-1300 Problem List:  Patient Active Problem List  Diagnoses  . DIABETES MELLITUS, TYPE II  . DYSLIPIDEMIA  . ANEMIA  . INSOMNIA, CHRONIC  . DEPRESSION  . HYPERTENSION  . CAD  . PVD  . SYNCOPE  . MEMORY LOSS  . PERSONAL HISTORY OF MALIGNANT NEOPLASM OF BREAST  . Low back pain    Past Medical History:  Past Medical History  Diagnosis Date  . 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 ANEMIA   . DEPRESSION   . DIABETES MELLITUS, TYPE II   . DYSLIPIDEMIA   . HYPERTENSION   . PVD   . INSOMNIA, CHRONIC   . Dementia    Past Surgical History:  Past Surgical History  Procedure Date  . Lumpectomy (r) breast 2000  . Eye surgery     Cataracts (R) eye 06/1999 & (L) eye 09/2008- [place]  . Coronary artery bypass graft   . Capsulectomy 12/27/2010    L eye    OT Assessment/Plan/Recommendation OT Assessment Clinical Impression Statement: Pt appears to be functioning at baseline for all ADLs & has prn A at home from family & home health aide. No f/u OT or equip needed. OT Recommendation/Assessment: Patient does not need any further OT services OT Recommendation Equipment Recommended: None recommended by OT OT Goals    OT Evaluation Precautions/Restrictions  Restrictions Weight Bearing Restrictions: No Prior Functioning Home Living Bathroom Shower/Tub: Tub/shower unit Bathroom Toilet: Standard (with vanity beside) Bathroom Accessibility: Yes How Accessible: Accessible via walker Home Adaptive Equipment: Shower chair with back;Walker - four wheeled Prior Function Driving: No Vocation: Retired ADL ADL Grooming: Performed;Wash/dry hands;Denture care;Supervision/safety Where Assessed - Grooming: Standing at sink Upper Body Bathing: Simulated;Set up Where Assessed - Upper Body Bathing: Standing at sink Lower Body Bathing:  Simulated;Supervision/safety Where Assessed - Lower Body Bathing: Sit to stand from bed;Sitting, bed;Unsupported Upper Body Dressing: Set up;Simulated Where Assessed - Upper Body Dressing: Standing Lower Body Dressing: Performed;Supervision/safety Where Assessed - Lower Body Dressing: Sitting, bed;Unsupported;Sit to stand from bed Toilet Transfer: Performed;Supervision/safety Toilet Transfer Method: Proofreader: Regular height toilet Toileting - Clothing Manipulation: Performed;Supervision/safety Where Assessed - Toileting Clothing Manipulation: Sit to stand from 3-in-1 or toilet Toileting - Hygiene: Performed;Supervision/safety Where Assessed - Toileting Hygiene: Sit to stand from 3-in-1 or toilet Tub/Shower Transfer: Not assessed Tub/Shower Transfer Method: Not assessed Equipment Used: Rolling walker Vision/Perception  Vision - History Baseline Vision: Wears glasses all the time Patient Visual Report: No change from baseline Vision - Assessment Vision Assessment: Vision not tested [employer] Arousal/Alertness: Awake/alert Overall Cognitive Status: Appears within functional limits for tasks assessed Orientation Level: Oriented X4 Sensation/Coordination   Extremity Assessment RUE Assessment RUE Assessment: Within Functional Limits LUE Assessment LUE Assessment: Within Functional Limits Mobility  Bed Mobility Supine to Sit: 5: Supervision;HOB flat;With rails Transfers Sit to Stand: 5: Supervision;From bed;From toilet Stand to Sit: 5: Supervision;To chair/3-in-1;To toilet Stand to Sit Details: Occasional VCs for safe technique manipulating RW around bathroom, hand placement when sitting. Exercises   End of Session OT - End of Session Equipment Utilized During Treatment: Other (comment) (RW) Activity Tolerance: Patient tolerated treatment well Patient left: in chair;with call bell in reach;with family/visitor present General Behavior  During Session: [facility] for tasks performed Cognition: [facility] for tasks performed   Keshav Winegar A [phone] 07/13/2011, 1:04 PM

## 2011-07-13 NOTE — Discharge Summary (Signed)
DISCHARGE SUMMARY  [name]  MR#: [id number]  DOB:02/15/1929  Date of Admission: 07/10/2011 Date of Discharge: 07/13/2011  Attending Physician:Emmie Frakes  Patient's WUJ:WJXBJYN Felicity Coyer, MD, MD  Consults: none  Discharge Diagnoses: Present on Admission: Syncope  .Dehydration .Acute renal failure, resolved Diabetes Mellitus, type 2, uncontrolled Hypertension, uncontrolled   Current Discharge Medication List    START taking these medications   Details  hydrALAZINE (APRESOLINE) 25 MG tablet Take 1 tablet (25 mg total) by mouth every 8 (eight) hours. Qty: 90 tablet, Refills: 0      CONTINUE these medications which have CHANGED   Details  insulin NPH (NOVOLIN N) 100 UNIT/ML injection Inject 25 Units into the skin daily before breakfast. Qty: 10 mL, Refills: 12      CONTINUE these medications which have NOT CHANGED   Details  amLODipine (NORVASC) 10 MG tablet TAKE 1 TABLET BY MOUTH EVERY DAY Qty: 30 tablet, Refills: 5    aspirin 325 MG tablet Take 325 mg by mouth daily.     atorvastatin (LIPITOR) 20 MG tablet TAKE 1 TABLET BY MOUTH ONCE DAILY Qty: 30 tablet, Refills: 5    BYSTOLIC 5 MG tablet TAKE 1 TABLET BY MOUTH EVERY DAY Qty: 30 tablet, Refills: 5    clopidogrel (PLAVIX) 75 MG tablet TAKE 1 TABLET BY MOUTH ONCE DAILY Qty: 30 tablet, Refills: 5    donepezil (ARICEPT) 5 MG tablet TAKE 1 TABLET BY MOUTH EVERY NIGHT AT BEDTIME Qty: 30 tablet, Refills: PRN    famotidine (PEPCID) 20 MG tablet TAKE 1 TABLET BY MOUTH EVERY NIGHT AT BEDTIME Qty: 30 tablet, Refills: 5    hydrochlorothiazide (MICROZIDE) 12.5 MG capsule TAKE 1 CAPSULE BY MOUTH ONCE DAILY Qty: 30 capsule, Refills: 5    metFORMIN (GLUCOPHAGE) 1000 MG tablet Take 1,000 mg by mouth 2 (two) times daily with a meal.      PARoxetine (PAXIL) 10 MG tablet TAKE 1 TABLET BY MOUTH AT BEDTIME Qty: 30 tablet, Refills: 5          Hospital Course: Patient was admitted to the hospital for concerns for  possible seizures.  Apparently she was found unresponsive. It is unclear by family at bedside now, whether she had any jerking movements.  On arrival to the emergency room she was found to be in acute renal failure and likely had an element of dehydration.  She was started on IV fluids and her renal function has since normalized.  She had a CT head which was negative.  EEG has been done with results pending at present.  She was monitored on telemetry.  She has not had any recurrence of her symptoms since admission.  She is feeling back to baseline and is requesting to discharge home.  It is likely that dehydration was the main cause of her symptoms.  She has been adivised to take an adequate amount of PO fluids.  She will need to follow up with her primary doctor.  If her EEG is abnormal then she will be called with these results.     Day of Discharge BP 160/72  Pulse 68  Temp(Src) 98.7 F (37.1 C) (Oral)  Resp 18  Ht 5\' 2"  (1.575 m)  Wt 71.215 kg (157 lb)  BMI 28.72 kg/m2  SpO2 97%  Physical Exam: NAD CTA B S1S2 RRR Soft, NT, ND, BS+ No edema b/l Strength equal b/l, cranial nerves intact, gait normal  Results for orders placed during the hospital encounter of 07/10/11 (from the past 24 hour(s))  GLUCOSE, CAPILLARY     Status: Abnormal   Collection Time   07/12/11 10:42 PM      Component Value Range   Glucose-Capillary 207 (*) 70 - 99 (mg/dL)  BASIC METABOLIC PANEL     Status: Abnormal   Collection Time   07/13/11  4:05 AM      Component Value Range   Sodium 137  135 - 145 (mEq/L)   Potassium 3.9  3.5 - 5.1 (mEq/L)   Chloride 107  96 - 112 (mEq/L)   CO2 20  19 - 32 (mEq/L)   Glucose, Bld 115 (*) 70 - 99 (mg/dL)   BUN 12  6 - 23 (mg/dL)   Creatinine, Ser 6.21  0.50 - 1.10 (mg/dL)   Calcium 8.4  8.4 - 30.8 (mg/dL)   GFR calc non Af Amer 46 (*) >90 (mL/min)   GFR calc Af Amer 53 (*) >90 (mL/min)  CBC     Status: Abnormal   Collection Time   07/13/11  4:05 AM      Component  Value Range   WBC 4.7  4.0 - 10.5 (K/uL)   RBC 3.34 (*) 3.87 - 5.11 (MIL/uL)   Hemoglobin 10.0 (*) 12.0 - 15.0 (g/dL)   HCT 65.7 (*) 84.6 - 46.0 (%)   MCV 91.0  78.0 - 100.0 (fL)   MCH 29.9  26.0 - 34.0 (pg)   MCHC 32.9  30.0 - 36.0 (g/dL)   RDW 96.2  95.2 - 84.1 (%)   Platelets 204  150 - 400 (K/uL)  GLUCOSE, CAPILLARY     Status: Abnormal   Collection Time   07/13/11  7:49 AM      Component Value Range   Glucose-Capillary 134 (*) 70 - 99 (mg/dL)  GLUCOSE, CAPILLARY     Status: Abnormal   Collection Time   07/13/11 11:59 AM      Component Value Range   Glucose-Capillary 241 (*) 70 - 99 (mg/dL)  GLUCOSE, CAPILLARY     Status: Abnormal   Collection Time   07/13/11  5:01 PM      Component Value Range   Glucose-Capillary 158 (*) 70 - 99 (mg/dL)    Disposition: Discharged home in stable condition.   Follow-up Appts: Discharge Orders    Future Orders Please Complete By Expires   Diet - low sodium heart healthy      Diet Carb Modified      Increase activity slowly      Call MD for:  persistant dizziness or light-headedness         Follow-up with Dr.Leschber in 1-2 weeks.   Tests Needing Follow-up: EEG results  Signed: Janella Rogala 07/13/2011, 5:06 PM

## 2011-07-13 NOTE — Clinical Documentation Improvement (Signed)
CHANGE MENTAL STATUS DOCUMENTATION CLARIFICATION   THIS DOCUMENT IS NOT A PERMANENT PART OF THE MEDICAL RECORD  TO RESPOND TO THE THIS QUERY, FOLLOW THE INSTRUCTIONS BELOW:  1. If needed, update documentation for the patient's encounter via the notes activity.  2. Access this query again and click edit on the In [employer].  3. After updating, or not, click F2 to complete all highlighted (required) fields concerning your review. Select "additional documentation in the medical record" OR "no additional documentation provided".  4. Click Sign note button.  5. The deficiency will fall out of your In Basket *Please let us know if you are not able to complete this workflow by phone or e-mail (listed below).         07/13/11  Dear Dr. Thurman Coyer Marton Redwood  In an effort to better capture your patient's severity of illness, reflect appropriate length of stay and utilization of resources, a review of the patient medical record has revealed the following indicators.    Based on your clinical judgment, please clarify and document in a progress note and/or discharge summary the clinical condition associated with the following supporting information:  In responding to this query please exercise your independent judgment.  The fact that a query is asked, does not imply that any particular answer is desired or expected.  07/12/11 Prog note.Marland KitchenMarland Kitchen"Altered mental Status: resolved.".Marland KitchenMarland KitchenFor accurate Dx specificity & severity, please help clarify if stated "AMS" can be further specified. Thank you  Possible Clinical Conditions?  _______Encephalopathy (describe type if known)                       Anoxic                       Septic                       Alcoholic                        Hepatic                       Hypertensive                       Metabolic                       _______ Toxic  _______Drug induced confusion/delirium _______Acute confusion _______Acute delirium _______Acute  exacerbation of known dementia (indicate type) _______New diagnosis of Dementia, Alzheimer's, cerebral atherosclerosis  _______Hyponatremia / Hypernatremia _______Poisoning / Overdose _______Hypoxemia / Hypoxia  _______Other Condition__________________ _______Cannot Clinically Determine   Supporting Information: Signs & Symptoms: 07/11/11 prog note..."77 year old pleasant lady admitted for unresponsiveness and is being evaluated for TIA vs seizures.".Marland KitchenMarland Kitchen  Diagnostics: Lab:  Radiology: CT scan: 07/11/11: IMPRESSION: No evidence of acute intracranial abnormality. Atrophy with small vessel ischemic changes and intracranial atherosclerosis.  EEG: 07/12/11>Results Pending  Treatment: 07/11/11: Full workup for TIA vs Seizures in progress  Reviewed:  no additional documentation provided: 07/15/11: DC Summ 07/13/11 rev'd, query closed.ORM  Thank You,  Toribio Harbour, RN, BSN, CCDS Certified Clinical Documentation Specialist Pager: [phone]  Health Information Management 

## 2011-07-22 ENCOUNTER — Ambulatory Visit: Payer: Medicare Other | Admitting: Internal Medicine

## 2011-07-27 ENCOUNTER — Ambulatory Visit (INDEPENDENT_AMBULATORY_CARE_PROVIDER_SITE_OTHER): Payer: Medicare Other | Admitting: Internal Medicine

## 2011-07-27 ENCOUNTER — Encounter: Payer: Self-pay | Admitting: Internal Medicine

## 2011-07-27 DIAGNOSIS — M545 Low back pain: Secondary | ICD-10-CM | POA: Diagnosis not present

## 2011-07-27 DIAGNOSIS — E119 Type 2 diabetes mellitus without complications: Secondary | ICD-10-CM | POA: Diagnosis not present

## 2011-07-27 DIAGNOSIS — R4182 Altered mental status, unspecified: Secondary | ICD-10-CM | POA: Diagnosis not present

## 2011-07-27 MED ORDER — INSULIN NPH (HUMAN) (ISOPHANE) 100 UNIT/ML ~~LOC~~ SUSP: 25.0000 [IU] | Freq: Every day | SUBCUTANEOUS | Status: DC

## 2011-07-27 NOTE — Progress Notes (Signed)
Subjective:    Patient ID: [name], female    DOB: May 21, 1929, 78 y.o.   MRN: [id number]  HPI  Here for hosp follow up - 3 day stay for AMS - labs, images and notes reviewed -  no seizure on EEG 12/16, head CT and labs unremarkable for cause "back to normal" since home  Also reviewed chronic medical issues:  uncontrolled DM - history of noncompliance and medication confusion Long hx same - variable cbgs - high >500, never <150; now in 180s per dtr Ongoing confusion about medications, esp insulin -  had stopped same fall 2011 due to noncompliance but resumed 10/2010 Changed from Lantus to NPH and stopped metformin 06/2011 denies hypoglycemia symptoms or events; +PU/PD - no fever or chest pain, no abd pain or n/v/d checks cbgs sporatically at home  CAD - hosp x 2 in Mar 2011 and April 2011 for NQWMI notes and labs from hosp reviewed - no further syncopre, no chest pain and no edema  anemia - chronic fatigue and weakness but no change taking iron as prescribed, no adverse side effects reported Last colo 06/2009 reviewed - negative for cause of anemia  HTN - reports compliance with ongoing medical treatment. denies adverse side effects related to current therapy.   dyslipidemia- rx atorva -reports compliance with ongoing medical treatment; denies adverse side effects related to current therapy.   Past Medical History  Diagnosis Date  . 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 ANEMIA   . DEPRESSION   . DIABETES MELLITUS, TYPE II   . DYSLIPIDEMIA   . HYPERTENSION   . PVD   . INSOMNIA, CHRONIC   . Dementia      Review of Systems  Respiratory: Negative for cough, chest tightness and shortness of breath.   Cardiovascular: Negative for chest pain and palpitations.  Genitourinary: Negative for urgency, frequency and difficulty urinating.  Musculoskeletal: Positive for back pain (unchanged). Negative for joint swelling.  Neurological: Negative for dizziness, facial  asymmetry and headaches.       Objective:   Physical Exam  BP 142/72  Pulse 51  Temp(Src) 97.9 F (36.6 C) (Oral)  Wt 163 lb (73.936 kg)  SpO2 99% Wt Readings from Last 3 Encounters:  07/27/11 163 lb (73.936 kg)  07/11/11 157 lb (71.215 kg)  06/21/11 158 lb 6 oz (71.838 kg)   Constitutional: She appears well-developed and well-nourished. No distress. dtr at side Neck: Normal range of motion. Neck supple. No JVD present. No thyromegaly present.  Cardiovascular: Normal rate, regular rhythm and normal heart sounds.  No murmur heard. No BLE edema. Pulmonary/Chest: Effort normal and breath sounds normal. No respiratory distress. She has no wheezes.  Psychiatric: She has a normal mood and affect. Her behavior is normal. Judgment and thought content normal.   Lab Results  Component Value Date   WBC 4.7 07/13/2011   HGB 10.0* 07/13/2011   HCT 30.4* 07/13/2011   PLT 204 07/13/2011   GLUCOSE 115* 07/13/2011   CHOL  Value: 126           10/12/2009   TRIG 130 10/12/2009   HDL 27* 10/12/2009   LDLCALC  Value: 73         10/12/2009   ALT 25 07/10/2011   AST 36 07/10/2011   NA 137 07/13/2011   K 3.9 07/13/2011   CL 107 07/13/2011   CREATININE 1.10 07/13/2011   BUN 12 07/13/2011   CO2 20 07/13/2011  TSH 1.44 06/01/2011   INR 1.10 07/11/2011   HGBA1C 14.4* 07/09/2011       Assessment & Plan:  See problem list. Medications and labs reviewed today.  Time spent with pt/family today 25 minutes, greater than 50% time spent counseling patient on medications for diabetes, recent hosp for AMS and upcoming ESI plans for tx of back pain. Also review of hospital records

## 2011-07-27 NOTE — Patient Instructions (Signed)
It was good to see you today. We have reviewed your hospital records including labs and tests today medications reviewed, no changes at this time.  Continue to monitor your sugars at home and call our office if over 200 for adjustment in insulin dosing Okay to proceed with back shot when scheduling permits -  Please schedule followup in 3-4 months for diabetes mellitus check, call sooner if problems.

## 2011-07-27 NOTE — Assessment & Plan Note (Signed)
Uncontrolled - complicated by noncompliance and confusion Oral meds "stopped" 10/2010 and resumed Lantus after UC eval 10/20/10 - increased dose rx'd 10/2010 Then chaged Lantus to [facility] hosp 06/2011 and stopped metformin due to ARI Pt now lives with her dtr (diane) who helps pt with her meds -  Check a1c now and pt/family to call if sugar over 200 - Lab Results  Component Value Date   HGBA1C 14.4* 07/09/2011

## 2011-07-27 NOTE — Assessment & Plan Note (Signed)
Ongoing since at least 02/2011 - see OV eval 05/2011 for same - unimproved with conservative care Suspect DDD and/or spinal stenosis Planning ESI with MW - ok to proceed when scheduling allows

## 2011-08-17 ENCOUNTER — Other Ambulatory Visit: Payer: Self-pay

## 2011-08-17 MED ORDER — "INSULIN SYRINGE 31G X 5/16"" 0.5 ML MISC": 1.0000 | Freq: Every day | Status: DC

## 2011-08-19 DIAGNOSIS — M48061 Spinal stenosis, lumbar region without neurogenic claudication: Secondary | ICD-10-CM | POA: Diagnosis not present

## 2011-08-19 DIAGNOSIS — M5137 Other intervertebral disc degeneration, lumbosacral region: Secondary | ICD-10-CM | POA: Diagnosis not present

## 2011-09-01 DIAGNOSIS — M48061 Spinal stenosis, lumbar region without neurogenic claudication: Secondary | ICD-10-CM | POA: Diagnosis not present

## 2011-09-02 ENCOUNTER — Other Ambulatory Visit: Payer: Self-pay | Admitting: *Deleted

## 2011-09-02 NOTE — Telephone Encounter (Signed)
Received PA form  on pt Novolin n. Called [employer] per rep there system is down and not able to look up pt information. Was told to call back later... 09/02/11@3 :19pm/LMB

## 2011-09-06 ENCOUNTER — Other Ambulatory Visit: Payer: Self-pay

## 2011-09-06 NOTE — Telephone Encounter (Signed)
Pharmacy called to inform MD that Novolin is no longer a covered insulin. Pharmacy is unsure whether a PA or alternate medication is needed but recommends switching to Humulin, please advise.     [name] [phone]  ID [id number]

## 2011-09-06 NOTE — Telephone Encounter (Signed)
Ok to change to Humulin

## 2011-09-06 NOTE — Telephone Encounter (Signed)
contacted insurance spoke with rachel gave information over the phone. She states will received fax concerning status once info has been reviewed by clinical staff in 24-48 hours.... 09/06/11@2 :02pm/LMB

## 2011-09-06 NOTE — Telephone Encounter (Signed)
Per Valentina Gu, pt had been previously Rx this medication and it was not covered as well. PA has been initiated by [employer]

## 2011-09-07 MED ORDER — INSULIN NPH (HUMAN) (ISOPHANE) 100 UNIT/ML ~~LOC~~ SUSP: 25.0000 [IU] | Freq: Every day | SUBCUTANEOUS | Status: DC

## 2011-09-07 NOTE — Telephone Encounter (Signed)
Received PA back Novolin denied. Has to choose between humulin & humalog. Previous msg md change to Humulin. Sent to pharmacy.... 09/07/11@9 :03am/LMB

## 2011-09-09 DIAGNOSIS — M5137 Other intervertebral disc degeneration, lumbosacral region: Secondary | ICD-10-CM | POA: Diagnosis not present

## 2011-09-09 DIAGNOSIS — M48061 Spinal stenosis, lumbar region without neurogenic claudication: Secondary | ICD-10-CM | POA: Diagnosis not present

## 2011-10-06 DIAGNOSIS — M5137 Other intervertebral disc degeneration, lumbosacral region: Secondary | ICD-10-CM | POA: Diagnosis not present

## 2011-10-06 DIAGNOSIS — M48061 Spinal stenosis, lumbar region without neurogenic claudication: Secondary | ICD-10-CM | POA: Diagnosis not present

## 2011-10-25 DIAGNOSIS — M545 Low back pain: Secondary | ICD-10-CM | POA: Diagnosis not present

## 2011-10-26 DIAGNOSIS — E119 Type 2 diabetes mellitus without complications: Secondary | ICD-10-CM | POA: Diagnosis not present

## 2011-10-26 DIAGNOSIS — I2510 Atherosclerotic heart disease of native coronary artery without angina pectoris: Secondary | ICD-10-CM | POA: Diagnosis not present

## 2011-10-26 DIAGNOSIS — E7800 Pure hypercholesterolemia, unspecified: Secondary | ICD-10-CM | POA: Diagnosis not present

## 2011-10-26 DIAGNOSIS — I10 Essential (primary) hypertension: Secondary | ICD-10-CM | POA: Diagnosis not present

## 2011-10-26 DIAGNOSIS — I209 Angina pectoris, unspecified: Secondary | ICD-10-CM | POA: Diagnosis not present

## 2011-10-27 ENCOUNTER — Ambulatory Visit: Payer: Medicare Other

## 2011-11-02 DIAGNOSIS — I2510 Atherosclerotic heart disease of native coronary artery without angina pectoris: Secondary | ICD-10-CM | POA: Diagnosis not present

## 2011-11-02 DIAGNOSIS — E7800 Pure hypercholesterolemia, unspecified: Secondary | ICD-10-CM | POA: Diagnosis not present

## 2011-11-02 DIAGNOSIS — E119 Type 2 diabetes mellitus without complications: Secondary | ICD-10-CM | POA: Diagnosis not present

## 2011-11-02 DIAGNOSIS — I10 Essential (primary) hypertension: Secondary | ICD-10-CM | POA: Diagnosis not present

## 2011-11-03 ENCOUNTER — Other Ambulatory Visit: Payer: Self-pay | Admitting: Internal Medicine

## 2011-11-21 ENCOUNTER — Encounter (HOSPITAL_COMMUNITY): Payer: Self-pay | Admitting: General Practice

## 2011-11-21 ENCOUNTER — Inpatient Hospital Stay (HOSPITAL_COMMUNITY)
Admission: AD | Admit: 2011-11-21 | Discharge: 2011-11-23 | DRG: 287 | Disposition: A | Discharge status: Home / Self Care | Payer: Medicare Other | Source: Ambulatory Visit | Attending: Cardiology | Admitting: Cardiology

## 2011-11-21 DIAGNOSIS — I10 Essential (primary) hypertension: Secondary | ICD-10-CM | POA: Diagnosis not present

## 2011-11-21 DIAGNOSIS — I498 Other specified cardiac arrhythmias: Secondary | ICD-10-CM | POA: Diagnosis present

## 2011-11-21 DIAGNOSIS — I209 Angina pectoris, unspecified: Secondary | ICD-10-CM | POA: Diagnosis present

## 2011-11-21 DIAGNOSIS — Z87891 Personal history of nicotine dependence: Secondary | ICD-10-CM

## 2011-11-21 DIAGNOSIS — Z8249 Family history of ischemic heart disease and other diseases of the circulatory system: Secondary | ICD-10-CM

## 2011-11-21 DIAGNOSIS — Z7902 Long term (current) use of antithrombotics/antiplatelets: Secondary | ICD-10-CM | POA: Diagnosis not present

## 2011-11-21 DIAGNOSIS — Z794 Long term (current) use of insulin: Secondary | ICD-10-CM | POA: Diagnosis not present

## 2011-11-21 DIAGNOSIS — I252 Old myocardial infarction: Secondary | ICD-10-CM | POA: Diagnosis not present

## 2011-11-21 DIAGNOSIS — E7800 Pure hypercholesterolemia, unspecified: Secondary | ICD-10-CM | POA: Diagnosis not present

## 2011-11-21 DIAGNOSIS — E119 Type 2 diabetes mellitus without complications: Secondary | ICD-10-CM | POA: Diagnosis not present

## 2011-11-21 DIAGNOSIS — D638 Anemia in other chronic diseases classified elsewhere: Secondary | ICD-10-CM | POA: Diagnosis present

## 2011-11-21 DIAGNOSIS — I2510 Atherosclerotic heart disease of native coronary artery without angina pectoris: Secondary | ICD-10-CM | POA: Diagnosis not present

## 2011-11-21 DIAGNOSIS — I200 Unstable angina: Secondary | ICD-10-CM | POA: Diagnosis not present

## 2011-11-21 DIAGNOSIS — Z7982 Long term (current) use of aspirin: Secondary | ICD-10-CM

## 2011-11-21 HISTORY — DX: Angina pectoris, unspecified: I20.9

## 2011-11-21 HISTORY — DX: Non-ST elevation (NSTEMI) myocardial infarction: I21.4

## 2011-11-21 HISTORY — DX: Pure hypercholesterolemia, unspecified: E78.00

## 2011-11-21 HISTORY — DX: Chronic kidney disease, stage 4 (severe): N18.4

## 2011-11-21 HISTORY — DX: Anemia in other chronic diseases classified elsewhere: D63.8

## 2011-11-21 HISTORY — DX: Malignant neoplasm of unspecified site of unspecified female breast: C50.919

## 2011-11-21 HISTORY — DX: ST elevation (STEMI) myocardial infarction involving other coronary artery of inferior wall: I21.19

## 2011-11-21 LAB — DIFFERENTIAL
Eosinophils Absolute: 0.2 10*3/uL (ref 0.0–0.7)
Eosinophils Relative: 4 % (ref 0–5)
Lymphs Abs: 1.4 10*3/uL (ref 0.7–4.0)
Monocytes Absolute: 0.4 10*3/uL (ref 0.1–1.0)
Monocytes Relative: 7 % (ref 3–12)

## 2011-11-21 LAB — CBC
HCT: 29.6 % — ABNORMAL LOW (ref 36.0–46.0)
MCH: 29.8 pg (ref 26.0–34.0)
MCV: 88.1 fL (ref 78.0–100.0)
Platelets: 241 10*3/uL (ref 150–400)
RBC: 3.36 MIL/uL — ABNORMAL LOW (ref 3.87–5.11)

## 2011-11-21 LAB — GLUCOSE, CAPILLARY
Glucose-Capillary: 206 mg/dL — ABNORMAL HIGH (ref 70–99)
Glucose-Capillary: 208 mg/dL — ABNORMAL HIGH (ref 70–99)

## 2011-11-21 LAB — COMPREHENSIVE METABOLIC PANEL
BUN: 32 mg/dL — ABNORMAL HIGH (ref 6–23)
CO2: 21 mEq/L (ref 19–32)
Calcium: 9 mg/dL (ref 8.4–10.5)
Creatinine, Ser: 1.81 mg/dL — ABNORMAL HIGH (ref 0.50–1.10)
GFR calc Af Amer: 29 mL/min — ABNORMAL LOW (ref >90)
GFR calc non Af Amer: 25 mL/min — ABNORMAL LOW (ref >90)
Glucose, Bld: 202 mg/dL — ABNORMAL HIGH (ref 70–99)
Total Bilirubin: 0.2 mg/dL — ABNORMAL LOW (ref 0.3–1.2)

## 2011-11-21 LAB — CARDIAC PANEL(CRET KIN+CKTOT+MB+TROPI)
CK, MB: 1.9 ng/mL (ref 0.3–4.0)
CK, MB: 1.8 ng/mL (ref 0.3–4.0)
Total CK: 69 U/L (ref 7–177)

## 2011-11-21 LAB — HEMOGLOBIN A1C
Hgb A1c MFr Bld: 11.7 % — ABNORMAL HIGH (ref <5.7)
Mean Plasma Glucose: 289 mg/dL — ABNORMAL HIGH (ref <117)

## 2011-11-21 LAB — MAGNESIUM: Magnesium: 2.4 mg/dL (ref 1.5–2.5)

## 2011-11-21 LAB — PROTIME-INR: Prothrombin Time: 14 seconds (ref 11.6–15.2)

## 2011-11-21 LAB — TSH: TSH: 2.454 u[IU]/mL (ref 0.350–4.500)

## 2011-11-21 MED ORDER — ASPIRIN 325 MG PO TABS
325.0000 mg | ORAL_TABLET | Freq: Every day | ORAL | Status: DC
  Administered 2011-11-21: 325 mg via ORAL
  Filled 2011-11-21 (×2): qty 1

## 2011-11-21 MED ORDER — FAMOTIDINE 20 MG PO TABS
20.0000 mg | ORAL_TABLET | Freq: Every day | ORAL | Status: DC
  Administered 2011-11-21 – 2011-11-22 (×2): 20 mg via ORAL
  Filled 2011-11-21 (×3): qty 1

## 2011-11-21 MED ORDER — HEPARIN SODIUM (PORCINE) 5000 UNIT/ML IJ SOLN
5000.0000 [IU] | Freq: Three times a day (TID) | INTRAMUSCULAR | Status: DC
  Administered 2011-11-21 – 2011-11-22 (×3): 5000 [IU] via SUBCUTANEOUS
  Filled 2011-11-21 (×6): qty 1

## 2011-11-21 MED ORDER — CLOPIDOGREL BISULFATE 75 MG PO TABS
75.0000 mg | ORAL_TABLET | Freq: Every day | ORAL | Status: DC
  Administered 2011-11-21 – 2011-11-22 (×2): 75 mg via ORAL
  Filled 2011-11-21 (×2): qty 1

## 2011-11-21 MED ORDER — SODIUM CHLORIDE 0.45 % IV SOLN
INTRAVENOUS | Status: DC
  Administered 2011-11-21: 13:00:00 via INTRAVENOUS

## 2011-11-21 MED ORDER — SODIUM BICARBONATE 8.4 % IV SOLN
INTRAVENOUS | Status: DC
  Filled 2011-11-21: qty 1000

## 2011-11-21 MED ORDER — ACETAMINOPHEN 325 MG PO TABS: 650.0000 mg | ORAL_TABLET | ORAL | Status: DC | PRN

## 2011-11-21 MED ORDER — DIAZEPAM 5 MG PO TABS: 5.0000 mg | ORAL_TABLET | ORAL | Status: DC

## 2011-11-21 MED ORDER — SODIUM CHLORIDE 0.9 % IV SOLN
INTRAVENOUS | Status: DC
  Administered 2011-11-21: 22:00:00 via INTRAVENOUS

## 2011-11-21 MED ORDER — PAROXETINE HCL 10 MG PO TABS
10.0000 mg | ORAL_TABLET | Freq: Every day | ORAL | Status: DC
  Administered 2011-11-21 – 2011-11-22 (×2): 10 mg via ORAL
  Filled 2011-11-21 (×3): qty 1

## 2011-11-21 MED ORDER — INSULIN GLARGINE 100 UNIT/ML ~~LOC~~ SOLN
10.0000 [IU] | Freq: Every day | SUBCUTANEOUS | Status: DC
  Administered 2011-11-21 – 2011-11-22 (×2): 10 [IU] via SUBCUTANEOUS

## 2011-11-21 MED ORDER — SODIUM BICARBONATE 8.4 % IV SOLN
INTRAVENOUS | Status: AC
  Administered 2011-11-22: 06:00:00 via INTRAVENOUS
  Filled 2011-11-21: qty 1000

## 2011-11-21 MED ORDER — ASPIRIN 81 MG PO CHEW
324.0000 mg | CHEWABLE_TABLET | ORAL | Status: AC
  Administered 2011-11-22: 324 mg via ORAL
  Filled 2011-11-21: qty 4

## 2011-11-21 MED ORDER — SODIUM CHLORIDE 0.9 % IV SOLN: 250.0000 mL | INTRAVENOUS | Status: DC | PRN

## 2011-11-21 MED ORDER — SODIUM CHLORIDE 0.9 % IJ SOLN: 3.0000 mL | INTRAMUSCULAR | Status: DC | PRN

## 2011-11-21 MED ORDER — ROSUVASTATIN CALCIUM 10 MG PO TABS: 10.0000 mg | ORAL_TABLET | Freq: Every day | ORAL | Status: DC

## 2011-11-21 MED ORDER — ONDANSETRON HCL 4 MG/2ML IJ SOLN: 4.0000 mg | Freq: Four times a day (QID) | INTRAMUSCULAR | Status: DC | PRN

## 2011-11-21 MED ORDER — INSULIN ASPART 100 UNIT/ML ~~LOC~~ SOLN
0.0000 [IU] | Freq: Three times a day (TID) | SUBCUTANEOUS | Status: DC
  Administered 2011-11-21 – 2011-11-22 (×3): 3 [IU] via SUBCUTANEOUS
  Administered 2011-11-22: 5 [IU] via SUBCUTANEOUS
  Administered 2011-11-23: 2 [IU] via SUBCUTANEOUS

## 2011-11-21 MED ORDER — ATORVASTATIN CALCIUM 20 MG PO TABS
20.0000 mg | ORAL_TABLET | Freq: Every day | ORAL | Status: DC
  Administered 2011-11-21 – 2011-11-22 (×2): 20 mg via ORAL
  Filled 2011-11-21 (×3): qty 1

## 2011-11-21 MED ORDER — SODIUM CHLORIDE 0.9 % IJ SOLN
3.0000 mL | Freq: Two times a day (BID) | INTRAMUSCULAR | Status: DC
  Administered 2011-11-21: 3 mL via INTRAVENOUS

## 2011-11-21 MED ORDER — NITROGLYCERIN 2 % TD OINT
0.5000 [in_us] | TOPICAL_OINTMENT | Freq: Four times a day (QID) | TRANSDERMAL | Status: DC
  Administered 2011-11-21 – 2011-11-22 (×4): 0.5 [in_us] via TOPICAL
  Filled 2011-11-21: qty 30

## 2011-11-21 MED ORDER — NITROGLYCERIN 0.4 MG SL SUBL: 0.4000 mg | SUBLINGUAL_TABLET | SUBLINGUAL | Status: DC | PRN

## 2011-11-21 NOTE — H&P (Signed)
  Handwritten H. and P. in the chart

## 2011-11-22 ENCOUNTER — Encounter (HOSPITAL_COMMUNITY)
Admission: AD | Disposition: A | Discharge status: Home / Self Care | Payer: Self-pay | Source: Ambulatory Visit | Attending: Cardiology

## 2011-11-22 DIAGNOSIS — E119 Type 2 diabetes mellitus without complications: Secondary | ICD-10-CM | POA: Diagnosis not present

## 2011-11-22 DIAGNOSIS — I2510 Atherosclerotic heart disease of native coronary artery without angina pectoris: Secondary | ICD-10-CM | POA: Diagnosis not present

## 2011-11-22 DIAGNOSIS — E7800 Pure hypercholesterolemia, unspecified: Secondary | ICD-10-CM | POA: Diagnosis not present

## 2011-11-22 DIAGNOSIS — I200 Unstable angina: Secondary | ICD-10-CM | POA: Diagnosis not present

## 2011-11-22 DIAGNOSIS — I10 Essential (primary) hypertension: Secondary | ICD-10-CM | POA: Diagnosis not present

## 2011-11-22 DIAGNOSIS — I209 Angina pectoris, unspecified: Secondary | ICD-10-CM | POA: Diagnosis not present

## 2011-11-22 DIAGNOSIS — I252 Old myocardial infarction: Secondary | ICD-10-CM | POA: Diagnosis not present

## 2011-11-22 HISTORY — PX: LEFT HEART CATHETERIZATION WITH CORONARY ANGIOGRAM: SHX5451

## 2011-11-22 LAB — BASIC METABOLIC PANEL
Chloride: 108 mEq/L (ref 96–112)
Creatinine, Ser: 1.49 mg/dL — ABNORMAL HIGH (ref 0.50–1.10)
GFR calc Af Amer: 37 mL/min — ABNORMAL LOW (ref >90)
Potassium: 4.1 mEq/L (ref 3.5–5.1)
Sodium: 138 mEq/L (ref 135–145)

## 2011-11-22 LAB — CARDIAC PANEL(CRET KIN+CKTOT+MB+TROPI)
CK, MB: 2.1 ng/mL (ref 0.3–4.0)
Total CK: 72 U/L (ref 7–177)

## 2011-11-22 LAB — CBC
MCV: 86.7 fL (ref 78.0–100.0)
Platelets: 229 10*3/uL (ref 150–400)
RDW: 13 % (ref 11.5–15.5)
WBC: 7 10*3/uL (ref 4.0–10.5)

## 2011-11-22 LAB — LIPID PANEL
Cholesterol: 112 mg/dL (ref 0–200)
VLDL: 21 mg/dL (ref 0–40)

## 2011-11-22 LAB — GLUCOSE, CAPILLARY
Glucose-Capillary: 212 mg/dL — ABNORMAL HIGH (ref 70–99)
Glucose-Capillary: 260 mg/dL — ABNORMAL HIGH (ref 70–99)
Glucose-Capillary: 182 mg/dL — ABNORMAL HIGH (ref 70–99)

## 2011-11-22 SURGERY — LEFT HEART CATHETERIZATION WITH CORONARY ANGIOGRAM
Anesthesia: LOCAL

## 2011-11-22 MED ORDER — ASPIRIN 81 MG PO CHEW: 81.0000 mg | CHEWABLE_TABLET | Freq: Every day | ORAL | Status: DC

## 2011-11-22 MED ORDER — LIDOCAINE HCL (PF) 1 % IJ SOLN
INTRAMUSCULAR | Status: AC
  Filled 2011-11-22: qty 30

## 2011-11-22 MED ORDER — AMLODIPINE BESYLATE 5 MG PO TABS
5.0000 mg | ORAL_TABLET | Freq: Every day | ORAL | Status: DC
  Administered 2011-11-22: 5 mg via ORAL
  Filled 2011-11-22 (×2): qty 1

## 2011-11-22 MED ORDER — NITROGLYCERIN 0.2 MG/ML ON CALL CATH LAB
INTRAVENOUS | Status: AC
  Filled 2011-11-22: qty 1

## 2011-11-22 MED ORDER — HEPARIN (PORCINE) IN NACL 2-0.9 UNIT/ML-% IJ SOLN
INTRAMUSCULAR | Status: AC
  Filled 2011-11-22: qty 2000

## 2011-11-22 MED ORDER — SODIUM CHLORIDE 0.45 % IV SOLN: INTRAVENOUS | Status: AC

## 2011-11-22 MED ORDER — FENTANYL CITRATE 0.05 MG/ML IJ SOLN
INTRAMUSCULAR | Status: AC
  Filled 2011-11-22: qty 2

## 2011-11-22 MED ORDER — AMLODIPINE BESYLATE 5 MG PO TABS
5.0000 mg | ORAL_TABLET | Freq: Once | ORAL | Status: AC
  Administered 2011-11-22: 5 mg via ORAL
  Filled 2011-11-22: qty 1

## 2011-11-22 MED ORDER — ACETAMINOPHEN 325 MG PO TABS: 650.0000 mg | ORAL_TABLET | ORAL | Status: DC | PRN

## 2011-11-22 MED ORDER — ONDANSETRON HCL 4 MG/2ML IJ SOLN: 4.0000 mg | Freq: Four times a day (QID) | INTRAMUSCULAR | Status: DC | PRN

## 2011-11-22 MED ORDER — SODIUM CHLORIDE 0.9 % IV SOLN: INTRAVENOUS | Status: AC

## 2011-11-22 MED ORDER — ISOSORBIDE MONONITRATE ER 60 MG PO TB24
60.0000 mg | ORAL_TABLET | Freq: Every day | ORAL | Status: DC
  Administered 2011-11-22: 60 mg via ORAL
  Filled 2011-11-22 (×2): qty 1

## 2011-11-22 NOTE — CV Procedure (Signed)
Cardiac cath report dictated on 11/22/2011 dictation [phone]

## 2011-11-22 NOTE — Cardiovascular Report (Signed)
[name], THIELKE                 ACCOUNT NO.:  [id number]  MEDICAL RECORD NO.:  [id number]  LOCATION:  [facility]                         FACILITY:  [facility]  PHYSICIAN:  Blaine Hari N. Sharyn Lull, M.D. DATE OF BIRTH:  1928-10-05  DATE OF PROCEDURE:  11/22/2011 DATE OF DISCHARGE:                           CARDIAC CATHETERIZATION   PROCEDURE:  Left cardiac catheterization with selective left and right coronary angiography, measurement of left ventricular end-diastolic pressure via right groin using Judkins technique.  INDICATION FOR PROCEDURE:  [name] is an 78 year old female with past medical history significant for coronary artery disease, history of MI x2 in the past status post PCI to RCA, left circumflex, LAD, and diagonal 2 in the past, hypertension, diabetes mellitus, chronic kidney disease stage IV, hypercholesteremia, history of tobacco abuse in the past, history of syncope, anemia of chronic disease, history of herpes zoster in the past, complains of retrosternal chest pain associated with shortness of breath with minimal exertion associated with feeling weak and tired.  The patient states chest pain feels similar in nature when she had MI and PCI in the past.  Denies any nausea, vomiting, or diaphoresis.  Denies PND, orthopnea, or leg swelling.  Denies palpitation, lightheadedness, or recent syncopal episode.  Denies relation of chest pain to food, breathing, or movement.  Due to typical anginal chest pain and multiple risk factors, I discussed with the patient regarding left cath, possible PTCA and stenting, its risks and benefits, i.e., death, MI, stroke, need for emergency CABG, local vascular complications, also staging the procedure, etc., and consented for catheterization.  DESCRIPTION OF PROCEDURE:  After obtaining the informed consent, the patient was brought to the Cath Lab and was placed on fluoroscopy table. Right groin was prepped and draped in the usual fashion.   Xylocaine 1% was used for local anesthesia in the right groin.  With the help of thin wall needle, a 5-French arterial sheath was placed.  The sheath was aspirated and flushed.  Next, 5-French left Judkins catheter was advanced over the wire under fluoroscopic guidance up to the ascending aorta.  Wire was pulled out.  The catheter was aspirated and connected to the manifold.  Catheter was further advanced and engaged into left coronary ostium.  Multiple views of the left system were taken.  Next, catheter was disengaged and was pulled out over the wire and was replaced with 5-French right Judkins catheter which was advanced over the wire under fluoroscopic guidance up to the ascending aorta.  Wire was pulled out.  The catheter was aspirated and connected to the manifold.  Catheter was further advanced and engaged into right coronary ostium.  Multiple views of the right system were taken.  Next, catheter was disengaged and was pulled out over the lateral and was replaced with 5-French pigtail catheter which was advanced over the wire under fluoroscopic guidance up to the ascending aorta.  Wire was pulled out. The catheter was aspirated and connected to the manifold.  Catheter was further advanced across the aortic valve into the LV.  LV pressures were recorded.  Next, the pullback pressures were recorded from the aorta. There was no gradient across the aortic valve.  Next,  the pigtail catheter was pulled out over the wire.  Sheaths were aspirated and flushed.  FINDINGS:  Left main had 60% distal stenosis.  LAD has 10-15% proximal and 60-65% mid stenosis.  Distal stent in LAD is patent.  Diagonal #1 is very small.  Diagonal #2 has 70% ostial stenosis.  Stented portion in the proximal and mid portion is patent.  Ramus is very small which is diffusely diseased.  Left circumflex has 30% proximal and 30-40% distal diffuse stenosis at prior PTCA site.  OM-1 is very, very small.  OM-2 has  75-80% mid stenosis at prior PTCA site.  Vessel is very small.  RCA has 20-30% mid and 30-40% distal stenosis.  PDA is small which has 70% proximal stenosis and 30-40% distal diffuse stenosis.  PLV branch is also very small which has 95% ostial stenosis at the bifurcation with PDA.  The patient tolerated the procedure well.  There are no complications.  PLAN:  To maximize the anginal medications and treat medically.     Eduardo Osier. Sharyn Lull, M.D.     [facility]/MEDQ  D:  11/22/2011  T:  11/22/2011  Job:  [id number]

## 2011-11-22 NOTE — Progress Notes (Signed)
Patient's BP 190/80 and HR 52. Patient is resting comfortably and is asymptomatic. MD on call paged. New orders received from Dr. Algie Coffer. Will give medication and continue to monitor patient.  Harless Litten, RN 11/22/11

## 2011-11-22 NOTE — H&P (Signed)
  No change in history and physical as before

## 2011-11-22 NOTE — Progress Notes (Signed)
Clinical Social Work Department BRIEF PSYCHOSOCIAL ASSESSMENT 11/22/2011  Patient:  [name], [name]     Account Number:  [id number]     Admit date:  11/21/2011  Clinical Social Worker:  Hulan Fray  Date/Time:  11/22/2011 11:22 AM  Referred by:  RN  Date Referred:  11/21/2011 Referred for  Advanced Directives   Other Referral:   Interview type:  Other - See comment Other interview type:   Patient and son, [name]    PSYCHOSOCIAL DATA Living Status:  FAMILY Admitted from facility:   Level of care:   Primary support name:  [name] and Diane Primary support relationship to patient:  CHILD, ADULT Degree of support available:   Supportive    CURRENT CONCERNS Current Concerns  None Noted   Other Concerns:    SOCIAL WORK ASSESSMENT / PLAN Clinical Social Worker received referral for advance directive request. Patient stated that she felt "loopy." Patient's son, [name] was at bedside. CSW explained to son the advance directive packet and MOST form and that we can notarize the forms and explained other places outside the hospital that can notarize it as well. CSW answered son's questions to best of ability. CSW will sign off as social work intervention is no longer needed.   Assessment/plan status:  No Further Intervention Required Other assessment/ plan:   Information/referral to community resources:   1) Advance directive packet  2) MOST    PATIENT'S/FAMILY'S RESPONSE TO PLAN OF CARE: Shari Heritage, [name] was appreciative of the advance directive packet and MOST form. Patient stated that she will look at the documents later.

## 2011-11-23 DIAGNOSIS — I10 Essential (primary) hypertension: Secondary | ICD-10-CM | POA: Diagnosis not present

## 2011-11-23 DIAGNOSIS — I2510 Atherosclerotic heart disease of native coronary artery without angina pectoris: Secondary | ICD-10-CM | POA: Diagnosis not present

## 2011-11-23 DIAGNOSIS — I252 Old myocardial infarction: Secondary | ICD-10-CM | POA: Diagnosis not present

## 2011-11-23 DIAGNOSIS — E119 Type 2 diabetes mellitus without complications: Secondary | ICD-10-CM | POA: Diagnosis not present

## 2011-11-23 DIAGNOSIS — E7800 Pure hypercholesterolemia, unspecified: Secondary | ICD-10-CM | POA: Diagnosis not present

## 2011-11-23 DIAGNOSIS — I200 Unstable angina: Secondary | ICD-10-CM | POA: Diagnosis not present

## 2011-11-23 LAB — BASIC METABOLIC PANEL
BUN: 23 mg/dL (ref 6–23)
CO2: 23 mEq/L (ref 19–32)
Calcium: 8.5 mg/dL (ref 8.4–10.5)
Creatinine, Ser: 1.56 mg/dL — ABNORMAL HIGH (ref 0.50–1.10)
Glucose, Bld: 149 mg/dL — ABNORMAL HIGH (ref 70–99)

## 2011-11-23 LAB — CBC
HCT: 26.6 % — ABNORMAL LOW (ref 36.0–46.0)
MCH: 29.7 pg (ref 26.0–34.0)
MCHC: 33.8 g/dL (ref 30.0–36.0)
MCV: 87.8 fL (ref 78.0–100.0)
RDW: 13.2 % (ref 11.5–15.5)

## 2011-11-23 MED ORDER — NITROGLYCERIN 0.4 MG SL SUBL: 0.4000 mg | SUBLINGUAL_TABLET | SUBLINGUAL | Status: DC | PRN

## 2011-11-23 MED ORDER — ISOSORBIDE MONONITRATE ER 60 MG PO TB24: 60.0000 mg | ORAL_TABLET | Freq: Every day | ORAL | Status: DC

## 2011-11-23 MED ORDER — ASPIRIN 81 MG PO TABS: 81.0000 mg | ORAL_TABLET | Freq: Every day | ORAL | Status: AC

## 2011-11-23 NOTE — Discharge Instructions (Signed)
Coronary Angiography  Coronary angiography is an X-ray procedure used to look at the arteries in the heart. In this procedure, a dye is injected through a long, hollow tube (catheter). The catheter is about the size of a piece of cooked spaghetti. The catheter injects a dye into an artery in your groin. X-rays are then taken to show if there is a blockage in the arteries of your heart.  BEFORE THE PROCEDURE     Let your caregiver know if you have allergies to shellfish or contrast dye. Also let your caregiver know if you have kidney problems or failure.   Do not eat or drink starting from midnight up to the time of the procedure, or as directed.   You may drink enough water to take your medications the morning of the procedure if you were instructed to do so.   You should be at the hospital or outpatient facility where the procedure is to be done 60 minutes prior to the procedure or as directed.  PROCEDURE   You may be given an IV medication to help you relax before the procedure.   You will be prepared for the procedure by washing and shaving the area where the catheter will be inserted. This is usually done in the groin but may be done in the fold of your arm by your elbow.   A medicine will be given to numb your groin where the catheter will be inserted.   A specially trained doctor will insert the catheter into an artery in your groin. The catheter is guided by using a special type of X-ray (fluoroscopy) to the blood vessel being examined.   A special dye is then injected into the catheter and X-rays are taken. The dye helps to show where any narrowing or blockages are located in the heart arteries.  AFTER THE PROCEDURE     After the procedure you will be kept in bed lying flat for several hours. You will be instructed to not bend or cross your legs.   The groin insertion site will be watched and checked frequently.   The pulse in your feet will be checked frequently.   Additional blood tests,  X-rays and an EKG may be done.   You may stay in the hospital overnight for observation.  SEEK IMMEDIATE MEDICAL CARE IF:     You develop chest pain, shortness of breath, feel faint, or pass out.   There is bleeding, swelling, or drainage from the catheter insertion site.   You develop pain, discoloration, coldness, or severe bruising in the leg or area where the catheter was inserted.   You have a fever.  Document Released: 01/15/2003 Document Revised: 06/30/2011 Document Reviewed: 03/05/2008  [employer] Patient Information 2012 [employer].

## 2011-11-23 NOTE — Discharge Summary (Signed)
  Discharge summary dictated on 11/23/2011 dictation [phone]

## 2011-11-23 NOTE — Discharge Summary (Signed)
[name], [name]                 ACCOUNT NO.:  [id number]  MEDICAL RECORD NO.:  [id number]  LOCATION:  3715                         FACILITY:  [facility]  PHYSICIAN:  Eduardo Osier. Sharyn Lull, M.D. DATE OF BIRTH:  12-08-1928  DATE OF ADMISSION:  11/21/2011 DATE OF DISCHARGE:  11/23/2011                              DISCHARGE SUMMARY   ADMITTING DIAGNOSES: 1. Accelerated angina, rule out progression of coronary artery     disease. 2. Coronary artery disease, history of myocardial infarction x2 in the     past, status post multivessel percutaneous coronary intervention in     the past. 3. Hypertension. 4. Insulin-requiring diabetes mellitus. 5. Hypercholesteremia. 6. Chronic kidney disease stage IV. 7. Anemia of chronic disease. 8. Remote history of tobacco abuse. 9. History of syncope in the past. 10.Marked sinus bradycardia.  DISCHARGE DIAGNOSES: 1. Stable angina, status post cardiac cath. 2. Coronary artery disease, history of myocardial infarction x2 in the     past, status post multivessel percutaneous coronary intervention in     the past. 3. Hypertension. 4. Insulin-requiring diabetes mellitus. 5. Hypercholesteremia. 6. Chronic kidney disease stage IV. 7. Anemia of chronic disease. 8. Remote history of tobacco abuse. 9. History of syncope in the past. 10.Sinus bradycardia, stable.  DISCHARGE HOME MEDICATIONS: 1. Enteric-coated aspirin 81 mg 1 tablet daily. 2. Plavix 75 mg 1 tablet daily. 3. Norvasc 10 mg 1 tablet daily. 4. Imdur 60 mg 1 tablet daily. 5. Nitrostat 0.4 mg sublingual use as directed. 6. Atorvastatin 20 mg 1 tablet daily. 7. Aricept 5 mg 1 tablet daily. 8. Pepcid 1 tablet daily at night. 9. Insulin NPH 25 units daily before breakfast as before. 10.Paxil 10 mg 1 tablet p.o. daily. The patient has been advised to stopped Advil PM, Bystolic, hydralazine, and hydrochlorothiazide.  DIET:  Low-salt, low-cholesterol, 1800 calories ADA diet.  ACTIVITY:  Increase  activity slowly as tolerated.  Post cardiac cath instructions have been given.  FOLLOWUP:  With me in 1 week.  CONDITION AT DISCHARGE:  Stable.  BRIEF HISTORY:  [name] is an 78 year old black female with past medical history significant for coronary artery disease, history of MI x2 in the past, status post PTCA to RCA, left circumflex, obtuse marginal, LAD, and diagonal 2 in the past, hypertension, diabetes mellitus, chronic kidney disease stage IV, hypercholesteremia, history of tobacco abuse in the past, history of syncope, anemia of chronic disease, history of herpes zoster, complains of retrosternal chest pain associated with shortness of breath with minimal exertion, associated with feeling weak and tired.  States chest pain feels similar in nature when she had MI and PCI in the past.  Denies any nausea, vomiting, diaphoresis.  Denies PND, orthopnea, or leg swelling.  Denies palpitation, lightheadedness, or syncope.  The patient denies relation of chest pain to food, breathing, or movement.  PAST MEDICAL HISTORY:  As above.  PAST SURGICAL HISTORY:  She had right breast lumpectomy for ductal CA in the past, had bilateral cataract surgery in the past.  SOCIAL HISTORY:  She is retired, widowed, smoked 1-1/2-pack per day for 40+ years.  Quit approximately 20+ years ago.  No history of alcohol abuse.  FAMILY  HISTORY:  Positive for coronary artery disease.  MEDICATIONS AT HOME: 1. Aspirin. 2. Plavix. 3. Lisinopril. 4. Bystolic. 5. Norvasc. 6. Paxil. 7. Lipitor. 8. Insulin. 9. Hydralazine.  PHYSICAL EXAMINATION:  GENERAL:  She was alert, awake, oriented x3, in no acute distress. VITAL SIGNS:  Blood pressure was 130/80, pulse was 58. HEENT:  Conjunctivae were pink. NECK:  Supple.  No JVD.  No bruit. LUNGS:  Clear to auscultation without rhonchi or rales. CARDIOVASCULAR:  S1 and S2 were normal.  There was soft systolic murmur. No S3 gallop. ABDOMEN:  Soft.  Bowel  sounds are present.  Nontender. EXTREMITIES:  There is no clubbing, cyanosis, or edema.  LABORATORY DATA:  Her 3 sets of cardiac enzymes were negative.  Sodium 137, potassium 4.8, BUN 32, creatinine 1.81.  Repeat creatinine yesterday was 1.49, today postprocedure creatinine is 1.56 which has been stable.  Hemoglobin was 10, hematocrit 29.6, white count of 5.4. Repeat hemoglobin yesterday was 9.2, hematocrit 27.4, white count of 7.0.  Today, hemoglobin is 9, hematocrit 26.6.  Her blood sugar runs between 150-200.  EKG showed sinus bradycardia with poor R-wave progression in anteroseptal leads.  BRIEF HOSPITAL COURSE:  The patient was admitted to telemetry unit.  MI was ruled out by serial enzymes and EKG.  The patient was discussed at length regarding noninvasive stress testing versus left cath, possible PTCA/stenting, its risks and benefits, and consented for PCI.  The patient was hydrated overnight and underwent left cardiac cath yesterday as per procedure report.  The patient tolerated the procedure well.  The patient did not have any anginal chest pain during the hospital stay. The patient was noted to have small vessel disease, and was decided to be treated medically at present with increasing antianginal medications. Her beta-blockers were held due to marked sinus bradycardia.  The patient has been ambulating in the hallway without any problems.  Her groin is stable with no evidence of hematoma or bruit.  The patient will be discharged home on the above medications, and will be followed up in my office in 1 week.     Eduardo Osier. Sharyn Lull, M.D.     [facility]  D:  11/23/2011  T:  11/23/2011  Job:  [id number]

## 2011-11-30 DIAGNOSIS — E7800 Pure hypercholesterolemia, unspecified: Secondary | ICD-10-CM | POA: Diagnosis not present

## 2011-11-30 DIAGNOSIS — I10 Essential (primary) hypertension: Secondary | ICD-10-CM | POA: Diagnosis not present

## 2011-11-30 DIAGNOSIS — E119 Type 2 diabetes mellitus without complications: Secondary | ICD-10-CM | POA: Diagnosis not present

## 2011-11-30 DIAGNOSIS — I209 Angina pectoris, unspecified: Secondary | ICD-10-CM | POA: Diagnosis not present

## 2011-11-30 DIAGNOSIS — I2510 Atherosclerotic heart disease of native coronary artery without angina pectoris: Secondary | ICD-10-CM | POA: Diagnosis not present

## 2011-12-01 ENCOUNTER — Other Ambulatory Visit: Payer: Self-pay | Admitting: Internal Medicine

## 2011-12-05 ENCOUNTER — Ambulatory Visit (INDEPENDENT_AMBULATORY_CARE_PROVIDER_SITE_OTHER): Payer: Medicare Other | Admitting: Internal Medicine

## 2011-12-05 ENCOUNTER — Encounter: Payer: Self-pay | Admitting: Internal Medicine

## 2011-12-05 VITALS — BP 152/72 | HR 78 | Temp 97.2°F | Ht 62.0 in | Wt 172.0 lb

## 2011-12-05 DIAGNOSIS — Z853 Personal history of malignant neoplasm of breast: Secondary | ICD-10-CM

## 2011-12-05 DIAGNOSIS — N644 Mastodynia: Secondary | ICD-10-CM | POA: Diagnosis not present

## 2011-12-05 NOTE — Assessment & Plan Note (Signed)
R breast - 2000 Given new pain, refer for follow up mammo now

## 2011-12-05 NOTE — Patient Instructions (Signed)
It was good to see you today. we'll make referral to [facility] for your mamogram. Our office will contact you regarding appointment(s) once made. Use antibiotic ointment to the "split" as needed, no other treatment changes recommended  Take your insulin as we reviewed and call if sugar over 200!

## 2011-12-05 NOTE — Assessment & Plan Note (Signed)
Uncontrolled - complicated by noncompliance and confusion Oral meds "stopped" 10/2010 and resumed Lantus after UC eval 10/20/10 - increased dose rx'd 10/2010 Then chaged Lantus to [facility] 06/2011 and stopped metformin due to ARI Pt now lives with her dtr (diane) who helps pt with her meds -  Reviewed importance of med and diet compliance  pt/family to call if sugar over 200 - Lab Results  Component Value Date   HGBA1C 11.7* 11/21/2011

## 2011-12-05 NOTE — Progress Notes (Signed)
  Subjective:    Patient ID: [name], female    DOB: [birth year], 78 y.o.   MRN: [id number]  HPI  complains of right breast pain Located 10 o'clock position of nipple at scar site from prior lumpectomy Onset pain 1 week ago - gradually worse pain, acutely worse yesterday Noted "split" and itch at scar site New swelling in past 3 days at scar No fever, no drainage  Past Medical History  Diagnosis Date  . 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 ANEMIA   . DEPRESSION   . HYPERTENSION   . PVD   . INSOMNIA, CHRONIC   . Dementia   . DIABETES MELLITUS, TYPE II   . Hypercholesteremia   . Chronic kidney disease (CKD), stage IV (severe)   . Anemia of chronic disease   . Angina   . Non-Q wave myocardial infarction 10/2009    /E-chart  . Inferior MI 2000    "quiet"  . Breast cancer 2000    right  . Arthritis    Review of Systems  Constitutional: Negative for fever and fatigue.  Neurological: Negative for dizziness.  Psychiatric/Behavioral: Negative for confusion and decreased concentration.       Objective:   Physical Exam BP 152/72  Pulse 78  Temp(Src) 97.2 F (36.2 C) (Oral)  Ht 5\' 2"  (1.575 m)  Wt 172 lb (78.019 kg)  BMI 31.46 kg/m2  SpO2 96% Gen: NAD, dtr at side Lung: CTA B CV: RRR R breast/Skin - scar from above nipple to axillae - small "split" (3mm) without evidence of abscess or infection, thickening related to scar, mildly tender, no mass appreciated (supervised by dtr in room)   Lab Results  Component Value Date   WBC 5.9 11/23/2011   HGB 9.0* 11/23/2011   HCT 26.6* 11/23/2011   PLT 215 11/23/2011   GLUCOSE 149* 11/23/2011   CHOL 112 11/22/2011   TRIG 103 11/22/2011   HDL 30* 11/22/2011   LDLCALC 61 11/22/2011   ALT 11 11/21/2011   AST 15 11/21/2011   NA 138 11/23/2011   K 3.8 11/23/2011   CL 105 11/23/2011   CREATININE 1.56* 11/23/2011   BUN 23 11/23/2011   CO2 23 11/23/2011   TSH 2.454 11/21/2011   INR 1.06 11/21/2011   HGBA1C 11.7* 11/21/2011      Assessment  & Plan:  See problem list. Medications and labs reviewed today.  R breast pain - hx lumpectomy 2000 due to breast cancer - no evidence mastitis or mass - refer for mammo dx now

## 2011-12-14 ENCOUNTER — Ambulatory Visit
Admission: RE | Admit: 2011-12-14 | Discharge: 2011-12-14 | Disposition: A | Discharge status: Home / Self Care | Payer: Medicare Other | Source: Ambulatory Visit | Attending: Internal Medicine | Admitting: Internal Medicine

## 2011-12-14 ENCOUNTER — Other Ambulatory Visit: Payer: Self-pay | Admitting: Internal Medicine

## 2011-12-14 DIAGNOSIS — N644 Mastodynia: Secondary | ICD-10-CM

## 2011-12-14 DIAGNOSIS — Z853 Personal history of malignant neoplasm of breast: Secondary | ICD-10-CM

## 2011-12-30 ENCOUNTER — Other Ambulatory Visit: Payer: Self-pay | Admitting: Internal Medicine

## 2012-01-17 ENCOUNTER — Ambulatory Visit: Payer: Medicare Other | Admitting: Internal Medicine

## 2012-01-23 ENCOUNTER — Other Ambulatory Visit: Payer: Self-pay | Admitting: Internal Medicine

## 2012-01-30 ENCOUNTER — Other Ambulatory Visit: Payer: Self-pay | Admitting: Internal Medicine

## 2012-02-16 DIAGNOSIS — M48061 Spinal stenosis, lumbar region without neurogenic claudication: Secondary | ICD-10-CM | POA: Diagnosis not present

## 2012-02-16 DIAGNOSIS — M47817 Spondylosis without myelopathy or radiculopathy, lumbosacral region: Secondary | ICD-10-CM | POA: Diagnosis not present

## 2012-02-17 ENCOUNTER — Telehealth: Payer: Self-pay

## 2012-02-17 ENCOUNTER — Telehealth: Payer: Self-pay | Admitting: Internal Medicine

## 2012-02-17 NOTE — Telephone Encounter (Signed)
Paperwork with MD recommendations faxed back to office.

## 2012-02-17 NOTE — Telephone Encounter (Signed)
Jae Dire from [employer] called stating pt is scheduled for an injection and they recommend pt HOLD Plavix starting today. Ortho office is requesting advisement from VAL regarding this, please advise.

## 2012-02-17 NOTE — Telephone Encounter (Signed)
Forward 1 page to Dr. Rene Paci for review on 02-17-12 ym

## 2012-02-17 NOTE — Telephone Encounter (Signed)
Ok to stop plavix 7 days prior to injection - resume ASAP after injection due to hx CAD

## 2012-02-24 DIAGNOSIS — M47817 Spondylosis without myelopathy or radiculopathy, lumbosacral region: Secondary | ICD-10-CM | POA: Diagnosis not present

## 2012-03-07 ENCOUNTER — Ambulatory Visit: Payer: Medicare Other | Admitting: Internal Medicine

## 2012-03-08 ENCOUNTER — Ambulatory Visit: Payer: Medicare Other | Admitting: Internal Medicine

## 2012-03-08 DIAGNOSIS — Z0289 Encounter for other administrative examinations: Secondary | ICD-10-CM

## 2012-03-09 DIAGNOSIS — E119 Type 2 diabetes mellitus without complications: Secondary | ICD-10-CM | POA: Diagnosis not present

## 2012-03-09 DIAGNOSIS — I10 Essential (primary) hypertension: Secondary | ICD-10-CM | POA: Diagnosis not present

## 2012-03-09 DIAGNOSIS — I2510 Atherosclerotic heart disease of native coronary artery without angina pectoris: Secondary | ICD-10-CM | POA: Diagnosis not present

## 2012-03-09 DIAGNOSIS — E7800 Pure hypercholesterolemia, unspecified: Secondary | ICD-10-CM | POA: Diagnosis not present

## 2012-03-09 DIAGNOSIS — I209 Angina pectoris, unspecified: Secondary | ICD-10-CM | POA: Diagnosis not present

## 2012-04-01 ENCOUNTER — Inpatient Hospital Stay (HOSPITAL_COMMUNITY): Payer: Medicare Other

## 2012-04-01 ENCOUNTER — Emergency Department (HOSPITAL_COMMUNITY): Payer: Medicare Other

## 2012-04-01 ENCOUNTER — Inpatient Hospital Stay (HOSPITAL_COMMUNITY)
Admission: EM | Admit: 2012-04-01 | Discharge: 2012-04-04 | DRG: 638 | Disposition: A | Discharge status: Skilled Nursing Facility | Payer: Medicare Other | Attending: Internal Medicine | Admitting: Internal Medicine

## 2012-04-01 ENCOUNTER — Encounter (HOSPITAL_COMMUNITY): Payer: Self-pay | Admitting: *Deleted

## 2012-04-01 DIAGNOSIS — N179 Acute kidney failure, unspecified: Secondary | ICD-10-CM | POA: Diagnosis not present

## 2012-04-01 DIAGNOSIS — IMO0001 Reserved for inherently not codable concepts without codable children: Secondary | ICD-10-CM | POA: Diagnosis not present

## 2012-04-01 DIAGNOSIS — Z853 Personal history of malignant neoplasm of breast: Secondary | ICD-10-CM

## 2012-04-01 DIAGNOSIS — E119 Type 2 diabetes mellitus without complications: Secondary | ICD-10-CM | POA: Diagnosis not present

## 2012-04-01 DIAGNOSIS — Z91199 Patient's noncompliance with other medical treatment and regimen due to unspecified reason: Secondary | ICD-10-CM

## 2012-04-01 DIAGNOSIS — F3289 Other specified depressive episodes: Secondary | ICD-10-CM | POA: Diagnosis present

## 2012-04-01 DIAGNOSIS — Z87891 Personal history of nicotine dependence: Secondary | ICD-10-CM

## 2012-04-01 DIAGNOSIS — E1100 Type 2 diabetes mellitus with hyperosmolarity without nonketotic hyperglycemic-hyperosmolar coma (NKHHC): Secondary | ICD-10-CM | POA: Diagnosis present

## 2012-04-01 DIAGNOSIS — M25519 Pain in unspecified shoulder: Secondary | ICD-10-CM | POA: Diagnosis not present

## 2012-04-01 DIAGNOSIS — R4182 Altered mental status, unspecified: Secondary | ICD-10-CM | POA: Diagnosis not present

## 2012-04-01 DIAGNOSIS — Z951 Presence of aortocoronary bypass graft: Secondary | ICD-10-CM

## 2012-04-01 DIAGNOSIS — D649 Anemia, unspecified: Secondary | ICD-10-CM | POA: Diagnosis present

## 2012-04-01 DIAGNOSIS — Z79899 Other long term (current) drug therapy: Secondary | ICD-10-CM | POA: Diagnosis not present

## 2012-04-01 DIAGNOSIS — N183 Chronic kidney disease, stage 3 unspecified: Secondary | ICD-10-CM | POA: Diagnosis not present

## 2012-04-01 DIAGNOSIS — G40909 Epilepsy, unspecified, not intractable, without status epilepticus: Secondary | ICD-10-CM | POA: Diagnosis not present

## 2012-04-01 DIAGNOSIS — Z66 Do not resuscitate: Secondary | ICD-10-CM | POA: Diagnosis present

## 2012-04-01 DIAGNOSIS — E7800 Pure hypercholesterolemia, unspecified: Secondary | ICD-10-CM | POA: Diagnosis present

## 2012-04-01 DIAGNOSIS — E1129 Type 2 diabetes mellitus with other diabetic kidney complication: Secondary | ICD-10-CM | POA: Diagnosis present

## 2012-04-01 DIAGNOSIS — S199XXA Unspecified injury of neck, initial encounter: Secondary | ICD-10-CM | POA: Diagnosis not present

## 2012-04-01 DIAGNOSIS — N184 Chronic kidney disease, stage 4 (severe): Secondary | ICD-10-CM | POA: Diagnosis present

## 2012-04-01 DIAGNOSIS — E1101 Type 2 diabetes mellitus with hyperosmolarity with coma: Secondary | ICD-10-CM

## 2012-04-01 DIAGNOSIS — R0989 Other specified symptoms and signs involving the circulatory and respiratory systems: Secondary | ICD-10-CM | POA: Diagnosis not present

## 2012-04-01 DIAGNOSIS — I739 Peripheral vascular disease, unspecified: Secondary | ICD-10-CM | POA: Diagnosis present

## 2012-04-01 DIAGNOSIS — R748 Abnormal levels of other serum enzymes: Secondary | ICD-10-CM | POA: Diagnosis present

## 2012-04-01 DIAGNOSIS — M79609 Pain in unspecified limb: Secondary | ICD-10-CM | POA: Diagnosis not present

## 2012-04-01 DIAGNOSIS — E109 Type 1 diabetes mellitus without complications: Secondary | ICD-10-CM | POA: Diagnosis not present

## 2012-04-01 DIAGNOSIS — M25539 Pain in unspecified wrist: Secondary | ICD-10-CM | POA: Diagnosis present

## 2012-04-01 DIAGNOSIS — S92919A Unspecified fracture of unspecified toe(s), initial encounter for closed fracture: Secondary | ICD-10-CM | POA: Diagnosis not present

## 2012-04-01 DIAGNOSIS — I252 Old myocardial infarction: Secondary | ICD-10-CM | POA: Diagnosis not present

## 2012-04-01 DIAGNOSIS — R55 Syncope and collapse: Secondary | ICD-10-CM | POA: Diagnosis present

## 2012-04-01 DIAGNOSIS — Z794 Long term (current) use of insulin: Secondary | ICD-10-CM | POA: Diagnosis not present

## 2012-04-01 DIAGNOSIS — I2510 Atherosclerotic heart disease of native coronary artery without angina pectoris: Secondary | ICD-10-CM | POA: Diagnosis present

## 2012-04-01 DIAGNOSIS — S92912A Unspecified fracture of left toe(s), initial encounter for closed fracture: Secondary | ICD-10-CM

## 2012-04-01 DIAGNOSIS — F068 Other specified mental disorders due to known physiological condition: Secondary | ICD-10-CM | POA: Diagnosis present

## 2012-04-01 DIAGNOSIS — R52 Pain, unspecified: Secondary | ICD-10-CM | POA: Diagnosis not present

## 2012-04-01 DIAGNOSIS — W19XXXA Unspecified fall, initial encounter: Secondary | ICD-10-CM | POA: Diagnosis present

## 2012-04-01 DIAGNOSIS — I129 Hypertensive chronic kidney disease with stage 1 through stage 4 chronic kidney disease, or unspecified chronic kidney disease: Secondary | ICD-10-CM | POA: Diagnosis present

## 2012-04-01 DIAGNOSIS — M19039 Primary osteoarthritis, unspecified wrist: Secondary | ICD-10-CM | POA: Diagnosis not present

## 2012-04-01 DIAGNOSIS — D638 Anemia in other chronic diseases classified elsewhere: Secondary | ICD-10-CM | POA: Diagnosis present

## 2012-04-01 DIAGNOSIS — Z8679 Personal history of other diseases of the circulatory system: Secondary | ICD-10-CM

## 2012-04-01 DIAGNOSIS — IMO0002 Reserved for concepts with insufficient information to code with codable children: Secondary | ICD-10-CM | POA: Diagnosis not present

## 2012-04-01 DIAGNOSIS — F329 Major depressive disorder, single episode, unspecified: Secondary | ICD-10-CM | POA: Diagnosis present

## 2012-04-01 DIAGNOSIS — Z9119 Patient's noncompliance with other medical treatment and regimen: Secondary | ICD-10-CM

## 2012-04-01 DIAGNOSIS — Z5189 Encounter for other specified aftercare: Secondary | ICD-10-CM | POA: Diagnosis not present

## 2012-04-01 DIAGNOSIS — Z043 Encounter for examination and observation following other accident: Secondary | ICD-10-CM | POA: Diagnosis not present

## 2012-04-01 DIAGNOSIS — F0390 Unspecified dementia without behavioral disturbance: Secondary | ICD-10-CM | POA: Diagnosis not present

## 2012-04-01 LAB — GLUCOSE, CAPILLARY
Glucose-Capillary: 468 mg/dL — ABNORMAL HIGH (ref 70–99)
Glucose-Capillary: 249 mg/dL — ABNORMAL HIGH (ref 70–99)
Glucose-Capillary: 205 mg/dL — ABNORMAL HIGH (ref 70–99)
Glucose-Capillary: 191 mg/dL — ABNORMAL HIGH (ref 70–99)
Glucose-Capillary: 202 mg/dL — ABNORMAL HIGH (ref 70–99)
Glucose-Capillary: 173 mg/dL — ABNORMAL HIGH (ref 70–99)
Glucose-Capillary: 147 mg/dL — ABNORMAL HIGH (ref 70–99)
Glucose-Capillary: 129 mg/dL — ABNORMAL HIGH (ref 70–99)
Glucose-Capillary: 131 mg/dL — ABNORMAL HIGH (ref 70–99)

## 2012-04-01 LAB — CBC
HCT: 33.8 % — ABNORMAL LOW (ref 36.0–46.0)
MCHC: 31.7 g/dL (ref 30.0–36.0)
Platelets: 259 10*3/uL (ref 150–400)
RDW: 13.8 % (ref 11.5–15.5)
WBC: 9.4 10*3/uL (ref 4.0–10.5)

## 2012-04-01 LAB — POCT I-STAT, CHEM 8
Chloride: 97 mEq/L (ref 96–112)
HCT: 35 % — ABNORMAL LOW (ref 36.0–46.0)
Hemoglobin: 11.9 g/dL — ABNORMAL LOW (ref 12.0–15.0)
Potassium: 5.3 mEq/L — ABNORMAL HIGH (ref 3.5–5.1)
Sodium: 126 mEq/L — ABNORMAL LOW (ref 135–145)

## 2012-04-01 LAB — COMPREHENSIVE METABOLIC PANEL
AST: 19 U/L (ref 0–37)
Albumin: 3.6 g/dL (ref 3.5–5.2)
BUN: 31 mg/dL — ABNORMAL HIGH (ref 6–23)
Chloride: 87 mEq/L — ABNORMAL LOW (ref 96–112)
Creatinine, Ser: 1.93 mg/dL — ABNORMAL HIGH (ref 0.50–1.10)
Potassium: 5.2 mEq/L — ABNORMAL HIGH (ref 3.5–5.1)
Total Bilirubin: 0.3 mg/dL (ref 0.3–1.2)
Total Protein: 7.7 g/dL (ref 6.0–8.3)

## 2012-04-01 LAB — BLOOD GAS, ARTERIAL
Drawn by: 336861
FIO2: 0.21 %
O2 Saturation: 91.3 %
Patient temperature: 98.6
pO2, Arterial: 63.5 mmHg — ABNORMAL LOW (ref 80.0–100.0)

## 2012-04-01 LAB — CK TOTAL AND CKMB (NOT AT ARMC)
CK, MB: 3.1 ng/mL (ref 0.3–4.0)
Total CK: 95 U/L (ref 7–177)

## 2012-04-01 MED ORDER — PAROXETINE HCL 10 MG PO TABS
10.0000 mg | ORAL_TABLET | Freq: Every day | ORAL | Status: DC
  Administered 2012-04-01 – 2012-04-03 (×3): 10 mg via ORAL
  Filled 2012-04-01 (×4): qty 1

## 2012-04-01 MED ORDER — SODIUM CHLORIDE 0.9 % IV SOLN
INTRAVENOUS | Status: DC
  Filled 2012-04-01: qty 1

## 2012-04-01 MED ORDER — INSULIN REGULAR BOLUS VIA INFUSION
0.0000 [IU] | Freq: Three times a day (TID) | INTRAVENOUS | Status: DC
  Filled 2012-04-01: qty 10

## 2012-04-01 MED ORDER — NITROGLYCERIN 0.4 MG SL SUBL
0.4000 mg | SUBLINGUAL_TABLET | SUBLINGUAL | Status: DC | PRN
Start: 2012-04-01 — End: 2012-04-04

## 2012-04-01 MED ORDER — AMLODIPINE BESYLATE 10 MG PO TABS
10.0000 mg | ORAL_TABLET | Freq: Every day | ORAL | Status: DC
  Administered 2012-04-01 – 2012-04-04 (×4): 10 mg via ORAL
  Filled 2012-04-01 (×4): qty 1

## 2012-04-01 MED ORDER — ONDANSETRON HCL 4 MG PO TABS: 4.0000 mg | ORAL_TABLET | Freq: Four times a day (QID) | ORAL | Status: DC | PRN

## 2012-04-01 MED ORDER — ATORVASTATIN CALCIUM 20 MG PO TABS
20.0000 mg | ORAL_TABLET | Freq: Every day | ORAL | Status: DC
  Administered 2012-04-01 – 2012-04-03 (×3): 20 mg via ORAL
  Filled 2012-04-01 (×4): qty 1

## 2012-04-01 MED ORDER — HYDROCODONE-ACETAMINOPHEN 5-325 MG PO TABS
1.0000 | ORAL_TABLET | ORAL | Status: DC | PRN
  Administered 2012-04-01 – 2012-04-02 (×2): 2 via ORAL
  Administered 2012-04-03 – 2012-04-04 (×2): 1 via ORAL
  Filled 2012-04-01: qty 1
  Filled 2012-04-01: qty 2
  Filled 2012-04-01: qty 1
  Filled 2012-04-01: qty 2

## 2012-04-01 MED ORDER — DEXTROSE 50 % IV SOLN
25.0000 mL | INTRAVENOUS | Status: DC | PRN
  Filled 2012-04-01: qty 50

## 2012-04-01 MED ORDER — ASPIRIN EC 81 MG PO TBEC
81.0000 mg | DELAYED_RELEASE_TABLET | Freq: Every day | ORAL | Status: DC
  Administered 2012-04-01 – 2012-04-04 (×4): 81 mg via ORAL
  Filled 2012-04-01 (×4): qty 1

## 2012-04-01 MED ORDER — DONEPEZIL HCL 5 MG PO TABS
5.0000 mg | ORAL_TABLET | Freq: Every day | ORAL | Status: DC
  Administered 2012-04-01 – 2012-04-03 (×3): 5 mg via ORAL
  Filled 2012-04-01 (×4): qty 1

## 2012-04-01 MED ORDER — ISOSORBIDE MONONITRATE ER 60 MG PO TB24
60.0000 mg | ORAL_TABLET | Freq: Every day | ORAL | Status: DC
  Administered 2012-04-01 – 2012-04-04 (×4): 60 mg via ORAL
  Filled 2012-04-01 (×4): qty 1

## 2012-04-01 MED ORDER — SODIUM CHLORIDE 0.9 % IV SOLN: INTRAVENOUS | Status: DC

## 2012-04-01 MED ORDER — CLOPIDOGREL BISULFATE 75 MG PO TABS
75.0000 mg | ORAL_TABLET | Freq: Every day | ORAL | Status: DC
  Administered 2012-04-02 – 2012-04-04 (×3): 75 mg via ORAL
  Filled 2012-04-01 (×5): qty 1

## 2012-04-01 MED ORDER — FAMOTIDINE 20 MG PO TABS
20.0000 mg | ORAL_TABLET | Freq: Every day | ORAL | Status: DC
  Administered 2012-04-01 – 2012-04-03 (×3): 20 mg via ORAL
  Filled 2012-04-01 (×4): qty 1

## 2012-04-01 MED ORDER — INSULIN REGULAR HUMAN 100 UNIT/ML IJ SOLN
INTRAMUSCULAR | Status: DC
  Administered 2012-04-01: 6.5 [IU]/h via INTRAVENOUS
  Filled 2012-04-01: qty 1

## 2012-04-01 MED ORDER — SODIUM CHLORIDE 0.9 % IJ SOLN
3.0000 mL | Freq: Two times a day (BID) | INTRAMUSCULAR | Status: DC
  Administered 2012-04-01 – 2012-04-04 (×6): 3 mL via INTRAVENOUS

## 2012-04-01 MED ORDER — ACETAMINOPHEN 650 MG RE SUPP: 650.0000 mg | Freq: Four times a day (QID) | RECTAL | Status: DC | PRN

## 2012-04-01 MED ORDER — SODIUM CHLORIDE 0.9 % IV BOLUS (SEPSIS)
1000.0000 mL | Freq: Once | INTRAVENOUS | Status: AC
  Administered 2012-04-01: 1000 mL via INTRAVENOUS

## 2012-04-01 MED ORDER — BIOTENE DRY MOUTH MT LIQD
15.0000 mL | Freq: Two times a day (BID) | OROMUCOSAL | Status: DC
  Administered 2012-04-01 – 2012-04-04 (×7): 15 mL via OROMUCOSAL

## 2012-04-01 MED ORDER — INSULIN ASPART 100 UNIT/ML ~~LOC~~ SOLN
0.0000 [IU] | Freq: Three times a day (TID) | SUBCUTANEOUS | Status: DC
  Administered 2012-04-02: 15 [IU] via SUBCUTANEOUS
  Administered 2012-04-02: 5 [IU] via SUBCUTANEOUS
  Administered 2012-04-02: 8 [IU] via SUBCUTANEOUS
  Administered 2012-04-03 (×2): 5 [IU] via SUBCUTANEOUS
  Administered 2012-04-03: 8 [IU] via SUBCUTANEOUS
  Administered 2012-04-04: 5 [IU] via SUBCUTANEOUS
  Administered 2012-04-04: 3 [IU] via SUBCUTANEOUS

## 2012-04-01 MED ORDER — INSULIN REGULAR BOLUS VIA INFUSION
10.0000 [IU] | Freq: Once | INTRAVENOUS | Status: AC
  Administered 2012-04-01: 10 [IU] via INTRAVENOUS

## 2012-04-01 MED ORDER — ALUM & MAG HYDROXIDE-SIMETH 200-200-20 MG/5ML PO SUSP: 30.0000 mL | Freq: Four times a day (QID) | ORAL | Status: DC | PRN

## 2012-04-01 MED ORDER — POLYETHYLENE GLYCOL 3350 17 G PO PACK
17.0000 g | PACK | Freq: Every day | ORAL | Status: DC | PRN
  Filled 2012-04-01: qty 1

## 2012-04-01 MED ORDER — DEXTROSE-NACL 5-0.45 % IV SOLN
INTRAVENOUS | Status: DC
  Administered 2012-04-01: 14:00:00 via INTRAVENOUS
  Administered 2012-04-02: 100 mL/h via INTRAVENOUS

## 2012-04-01 MED ORDER — SODIUM CHLORIDE 0.9 % IV SOLN
INTRAVENOUS | Status: DC
  Administered 2012-04-01 – 2012-04-03 (×4): via INTRAVENOUS

## 2012-04-01 MED ORDER — INSULIN GLARGINE 100 UNIT/ML ~~LOC~~ SOLN
10.0000 [IU] | Freq: Every day | SUBCUTANEOUS | Status: DC
  Administered 2012-04-01: 10 [IU] via SUBCUTANEOUS

## 2012-04-01 MED ORDER — DEXTROSE 50 % IV SOLN: 25.0000 mL | INTRAVENOUS | Status: DC | PRN

## 2012-04-01 MED ORDER — INSULIN ASPART 100 UNIT/ML ~~LOC~~ SOLN
0.0000 [IU] | Freq: Every day | SUBCUTANEOUS | Status: DC
  Administered 2012-04-02: 2 [IU] via SUBCUTANEOUS
  Administered 2012-04-03: 4 [IU] via SUBCUTANEOUS

## 2012-04-01 MED ORDER — ACETAMINOPHEN 325 MG PO TABS
650.0000 mg | ORAL_TABLET | Freq: Four times a day (QID) | ORAL | Status: DC | PRN
  Administered 2012-04-01: 650 mg via ORAL
  Filled 2012-04-01: qty 2

## 2012-04-01 MED ORDER — SODIUM CHLORIDE 0.9 % IV SOLN
INTRAVENOUS | Status: DC
  Administered 2012-04-01 (×2): via INTRAVENOUS

## 2012-04-01 MED ORDER — SODIUM CHLORIDE 0.9 % IV SOLN
INTRAVENOUS | Status: DC
  Administered 2012-04-01: 5.8 [IU]/h via INTRAVENOUS
  Filled 2012-04-01: qty 1

## 2012-04-01 MED ORDER — ONDANSETRON HCL 4 MG/2ML IJ SOLN: 4.0000 mg | Freq: Four times a day (QID) | INTRAMUSCULAR | Status: DC | PRN

## 2012-04-01 MED ORDER — INSULIN REGULAR BOLUS VIA INFUSION
0.0000 [IU] | Freq: Three times a day (TID) | INTRAVENOUS | Status: DC
  Administered 2012-04-01: 0.5 [IU] via INTRAVENOUS
  Administered 2012-04-01: 2.8 [IU] via INTRAVENOUS
  Filled 2012-04-01: qty 10

## 2012-04-01 NOTE — ED Notes (Signed)
Report given to jennifer, rn on floor.  

## 2012-04-01 NOTE — ED Notes (Signed)
Called back into room patient's heart rate was 38 and patient had stare and then came back around. Heart rate back up to 84. Rn notified. Pt also vomiting red emesis

## 2012-04-01 NOTE — ED Notes (Signed)
ZOX:[id number]<AV> Expected date:04/01/12<BR> Expected time: 3:50 AM<BR> Means of arrival:Ambulance<BR> Comments:<BR> RM 10: AMS, elderly. a

## 2012-04-01 NOTE — H&P (Signed)
History and Physical  [name] ZOX:[id number] DOB: Sep 18, 1928 DOA: 04/01/2012  Referring physician: Sunnie Nielsen, MD PCP: Rene Paci, MD   Chief Complaint: fall  HPI:  78 year old woman with history of DM presented with daughter, son to ED from home. History obtained primarily from daughter at bedside. Patient fell yesterday. Daughter called to check on early this AM around 3. Patient reported she was on commode and unable to get up. When daughter arrived home 1.5 hours later patient was still on commode. Daughter helped patient back to bed, but patient lost balance and fell ("passed out") onto carpet, did not strike head. Patient complained of left great toe pain.  CBG >600 via EMS.  In ED noted to be afebrile, vitals stable. -ABG notable for hypoxemia only -creatine 1.93, sodium 121, potassium 5.3, glucose 1018 -given MIVF (no bolus), insulin infusion -referred for admission  -ED report taken by Dr. Mort Sawyers  Review of Systems:  Negative for fever, visual changes, sore throat, rash, SOB, dysuria, bleeding, n/v/abdominal pain.  Positive for good appetite, intermittent chronic chest pain  Past Medical History  Diagnosis Date  . 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 ANEMIA   . DEPRESSION   . HYPERTENSION   . PVD   . INSOMNIA, CHRONIC   . Dementia   . DIABETES MELLITUS, TYPE II   . Hypercholesteremia   . Chronic kidney disease (CKD), stage IV (severe)   . Anemia of chronic disease   . Angina   . Non-Q wave myocardial infarction 10/2009    /E-chart  . Inferior MI 2000    "quiet"  . Breast cancer 2000    right  . Arthritis     Past Surgical History  Procedure Date  . Capsulectomy 12/27/2010    L eye  . Breast lumpectomy 2000    right  . Cataract extraction w/ intraocular lens  implant, bilateral   . Coronary artery bypass graft 2000    CABG X1  . Coronary angioplasty with stent placement     "twice"  . Eye surgery     Cataracts (R) eye 06/1999 & (L)  eye 09/1999- [place]  . Combined hysteroscopy diagnostic / d&c 07/2004    /e-chart  . Dilation and curettage of uterus     Social History:  reports that she has quit smoking. Her smoking use included Cigarettes. She has a 20 pack-year smoking history. She has never used smokeless tobacco. She reports that she drinks alcohol. She reports that she does not use illicit drugs.  No Known Allergies  Family History  Problem Relation Age of Onset  . Coronary artery disease Father    Prior to Admission medications   Medication Sig Start Date End Date Taking? Authorizing Provider  amLODipine (NORVASC) 10 MG tablet TAKE 1 TABLET BY MOUTH DAILY 11/03/11  Yes Newt Lukes, MD  aspirin 81 MG tablet Take 1 tablet (81 mg total) by mouth daily. 11/23/11  Yes Robynn Pane, MD  atorvastatin (LIPITOR) 20 MG tablet TAKE 1 TABLET BY MOUTH DAILY 11/03/11  Yes Newt Lukes, MD  clopidogrel (PLAVIX) 75 MG tablet TAKE 1 TABLET BY MOUTH DAILY 11/03/11  Yes Newt Lukes, MD  donepezil (ARICEPT) 5 MG tablet TAKE 1 TABLET BY MOUTH DAILY EVERY NIGHT AT BEDTIME 01/30/12  Yes Etta Grandchild, MD  famotidine (PEPCID) 20 MG tablet TAKE 1 TABLET BY MOUTH EVERY NIGHT AT BEDTIME 12/01/11  Yes Newt Lukes, MD  insulin  NPH (HUMULIN N,NOVOLIN N) 100 UNIT/ML injection Inject 25 Units into the skin daily before breakfast. 09/07/11  Yes Newt Lukes, MD  isosorbide mononitrate (IMDUR) 60 MG 24 hr tablet Take 1 tablet (60 mg total) by mouth daily. 11/23/11 11/22/12 Yes Robynn Pane, MD  nitroGLYCERIN (NITROSTAT) 0.4 MG SL tablet Place 1 tablet (0.4 mg total) under the tongue every 5 (five) minutes x 3 doses as needed for chest pain. 11/23/11 11/22/12 Yes Robynn Pane, MD  PARoxetine (PAXIL) 10 MG tablet TAKE 1 TABLET BY MOUTH AT BEDTIME 11/03/11  Yes Newt Lukes, MD   Physical Exam: Filed Vitals:   04/01/12 0402  BP: 152/55  Pulse: 64  Temp: 97.8 F (36.6 C)  TempSrc: Oral  Resp: 16  SpO2: 94%      General:  Examined in ED. Appears calm and comfortable, moderately ill.  Eyes: Pupils, lids, conjunctiva appear normal; left iris with scar nasally/superiorly  ENT: grossly normal hearing, lips, tongue  Neck: no LAD, masses or thyromegaly  Cardiovascular: RRR, no m/r/g. No LE edema  Respiratory: CTA bilaterally, no w/r/r. Normal respiratory effort  Abdomen: soft, ntnd  Skin: no rash or induration, non-tender to palpation  Musculoskeletal: globally weak, but non-focal; grossly normal tone  Psychiatric: grossly normal mood and affect, speech fluent and appropriate  Neurologic: grossly non-focal  Wt Readings from Last 3 Encounters:  12/05/11 78.019 kg (172 lb)  11/21/11 79.516 kg (175 lb 4.8 oz)  11/21/11 79.516 kg (175 lb 4.8 oz)    Labs on Admission:  Basic Metabolic Panel:  Lab 04/01/12 1610 04/01/12 0410  NA 126* 121*  K 5.3* 5.2*  CL 97 87*  CO2 -- 19  GLUCOSE >700* 1018*  BUN 32* 31*  CREATININE 2.10* 1.93*  CALCIUM -- 8.8  MG -- --  PHOS -- --    Liver Function Tests:  Lab 04/01/12 0410  AST 19  ALT 12  ALKPHOS 307*  BILITOT 0.3  PROT 7.7  ALBUMIN 3.6   CBC:  Lab 04/01/12 0442 04/01/12 0410  WBC -- 9.4  NEUTROABS -- --  HGB 11.9* 10.7*  HCT 35.0* 33.8*  MCV -- 89.4  PLT -- 259    Cardiac Enzymes:  Lab 04/01/12 0410  CKTOTAL 95  CKMB 3.1  CKMBINDEX --  TROPONINI <0.30   CBG:  Lab 04/01/12 0701 04/01/12 0618 04/01/12 0405  GLUCAP >600* >600* >600*   Radiological Exams on Admission: Ct Head Wo Contrast  04/01/2012  *RADIOLOGY REPORT*  Clinical Data: The patient found on floor.  Blood on the right eyebrow.  Altered mental status.  CT HEAD WITHOUT CONTRAST  Technique:  Contiguous axial images were obtained from the base of the skull through the vertex without contrast.  Comparison: 07/11/2011  Findings: The cerebral atrophy.  Ventricular dilatation consistent with central atrophy.  Low attenuation changes in the deep white matter  consistent with small vessel ischemia.  No mass effect or midline shift.  No abnormal extra-axial fluid collections.  Wallace Cullens- white matter junctions are distinct.  Basal days.  No evidence of acute intracranial hemorrhage.  No depressed skull fractures. Visualized paranasal sinuses and mastoid air cells are not opacified.  No significant change since previous study.  Vascular calcifications.  IMPRESSION: No acute intracranial abnormalities.  Chronic-appearing atrophy and small vessel ischemic change.   Original Report Authenticated By: Marlon Pel, M.D.    Dg Chest Portable 1 View  04/01/2012  *RADIOLOGY REPORT*  Clinical Data: Altered mental status.  Fall.  PORTABLE CHEST - 1 VIEW  Comparison: 07/10/2011.  Findings: Shallow inspiration.  Mild cardiac enlargement with mild pulmonary vascular congestion.  Interstitial changes could represent fibrosis or edema.  No focal airspace consolidation.  No blunting of costophrenic angles.  No pneumothorax.  Mediastinal contours appear intact.  IMPRESSION: Cardiac enlargement with mild pulmonary vascular congestion and interstitial edema versus fibrosis.   Original Report Authenticated By: Marlon Pel, M.D.    Dg Foot Complete Left  04/01/2012  *RADIOLOGY REPORT*  Clinical Data: Pain after fall.  LEFT FOOT - COMPLETE 3+ VIEW  Comparison: None.  Findings: Diffuse bone demineralization.  Acute nondisplaced fracture of the mid shaft proximal phalanx of the left first toe. Degenerative changes in the first metatarsal phalangeal and multiple interphalangeal joints.  Erosion of the distal aspect of the proximal phalanx of the fifth toe.  Diffusely increased density in the soft tissues may be relative to the bone demineralization or could represent soft tissue hematomas.  IMPRESSION: Acute fracture of the proximal phalanx of the left first toe. Diffuse bone demineralization and degenerative changes.  Erosion of the distal aspect of the proximal phalanx of the fifth  toe.   Original Report Authenticated By: Marlon Pel, M.D.    Dg Foot Complete Right  04/01/2012  *RADIOLOGY REPORT*  Clinical Data: Pain after fall.  RIGHT FOOT COMPLETE - 3+ VIEW  Comparison: None.  Findings: Diffuse bone demineralization.  Degenerative changes in the first metatarsophalangeal and interphalangeal joints.  Erosion of the distal aspect of the proximal phalanx of the right fifth toe.  Degenerative changes in the intertarsal and tarsometatarsal joints.  Vascular calcifications.  No acute fractures or subluxations.  IMPRESSION: Diffuse bone demineralization and degenerative changes.  Erosion of the distal aspect of the proximal phalanx of the fifth toe.  No acute fractures.   Original Report Authenticated By: Marlon Pel, M.D.     EKG: Independently reviewed. SR, no acute changes.   Principal Problem:  *Hyperosmolar (nonketotic) coma Active Problems:  Type II or unspecified type diabetes mellitus without mention of complication, uncontrolled  ANEMIA  CAD  SYNCOPE  Fracture of left toe  Wrist pain, left  Elevated alkaline phosphatase level  CKD (chronic kidney disease), stage III   Assessment/Plan 1. Hyperosmolar non-ketotic state--IVF, IV insulin with transition to SQ when improved. Secondary to admitted non-compliance with insulin. 2. DM type 2, uncontrolled--check Hgb A1c. Plan as above. 3. Minimal hyperkalemia--IVF, recheck. Corrected sodium 135. 4. Fall/syncope--secondary to above. PT eval. 5. Acute fracture left first toe--proximal phalanx, non-displaced. Hard shoe, outpatient orthopedic follow-up. 6. Left wrist pain--check x-rays to exclude fracture. 7. Elevated alkaline phosphatase--chronic to at least 05/2011. Follow-up as outpatient. 8. History of CAD--stable (appears to have chronic stable angina, EKG without acute changes, initial CKMB/troponin negative). Doubt ACS. Repeat troponin x1. Continue clopidogrel, ASA, isosorbide mononitrate, statin.   9. Acute renal failure/CKD stage III--secondary to #1. IVF. BMP in AM. 10. Anemia of chronic disease--stable. 11. History of breast cancer 12. Dementia--stable, mild per family. Continue donepezil, Paxil.  Code Status: DNR Family Communication: discussed with daughter, son at bedside Disposition Plan: return home with daughter when improved  Time spent: 19 minutes  [name], MD  [facility] Team 5 Pager [phone]. If 7PM-7AM, please contact night-coverage at [email or web address], password [email or web address] 04/01/2012, 7:29 AM

## 2012-04-01 NOTE — ED Notes (Signed)
EKG old and new given to EDP, Opitz, MD. 

## 2012-04-01 NOTE — ED Provider Notes (Signed)
History     CSN: [id number]  Arrival date & time 04/01/12  0401   First MD Initiated Contact with Patient 04/01/12 [phone]      Chief Complaint  Patient presents with  . Hyperglycemia  . Altered Mental Status    (Consider location/radiation/quality/duration/timing/severity/associated sxs/prior treatment) HPI Hx per PT and daughter bedside. Her son came tonight and found her down on the ground, suspected downtime of up to 2 hours.  PT does not recall what happened but has small amount of bleeding over R eye.  The only pain she has is to her feet, mostly to her L great toe.  No hip or back pain. No ABD pain, no CP or SOB. Daughter bedside states it is not unusual for her to forget to take her insulin.  Past Medical History  Diagnosis Date  . 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 ANEMIA   . DEPRESSION   . HYPERTENSION   . PVD   . INSOMNIA, CHRONIC   . Dementia   . DIABETES MELLITUS, TYPE II   . Hypercholesteremia   . Chronic kidney disease (CKD), stage IV (severe)   . Anemia of chronic disease   . Angina   . Non-Q wave myocardial infarction 10/2009    /E-chart  . Inferior MI 2000    "quiet"  . Breast cancer 2000    right  . Arthritis     Past Surgical History  Procedure Date  . Capsulectomy 12/27/2010    L eye  . Breast lumpectomy 2000    right  . Cataract extraction w/ intraocular lens  implant, bilateral   . Coronary artery bypass graft 2000    CABG X1  . Coronary angioplasty with stent placement     "twice"  . Eye surgery     Cataracts (R) eye 06/1999 & (L) eye 09/1999- [place]  . Combined hysteroscopy diagnostic / d&c 07/2004    /e-chart  . Dilation and curettage of uterus     Family History  Problem Relation Age of Onset  . Coronary artery disease Father     History  Substance Use Topics  . Smoking status: Former Smoker -- 1.0 packs/day for 20 years    Types: Cigarettes  . Smokeless tobacco: Never Used   Comment: 11/21/11 "quit smoking 30-40 years  ago  . Alcohol Use: Yes     11/21/11 "used to drink quit alot; last drink of alcohol maybe 30 years ago"    OB History    Grav Para Term Preterm Abortions TAB SAB Ect Mult Living                  Review of Systems  Constitutional: Negative for fever and chills.  HENT: Negative for neck pain and neck stiffness.   Eyes: Negative for pain.  Respiratory: Negative for shortness of breath.   Cardiovascular: Negative for chest pain.  Gastrointestinal: Negative for abdominal pain.  Genitourinary: Negative for dysuria.  Musculoskeletal: Negative for back pain.  Skin: Positive for wound. Negative for rash.  Neurological: Negative for headaches.  All other systems reviewed and are negative.    Allergies  Review of patient's allergies indicates no known allergies.  Home Medications   Current Outpatient Rx  Name Route Sig Dispense Refill  . AMLODIPINE BESYLATE 10 MG PO TABS  TAKE 1 TABLET BY MOUTH DAILY 30 tablet 5  . ASPIRIN 81 MG PO TABS Oral Take 1 tablet (81 mg total) by mouth daily.  30 tablet 3  . ATORVASTATIN CALCIUM 20 MG PO TABS  TAKE 1 TABLET BY MOUTH DAILY 30 tablet 5  . CLOPIDOGREL BISULFATE 75 MG PO TABS  TAKE 1 TABLET BY MOUTH DAILY 30 tablet 5  . DONEPEZIL HCL 5 MG PO TABS  TAKE 1 TABLET BY MOUTH DAILY EVERY NIGHT AT BEDTIME 90 tablet 3  . FAMOTIDINE 20 MG PO TABS  TAKE 1 TABLET BY MOUTH EVERY NIGHT AT BEDTIME 30 tablet 5  . INSULIN ISOPHANE HUMAN 100 UNIT/ML [place] SUSP Subcutaneous Inject 25 Units into the skin daily before breakfast. 10 mL 11  . INSULIN SYRINGE 31G X 5/16" 0.5 ML MISC Does not apply 1 each by Does not apply route daily. 100 each 1  . ISOSORBIDE MONONITRATE ER 60 MG PO TB24 Oral Take 1 tablet (60 mg total) by mouth daily. 30 tablet 3  . NITROGLYCERIN 0.4 MG SL SUBL Sublingual Place 1 tablet (0.4 mg total) under the tongue every 5 (five) minutes x 3 doses as needed for chest pain. 25 tablet 3  . PAROXETINE HCL 10 MG PO TABS  TAKE 1 TABLET BY MOUTH AT BEDTIME  30 tablet 5    BP 152/55  Pulse 64  Temp 97.8 F (36.6 C) (Oral)  Resp 16  SpO2 94%  Physical Exam  Constitutional: She appears well-developed and well-nourished.  HENT:  Head: Normocephalic and atraumatic.  Eyes: Conjunctivae and EOM are normal. Pupils are equal, round, and reactive to light.  Neck: Trachea normal. Neck supple. No thyromegaly present.  Cardiovascular: Normal rate, regular rhythm, S1 normal, S2 normal and normal pulses.     No systolic murmur is present   No diastolic murmur is present  Pulses:      Radial pulses are 2+ on the right side, and 2+ on the left side.  Pulmonary/Chest: Effort normal and breath sounds normal. She has no wheezes. She has no rhonchi. She has no rales. She exhibits no tenderness.  Abdominal: Soft. Normal appearance and bowel sounds are normal. There is no tenderness. There is no CVA tenderness and negative Murphy's sign.  Musculoskeletal:       BLE:s Calves nontender, no cords or erythema, negative Homans sign  Neurological: She has normal strength. No cranial nerve deficit or sensory deficit. GCS eye subscore is 4. GCS verbal subscore is 5. GCS motor subscore is 6.       Awake and alert, no unilateral deficits  Skin: Skin is warm and dry. No rash noted. She is not diaphoretic.  Psychiatric: Her speech is normal.       Cooperative and appropriate    ED Course  Procedures (including critical care time)  Results for orders placed during the hospital encounter of 04/01/12  GLUCOSE, CAPILLARY      Component Value Range   Glucose-Capillary >600 (*) 70 - 99 mg/dL   Comment 1 Notify RN    CBC      Component Value Range   WBC 9.4  4.0 - 10.5 K/uL   RBC 3.78 (*) 3.87 - 5.11 MIL/uL   Hemoglobin 10.7 (*) 12.0 - 15.0 g/dL   HCT 45.4 (*) 09.8 - 11.9 %   MCV 89.4  78.0 - 100.0 fL   MCH 28.3  26.0 - 34.0 pg   MCHC 31.7  30.0 - 36.0 g/dL   RDW 14.7  82.9 - 56.2 %   Platelets 259  150 - 400 K/uL  COMPREHENSIVE METABOLIC PANEL       Component Value  Range   Sodium 121 (*) 135 - 145 mEq/L   Potassium 5.2 (*) 3.5 - 5.1 mEq/L   Chloride 87 (*) 96 - 112 mEq/L   CO2 19  19 - 32 mEq/L   Glucose, Bld 1018 (*) 70 - 99 mg/dL   BUN 31 (*) 6 - 23 mg/dL   Creatinine, Ser 1.61 (*) 0.50 - 1.10 mg/dL   Calcium 8.8  8.4 - 09.6 mg/dL   Total Protein 7.7  6.0 - 8.3 g/dL   Albumin 3.6  3.5 - 5.2 g/dL   AST 19  0 - 37 U/L   ALT 12  0 - 35 U/L   Alkaline Phosphatase 307 (*) 39 - 117 U/L   Total Bilirubin 0.3  0.3 - 1.2 mg/dL   GFR calc non Af Amer 23 (*) >90 mL/min   GFR calc Af Amer 27 (*) >90 mL/min  CK TOTAL AND CKMB      Component Value Range   Total CK 95  7 - 177 U/L   CK, MB 3.1  0.3 - 4.0 ng/mL   Relative Index RELATIVE INDEX IS INVALID  0.0 - 2.5  POCT I-STAT, CHEM 8      Component Value Range   Sodium 126 (*) 135 - 145 mEq/L   Potassium 5.3 (*) 3.5 - 5.1 mEq/L   Chloride 97  96 - 112 mEq/L   BUN 32 (*) 6 - 23 mg/dL   Creatinine, Ser 0.45 (*) 0.50 - 1.10 mg/dL   Glucose, Bld >409 (*) 70 - 99 mg/dL   Calcium, Ion 8.11 (*) 1.13 - 1.30 mmol/L   TCO2 18  0 - 100 mmol/L   Hemoglobin 11.9 (*) 12.0 - 15.0 g/dL   HCT 91.4 (*) 78.2 - 95.6 %   Comment NOTIFIED PHYSICIAN    BLOOD GAS, ARTERIAL      Component Value Range   FIO2 0.21     pH, Arterial 7.350  7.350 - 7.450   pCO2 arterial 35.2  35.0 - 45.0 mmHg   pO2, Arterial 63.5 (*) 80.0 - 100.0 mmHg   Bicarbonate 18.9 (*) 20.0 - 24.0 mEq/L   TCO2 17.7  0 - 100 mmol/L   Acid-base deficit 5.5 (*) 0.0 - 2.0 mmol/L   O2 Saturation 91.3     Patient temperature 98.6     Collection site LEFT RADIAL     Drawn by [id number]     Sample type ARTERIAL DRAW     Allens test (pass/fail) PASS  PASS  TROPONIN I      Component Value Range   Troponin I <0.30  <0.30 ng/mL  GLUCOSE, CAPILLARY      Component Value Range   Glucose-Capillary >600 (*) 70 - 99 mg/dL   Ct Head Wo Contrast  04/01/2012  *RADIOLOGY REPORT*  Clinical Data: The patient found on floor.  Blood on the right eyebrow.   Altered mental status.  CT HEAD WITHOUT CONTRAST  Technique:  Contiguous axial images were obtained from the base of the skull through the vertex without contrast.  Comparison: 07/11/2011  Findings: The cerebral atrophy.  Ventricular dilatation consistent with central atrophy.  Low attenuation changes in the deep white matter consistent with small vessel ischemia.  No mass effect or midline shift.  No abnormal extra-axial fluid collections.  Wallace Cullens- white matter junctions are distinct.  Basal days.  No evidence of acute intracranial hemorrhage.  No depressed skull fractures. Visualized paranasal sinuses and mastoid air cells are not opacified.  No  significant change since previous study.  Vascular calcifications.  IMPRESSION: No acute intracranial abnormalities.  Chronic-appearing atrophy and small vessel ischemic change.   Original Report Authenticated By: Marlon Pel, M.D.    Dg Chest Portable 1 View  04/01/2012  *RADIOLOGY REPORT*  Clinical Data: Altered mental status.  Fall.  PORTABLE CHEST - 1 VIEW  Comparison: 07/10/2011.  Findings: Shallow inspiration.  Mild cardiac enlargement with mild pulmonary vascular congestion.  Interstitial changes could represent fibrosis or edema.  No focal airspace consolidation.  No blunting of costophrenic angles.  No pneumothorax.  Mediastinal contours appear intact.  IMPRESSION: Cardiac enlargement with mild pulmonary vascular congestion and interstitial edema versus fibrosis.   Original Report Authenticated By: Marlon Pel, M.D.    Dg Foot Complete Left  04/01/2012  *RADIOLOGY REPORT*  Clinical Data: Pain after fall.  LEFT FOOT - COMPLETE 3+ VIEW  Comparison: None.  Findings: Diffuse bone demineralization.  Acute nondisplaced fracture of the mid shaft proximal phalanx of the left first toe. Degenerative changes in the first metatarsal phalangeal and multiple interphalangeal joints.  Erosion of the distal aspect of the proximal phalanx of the fifth toe.   Diffusely increased density in the soft tissues may be relative to the bone demineralization or could represent soft tissue hematomas.  IMPRESSION: Acute fracture of the proximal phalanx of the left first toe. Diffuse bone demineralization and degenerative changes.  Erosion of the distal aspect of the proximal phalanx of the fifth toe.   Original Report Authenticated By: Marlon Pel, M.D.    Dg Foot Complete Right  04/01/2012  *RADIOLOGY REPORT*  Clinical Data: Pain after fall.  RIGHT FOOT COMPLETE - 3+ VIEW  Comparison: None.  Findings: Diffuse bone demineralization.  Degenerative changes in the first metatarsophalangeal and interphalangeal joints.  Erosion of the distal aspect of the proximal phalanx of the right fifth toe.  Degenerative changes in the intertarsal and tarsometatarsal joints.  Vascular calcifications.  No acute fractures or subluxations.  IMPRESSION: Diffuse bone demineralization and degenerative changes.  Erosion of the distal aspect of the proximal phalanx of the fifth toe.  No acute fractures.   Original Report Authenticated By: Marlon Pel, M.D.         Date: 04/01/2012  Rate: 64  Rhythm: normal sinus rhythm  QRS Axis: left  Intervals: normal  ST/T Wave abnormalities: nonspecific ST changes  Conduction Disutrbances:none  Narrative Interpretation:   Old EKG Reviewed: unchanged  Blood sugar 1018  IV insulin  FX boot L foor Fx.  CRITICAL CARE Performed by: Sunnie Nielsen   Total critical care time: 45  Critical care time was exclusive of separately billable procedures and treating other patients.  Critical care was necessary to treat or prevent imminent or life-threatening deterioration.  Critical care was time spent personally by me on the following activities: development of treatment plan with patient and/or surrogate as well as nursing, discussions with consultants, evaluation of patient's response to treatment, examination of patient, obtaining  history from patient or surrogate, ordering and performing treatments and interventions, ordering and review of laboratory studies, ordering and review of radiographic studies, pulse oximetry and re-evaluation of patient's condition.  6:22 AM d/w Dr Lovell Sheehan plan admit step down ICU   MDM   VS and nursing notes reviewed. CT, CXR, ECG, UA, labs all reviewed. IVFs, IV insulin. MEd c/s and admit to step down ICU        Sunnie Nielsen, MD 04/01/12 [phone]

## 2012-04-01 NOTE — ED Notes (Signed)
As per EMS pt was found on floor. Family was unable to get pt up. Daughter sts pt has not taken insulin.CBG meter showing greater than 600.pwd.VSS

## 2012-04-01 NOTE — ED Notes (Addendum)
Tech reported pt was coughing up blood,upon rn assessment mouth was bright red, small amount of blood coughed up in emesis bag. hospitalist alerted and came to room to assess.

## 2012-04-01 NOTE — ED Notes (Signed)
hospitalist at bedside

## 2012-04-01 NOTE — ED Notes (Signed)
Pt requesting pain medication for leg

## 2012-04-02 DIAGNOSIS — E1101 Type 2 diabetes mellitus with hyperosmolarity with coma: Secondary | ICD-10-CM | POA: Diagnosis not present

## 2012-04-02 DIAGNOSIS — N179 Acute kidney failure, unspecified: Secondary | ICD-10-CM | POA: Diagnosis not present

## 2012-04-02 LAB — BASIC METABOLIC PANEL
BUN: 23 mg/dL (ref 6–23)
BUN: 20 mg/dL (ref 6–23)
Chloride: 101 mEq/L (ref 96–112)
Chloride: 99 mEq/L (ref 96–112)
Creatinine, Ser: 1.42 mg/dL — ABNORMAL HIGH (ref 0.50–1.10)
GFR calc Af Amer: 34 mL/min — ABNORMAL LOW (ref >90)
GFR calc Af Amer: 39 mL/min — ABNORMAL LOW (ref >90)
GFR calc non Af Amer: 30 mL/min — ABNORMAL LOW (ref >90)
Glucose, Bld: 429 mg/dL — ABNORMAL HIGH (ref 70–99)
Potassium: 3.7 mEq/L (ref 3.5–5.1)
Potassium: 3.8 mEq/L (ref 3.5–5.1)
Sodium: 129 mEq/L — ABNORMAL LOW (ref 135–145)

## 2012-04-02 LAB — CBC
HCT: 25.3 % — ABNORMAL LOW (ref 36.0–46.0)
Hemoglobin: 8.6 g/dL — ABNORMAL LOW (ref 12.0–15.0)
RBC: 2.96 MIL/uL — ABNORMAL LOW (ref 3.87–5.11)
WBC: 8 10*3/uL (ref 4.0–10.5)

## 2012-04-02 LAB — GLUCOSE, CAPILLARY
Glucose-Capillary: 145 mg/dL — ABNORMAL HIGH (ref 70–99)
Glucose-Capillary: 378 mg/dL — ABNORMAL HIGH (ref 70–99)
Glucose-Capillary: 213 mg/dL — ABNORMAL HIGH (ref 70–99)
Glucose-Capillary: 170 mg/dL — ABNORMAL HIGH (ref 70–99)

## 2012-04-02 MED ORDER — INSULIN NPH (HUMAN) (ISOPHANE) 100 UNIT/ML ~~LOC~~ SUSP
10.0000 [IU] | Freq: Two times a day (BID) | SUBCUTANEOUS | Status: DC
  Administered 2012-04-02 – 2012-04-03 (×3): 10 [IU] via SUBCUTANEOUS
  Filled 2012-04-02: qty 10

## 2012-04-02 NOTE — Progress Notes (Signed)
Clinical Social Work Department BRIEF PSYCHOSOCIAL ASSESSMENT 04/02/2012  Patient:  [name], [name]     Account Number:  [id number]     Admit date:  04/01/2012  Clinical Social Worker:  Orpah Greek  Date/Time:  04/02/2012 04:36 PM  Referred by:  Physician  Date Referred:  04/02/2012 Referred for  SNF Placement   Other Referral:   Interview type:  Patient Other interview type:   and daughter    PSYCHOSOCIAL DATA Living Status:  FAMILY Admitted from facility:   Level of care:   Primary support name:  Bluford Main (daughter) h#: [phone] c#: [phone] Primary support relationship to patient:  CHILD, ADULT Degree of support available:   good    CURRENT CONCERNS Current Concerns  Post-Acute Placement   Other Concerns:    SOCIAL WORK ASSESSMENT / PLAN CSW spoke with patient & daughter, Jon Gills (c#: [phone] h#: [phone]) at bedside re: discharge planning. Note PT recommendation of SNF at discharge. Patient agreeable.   Assessment/plan status:  Information/Referral to [facility] Other assessment/ plan:   Information/referral to community resources:   CSW completed FL2 and faxed information out to [facility] - will provide bed offers in the morning.    PATIENT'S/FAMILY'S RESPONSE TO PLAN OF CARE: Patient/family agreeable with SNF plan at discharge.        Unice Bailey, LCSW [facility] Clinical Social Worker cell #: [phone]

## 2012-04-02 NOTE — Evaluation (Signed)
Physical Therapy Evaluation Patient Details Name: [name] MRN: [id number] DOB: Mar 06, 1929 Today's Date: 04/02/2012 Time: 4098-1191 PT Time Calculation (min): 21 min  PT Assessment / Plan / Recommendation Clinical Impression  Pt admitted for hyperosmolar non-ketotic state, with hx of uncontrolled DM II.   Pt would benefit from acute PT services in order to improve independence with transfers and ambulation to prepare for d/c to next venue.  Pt reporting 6/10 pain in L foot with ambulation with RW and ambulation also limited today due to dizziness.    PT Assessment  Patient needs continued PT services    Follow Up Recommendations  Skilled nursing facility    Barriers to Discharge        Equipment Recommendations  Rolling walker with 5" wheels;3 in 1 bedside comode    Recommendations for Other Services     Frequency Min 3X/week    Precautions / Restrictions Precautions Precautions: Fall Precaution Comments: hard shoe for L foot due to L 1st proximal phalanx fx Restrictions Weight Bearing Restrictions: No   Pertinent Vitals/Pain No pain at rest, increased to 6/10 L foot with ambulation/WBing, repositioned      Mobility  Bed Mobility Bed Mobility: Supine to Sit Supine to Sit: 4: Min assist;HOB elevated Details for Bed Mobility Assistance: assist for trunk Transfers Transfers: Stand to Sit;Sit to Stand Sit to Stand: 3: Mod assist;From bed Stand to Sit: 3: Mod assist;To chair/3-in-1 Details for Transfer Assistance: increased time to attempt without assist and required mod assist with verbal cues for safe technique, required mod assist to lower into w/c due to dizziness  Ambulation/Gait Ambulation/Gait Assistance: 4: Min assist Ambulation Distance (Feet): 4 Feet Assistive device: Rolling walker Ambulation/Gait Assistance Details: pt took a couple steps, reports 6/10 pain in L foot with WBing, educated to take weight through UEs using RW, pt required min assist and asked if  dizzy reported yes so w/c brought behind pt and assisted to sitting, BP 134/50 mmHg Gait Pattern: Step-to pattern;Antalgic;Decreased stance time - left;Trunk flexed    Exercises     PT Diagnosis: Difficulty walking;Acute pain  PT Problem List: Decreased strength;Decreased activity tolerance;Decreased mobility;Decreased balance;Decreased safety awareness;Decreased knowledge of use of DME;Pain PT Treatment Interventions: DME instruction;Gait training;Functional mobility training;Therapeutic activities;Therapeutic exercise;Patient/family education;Balance training;Wheelchair mobility training;Neuromuscular re-education   PT Goals Acute Rehab PT Goals PT Goal Formulation: With patient Time For Goal Achievement: 04/09/12 Potential to Achieve Goals: Good Pt will go Sit to Supine/Side: with supervision PT Goal: Sit to Supine/Side - Progress: Goal set today Pt will go Sit to Stand: with supervision PT Goal: Sit to Stand - Progress: Goal set today Pt will go Stand to Sit: with supervision PT Goal: Stand to Sit - Progress: Goal set today Pt will Ambulate: 16 - 50 feet;with supervision;with least restrictive assistive device PT Goal: Ambulate - Progress: Goal set today  Visit Information  Last PT Received On: 04/02/12 Assistance Needed: +2    Subjective Data  Subjective: I feel woozy.   Prior Functioning  Home Living Lives With: Daughter Type of Home: House Home Access: Level entry Home Layout: One level Home Adaptive Equipment: Other (comment) (rollator) Prior Function Level of Independence: Independent with assistive device(s);Needs assistance Comments: pt reports needing occasional assist from daughter for ADLs Communication Communication: No difficulties    Cognition  Overall Cognitive Status: Appears within functional limits for tasks assessed/performed Arousal/Alertness: Awake/alert Orientation Level: Appears intact for tasks assessed Behavior During Session: Flat affect      Extremity/Trunk Assessment Right Lower  Extremity Assessment RLE ROM/Strength/Tone: [facility] for tasks assessed Left Lower Extremity Assessment LLE ROM/Strength/Tone: [facility] for tasks assessed   Balance    End of Session PT - End of Session Equipment Utilized During Treatment: Gait belt;Other (comment) (hard shoe) Activity Tolerance: Patient limited by pain;Other (comment) (limited by dizziness) Patient left: in chair;with call bell/phone within reach Nurse Communication: Other (comment) (RN asked to sit pt in w/c due to floor transfer)  GP     Jamis Kryder,KATHrine E 04/02/2012, 10:48 AM   Signature: Tamala Ser, PT, DPT Pager: [phone] 04/02/2012

## 2012-04-02 NOTE — Plan of Care (Signed)
Problem: Consults Goal: Diagnosis-Diabetes Mellitus [facility]     

## 2012-04-02 NOTE — Progress Notes (Signed)
CARE MANAGEMENT NOTE 04/02/2012  Patient:  [name], [name]   Account Number:  [id number]  Date Initiated:  04/02/2012  Documentation initiated by:  DAVIS,RHONDA  Subjective/Objective Assessment:   found in the home by daughter with glucose of greater than 600 and synco[pal episode,     Action/Plan:   lives with daughter   Anticipated DC Date:  04/05/2012   Anticipated DC Plan:  HOME/SELF CARE  In-house referral  NA      DC Planning Services  NA      [facility] Choice  NA   Choice offered to / List presented to:  NA   DME arranged  NA      DME agency  NA     HH arranged  NA      HH agency  NA   Status of service:  In process, will continue to follow Medicare Important Message given?  NA - LOS <3 / Initial given by admissions (If response is "NO", the following Medicare IM given date fields will be blank) Date Medicare IM given:   Date Additional Medicare IM given:    Discharge Disposition:    Per UR Regulation:  Reviewed for med. necessity/level of care/duration of stay  If discussed at Long Length of Stay Meetings, dates discussed:    Comments:  [id number] Earlene Plater, RN, BSN, CCM: CHART REVIEWED AND UPDATED. NO DISCHARGE NEEDS PRESENT AT THIS TIME. CASE MANAGEMENT [phone]

## 2012-04-02 NOTE — Progress Notes (Signed)
Patient admitted with glucose of 1018 mg/dl.  Admits to noncompliance with insulin at home.  Lives with daughter who helps with care at home.  Spoke with patient about the importance of taking insulin all the time.  Discussed the significance of high blood sugars and explained to patient and daughter the risks associated with high blood sugars (dehydration, electrolyte loss, possibility of progression to DKA).  Also discussed the care we have given her so far (rehydration with IVF, insulin drip, etc.).  Noted patient now back on SQ regimen of NPH and Novolog.  Encouraged patient (and daughter) to check blood sugars at least bid at home and to record all results.  Reminded patient to take her blood sugar log book to her next PCP appt with Dr. Felicity Coyer.  Will follow. Ambrose Finland RN, MSN, CDE Diabetes Coordinator Inpatient Diabetes Program [phone]

## 2012-04-02 NOTE — Progress Notes (Signed)
Pt had 4 consecutive blood sugars within target range. Provider on call notified. Orders received.

## 2012-04-02 NOTE — Progress Notes (Signed)
[facility] PROGRESS NOTE  [name] HQI:[id number] DOB: [birth year] DOA: 04/01/2012 PCP: Rene Paci, MD  Assessment/Plan: 1. Hyperosmolar non-ketotic state--resolved, secondary to non-compliance. 2. DM type 2, uncontrolled--check Hgb A1c. Repeat BMP today to follow-up for low bicarb.  Already given Lantus last night, change to home NPH 9/10 AM. 3. Acute renal failure/CKD stage III--resolved, secondary to #1.  4. Fall/syncope--secondary to above. PT eval pending. 5. Acute fracture left first toe--proximal phalanx, non-displaced. Hard shoe, outpatient orthopedic follow-up. 6. Left wrist pain--X-rays negative. 7. Elevated alkaline phosphatase--chronic to at least 05/2011. Follow-up as outpatient. 8. History of CAD--stable. Continue clopidogrel, ASA, isosorbide mononitrate, statin.  9. Anemia of chronic disease--stable. 10. History of breast cancer 11. Dementia--stable, mild per family. Continue donepezil, Paxil.  Improved, transfer to floor.  Code Status: DNR  Family Communication: discussed with daughter at bedside Disposition Plan: return home with daughter when improved   [name], MD  [facility] Team 5 Pager [phone]. If 7PM-7AM, please contact night-coverage at [email or web address], password [facility] 04/02/2012, 7:57 AM  LOS: 1 day   Brief narrative: 78 year old woman presented with fall, syncope, non-compliance with insulin. CBG >1000. Admitted for hyperosmolar non-ketotic state.  Consultants:  none  Procedures:  none  Antibiotics:  none  HPI/Subjective: Minor hemoptysis yesterday. Afebrile, vitals stable. No complaints today, eating ok. Discussed with RN--insulin infusion stopped last night, no concerns.  Objective: Filed Vitals:   04/02/12 0000 04/02/12 0200 04/02/12 0400 04/02/12 0600  BP: 130/43 132/45 132/49 137/54  Pulse: 69 65 63 63  Temp: 98.9 F (37.2 C)  97.7 F (36.5 C)   TempSrc: Oral  Oral   Resp: 20 18 20 17   Height:        Weight:      SpO2: 96% 97% 99% 98%    Intake/Output Summary (Last 24 hours) at 04/02/12 0757 Last data filed at 04/02/12 0600  Gross per 24 hour  Intake 4388.35 ml  Output   1120 ml  Net 3268.35 ml   Filed Weights   04/01/12 0916  Weight: 78.7 kg (173 lb 8 oz)    Exam:  General:  Appears calm and comfortable  Cardiovascular: RRR, no m/r/g. No LE edema.  Respiratory: CTA bilaterally, no w/r/r. Normal respiratory effort.  Musculoskeletal: left foot in hard soled shoe  Psychiatric: grossly normal mood and affect, speech fluent and appropriate  Data Reviewed: Basic Metabolic Panel:  Lab 04/02/12 0272 04/01/12 0442 04/01/12 0410  NA 129* 126* 121*  K 3.7 5.3* 5.2*  CL 101 97 87*  CO2 17* -- 19  GLUCOSE 278* >700* 1018*  BUN 23 32* 31*  CREATININE 1.58* 2.10* 1.93*  CALCIUM 7.9* -- 8.8  MG -- -- --  PHOS -- -- --   Liver Function Tests:  Lab 04/01/12 0410  AST 19  ALT 12  ALKPHOS 307*  BILITOT 0.3  PROT 7.7  ALBUMIN 3.6   CBC:  Lab 04/02/12 0329 04/01/12 0442 04/01/12 0410  WBC 8.0 -- 9.4  NEUTROABS -- -- --  HGB 8.6* 11.9* 10.7*  HCT 25.3* 35.0* 33.8*  MCV 85.5 -- 89.4  PLT 203 -- 259   Cardiac Enzymes:  Lab 04/01/12 1258 04/01/12 0410  CKTOTAL -- 95  CKMB -- 3.1  CKMBINDEX -- --  TROPONINI <0.30 <0.30   CBG:  Lab 04/02/12 0737 04/02/12 0136 04/01/12 2314 04/01/12 2200 04/01/12 2051  GLUCAP 378* 223* 145* 131* 122*    Recent Results (from the past 240 hour(s))  MRSA PCR SCREENING  Status: Normal   Collection Time   04/26/2012  9:59 AM      Component Value Range Status Comment   MRSA by PCR NEGATIVE  NEGATIVE Final      Studies: Dg Wrist Complete Left  04/26/2012  *RADIOLOGY REPORT*  Clinical Data: History of fall complaining of wrist pain.  LEFT WRIST - COMPLETE 3+ VIEW  Comparison: No priors.  Findings: Bones are osteopenic.  No definite acute displaced fractures are identified.  There are degenerative changes of osteoarthritis  throughout the wrist with multifocal areas of joint space narrowing, subchondral sclerosis and subchondral cyst formation, including some subchondral cysts on either side of the scapholunate joint.  The bones are osteopenic.  IMPRESSION: 1.  No acute radiographic abnormality of the left wrist. 2.  Mild degenerative changes of osteoarthritis, as above. 3.  Osteopenia.   Original Report Authenticated By: Florencia Reasons, M.D.    Ct Head Wo Contrast  04/26/2012  *RADIOLOGY REPORT*  Clinical Data: The patient found on floor.  Blood on the right eyebrow.  Altered mental status.  CT HEAD WITHOUT CONTRAST  Technique:  Contiguous axial images were obtained from the base of the skull through the vertex without contrast.  Comparison: 07/11/2011  Findings: The cerebral atrophy.  Ventricular dilatation consistent with central atrophy.  Low attenuation changes in the deep white matter consistent with small vessel ischemia.  No mass effect or midline shift.  No abnormal extra-axial fluid collections.  Wallace Cullens- white matter junctions are distinct.  Basal days.  No evidence of acute intracranial hemorrhage.  No depressed skull fractures. Visualized paranasal sinuses and mastoid air cells are not opacified.  No significant change since previous study.  Vascular calcifications.  IMPRESSION: No acute intracranial abnormalities.  Chronic-appearing atrophy and small vessel ischemic change.   Original Report Authenticated By: Marlon Pel, M.D.    Dg Chest Portable 1 View  2012/04/26  *RADIOLOGY REPORT*  Clinical Data: Altered mental status.  Fall.  PORTABLE CHEST - 1 VIEW  Comparison: 07/10/2011.  Findings: Shallow inspiration.  Mild cardiac enlargement with mild pulmonary vascular congestion.  Interstitial changes could represent fibrosis or edema.  No focal airspace consolidation.  No blunting of costophrenic angles.  No pneumothorax.  Mediastinal contours appear intact.  IMPRESSION: Cardiac enlargement with mild pulmonary  vascular congestion and interstitial edema versus fibrosis.   Original Report Authenticated By: Marlon Pel, M.D.    Dg Foot Complete Left  04/26/2012  *RADIOLOGY REPORT*  Clinical Data: Pain after fall.  LEFT FOOT - COMPLETE 3+ VIEW  Comparison: None.  Findings: Diffuse bone demineralization.  Acute nondisplaced fracture of the mid shaft proximal phalanx of the left first toe. Degenerative changes in the first metatarsal phalangeal and multiple interphalangeal joints.  Erosion of the distal aspect of the proximal phalanx of the fifth toe.  Diffusely increased density in the soft tissues may be relative to the bone demineralization or could represent soft tissue hematomas.  IMPRESSION: Acute fracture of the proximal phalanx of the left first toe. Diffuse bone demineralization and degenerative changes.  Erosion of the distal aspect of the proximal phalanx of the fifth toe.   Original Report Authenticated By: Marlon Pel, M.D.    Dg Foot Complete Right  Apr 26, 2012  *RADIOLOGY REPORT*  Clinical Data: Pain after fall.  RIGHT FOOT COMPLETE - 3+ VIEW  Comparison: None.  Findings: Diffuse bone demineralization.  Degenerative changes in the first metatarsophalangeal and interphalangeal joints.  Erosion of the distal aspect of the proximal  phalanx of the right fifth toe.  Degenerative changes in the intertarsal and tarsometatarsal joints.  Vascular calcifications.  No acute fractures or subluxations.  IMPRESSION: Diffuse bone demineralization and degenerative changes.  Erosion of the distal aspect of the proximal phalanx of the fifth toe.  No acute fractures.   Original Report Authenticated By: Marlon Pel, M.D.     Scheduled Meds:   . amLODipine  10 mg Oral Daily  . antiseptic oral rinse  15 mL Mouth Rinse BID  . aspirin EC  81 mg Oral Daily  . atorvastatin  20 mg Oral q1800  . clopidogrel  75 mg Oral Q breakfast  . donepezil  5 mg Oral QHS  . famotidine  20 mg Oral QHS  . insulin aspart   0-15 Units Subcutaneous TID WC  . insulin aspart  0-5 Units Subcutaneous QHS  . insulin glargine  10 Units Subcutaneous QHS  . insulin regular  0-10 Units Intravenous TID WC  . isosorbide mononitrate  60 mg Oral Daily  . PARoxetine  10 mg Oral QHS  . sodium chloride  1,000 mL Intravenous Once  . sodium chloride  3 mL Intravenous Q12H  . DISCONTD: sodium chloride   Intravenous STAT  . DISCONTD: insulin regular  0-10 Units Intravenous TID WC   Continuous Infusions:   . sodium chloride Stopped (04/01/12 1401)  . dextrose 5 % and 0.45% NaCl 100 mL/hr (04/02/12 0019)  . insulin (NOVOLIN-R) infusion Stopped (04/02/12 0145)  . DISCONTD: sodium chloride 125 mL/hr at 04/01/12 0530  . DISCONTD: sodium chloride 125 mL/hr at 04/01/12 0530  . DISCONTD: insulin (NOVOLIN-R) infusion 12.8 Units/hr (04/01/12 1478)  . DISCONTD: insulin (NOVOLIN-R) infusion 2.1 Units/hr (04/01/12 2202)    Principal Problem:  *Hyperosmolar (nonketotic) coma Active Problems:  Type II or unspecified type diabetes mellitus without mention of complication, uncontrolled  ANEMIA  CAD  SYNCOPE  Fracture of left toe  Wrist pain, left  Elevated alkaline phosphatase level  CKD (chronic kidney disease), stage III  Acute renal failure     [name], MD  [facility] Team 5 Pager [phone]. If 7PM-7AM, please contact night-coverage at [email or web address], password [facility] 04/02/2012, 7:57 AM  LOS: 1 day   Time spent: 15 minutes

## 2012-04-03 ENCOUNTER — Inpatient Hospital Stay (HOSPITAL_COMMUNITY): Payer: Medicare Other

## 2012-04-03 DIAGNOSIS — N179 Acute kidney failure, unspecified: Secondary | ICD-10-CM | POA: Diagnosis not present

## 2012-04-03 DIAGNOSIS — Z043 Encounter for examination and observation following other accident: Secondary | ICD-10-CM | POA: Diagnosis not present

## 2012-04-03 DIAGNOSIS — S92919A Unspecified fracture of unspecified toe(s), initial encounter for closed fracture: Secondary | ICD-10-CM | POA: Diagnosis not present

## 2012-04-03 DIAGNOSIS — M25519 Pain in unspecified shoulder: Secondary | ICD-10-CM | POA: Diagnosis not present

## 2012-04-03 DIAGNOSIS — E1101 Type 2 diabetes mellitus with hyperosmolarity with coma: Secondary | ICD-10-CM | POA: Diagnosis not present

## 2012-04-03 LAB — BASIC METABOLIC PANEL
CO2: 18 mEq/L — ABNORMAL LOW (ref 19–32)
Chloride: 105 mEq/L (ref 96–112)
Creatinine, Ser: 1.22 mg/dL — ABNORMAL HIGH (ref 0.50–1.10)
GFR calc Af Amer: 47 mL/min — ABNORMAL LOW (ref >90)
Potassium: 3.7 mEq/L (ref 3.5–5.1)

## 2012-04-03 LAB — GLUCOSE, CAPILLARY
Glucose-Capillary: 235 mg/dL — ABNORMAL HIGH (ref 70–99)
Glucose-Capillary: 258 mg/dL — ABNORMAL HIGH (ref 70–99)
Glucose-Capillary: 248 mg/dL — ABNORMAL HIGH (ref 70–99)
Glucose-Capillary: 309 mg/dL — ABNORMAL HIGH (ref 70–99)

## 2012-04-03 MED ORDER — INSULIN NPH (HUMAN) (ISOPHANE) 100 UNIT/ML ~~LOC~~ SUSP
10.0000 [IU] | Freq: Every day | SUBCUTANEOUS | Status: DC
  Administered 2012-04-04: 10 [IU] via SUBCUTANEOUS
  Filled 2012-04-03: qty 10

## 2012-04-03 MED ORDER — ZOLPIDEM TARTRATE 5 MG PO TABS: 5.0000 mg | ORAL_TABLET | Freq: Every evening | ORAL | Status: DC | PRN

## 2012-04-03 MED ORDER — INSULIN ASPART 100 UNIT/ML ~~LOC~~ SOLN
3.0000 [IU] | Freq: Three times a day (TID) | SUBCUTANEOUS | Status: DC
  Administered 2012-04-04 (×2): 3 [IU] via SUBCUTANEOUS

## 2012-04-03 MED ORDER — INSULIN NPH (HUMAN) (ISOPHANE) 100 UNIT/ML ~~LOC~~ SUSP
15.0000 [IU] | Freq: Every day | SUBCUTANEOUS | Status: DC
  Administered 2012-04-03: 15 [IU] via SUBCUTANEOUS
  Filled 2012-04-03: qty 10

## 2012-04-03 NOTE — Progress Notes (Signed)
Inpatient Diabetes Program Recommendations  AACE/ADA: New Consensus Statement on Inpatient Glycemic Control (2013)  Target Ranges:  Prepandial:   less than 140 mg/dL      Peak postprandial:   less than 180 mg/dL (1-2 hours)      Critically ill patients:  140 - 180 mg/dL   Results for LEXII, DOUCETT (MRN [id number]) as of 04/03/2012 13:53  Ref. Range 04/02/2012 07:37 04/02/2012 12:52 04/02/2012 16:52 04/02/2012 22:10  Glucose-Capillary Latest Range: 70-99 mg/dL 409 (H) 811 (H) 914 (H) 170 (H)   Results for WYETTA, HOPPERT (MRN [id number]) as of 04/03/2012 13:53  Ref. Range 04/03/2012 07:52 04/03/2012 12:00  Glucose-Capillary Latest Range: 70-99 mg/dL 086 (H) 578 (H)     Inpatient Diabetes Program Recommendations Insulin - Basal: Fasting sugar elevated- Please increase HS dose of NPH to 15 units QHS Insulin - Meal Coverage: Postprandial CBGs elevated- Please consider adding meal coverage- Novolog 3 units tid with meals.  Note: Will follow. Ambrose Finland RN, MSN, CDE Diabetes Coordinator Inpatient Diabetes Program [phone]

## 2012-04-03 NOTE — Progress Notes (Signed)
Physical Therapy Treatment Patient Details Name: [name] MRN: [id number] DOB: [birth year] Today's Date: 04/03/2012 Time: 7846-9629 PT Time Calculation (min): 25 min  PT Assessment / Plan / Recommendation Comments on Treatment Session  Pt. presents today w/ c/o R shoulder pain and not being able to use It to reach for phone. Noted to ahve difficulty getting Pill cup to mouth and tip to it empty pills. RN noted. Pt. had decreased pain of L foot. Pt. reports plan to go to ALF eventually. Recommend SNF ffrom hospital. Pt. states daughter s work  at [employer].    Follow Up Recommendations  Skilled nursing facility    Barriers to Discharge        Equipment Recommendations  Rolling walker with 5" wheels    Recommendations for Other Services OT consult (for RUE eval/ADL's)  Frequency Min 3X/week   Plan Discharge plan remains appropriate;Frequency remains appropriate    Precautions / Restrictions Precautions Precautions: Fall Precaution Comments: hard shoe for L foot due to L 1st proximal phalanx fx ,  R shoulder pain/dysfunction. Restrictions Weight Bearing Restrictions: No   Pertinent Vitals/Pain 5-6 R shoulder, RN aware and pt declined meds,.    Mobility  Bed Mobility Supine to Sit: 4: Min assist;HOB elevated Details for Bed Mobility Assistance: Pt. unable to effectively use R UE to push/opull due to pain of shoulder. Min assist to get to edge of bed and to scooot. Transfers Transfers: Sit to Stand;Stand to [employer] Transfers Sit to Stand: 3: Mod assist Stand to Sit: 4: Min assist (x 2 practices.) Stand Pivot Transfers: 3: Mod assist Details for Transfer Assistance: increased time , VC for use of RW.  Pt was able to use RUE  on RW but w/ discomfort. Ambulation/Gait Ambulation/Gait Assistance: 4: Min assist Ambulation/Gait Assistance Details: Pt took 4-5 steps to get to recliner., Pt. reports pain of foot not as painful. declined Pain meds. Gait Pattern: Step-to  pattern;Antalgic;Decreased stance time - left    Exercises     PT Diagnosis:    PT Problem List:   PT Treatment Interventions:     PT Goals Acute Rehab PT Goals Pt will go Supine/Side to Sit: with supervision PT Goal: Supine/Side to Sit - Progress: Goal set today Pt will go Sit to Supine/Side: with supervision PT Goal: Sit to Supine/Side - Progress: Progressing toward goal Pt will go Sit to Stand: with supervision PT Goal: Sit to Stand - Progress: Progressing toward goal Pt will go Stand to Sit: with supervision PT Goal: Stand to Sit - Progress: Progressing toward goal Pt will Ambulate: 16 - 50 feet;with supervision;with least restrictive assistive device PT Goal: Ambulate - Progress: Progressing toward goal  Visit Information  Last PT Received On: 04/03/12 Assistance Needed: +1 (+2 for ambulation)    Subjective Data  Subjective: I am not worried about my foot. I cannot use my R arm well.   Cognition  Overall Cognitive Status: Appears within functional limits for tasks assessed/performed    Balance     End of Session PT - End of Session Activity Tolerance: Patient tolerated treatment well Patient left: in chair;with call bell/phone within reach;with chair alarm set Nurse Communication: Mobility status   GP     Rada Hay 04/03/2012, 9:52 AM

## 2012-04-03 NOTE — Progress Notes (Signed)
Clinical Social Work Department CLINICAL SOCIAL WORK PLACEMENT NOTE 04/03/2012  Patient:  [name], [name]  Account Number:  [id number] Admit date:  04/01/2012  Clinical Social Worker:  Orpah Greek  Date/time:  04/03/2012 09:21 AM  Clinical Social Work is seeking post-discharge placement for this patient at the following level of care:   SKILLED NURSING   (*CSW will update this form in Epic as items are completed)   04/03/2012  Patient/family provided with [facility] Department of Clinical Social Work's list of facilities offering this level of care within the geographic area requested by the patient (or if unable, by the patient's family).  04/03/2012  Patient/family informed of their freedom to choose among providers that offer the needed level of care, that participate in Medicare, Medicaid or managed care program needed by the patient, have an available bed and are willing to accept the patient.  04/03/2012  Patient/family informed of [facility]' ownership interest in [facility], as well as of the fact that they are under no obligation to receive care at this facility.  PASARR submitted to EDS on 04/03/2012 PASARR number received from EDS on 04/03/2012  FL2 transmitted to all facilities in geographic area requested by pt/family on  04/03/2012 FL2 transmitted to all facilities within larger geographic area on   Patient informed that his/her managed care company has contracts with or will negotiate with  certain facilities, including the following:     Patient/family informed of bed offers received:   Patient chooses bed at  Physician recommends and patient chooses bed at    Patient to be transferred to  on   Patient to be transferred to facility by   The following physician request were entered in Epic:   Additional Comments:  Unice Bailey, LCSW [facility] Clinical Social Worker cell #: [phone]

## 2012-04-03 NOTE — Progress Notes (Signed)
Patient has accepted bed offer at [facility]. Daughter aware. Anticipating discharge tomorrow.   Unice Bailey, LCSW [facility] Clinical Social Worker cell #: [phone]

## 2012-04-03 NOTE — Progress Notes (Signed)
[facility] PROGRESS NOTE  [name] ZOX:[id number] DOB: 28-Nov-1928 DOA: 04/01/2012 PCP: Rene Paci, MD  Assessment/Plan: 1. Hyperosmolar non-ketotic state--resolved, secondary to non-compliance. 2. DM type 2, uncontrolled--check Hgb A1c. Continue NPH, increase to 15 units QHS, add Novolog meal coverage 3 units TID. 3. Acute renal failure/CKD stage III--resolved, secondary to #1.  4. Fall/syncope--secondary to above. PT eval noted 5. Acute fracture left first toe--proximal phalanx, non-displaced. Hard shoe, outpatient orthopedic follow-up. 6. Left wrist pain/right shoulder pain--X-rays negative. 7. Elevated alkaline phosphatase--chronic to at least 05/2011. Follow-up as outpatient. 8. History of CAD--stable. Continue clopidogrel, ASA, isosorbide mononitrate, statin.  9. Anemia of chronic disease--stable. 10. History of breast cancer 11. Dementia--stable, mild per family. Continue donepezil, Paxil.  Code Status: DNR  Family Communication: discussed with daughter at bedside Disposition Plan: return home with daughter when improved   [name], MD  [facility] Team 5 Pager [phone]. If 7PM-7AM, please contact night-coverage at [email or web address], password [facility] 04/03/2012, 7:09 PM  LOS: 2 days   Brief narrative: 78 year old woman presented with fall, syncope, non-compliance with insulin. CBG >1000. Admitted for hyperosmolar non-ketotic state.  Consultants:  PT--SNF  Procedures:  none  Antibiotics:  none  HPI/Subjective: Minor hemoptysis yesterday. Afebrile, vitals stable. No complaints today, eating ok. Discussed with RN--insulin infusion stopped last night, no concerns.  Objective: Filed Vitals:   04/02/12 1304 04/02/12 2205 04/03/12 0625 04/03/12 1505  BP: 131/67 162/73 140/51 157/77  Pulse: 59 65 67 64  Temp: 97.6 F (36.4 C) 98.3 F (36.8 C) 98.2 F (36.8 C) 97.5 F (36.4 C)  TempSrc: Oral Oral Oral Oral  Resp: 20 20 18 18   Height:        Weight:      SpO2: 94% 96% 98% 92%    Intake/Output Summary (Last 24 hours) at 04/03/12 1909 Last data filed at 04/03/12 1214  Gross per 24 hour  Intake   1575 ml  Output   1425 ml  Net    150 ml   Filed Weights   04/01/12 0916  Weight: 78.7 kg (173 lb 8 oz)    Exam:  General:  Appears calm and comfortable  Cardiovascular: RRR, no m/r/g. No LE edema.  Respiratory: CTA bilaterally, no w/r/r. Normal respiratory effort.  Musculoskeletal: left foot in hard soled shoe  Psychiatric: grossly normal mood and affect, speech fluent and appropriate  Data Reviewed: Basic Metabolic Panel:  Lab 04/03/12 8295 04/02/12 0902 04/02/12 0329 04/01/12 0442 04/01/12 0410  NA 133* 126* 129* 126* 121*  K 3.7 3.8 3.7 5.3* 5.2*  CL 105 99 101 97 87*  CO2 18* 17* 17* -- 19  GLUCOSE 247* 429* 278* >700* 1018*  BUN 15 20 23  32* 31*  CREATININE 1.22* 1.42* 1.58* 2.10* 1.93*  CALCIUM 8.2* 7.9* 7.9* -- 8.8  MG -- -- -- -- --  PHOS -- -- -- -- --   Liver Function Tests:  Lab 04/01/12 0410  AST 19  ALT 12  ALKPHOS 307*  BILITOT 0.3  PROT 7.7  ALBUMIN 3.6   CBC:  Lab 04/02/12 0329 04/01/12 0442 04/01/12 0410  WBC 8.0 -- 9.4  NEUTROABS -- -- --  HGB 8.6* 11.9* 10.7*  HCT 25.3* 35.0* 33.8*  MCV 85.5 -- 89.4  PLT 203 -- 259   Cardiac Enzymes:  Lab 04/01/12 1258 04/01/12 0410  CKTOTAL -- 95  CKMB -- 3.1  CKMBINDEX -- --  TROPONINI <0.30 <0.30   CBG:  Lab 04/03/12 1709 04/03/12 1200 04/03/12 0752 04/02/12 2210  04/02/12 1652  GLUCAP 248* 258* 235* 170* 213*    Recent Results (from the past 240 hour(s))  MRSA PCR SCREENING     Status: Normal   Collection Time   04/01/12  9:59 AM      Component Value Range Status Comment   MRSA by PCR NEGATIVE  NEGATIVE Final      Studies: Dg Shoulder Right  04/03/2012  *RADIOLOGY REPORT*  Clinical Data: Fall  RIGHT SHOULDER - 2+ VIEW  Comparison: None.  Findings: Limited range of motion in the glenohumeral joint.  No obvious fracture or  dislocation.  Unremarkable soft tissues.  IMPRESSION: No acute bony pathology.   Original Report Authenticated By: Donavan Burnet, M.D.    Dg Humerus Right  04/03/2012  *RADIOLOGY REPORT*  Clinical Data: Fall  RIGHT HUMERUS - 2+ VIEW  Comparison: None.  Findings: Acute fracture and no dislocation.  IMPRESSION: No acute bony pathology.   Original Report Authenticated By: Donavan Burnet, M.D.     Scheduled Meds:    . amLODipine  10 mg Oral Daily  . antiseptic oral rinse  15 mL Mouth Rinse BID  . aspirin EC  81 mg Oral Daily  . atorvastatin  20 mg Oral q1800  . clopidogrel  75 mg Oral Q breakfast  . donepezil  5 mg Oral QHS  . famotidine  20 mg Oral QHS  . insulin aspart  0-15 Units Subcutaneous TID WC  . insulin aspart  0-5 Units Subcutaneous QHS  . insulin NPH  10 Units Subcutaneous BID  . isosorbide mononitrate  60 mg Oral Daily  . PARoxetine  10 mg Oral QHS  . sodium chloride  3 mL Intravenous Q12H   Continuous Infusions:    . sodium chloride 75 mL/hr at 04/03/12 1500    Principal Problem:  *Hyperosmolar (nonketotic) coma Active Problems:  Type II or unspecified type diabetes mellitus without mention of complication, uncontrolled  ANEMIA  CAD  SYNCOPE  Fracture of left toe  Wrist pain, left  Elevated alkaline phosphatase level  CKD (chronic kidney disease), stage III  Acute renal failure     [name], MD  [facility] Team 5 Pager [phone]. If 7PM-7AM, please contact night-coverage at [email or web address], password [facility] 04/03/2012, 7:09 PM  LOS: 2 days   Time spent: 15 minutes

## 2012-04-04 DIAGNOSIS — E119 Type 2 diabetes mellitus without complications: Secondary | ICD-10-CM | POA: Diagnosis not present

## 2012-04-04 DIAGNOSIS — D649 Anemia, unspecified: Secondary | ICD-10-CM | POA: Diagnosis not present

## 2012-04-04 DIAGNOSIS — E109 Type 1 diabetes mellitus without complications: Secondary | ICD-10-CM | POA: Diagnosis not present

## 2012-04-04 DIAGNOSIS — N179 Acute kidney failure, unspecified: Secondary | ICD-10-CM | POA: Diagnosis not present

## 2012-04-04 DIAGNOSIS — E1101 Type 2 diabetes mellitus with hyperosmolarity with coma: Secondary | ICD-10-CM | POA: Diagnosis not present

## 2012-04-04 DIAGNOSIS — R262 Difficulty in walking, not elsewhere classified: Secondary | ICD-10-CM | POA: Diagnosis not present

## 2012-04-04 DIAGNOSIS — I10 Essential (primary) hypertension: Secondary | ICD-10-CM | POA: Diagnosis not present

## 2012-04-04 DIAGNOSIS — E785 Hyperlipidemia, unspecified: Secondary | ICD-10-CM | POA: Diagnosis not present

## 2012-04-04 DIAGNOSIS — E1300 Other specified diabetes mellitus with hyperosmolarity without nonketotic hyperglycemic-hyperosmolar coma (NKHHC): Secondary | ICD-10-CM | POA: Diagnosis not present

## 2012-04-04 DIAGNOSIS — R55 Syncope and collapse: Secondary | ICD-10-CM | POA: Diagnosis not present

## 2012-04-04 DIAGNOSIS — M6281 Muscle weakness (generalized): Secondary | ICD-10-CM | POA: Diagnosis not present

## 2012-04-04 DIAGNOSIS — K5900 Constipation, unspecified: Secondary | ICD-10-CM | POA: Diagnosis not present

## 2012-04-04 DIAGNOSIS — R52 Pain, unspecified: Secondary | ICD-10-CM | POA: Diagnosis not present

## 2012-04-04 DIAGNOSIS — IMO0001 Reserved for inherently not codable concepts without codable children: Secondary | ICD-10-CM | POA: Diagnosis not present

## 2012-04-04 DIAGNOSIS — E1100 Type 2 diabetes mellitus with hyperosmolarity without nonketotic hyperglycemic-hyperosmolar coma (NKHHC): Secondary | ICD-10-CM | POA: Diagnosis not present

## 2012-04-04 DIAGNOSIS — Z5189 Encounter for other specified aftercare: Secondary | ICD-10-CM | POA: Diagnosis not present

## 2012-04-04 DIAGNOSIS — I2510 Atherosclerotic heart disease of native coronary artery without angina pectoris: Secondary | ICD-10-CM | POA: Diagnosis not present

## 2012-04-04 DIAGNOSIS — F0390 Unspecified dementia without behavioral disturbance: Secondary | ICD-10-CM | POA: Diagnosis not present

## 2012-04-04 DIAGNOSIS — S92909A Unspecified fracture of unspecified foot, initial encounter for closed fracture: Secondary | ICD-10-CM | POA: Diagnosis not present

## 2012-04-04 DIAGNOSIS — S92919A Unspecified fracture of unspecified toe(s), initial encounter for closed fracture: Secondary | ICD-10-CM | POA: Diagnosis not present

## 2012-04-04 DIAGNOSIS — G40909 Epilepsy, unspecified, not intractable, without status epilepticus: Secondary | ICD-10-CM | POA: Diagnosis not present

## 2012-04-04 DIAGNOSIS — E1129 Type 2 diabetes mellitus with other diabetic kidney complication: Secondary | ICD-10-CM | POA: Diagnosis not present

## 2012-04-04 LAB — BASIC METABOLIC PANEL
GFR calc Af Amer: 45 mL/min — ABNORMAL LOW (ref >90)
GFR calc non Af Amer: 39 mL/min — ABNORMAL LOW (ref >90)
Glucose, Bld: 207 mg/dL — ABNORMAL HIGH (ref 70–99)
Potassium: 3.6 mEq/L (ref 3.5–5.1)
Sodium: 136 mEq/L (ref 135–145)

## 2012-04-04 LAB — GLUCOSE, CAPILLARY: Glucose-Capillary: 219 mg/dL — ABNORMAL HIGH (ref 70–99)

## 2012-04-04 MED ORDER — SODIUM BICARBONATE 650 MG PO TABS: 650.0000 mg | ORAL_TABLET | Freq: Three times a day (TID) | ORAL | Status: DC

## 2012-04-04 MED ORDER — INSULIN NPH (HUMAN) (ISOPHANE) 100 UNIT/ML ~~LOC~~ SUSP: 10.0000 [IU] | Freq: Every day | SUBCUTANEOUS | Status: DC

## 2012-04-04 MED ORDER — INSULIN NPH (HUMAN) (ISOPHANE) 100 UNIT/ML ~~LOC~~ SUSP: 15.0000 [IU] | Freq: Every day | SUBCUTANEOUS | Status: DC

## 2012-04-04 MED ORDER — INSULIN ASPART 100 UNIT/ML ~~LOC~~ SOLN: 3.0000 [IU] | Freq: Three times a day (TID) | SUBCUTANEOUS | Status: DC

## 2012-04-04 MED ORDER — POLYETHYLENE GLYCOL 3350 17 G PO PACK: 17.0000 g | PACK | Freq: Every day | ORAL | Status: AC | PRN

## 2012-04-04 MED ORDER — SODIUM BICARBONATE 650 MG PO TABS
650.0000 mg | ORAL_TABLET | Freq: Three times a day (TID) | ORAL | Status: DC
  Administered 2012-04-04: 650 mg via ORAL
  Filled 2012-04-04 (×3): qty 1

## 2012-04-04 MED ORDER — HYDROCODONE-ACETAMINOPHEN 5-325 MG PO TABS: 1.0000 | ORAL_TABLET | ORAL | Status: AC | PRN

## 2012-04-04 NOTE — Discharge Summary (Signed)
Physician Discharge Summary  [name] JYN:[id number] DOB: 1929-05-06 DOA: 04/01/2012  PCP: Rene Paci, MD  Admit date: 04/01/2012 Discharge date: 04/04/2012  Recommendations for Outpatient Follow-up:  #1. Patient will need a basic metabolic profile done one week post discharge to followup on electrolytes and renal function. Patient has been started sodium bicarbonate tablets for her mild acidosis this will need to be followed up upon. #2 patient will need a referral to follow up with orthopedics as outpatient 1 nondisplaced left toe fracture. #3 patient will need a CBGs followed up upon and if needed her NPH can be further titrated for better control.  Discharge Diagnoses:  Principal Problem:  *Hyperosmolar (nonketotic) coma Active Problems:  Type II or unspecified type diabetes mellitus without mention of complication, uncontrolled  ANEMIA  CAD  SYNCOPE  Fracture of left toe  Wrist pain, left  Elevated alkaline phosphatase level  CKD (chronic kidney disease), stage III  Acute renal failure   Discharge Condition: Stable and improved  Diet recommendation: Carb modified diet  Filed Weights   04/01/12 0916  Weight: 78.7 kg (173 lb 8 oz)    History of present illness:  78 year old woman with history of DM presented with daughter, son to ED from home. History obtained primarily from daughter at bedside. Patient fell yesterday. Daughter called to check on early this AM around 3. Patient reported she was on commode and unable to get up. When daughter arrived home 1.5 hours later patient was still on commode. Daughter helped patient back to bed, but patient lost balance and fell ("passed out") onto carpet, did not strike head. Patient complained of left great toe pain.   Hospital Course:  #1 hyperosmolar nonketotic state Patient was admitted with a hyperosmolar nonketotic state with a blood glucose of greater than 600 when seen by EMS and BMET done with a glucose level of 1018  with a urine glucose greater than 1000. Patient did not have any elevated anion gap although she had a mild acidosis felt to be secondary to her chronic kidney disease. Patient was hydrated with IV fluids and put on the insulin glucose stabilized. Patient was monitored and subsequently transitioned to NPH. Urinalysis which was done was negative for any infectious etiology chest x-ray which was done was negative for a pneumonia. Patient had a normal white count and remained in stable condition. Patient improved clinically and was felt his syncope was secondary to a hyperosmolar nonketotic state. Patient's NPH was subsequently titrated for better glucose control and 3 units NovoLog was added for meal coverage. Hemoglobin A1c which was done in April 2013 was elevated at 11.7. Troponins which were done were negative x3. It was felt patient's hyperosmolar nonketotic state or secondary to noncompliance. Patient was seen by the diabetic coordinator and information given to patient and her daughter per the diabetic coordinator. Patient remained in stable condition and will be discharged to a skilled nursing facility in stable condition.  #2 syncope Patient was admitted with a syncopal episode which was felt to be secondary to problem #1 as her glucose level was 1018 per basic metabolic profile. Cardiac enzymes which was cycled were negative x3. Patient's blood glucose was controlled patient did not have any further syncopal episodes during the hospitalization the discharged in stable and improved condition.  #3 acute fracture of the left first toe. Patient did have x-rays on admission that showed an acute fracture of the proximal phalanx of left first toe which was nondisplaced. She was placed in a hard  shoe and pain was managed. Patient was seen by PT OT. Patient will need to followup with orthopedics as outpatient.  #4 acute on chronic kidney disease stage 3-4 On admission patient was noted to have acute on  chronic kidney disease stage 3-4 with the elevated creatinine of approximately 1.93. Patient's baseline creatinine was about 1.56. It was felt this was secondary to problem #1 in the setting of dehydration. Patient was hydrated with IV fluids with resolution of her acute on chronic kidney disease. Patient's creatinine improved such that by day of discharge was better than her baseline was 1.26. Due to patient's mild acidosis with a bicarbonate of 18 she's been started on sodium bicarbonate tablets 3 times daily. Patient will need a repeat basic metabolic profile done one week post discharge to followup on electrolytes and renal function. Patient was discharged in stable and improved condition.  #5 Left wrist pain Patient was complaining of left wrist pain x-rays which were done were negative. Patient's pain improved and she'll followup as outpatient.  The rest of patient's chronic medical issues remained stable throughout the hospitalization and patient will be discharged in stable and improved condition.  Procedures:  CT head 04/01/2012  Chest x-ray 04/01/2012  X-ray of the left foot 04/01/2012  X-ray of the right foot 04/01/2012  X-ray of the right humerus 04/03/2012  X-ray of the right shoulder 04/03/2012  X-ray of the left. 04/01/2012  Consultations:  Physical therapy  Discharge Exam: Filed Vitals:   04/03/12 0625 04/03/12 1505 04/03/12 2148 04/04/12 0634  BP: 140/51 157/77 155/74 150/75  Pulse: 67 64 70 70  Temp: 98.2 F (36.8 C) 97.5 F (36.4 C) 97.7 F (36.5 C) 98 F (36.7 C)  TempSrc: Oral Oral Oral Oral  Resp: 18 18 18 18   Height:      Weight:      SpO2: 98% 92% 97% 97%    General: NAD Cardiovascular: RRR Respiratory: Soft, nontender, nondistended, positive bowel sounds.  Discharge Instructions  Discharge Orders    Future Orders Please Complete By Expires   Diet Carb Modified      Increase activity slowly      Discharge instructions      Comments:    Follow up with MD at SNF Follow up with orthopedics in 2 weeks       Medication List     As of 04/04/2012 10:46 AM    TAKE these medications         amLODipine 10 MG tablet   Commonly known as: NORVASC   TAKE 1 TABLET BY MOUTH DAILY      aspirin 81 MG tablet   Take 1 tablet (81 mg total) by mouth daily.      atorvastatin 20 MG tablet   Commonly known as: LIPITOR   TAKE 1 TABLET BY MOUTH DAILY      clopidogrel 75 MG tablet   Commonly known as: PLAVIX   TAKE 1 TABLET BY MOUTH DAILY      donepezil 5 MG tablet   Commonly known as: ARICEPT   TAKE 1 TABLET BY MOUTH DAILY EVERY NIGHT AT BEDTIME      famotidine 20 MG tablet   Commonly known as: PEPCID   TAKE 1 TABLET BY MOUTH EVERY NIGHT AT BEDTIME      HYDROcodone-acetaminophen 5-325 MG per tablet   Commonly known as: NORCO/VICODIN   Take 1-2 tablets by mouth every 4 (four) hours as needed.      insulin aspart 100 UNIT/ML  injection   Commonly known as: novoLOG   Inject 3 Units into the skin 3 (three) times daily with meals.      insulin NPH 100 UNIT/ML injection   Commonly known as: HUMULIN N,NOVOLIN N   Inject 10 Units into the skin daily before breakfast.      insulin NPH 100 UNIT/ML injection   Commonly known as: HUMULIN N,NOVOLIN N   Inject 15 Units into the skin at bedtime.      isosorbide mononitrate 60 MG 24 hr tablet   Commonly known as: IMDUR   Take 1 tablet (60 mg total) by mouth daily.      nitroGLYCERIN 0.4 MG SL tablet   Commonly known as: NITROSTAT   Place 1 tablet (0.4 mg total) under the tongue every 5 (five) minutes x 3 doses as needed for chest pain.      PARoxetine 10 MG tablet   Commonly known as: PAXIL   TAKE 1 TABLET BY MOUTH AT BEDTIME      polyethylene glycol packet   Commonly known as: MIRALAX / GLYCOLAX   Take 17 g by mouth daily as needed.      sodium bicarbonate 650 MG tablet   Take 1 tablet (650 mg total) by mouth 3 (three) times daily.           Follow-up Information     Follow up with f/u with MD at SNF.      Follow up with pt will need referal to f/u with orthopedics in 2 weeks for toe fracture. Schedule an appointment as soon as possible for a visit in 2 weeks.          The results of significant diagnostics from this hospitalization (including imaging, microbiology, ancillary and laboratory) are listed below for reference.    Significant Diagnostic Studies: Dg Shoulder Right  04/03/2012  *RADIOLOGY REPORT*  Clinical Data: Fall  RIGHT SHOULDER - 2+ VIEW  Comparison: None.  Findings: Limited range of motion in the glenohumeral joint.  No obvious fracture or dislocation.  Unremarkable soft tissues.  IMPRESSION: No acute bony pathology.   Original Report Authenticated By: Donavan Burnet, M.D.    Dg Wrist Complete Left  04/01/2012  *RADIOLOGY REPORT*  Clinical Data: History of fall complaining of wrist pain.  LEFT WRIST - COMPLETE 3+ VIEW  Comparison: No priors.  Findings: Bones are osteopenic.  No definite acute displaced fractures are identified.  There are degenerative changes of osteoarthritis throughout the wrist with multifocal areas of joint space narrowing, subchondral sclerosis and subchondral cyst formation, including some subchondral cysts on either side of the scapholunate joint.  The bones are osteopenic.  IMPRESSION: 1.  No acute radiographic abnormality of the left wrist. 2.  Mild degenerative changes of osteoarthritis, as above. 3.  Osteopenia.   Original Report Authenticated By: Florencia Reasons, M.D.    Ct Head Wo Contrast  04/01/2012  *RADIOLOGY REPORT*  Clinical Data: The patient found on floor.  Blood on the right eyebrow.  Altered mental status.  CT HEAD WITHOUT CONTRAST  Technique:  Contiguous axial images were obtained from the base of the skull through the vertex without contrast.  Comparison: 07/11/2011  Findings: The cerebral atrophy.  Ventricular dilatation consistent with central atrophy.  Low attenuation changes in the deep white matter  consistent with small vessel ischemia.  No mass effect or midline shift.  No abnormal extra-axial fluid collections.  Wallace Cullens- white matter junctions are distinct.  Basal days.  No evidence of  acute intracranial hemorrhage.  No depressed skull fractures. Visualized paranasal sinuses and mastoid air cells are not opacified.  No significant change since previous study.  Vascular calcifications.  IMPRESSION: No acute intracranial abnormalities.  Chronic-appearing atrophy and small vessel ischemic change.   Original Report Authenticated By: Marlon Pel, M.D.    Dg Chest Portable 1 View  04/01/2012  *RADIOLOGY REPORT*  Clinical Data: Altered mental status.  Fall.  PORTABLE CHEST - 1 VIEW  Comparison: 07/10/2011.  Findings: Shallow inspiration.  Mild cardiac enlargement with mild pulmonary vascular congestion.  Interstitial changes could represent fibrosis or edema.  No focal airspace consolidation.  No blunting of costophrenic angles.  No pneumothorax.  Mediastinal contours appear intact.  IMPRESSION: Cardiac enlargement with mild pulmonary vascular congestion and interstitial edema versus fibrosis.   Original Report Authenticated By: Marlon Pel, M.D.    Dg Humerus Right  04/04/2012  **ADDENDUM** CREATED: 04/04/2012 10:19:32  This should read the following  Findings:  No acute fracture and no dislocation.  **END ADDENDUM** SIGNED BY: Marlowe Aschoff. Hoss, M.D.   04/03/2012  *RADIOLOGY REPORT*  Clinical Data: Fall  RIGHT HUMERUS - 2+ VIEW  Comparison: None.  Findings: Acute fracture and no dislocation.  IMPRESSION: No acute bony pathology.   Original Report Authenticated By: Donavan Burnet, M.D.    Dg Foot Complete Left  04/01/2012  *RADIOLOGY REPORT*  Clinical Data: Pain after fall.  LEFT FOOT - COMPLETE 3+ VIEW  Comparison: None.  Findings: Diffuse bone demineralization.  Acute nondisplaced fracture of the mid shaft proximal phalanx of the left first toe. Degenerative changes in the first metatarsal  phalangeal and multiple interphalangeal joints.  Erosion of the distal aspect of the proximal phalanx of the fifth toe.  Diffusely increased density in the soft tissues may be relative to the bone demineralization or could represent soft tissue hematomas.  IMPRESSION: Acute fracture of the proximal phalanx of the left first toe. Diffuse bone demineralization and degenerative changes.  Erosion of the distal aspect of the proximal phalanx of the fifth toe.   Original Report Authenticated By: Marlon Pel, M.D.    Dg Foot Complete Right  04/01/2012  *RADIOLOGY REPORT*  Clinical Data: Pain after fall.  RIGHT FOOT COMPLETE - 3+ VIEW  Comparison: None.  Findings: Diffuse bone demineralization.  Degenerative changes in the first metatarsophalangeal and interphalangeal joints.  Erosion of the distal aspect of the proximal phalanx of the right fifth toe.  Degenerative changes in the intertarsal and tarsometatarsal joints.  Vascular calcifications.  No acute fractures or subluxations.  IMPRESSION: Diffuse bone demineralization and degenerative changes.  Erosion of the distal aspect of the proximal phalanx of the fifth toe.  No acute fractures.   Original Report Authenticated By: Marlon Pel, M.D.     Microbiology: Recent Results (from the past 240 hour(s))  MRSA PCR SCREENING     Status: Normal   Collection Time   04/01/12  9:59 AM      Component Value Range Status Comment   MRSA by PCR NEGATIVE  NEGATIVE Final      Labs: Basic Metabolic Panel:  Lab 04/04/12 0981 04/03/12 1914 04/02/12 0902 04/02/12 0329 04/01/12 0442 04/01/12 0410  NA 136 133* 126* 129* 126* --  K 3.6 3.7 3.8 3.7 5.3* --  CL 108 105 99 101 97 --  CO2 18* 18* 17* 17* -- 19  GLUCOSE 207* 247* 429* 278* >700* --  BUN 14 15 20 23  32* --  CREATININE 1.26*  1.22* 1.42* 1.58* 2.10* --  CALCIUM 8.3* 8.2* 7.9* 7.9* -- 8.8  MG -- -- -- -- -- --  PHOS -- -- -- -- -- --   Liver Function Tests:  Lab 04/01/12 0410  AST 19  ALT 12   ALKPHOS 307*  BILITOT 0.3  PROT 7.7  ALBUMIN 3.6   No results found for this basename: LIPASE:5,AMYLASE:5 in the last 168 hours No results found for this basename: AMMONIA:5 in the last 168 hours CBC:  Lab 04/02/12 0329 04/01/12 0442 04/01/12 0410  WBC 8.0 -- 9.4  NEUTROABS -- -- --  HGB 8.6* 11.9* 10.7*  HCT 25.3* 35.0* 33.8*  MCV 85.5 -- 89.4  PLT 203 -- 259   Cardiac Enzymes:  Lab 04/01/12 1258 04/01/12 0410  CKTOTAL -- 95  CKMB -- 3.1  CKMBINDEX -- --  TROPONINI <0.30 <0.30   BNP: BNP (last 3 results) No results found for this basename: PROBNP:3 in the last 8760 hours CBG:  Lab 04/04/12 0744 04/03/12 2146 04/03/12 1709 04/03/12 1200 04/03/12 0752  GLUCAP 219* 309* 248* 258* 235*    Time coordinating discharge: 60 minutes  Signed:  Ketzia Guzek  [facility] 04/04/2012, 10:46 AM

## 2012-04-04 NOTE — Progress Notes (Signed)
Patient is set to discharge to [facility] SN[facility] today. Family at bedside & aware, daughter completed admission paperwork at facility earlier today. [facility] called to schedule 2:15 pickup.   Clinical Social Work Department CLINICAL SOCIAL WORK PLACEMENT NOTE 04/04/2012  Patient:  [name], [name]  Account Number:  [id number] Admit date:  04/01/2012  Clinical Social Worker:  Orpah Greek  Date/time:  04/03/2012 09:21 AM  Clinical Social Work is seeking post-discharge placement for this patient at the following level of care:   SKILLED NURSING   (*CSW will update this form in Epic as items are completed)   04/03/2012  Patient/family provided with [facility] Department of Clinical Social Work's list of facilities offering this level of care within the geographic area requested by the patient (or if unable, by the patient's family).  04/03/2012  Patient/family informed of their freedom to choose among providers that offer the needed level of care, that participate in Medicare, Medicaid or managed care program needed by the patient, have an available bed and are willing to accept the patient.  04/03/2012  Patient/family informed of [facility]' ownership interest in [facility], as well as of the fact that they are under no obligation to receive care at this facility.  PASARR submitted to EDS on 04/03/2012 PASARR number received from EDS on 04/03/2012  FL2 transmitted to all facilities in geographic area requested by pt/family on  04/03/2012 FL2 transmitted to all facilities within larger geographic area on   Patient informed that his/her managed care company has contracts with or will negotiate with  certain facilities, including the following:     Patient/family informed of bed offers received:  04/03/2012 Patient chooses bed at [facility], [place] Physician recommends and patient chooses bed at    Patient to be transferred to [facility], [place] on  04/04/2012 Patient to be transferred to facility by [facility]  The following physician request were entered in Epic:   Additional Comments:    Unice Bailey, LCSW [facility] Clinical Social Worker cell #: [phone]

## 2012-04-04 NOTE — Progress Notes (Signed)
OT Note Order received, chart reviewed. Pt is discharging to snf today. Will sign off and defer OT eval to that venue.  Garrel Ridgel, OTR/L  Pager [phone] 04/04/2012

## 2012-04-06 DIAGNOSIS — K5900 Constipation, unspecified: Secondary | ICD-10-CM | POA: Diagnosis not present

## 2012-04-06 DIAGNOSIS — M6281 Muscle weakness (generalized): Secondary | ICD-10-CM | POA: Diagnosis not present

## 2012-04-06 DIAGNOSIS — I2510 Atherosclerotic heart disease of native coronary artery without angina pectoris: Secondary | ICD-10-CM | POA: Diagnosis not present

## 2012-04-06 DIAGNOSIS — S92909A Unspecified fracture of unspecified foot, initial encounter for closed fracture: Secondary | ICD-10-CM | POA: Diagnosis not present

## 2012-04-06 DIAGNOSIS — E1300 Other specified diabetes mellitus with hyperosmolarity without nonketotic hyperglycemic-hyperosmolar coma (NKHHC): Secondary | ICD-10-CM | POA: Diagnosis not present

## 2012-04-11 DIAGNOSIS — D649 Anemia, unspecified: Secondary | ICD-10-CM | POA: Diagnosis not present

## 2012-04-11 DIAGNOSIS — I10 Essential (primary) hypertension: Secondary | ICD-10-CM | POA: Diagnosis not present

## 2012-04-11 DIAGNOSIS — E1165 Type 2 diabetes mellitus with hyperglycemia: Secondary | ICD-10-CM | POA: Diagnosis not present

## 2012-04-11 DIAGNOSIS — E785 Hyperlipidemia, unspecified: Secondary | ICD-10-CM | POA: Diagnosis not present

## 2012-04-11 DIAGNOSIS — I2510 Atherosclerotic heart disease of native coronary artery without angina pectoris: Secondary | ICD-10-CM | POA: Diagnosis not present

## 2012-04-17 ENCOUNTER — Telehealth: Payer: Self-pay | Admitting: General Practice

## 2012-04-17 NOTE — Telephone Encounter (Signed)
Received Fax about Medication Therapy Management program. Placed in basket on 9/24. Please advise.

## 2012-04-18 NOTE — Telephone Encounter (Signed)
Information reviewed, no changes recommended

## 2012-05-02 DIAGNOSIS — I2510 Atherosclerotic heart disease of native coronary artery without angina pectoris: Secondary | ICD-10-CM | POA: Diagnosis not present

## 2012-05-02 DIAGNOSIS — R262 Difficulty in walking, not elsewhere classified: Secondary | ICD-10-CM | POA: Diagnosis not present

## 2012-05-02 DIAGNOSIS — E119 Type 2 diabetes mellitus without complications: Secondary | ICD-10-CM | POA: Diagnosis not present

## 2012-05-02 DIAGNOSIS — IMO0001 Reserved for inherently not codable concepts without codable children: Secondary | ICD-10-CM | POA: Diagnosis not present

## 2012-05-02 DIAGNOSIS — M6281 Muscle weakness (generalized): Secondary | ICD-10-CM | POA: Diagnosis not present

## 2012-05-03 DIAGNOSIS — R262 Difficulty in walking, not elsewhere classified: Secondary | ICD-10-CM | POA: Diagnosis not present

## 2012-05-03 DIAGNOSIS — E119 Type 2 diabetes mellitus without complications: Secondary | ICD-10-CM | POA: Diagnosis not present

## 2012-05-03 DIAGNOSIS — I2510 Atherosclerotic heart disease of native coronary artery without angina pectoris: Secondary | ICD-10-CM | POA: Diagnosis not present

## 2012-05-03 DIAGNOSIS — IMO0001 Reserved for inherently not codable concepts without codable children: Secondary | ICD-10-CM | POA: Diagnosis not present

## 2012-05-03 DIAGNOSIS — M6281 Muscle weakness (generalized): Secondary | ICD-10-CM | POA: Diagnosis not present

## 2012-05-04 ENCOUNTER — Telehealth: Payer: Self-pay | Admitting: *Deleted

## 2012-05-04 NOTE — Telephone Encounter (Signed)
Ok thanks 

## 2012-05-04 NOTE — Telephone Encounter (Signed)
[facility] OT called to inform MD pt was seen for OT eval yesterday and he will see pt for 1 additional visit for sub transfer training.

## 2012-05-08 DIAGNOSIS — M6281 Muscle weakness (generalized): Secondary | ICD-10-CM | POA: Diagnosis not present

## 2012-05-08 DIAGNOSIS — IMO0001 Reserved for inherently not codable concepts without codable children: Secondary | ICD-10-CM | POA: Diagnosis not present

## 2012-05-08 DIAGNOSIS — E119 Type 2 diabetes mellitus without complications: Secondary | ICD-10-CM | POA: Diagnosis not present

## 2012-05-08 DIAGNOSIS — R262 Difficulty in walking, not elsewhere classified: Secondary | ICD-10-CM | POA: Diagnosis not present

## 2012-05-08 DIAGNOSIS — I2510 Atherosclerotic heart disease of native coronary artery without angina pectoris: Secondary | ICD-10-CM | POA: Diagnosis not present

## 2012-05-09 DIAGNOSIS — IMO0001 Reserved for inherently not codable concepts without codable children: Secondary | ICD-10-CM | POA: Diagnosis not present

## 2012-05-09 DIAGNOSIS — I2510 Atherosclerotic heart disease of native coronary artery without angina pectoris: Secondary | ICD-10-CM | POA: Diagnosis not present

## 2012-05-09 DIAGNOSIS — R262 Difficulty in walking, not elsewhere classified: Secondary | ICD-10-CM | POA: Diagnosis not present

## 2012-05-09 DIAGNOSIS — E119 Type 2 diabetes mellitus without complications: Secondary | ICD-10-CM | POA: Diagnosis not present

## 2012-05-09 DIAGNOSIS — M6281 Muscle weakness (generalized): Secondary | ICD-10-CM | POA: Diagnosis not present

## 2012-05-10 DIAGNOSIS — R262 Difficulty in walking, not elsewhere classified: Secondary | ICD-10-CM | POA: Diagnosis not present

## 2012-05-10 DIAGNOSIS — IMO0001 Reserved for inherently not codable concepts without codable children: Secondary | ICD-10-CM | POA: Diagnosis not present

## 2012-05-10 DIAGNOSIS — E119 Type 2 diabetes mellitus without complications: Secondary | ICD-10-CM | POA: Diagnosis not present

## 2012-05-10 DIAGNOSIS — I2510 Atherosclerotic heart disease of native coronary artery without angina pectoris: Secondary | ICD-10-CM | POA: Diagnosis not present

## 2012-05-10 DIAGNOSIS — M6281 Muscle weakness (generalized): Secondary | ICD-10-CM | POA: Diagnosis not present

## 2012-05-11 DIAGNOSIS — E119 Type 2 diabetes mellitus without complications: Secondary | ICD-10-CM | POA: Diagnosis not present

## 2012-05-11 DIAGNOSIS — M6281 Muscle weakness (generalized): Secondary | ICD-10-CM | POA: Diagnosis not present

## 2012-05-11 DIAGNOSIS — R262 Difficulty in walking, not elsewhere classified: Secondary | ICD-10-CM | POA: Diagnosis not present

## 2012-05-11 DIAGNOSIS — I2510 Atherosclerotic heart disease of native coronary artery without angina pectoris: Secondary | ICD-10-CM | POA: Diagnosis not present

## 2012-05-11 DIAGNOSIS — IMO0001 Reserved for inherently not codable concepts without codable children: Secondary | ICD-10-CM | POA: Diagnosis not present

## 2012-05-15 DIAGNOSIS — IMO0001 Reserved for inherently not codable concepts without codable children: Secondary | ICD-10-CM

## 2012-05-15 DIAGNOSIS — R262 Difficulty in walking, not elsewhere classified: Secondary | ICD-10-CM | POA: Diagnosis not present

## 2012-05-15 DIAGNOSIS — M6281 Muscle weakness (generalized): Secondary | ICD-10-CM

## 2012-05-15 DIAGNOSIS — E119 Type 2 diabetes mellitus without complications: Secondary | ICD-10-CM | POA: Diagnosis not present

## 2012-05-15 DIAGNOSIS — I2510 Atherosclerotic heart disease of native coronary artery without angina pectoris: Secondary | ICD-10-CM | POA: Diagnosis not present

## 2012-05-17 DIAGNOSIS — M6281 Muscle weakness (generalized): Secondary | ICD-10-CM | POA: Diagnosis not present

## 2012-05-17 DIAGNOSIS — R262 Difficulty in walking, not elsewhere classified: Secondary | ICD-10-CM | POA: Diagnosis not present

## 2012-05-17 DIAGNOSIS — I2510 Atherosclerotic heart disease of native coronary artery without angina pectoris: Secondary | ICD-10-CM | POA: Diagnosis not present

## 2012-05-17 DIAGNOSIS — E119 Type 2 diabetes mellitus without complications: Secondary | ICD-10-CM | POA: Diagnosis not present

## 2012-05-17 DIAGNOSIS — IMO0001 Reserved for inherently not codable concepts without codable children: Secondary | ICD-10-CM | POA: Diagnosis not present

## 2012-05-18 ENCOUNTER — Ambulatory Visit (INDEPENDENT_AMBULATORY_CARE_PROVIDER_SITE_OTHER): Payer: Medicare Other | Admitting: Internal Medicine

## 2012-05-18 ENCOUNTER — Encounter: Payer: Self-pay | Admitting: Internal Medicine

## 2012-05-18 ENCOUNTER — Other Ambulatory Visit (INDEPENDENT_AMBULATORY_CARE_PROVIDER_SITE_OTHER): Payer: Medicare Other

## 2012-05-18 VITALS — BP 152/72 | HR 76 | Temp 98.5°F | Ht 62.0 in | Wt 165.0 lb

## 2012-05-18 DIAGNOSIS — I10 Essential (primary) hypertension: Secondary | ICD-10-CM | POA: Diagnosis not present

## 2012-05-18 DIAGNOSIS — Z23 Encounter for immunization: Secondary | ICD-10-CM | POA: Diagnosis not present

## 2012-05-18 DIAGNOSIS — E785 Hyperlipidemia, unspecified: Secondary | ICD-10-CM

## 2012-05-18 LAB — BASIC METABOLIC PANEL
CO2: 21 mEq/L (ref 19–32)
Chloride: 109 mEq/L (ref 96–112)
GFR: 28.42 mL/min — ABNORMAL LOW (ref >60.00)
Glucose, Bld: 149 mg/dL — ABNORMAL HIGH (ref 70–99)
Potassium: 5.4 mEq/L — ABNORMAL HIGH (ref 3.5–5.1)
Sodium: 140 mEq/L (ref 135–145)

## 2012-05-18 MED ORDER — CALCIUM CARBONATE-VITAMIN D 600-400 MG-UNIT PO TABS: 1.0000 | ORAL_TABLET | Freq: Two times a day (BID) | ORAL | Status: DC

## 2012-05-18 MED ORDER — INSULIN GLARGINE 100 UNIT/ML ~~LOC~~ SOLN: 24.0000 [IU] | Freq: Every day | SUBCUTANEOUS | Status: DC

## 2012-05-18 MED ORDER — FERROUS SULFATE 325 (65 FE) MG PO TABS: 325.0000 mg | ORAL_TABLET | Freq: Every day | ORAL | Status: DC

## 2012-05-18 NOTE — Assessment & Plan Note (Signed)
Uncontrolled - complicated by renal insufficiency, Compliance complicated by dementia, med confusion and inconsistent caregivers (dtrs: Diane who is more attentive, and Alexis who provides less supervison) Oral meds "stopped" 10/2010 and resumed Lantus after UC eval 10/20/10 - increased dose rx'd 10/2010 Then changed Lantus to NPH hosp 06/2011 and stopped metformin due to ARI Now back on Lantus after SNF stay 03/2012 - reviewed same with pt/dtr today Pt now living back with her dtr diane who helps pt with her meds -  Reviewed importance of med and diet compliance  pt/family to call if sugar over 200 - Lab Results  Component Value Date   HGBA1C 11.7* 11/21/2011

## 2012-05-18 NOTE — Assessment & Plan Note (Signed)
Patient remains on statin medication, changed from Crestor to generic Lipitor Encourage compliance

## 2012-05-18 NOTE — Assessment & Plan Note (Signed)
BP Readings from Last 3 Encounters:  05/18/12 152/72  04/04/12 150/75  12/05/11 152/72   Reviewed importance of medication compliance Daughter Lafonda Mosses agrees to monitor same Will titrate as needed once we're certain the medications prescribed are being taken

## 2012-05-18 NOTE — Progress Notes (Signed)
Subjective:    Patient ID: [name], female    DOB: [birth year], 78 y.o.   MRN: [id number]  HPI  Here for hosp follow up - 2 day stay for HONK and A/CRF - labs, images and notes reviewed -  Also SNF stay for rehab at [facility] - now working [facility] 2x/wk "getting back to normal" since home  Also reviewed chronic medical issues:  uncontrolled DM - history of noncompliance and medication confusion Long hx same - variable cbgs - high >500, never <150; now in 180s per dtr (Diane) - pt does not do as well when supervised dtr Jon Gills Ongoing confusion about medications, esp insulin -  Briefly stopped insulin fall 2011 due to noncompliance but resumed 10/2010 Changed Lantus to NPH and stopped metformin 06/2011 denies hypoglycemia symptoms or events; +PU/PD - no fever or chest pain, no abd pain or n/v/d checks cbgs sporatically at home  CAD - hosp x 2 in Mar 2011 and April 2011 for NQWMI notes and labs from hosp reviewed - no further syncopre, no chest pain and no edema  anemia - chronic fatigue and weakness but no change taking iron as prescribed, no adverse side effects reported Last colo 06/2009 reviewed - negative for cause of anemia  HTN - reports compliance with ongoing medical treatment. denies adverse side effects related to current therapy.   dyslipidemia- rx atorva -reports compliance with ongoing medical treatment; denies adverse side effects related to current therapy.   Past Medical History  Diagnosis Date  . 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 ANEMIA   . DEPRESSION   . HYPERTENSION   . PVD   . INSOMNIA, CHRONIC   . Dementia   . DIABETES MELLITUS, TYPE II   . Hypercholesteremia   . Chronic kidney disease (CKD), stage IV (severe)   . Anemia of chronic disease   . Angina   . Non-Q wave myocardial infarction 10/2009    /E-chart  . Inferior MI 2000    "quiet"  . Breast cancer 2000    right     Review of Systems  Respiratory: Negative for cough, chest  tightness and shortness of breath.   Cardiovascular: Negative for chest pain and palpitations.  Genitourinary: Negative for urgency, frequency and difficulty urinating.  Musculoskeletal: Positive for back pain (improved s/p [facility] summer 2013). Negative for joint swelling.  Neurological: Negative for dizziness, facial asymmetry and headaches.       Objective:   Physical Exam  BP 152/72  Pulse 76  Temp 98.5 F (36.9 C) (Oral)  Ht 5\' 2"  (1.575 m)  Wt 165 lb (74.844 kg)  BMI 30.18 kg/m2  SpO2 98% Wt Readings from Last 3 Encounters:  05/18/12 165 lb (74.844 kg)  04/01/12 173 lb 8 oz (78.7 kg)  12/05/11 172 lb (78.019 kg)   Constitutional: She appears well-developed and well-nourished. No distress. dtr diane at side Neck: Normal range of motion. Neck supple. No JVD present. No thyromegaly present.  Cardiovascular: Normal rate, regular rhythm and normal heart sounds.  No murmur heard. No BLE edema. Pulmonary/Chest: Effort normal and breath sounds normal. No respiratory distress. She has no wheezes.  Neurologic: Awake alert and oriented times self, uncertain as to purpose place or time -requires frequent redirection and assistance with recall.poor short term memory recall (0 of 3 at 3 minutes) -no cranial nerve deficits. Distal extremity is well. Walks with the assistance of rolling walker and has normal gait and balance Psychiatric: She  has a normal mood and affect. Her behavior is normal. Judgment and thought content normal.   Lab Results  Component Value Date   WBC 8.0 04/02/2012   HGB 8.6* 04/02/2012   HCT 25.3* 04/02/2012   PLT 203 04/02/2012   GLUCOSE 207* 04/04/2012   CHOL 112 11/22/2011   TRIG 103 11/22/2011   HDL 30* 11/22/2011   LDLCALC 61 11/22/2011   ALT 12 04/01/2012   AST 19 04/01/2012   NA 136 04/04/2012   K 3.6 04/04/2012   CL 108 04/04/2012   CREATININE 1.26* 04/04/2012   BUN 14 04/04/2012   CO2 18* 04/04/2012   TSH 2.454 11/21/2011   INR 1.06 11/21/2011   HGBA1C 11.7* 11/21/2011         Assessment & Plan:  See problem list. Medications and labs reviewed today.  Time spent with pt/family today 30 minutes, greater than 50% time spent counseling patient on medications for diabetes, 03/2012 hosp for HONK, s/p ESI for tx of back pain 02/2012. Also review of hospital and snf records

## 2012-05-18 NOTE — Assessment & Plan Note (Signed)
Started on bicarbonate for mild acidosis September 2013 hospitalization during hyperosmolar coma Recheck basic metabolic now and titrate dose as needed Also acute renal exacerbation by dehydration during HONK, now resolved Patient to remain off of renal toxic meds such as ACE inhibitors and metformin

## 2012-05-18 NOTE — Patient Instructions (Signed)
It was good to see you today. We have reviewed your hospital records including labs and tests today medications reviewed and updated, increase Lantus to 24 units daily Your prescription(s) have been submitted to your pharmacy. Please take as directed and contact our office if you believe you are having problem(s) with the medication(s). Test(s) ordered today. Your results will be released to MyChart (or called to you) after review, usually within 72hours after test completion. If any changes need to be made, you will be notified at that same time. Continue to monitor your sugars at home and call our office if over 200 for adjustment in insulin dosing Please schedule followup in 3 months for diabetes mellitus check, call sooner if problems.

## 2012-05-21 ENCOUNTER — Telehealth: Payer: Self-pay | Admitting: *Deleted

## 2012-05-21 DIAGNOSIS — E1165 Type 2 diabetes mellitus with hyperglycemia: Secondary | ICD-10-CM

## 2012-05-21 NOTE — Telephone Encounter (Signed)
Thanks - refer done

## 2012-05-21 NOTE — Telephone Encounter (Signed)
Inform pt daughter concerning lab results. Daughter states mom never seen kidney md. Will need referral to see someone...Raechel Chute

## 2012-05-22 ENCOUNTER — Telehealth: Payer: Self-pay | Admitting: Internal Medicine

## 2012-05-22 DIAGNOSIS — E119 Type 2 diabetes mellitus without complications: Secondary | ICD-10-CM | POA: Diagnosis not present

## 2012-05-22 DIAGNOSIS — IMO0001 Reserved for inherently not codable concepts without codable children: Secondary | ICD-10-CM | POA: Diagnosis not present

## 2012-05-22 DIAGNOSIS — R262 Difficulty in walking, not elsewhere classified: Secondary | ICD-10-CM | POA: Diagnosis not present

## 2012-05-22 DIAGNOSIS — I2510 Atherosclerotic heart disease of native coronary artery without angina pectoris: Secondary | ICD-10-CM | POA: Diagnosis not present

## 2012-05-22 DIAGNOSIS — M6281 Muscle weakness (generalized): Secondary | ICD-10-CM | POA: Diagnosis not present

## 2012-05-22 NOTE — Telephone Encounter (Signed)
Daughter aware will received call from [facility] once appt has been set-up.Marland Kitchenlmb

## 2012-05-22 NOTE — Telephone Encounter (Signed)
Dr Felicity Coyer , this patient has [place] access the referral for Nephrology can not be done.

## 2012-05-22 NOTE — Telephone Encounter (Signed)
Is medicare not primary?? please check on this Let me know WHO will see her for nephrology referral because she needs kidney specialist- thanks

## 2012-05-24 DIAGNOSIS — I2510 Atherosclerotic heart disease of native coronary artery without angina pectoris: Secondary | ICD-10-CM | POA: Diagnosis not present

## 2012-05-24 DIAGNOSIS — R262 Difficulty in walking, not elsewhere classified: Secondary | ICD-10-CM | POA: Diagnosis not present

## 2012-05-24 DIAGNOSIS — E119 Type 2 diabetes mellitus without complications: Secondary | ICD-10-CM | POA: Diagnosis not present

## 2012-05-24 DIAGNOSIS — IMO0001 Reserved for inherently not codable concepts without codable children: Secondary | ICD-10-CM | POA: Diagnosis not present

## 2012-05-24 DIAGNOSIS — M6281 Muscle weakness (generalized): Secondary | ICD-10-CM | POA: Diagnosis not present

## 2012-05-25 DIAGNOSIS — R262 Difficulty in walking, not elsewhere classified: Secondary | ICD-10-CM | POA: Diagnosis not present

## 2012-05-25 DIAGNOSIS — E119 Type 2 diabetes mellitus without complications: Secondary | ICD-10-CM | POA: Diagnosis not present

## 2012-05-25 DIAGNOSIS — IMO0001 Reserved for inherently not codable concepts without codable children: Secondary | ICD-10-CM | POA: Diagnosis not present

## 2012-05-25 DIAGNOSIS — I2510 Atherosclerotic heart disease of native coronary artery without angina pectoris: Secondary | ICD-10-CM | POA: Diagnosis not present

## 2012-05-25 DIAGNOSIS — M6281 Muscle weakness (generalized): Secondary | ICD-10-CM | POA: Diagnosis not present

## 2012-05-29 DIAGNOSIS — R262 Difficulty in walking, not elsewhere classified: Secondary | ICD-10-CM | POA: Diagnosis not present

## 2012-05-29 DIAGNOSIS — M6281 Muscle weakness (generalized): Secondary | ICD-10-CM | POA: Diagnosis not present

## 2012-05-29 DIAGNOSIS — IMO0001 Reserved for inherently not codable concepts without codable children: Secondary | ICD-10-CM | POA: Diagnosis not present

## 2012-05-29 DIAGNOSIS — I2510 Atherosclerotic heart disease of native coronary artery without angina pectoris: Secondary | ICD-10-CM | POA: Diagnosis not present

## 2012-05-29 DIAGNOSIS — E119 Type 2 diabetes mellitus without complications: Secondary | ICD-10-CM | POA: Diagnosis not present

## 2012-06-08 DIAGNOSIS — I209 Angina pectoris, unspecified: Secondary | ICD-10-CM | POA: Diagnosis not present

## 2012-06-08 DIAGNOSIS — E119 Type 2 diabetes mellitus without complications: Secondary | ICD-10-CM | POA: Diagnosis not present

## 2012-06-08 DIAGNOSIS — I2510 Atherosclerotic heart disease of native coronary artery without angina pectoris: Secondary | ICD-10-CM | POA: Diagnosis not present

## 2012-06-08 DIAGNOSIS — I10 Essential (primary) hypertension: Secondary | ICD-10-CM | POA: Diagnosis not present

## 2012-06-08 DIAGNOSIS — E7800 Pure hypercholesterolemia, unspecified: Secondary | ICD-10-CM | POA: Diagnosis not present

## 2012-06-22 ENCOUNTER — Other Ambulatory Visit: Payer: Self-pay | Admitting: Internal Medicine

## 2012-07-09 DIAGNOSIS — M47817 Spondylosis without myelopathy or radiculopathy, lumbosacral region: Secondary | ICD-10-CM | POA: Diagnosis not present

## 2012-07-09 DIAGNOSIS — M48061 Spinal stenosis, lumbar region without neurogenic claudication: Secondary | ICD-10-CM | POA: Diagnosis not present

## 2012-07-09 DIAGNOSIS — M5137 Other intervertebral disc degeneration, lumbosacral region: Secondary | ICD-10-CM | POA: Diagnosis not present

## 2012-07-11 ENCOUNTER — Telehealth: Payer: Self-pay | Admitting: *Deleted

## 2012-07-11 NOTE — Telephone Encounter (Signed)
Left msg on vm stating fax over order to d/c aspirin prior to getting injection on Friday. Called Jae Dire back have not seen fax. She fax order back place on md desk for signature...Raechel Chute

## 2012-07-11 NOTE — Telephone Encounter (Signed)
Received fax md signed [facility] to [facility]...lmb

## 2012-07-16 ENCOUNTER — Encounter (HOSPITAL_COMMUNITY): Payer: Self-pay | Admitting: *Deleted

## 2012-07-16 ENCOUNTER — Other Ambulatory Visit: Payer: Self-pay | Admitting: Internal Medicine

## 2012-07-16 ENCOUNTER — Emergency Department (HOSPITAL_COMMUNITY)
Admission: EM | Admit: 2012-07-16 | Discharge: 2012-07-16 | Disposition: A | Discharge status: Home / Self Care | Payer: Medicare Other | Attending: Emergency Medicine | Admitting: Emergency Medicine

## 2012-07-16 ENCOUNTER — Emergency Department (HOSPITAL_COMMUNITY): Payer: Medicare Other

## 2012-07-16 DIAGNOSIS — N390 Urinary tract infection, site not specified: Secondary | ICD-10-CM | POA: Diagnosis not present

## 2012-07-16 DIAGNOSIS — G4700 Insomnia, unspecified: Secondary | ICD-10-CM | POA: Diagnosis not present

## 2012-07-16 DIAGNOSIS — I129 Hypertensive chronic kidney disease with stage 1 through stage 4 chronic kidney disease, or unspecified chronic kidney disease: Secondary | ICD-10-CM | POA: Diagnosis not present

## 2012-07-16 DIAGNOSIS — E119 Type 2 diabetes mellitus without complications: Secondary | ICD-10-CM | POA: Insufficient documentation

## 2012-07-16 DIAGNOSIS — Z951 Presence of aortocoronary bypass graft: Secondary | ICD-10-CM | POA: Diagnosis not present

## 2012-07-16 DIAGNOSIS — F0390 Unspecified dementia without behavioral disturbance: Secondary | ICD-10-CM | POA: Diagnosis not present

## 2012-07-16 DIAGNOSIS — Z862 Personal history of diseases of the blood and blood-forming organs and certain disorders involving the immune mechanism: Secondary | ICD-10-CM | POA: Insufficient documentation

## 2012-07-16 DIAGNOSIS — D649 Anemia, unspecified: Secondary | ICD-10-CM | POA: Diagnosis not present

## 2012-07-16 DIAGNOSIS — Z79899 Other long term (current) drug therapy: Secondary | ICD-10-CM | POA: Diagnosis not present

## 2012-07-16 DIAGNOSIS — F329 Major depressive disorder, single episode, unspecified: Secondary | ICD-10-CM | POA: Diagnosis not present

## 2012-07-16 DIAGNOSIS — I2510 Atherosclerotic heart disease of native coronary artery without angina pectoris: Secondary | ICD-10-CM | POA: Insufficient documentation

## 2012-07-16 DIAGNOSIS — N184 Chronic kidney disease, stage 4 (severe): Secondary | ICD-10-CM | POA: Diagnosis not present

## 2012-07-16 DIAGNOSIS — Z9849 Cataract extraction status, unspecified eye: Secondary | ICD-10-CM | POA: Diagnosis not present

## 2012-07-16 DIAGNOSIS — Z794 Long term (current) use of insulin: Secondary | ICD-10-CM | POA: Diagnosis not present

## 2012-07-16 DIAGNOSIS — F3289 Other specified depressive episodes: Secondary | ICD-10-CM | POA: Insufficient documentation

## 2012-07-16 DIAGNOSIS — Z7982 Long term (current) use of aspirin: Secondary | ICD-10-CM | POA: Diagnosis not present

## 2012-07-16 DIAGNOSIS — I739 Peripheral vascular disease, unspecified: Secondary | ICD-10-CM | POA: Insufficient documentation

## 2012-07-16 DIAGNOSIS — Z853 Personal history of malignant neoplasm of breast: Secondary | ICD-10-CM | POA: Diagnosis not present

## 2012-07-16 DIAGNOSIS — Z87891 Personal history of nicotine dependence: Secondary | ICD-10-CM | POA: Insufficient documentation

## 2012-07-16 DIAGNOSIS — E7800 Pure hypercholesterolemia, unspecified: Secondary | ICD-10-CM | POA: Diagnosis not present

## 2012-07-16 DIAGNOSIS — I252 Old myocardial infarction: Secondary | ICD-10-CM | POA: Insufficient documentation

## 2012-07-16 DIAGNOSIS — Z8679 Personal history of other diseases of the circulatory system: Secondary | ICD-10-CM | POA: Diagnosis not present

## 2012-07-16 DIAGNOSIS — R1012 Left upper quadrant pain: Secondary | ICD-10-CM | POA: Diagnosis not present

## 2012-07-16 DIAGNOSIS — I10 Essential (primary) hypertension: Secondary | ICD-10-CM | POA: Diagnosis not present

## 2012-07-16 LAB — BASIC METABOLIC PANEL
CO2: 22 mEq/L (ref 19–32)
Calcium: 8.9 mg/dL (ref 8.4–10.5)
Glucose, Bld: 193 mg/dL — ABNORMAL HIGH (ref 70–99)
Potassium: 4.2 mEq/L (ref 3.5–5.1)
Sodium: 135 mEq/L (ref 135–145)

## 2012-07-16 LAB — URINE MICROSCOPIC-ADD ON

## 2012-07-16 LAB — URINALYSIS, ROUTINE W REFLEX MICROSCOPIC
Bilirubin Urine: NEGATIVE
Glucose, UA: 250 mg/dL — AB
Specific Gravity, Urine: >1.03 — ABNORMAL HIGH (ref 1.005–1.030)
pH: 5.5 (ref 5.0–8.0)

## 2012-07-16 LAB — HEPATIC FUNCTION PANEL
AST: 17 U/L (ref 0–37)
Albumin: 3.4 g/dL — ABNORMAL LOW (ref 3.5–5.2)
Bilirubin, Direct: <0.1 mg/dL (ref 0.0–0.3)

## 2012-07-16 LAB — CBC
Hemoglobin: 10.8 g/dL — ABNORMAL LOW (ref 12.0–15.0)
MCH: 29 pg (ref 26.0–34.0)
MCV: 86.6 fL (ref 78.0–100.0)
RBC: 3.73 MIL/uL — ABNORMAL LOW (ref 3.87–5.11)

## 2012-07-16 LAB — POCT I-STAT TROPONIN I: Troponin i, poc: 0 ng/mL (ref 0.00–0.08)

## 2012-07-16 MED ORDER — ONDANSETRON HCL 4 MG/2ML IJ SOLN
4.0000 mg | Freq: Once | INTRAMUSCULAR | Status: AC
  Administered 2012-07-16: 4 mg via INTRAVENOUS
  Filled 2012-07-16: qty 2

## 2012-07-16 MED ORDER — DEXTROSE 5 % IV SOLN
1.0000 g | Freq: Once | INTRAVENOUS | Status: AC
  Administered 2012-07-16: 1 g via INTRAVENOUS
  Filled 2012-07-16: qty 10

## 2012-07-16 MED ORDER — MORPHINE SULFATE 4 MG/ML IJ SOLN
4.0000 mg | Freq: Once | INTRAMUSCULAR | Status: AC
  Administered 2012-07-16: 4 mg via INTRAVENOUS
  Filled 2012-07-16: qty 1

## 2012-07-16 MED ORDER — CEPHALEXIN 250 MG PO CAPS: 250.0000 mg | ORAL_CAPSULE | Freq: Four times a day (QID) | ORAL | Status: DC

## 2012-07-16 MED ORDER — SODIUM CHLORIDE 0.9 % IV BOLUS (SEPSIS)
1000.0000 mL | Freq: Once | INTRAVENOUS | Status: AC
  Administered 2012-07-16: 1000 mL via INTRAVENOUS

## 2012-07-16 NOTE — ED Notes (Signed)
Pt is here with chest pain to left chest that started yesterday.  Pt denies sob.   No nausea or weakness.  Pt has some tenderness and swelling to LUQ area.

## 2012-07-16 NOTE — ED Provider Notes (Signed)
History     CSN: [id number]  Arrival date & time 07/16/12  1051   First MD Initiated Contact with Patient 07/16/12 1254      Chief Complaint  Patient presents with  . Chest Pain    (Consider location/radiation/quality/duration/timing/severity/associated sxs/prior treatment) HPI Pt presents with c/o pain in left upper abdomen and epigastric area.  She states pain is intermittent.  She has had decreased appetite.  No nausea/vomiting.  No fever/chills.  Last BM yesterday and was normal.  Denies dysuria.  Although she does describe urinary urgency.  Per triage note nurse states she c/o chest pain, however when I specifically ask her about chest pain she specifically denies this.  There are no other associated systemic symptoms, there are no other alleviating or modifying factors.   Past Medical History  Diagnosis Date  . 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 ANEMIA   . DEPRESSION   . HYPERTENSION   . PVD   . INSOMNIA, CHRONIC   . Dementia   . DIABETES MELLITUS, TYPE II   . Hypercholesteremia   . Chronic kidney disease (CKD), stage IV (severe)   . Anemia of chronic disease   . Angina   . Non-Q wave myocardial infarction 10/2009    /E-chart  . Inferior MI 2000    "quiet"  . Breast cancer 2000    right    Past Surgical History  Procedure Date  . Capsulectomy 12/27/2010    L eye  . Breast lumpectomy 2000    right  . Cataract extraction w/ intraocular lens  implant, bilateral   . Coronary artery bypass graft 2000    CABG X1  . Coronary angioplasty with stent placement     "twice"  . Eye surgery     Cataracts (R) eye 06/1999 & (L) eye 09/1999- [place]  . Combined hysteroscopy diagnostic / d&c 07/2004    /e-chart  . Dilation and curettage of uterus     Family History  Problem Relation Age of Onset  . Coronary artery disease Father     History  Substance Use Topics  . Smoking status: Former Smoker -- 1.0 packs/day for 20 years    Types: Cigarettes  .  Smokeless tobacco: Never Used     Comment: 11/21/11 "quit smoking 30-40 years ago  . Alcohol Use: Yes     Comment: 11/21/11 "used to drink quit alot; last drink of alcohol maybe 30 years ago"    OB History    Grav Para Term Preterm Abortions TAB SAB Ect Mult Living                  Review of Systems ROS reviewed and all otherwise negative except for mentioned in HPI  Allergies  Review of patient's allergies indicates no known allergies.  Home Medications   Current Outpatient Rx  Name  Route  Sig  Dispense  Refill  . AMLODIPINE BESYLATE 10 MG PO TABS      TAKE 1 TABLET BY MOUTH DAILY   30 tablet   5   . ASPIRIN 81 MG PO TABS   Oral   Take 1 tablet (81 mg total) by mouth daily.   30 tablet   3   . AMLODIPINE BESYLATE 10 MG PO TABS      TAKE 1 TABLET BY MOUTH DAILY   30 tablet   5   . ATORVASTATIN CALCIUM 20 MG PO TABS      TAKE 1  TABLET BY MOUTH DAILY   30 tablet   5   . ATORVASTATIN CALCIUM 20 MG PO TABS      TAKE 1 TABLET BY MOUTH DAILY   30 tablet   5   . BD INSULIN SYRINGE 30G X 1/2" 0.5 ML MISC      Use as directed         . CALCIUM CARBONATE-VITAMIN D 600-400 MG-UNIT PO TABS   Oral   Take 1 tablet by mouth 2 (two) times daily.         . CEPHALEXIN 250 MG PO CAPS   Oral   Take 1 capsule (250 mg total) by mouth 4 (four) times daily.   28 capsule   0   . CLOPIDOGREL BISULFATE 75 MG PO TABS      TAKE 1 TABLET BY MOUTH DAILY   30 tablet   5   . CLOPIDOGREL BISULFATE 75 MG PO TABS      TAKE 1 TABLET BY MOUTH DAILY   30 tablet   5   . DONEPEZIL HCL 5 MG PO TABS      TAKE 1 TABLET BY MOUTH DAILY EVERY NIGHT AT BEDTIME   90 tablet   3   . FAMOTIDINE 20 MG PO TABS      TAKE 1 TABLET BY MOUTH EVERY NIGHT AT BEDTIME   30 tablet   5   . FERROUS SULFATE 325 (65 FE) MG PO TABS   Oral   Take 1 tablet (325 mg total) by mouth daily with breakfast.   30 tablet   3   . INSULIN ASPART 100 UNIT/ML [place] SOLN   Subcutaneous   Inject 3  Units into the skin 3 (three) times daily with meals.   1 vial   0   . INSULIN GLARGINE 100 UNIT/ML [place] SOLN   Subcutaneous   Inject 24 Units into the skin at bedtime.   5 pen   PRN   . ISOSORBIDE MONONITRATE ER 60 MG PO TB24   Oral   Take 1 tablet (60 mg total) by mouth daily.   30 tablet   3   . NITROGLYCERIN 0.4 MG SL SUBL   Sublingual   Place 1 tablet (0.4 mg total) under the tongue every 5 (five) minutes x 3 doses as needed for chest pain.   25 tablet   3   . PAROXETINE HCL 10 MG PO TABS      TAKE 1 TABLET BY MOUTH AT BEDTIME   30 tablet   5   . SODIUM BICARBONATE 650 MG PO TABS   Oral   Take 1 tablet (650 mg total) by mouth 2 (two) times daily.   60 tablet   0     BP 188/70  Pulse 67  Temp 98.4 F (36.9 C) (Oral)  Resp 16  SpO2 100% Vitals reviewed Physical Exam Physical Examination: General appearance - alert, well appearing, and in no distress Mental status - alert, oriented to person, place, and time Eyes - no conjunctival injection, no scleral icterus Mouth - mucous membranes moist, pharynx normal without lesions Chest - clear to auscultation, no wheezes, rales or rhonchi, symmetric air entry Heart - normal rate, regular rhythm, normal S1, S2, no murmurs, rubs, clicks or gallops Abdomen - soft, ttp in epigastric and left upper quadrant- tenderness is mild, no gaurding or rebound tenderness, nondistended, no masses or organomegaly, nabs Extremities - peripheral pulses normal, no pedal edema, no clubbing or cyanosis Skin -  normal coloration and turgor, no rashes  ED Course  Procedures (including critical care time)   Date: 07/16/2012  Rate: 68  Rhythm: normal sinus rhythm  QRS Axis: normal  Intervals: normal  ST/T Wave abnormalities: normal  Conduction Disutrbances:none  Narrative Interpretation:   Old EKG Reviewed: none available     Labs Reviewed  BASIC METABOLIC PANEL - Abnormal; Notable for the following:    Glucose, Bld 193 (*)      Creatinine, Ser 1.56 (*)     GFR calc non Af Amer 30 (*)     GFR calc Af Amer 35 (*)     All other components within normal limits  CBC - Abnormal; Notable for the following:    RBC 3.73 (*)     Hemoglobin 10.8 (*)     HCT 32.3 (*)     All other components within normal limits  URINALYSIS, ROUTINE W REFLEX MICROSCOPIC - Abnormal; Notable for the following:    Specific Gravity, Urine >1.030 (*)     Glucose, UA 250 (*)     Hgb urine dipstick TRACE (*)     Protein, ur 30 (*)     Nitrite POSITIVE (*)     Leukocytes, UA MODERATE (*)     All other components within normal limits  URINE MICROSCOPIC-ADD ON - Abnormal; Notable for the following:    Squamous Epithelial / LPF FEW (*)     Bacteria, UA MANY (*)     All other components within normal limits  HEPATIC FUNCTION PANEL - Abnormal; Notable for the following:    Albumin 3.4 (*)     Alkaline Phosphatase 256 (*)     Total Bilirubin 0.2 (*)     All other components within normal limits  LIPASE, BLOOD  POCT I-STAT TROPONIN I  POCT I-STAT TROPONIN I  URINE CULTURE   Ct Abdomen Pelvis Wo Contrast  07/16/2012  *RADIOLOGY REPORT*  Clinical Data: Left upper quadrant abdominal pain, tenderness, and swelling.  CT ABDOMEN AND PELVIS WITHOUT CONTRAST  Technique:  Multidetector CT imaging of the abdomen and pelvis was performed following the standard protocol without intravenous contrast.  Comparison: Multiple exams, including 06/17/2011  Findings: Right lower anterior rib fractures appear to be undergoing healing response.  The liver has a slightly nodular contour, most appreciable along the gallbladder fossa peripherally.  Calcifications of the splenic artery noted.  Linear and punctate calcifications along the renal hila favor vascular calcifications.  Dense aortoiliac atherosclerotic calcification noted.  Small retroperitoneal lymph nodes are not pathologically enlarged by size criteria.  The left pelvic sidewall lymph node is in the upper normal  range.  Trace fluid noted in the cul-de-sac. The uterus and adnexa appear unremarkable.  No dilated bowel identified.  Appendix unremarkable.  Prominent degenerative arthropathy of the hips noted.  A cause for the patient's left upper quadrant abdominal pain and swelling is not observed.  IMPRESSION:  1.  Slightly nodular contour of the liver, query early cirrhosis. 2.  Atherosclerosis. 3.  Degenerative arthropathy of the hips. 4.  Right lower anterior rib fractures are probably old based on healing response.   Original Report Authenticated By: Gaylyn Rong, M.D.      1. Urinary tract infection       MDM  Pt presenting with c/o abdominal pain- she has findings of UTI on urinalysis.  EKG and other labs are reassuring (alk phos chronically elevated).  Pt given rocephin in the ED, discharged with rx for keflex.  Discharged  with strict return precautions.  Pt agreeable with plan.        Ethelda Chick, MD 07/17/12 [phone]

## 2012-07-18 LAB — URINE CULTURE

## 2012-07-19 NOTE — ED Notes (Signed)
+   Urine Patient treated with Keflex-sensitive to same-chart appended per protocol MD. 

## 2012-07-20 ENCOUNTER — Other Ambulatory Visit: Payer: Self-pay | Admitting: Internal Medicine

## 2012-08-22 ENCOUNTER — Ambulatory Visit: Payer: Medicare Other | Admitting: Internal Medicine

## 2012-08-24 ENCOUNTER — Ambulatory Visit (INDEPENDENT_AMBULATORY_CARE_PROVIDER_SITE_OTHER): Payer: Medicare Other | Admitting: Internal Medicine

## 2012-08-24 ENCOUNTER — Other Ambulatory Visit (INDEPENDENT_AMBULATORY_CARE_PROVIDER_SITE_OTHER): Payer: Medicare Other

## 2012-08-24 ENCOUNTER — Encounter: Payer: Self-pay | Admitting: Internal Medicine

## 2012-08-24 VITALS — BP 138/76 | HR 73 | Temp 97.7°F | Resp 10 | Wt 165.4 lb

## 2012-08-24 DIAGNOSIS — R300 Dysuria: Secondary | ICD-10-CM

## 2012-08-24 DIAGNOSIS — E1129 Type 2 diabetes mellitus with other diabetic kidney complication: Secondary | ICD-10-CM

## 2012-08-24 DIAGNOSIS — I10 Essential (primary) hypertension: Secondary | ICD-10-CM

## 2012-08-24 DIAGNOSIS — E1165 Type 2 diabetes mellitus with hyperglycemia: Secondary | ICD-10-CM

## 2012-08-24 DIAGNOSIS — F0390 Unspecified dementia without behavioral disturbance: Secondary | ICD-10-CM | POA: Diagnosis not present

## 2012-08-24 LAB — URINALYSIS, ROUTINE W REFLEX MICROSCOPIC
Hgb urine dipstick: NEGATIVE
Ketones, ur: NEGATIVE
Leukocytes, UA: NEGATIVE
Specific Gravity, Urine: 1.025 (ref 1.000–1.030)
Urobilinogen, UA: 0.2 (ref 0.0–1.0)

## 2012-08-24 LAB — HEMOGLOBIN A1C: Hgb A1c MFr Bld: 14.3 % — ABNORMAL HIGH (ref 4.6–6.5)

## 2012-08-24 MED ORDER — INSULIN ASPART 100 UNIT/ML ~~LOC~~ SOLN: 3.0000 [IU] | Freq: Three times a day (TID) | SUBCUTANEOUS | Status: DC

## 2012-08-24 MED ORDER — DONEPEZIL HCL 10 MG PO TABS: 10.0000 mg | ORAL_TABLET | Freq: Every day | ORAL | Status: DC

## 2012-08-24 MED ORDER — INSULIN GLARGINE 100 UNIT/ML ~~LOC~~ SOLN: 24.0000 [IU] | Freq: Every day | SUBCUTANEOUS | Status: DC

## 2012-08-24 NOTE — Progress Notes (Signed)
  Subjective:    Patient ID: [name], female    DOB: 05/26/1929, 79 y.o.   MRN: [id number]  HPI  Here for follow up - reviewed chronic medical issues: continued issues with compliance   Past Medical History  Diagnosis Date  . 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 ANEMIA   . DEPRESSION   . HYPERTENSION   . PVD   . INSOMNIA, CHRONIC   . Dementia   . DIABETES MELLITUS, TYPE II   . Hypercholesteremia   . Chronic kidney disease (CKD), stage IV (severe)   . Anemia of chronic disease   . Angina   . Non-Q wave myocardial infarction 10/2009    /E-chart  . Inferior MI 2000    "quiet"  . Breast cancer 2000    right     Review of Systems  Constitutional: Negative for fever and unexpected weight change.  Respiratory: Negative for cough, chest tightness and shortness of breath.   Cardiovascular: Negative for chest pain and palpitations.  Genitourinary: Positive for dysuria, urgency and frequency. Negative for flank pain, difficulty urinating and pelvic pain.  Musculoskeletal: Positive for back pain (improved s/p [facility] summer 2013). Negative for joint swelling.  Neurological: Negative for dizziness, facial asymmetry and headaches.       Objective:   Physical Exam  BP 138/76  Pulse 73  Temp 97.7 F (36.5 C) (Oral)  Resp 10  Wt 165 lb 6.4 oz (75.025 kg)  SpO2 97% Wt Readings from Last 3 Encounters:  08/24/12 165 lb 6.4 oz (75.025 kg)  05/18/12 165 lb (74.844 kg)  04/01/12 173 lb 8 oz (78.7 kg)   Constitutional: She appears well-developed and well-nourished. No distress. dtr diane at side Neck: Normal range of motion. Neck supple. No JVD present. No thyromegaly present.  Cardiovascular: Normal rate, regular rhythm and normal heart sounds.  No murmur heard. No BLE edema. Pulmonary/Chest: Effort normal and breath sounds normal. No respiratory distress. She has no wheezes.  Neurologic: Awake alert and oriented times self, uncertain as to purpose place or time -requires  frequent redirection and assistance with recall. poor short term memory recall (0 of 3 at 3 minutes) -no cranial nerve deficits. Walks with the assistance of rolling walker with normal gait and balance Psychiatric: She has a normal mood and affect. Her behavior is normal. Judgment and thought content appropriate but limited insight.   Lab Results  Component Value Date   WBC 7.2 07/16/2012   HGB 10.8* 07/16/2012   HCT 32.3* 07/16/2012   PLT 259 07/16/2012   GLUCOSE 193* 07/16/2012   CHOL 112 11/22/2011   TRIG 103 11/22/2011   HDL 30* 11/22/2011   LDLCALC 61 11/22/2011   ALT 11 07/16/2012   AST 17 07/16/2012   NA 135 07/16/2012   K 4.2 07/16/2012   CL 102 07/16/2012   CREATININE 1.56* 07/16/2012   BUN 16 07/16/2012   CO2 22 07/16/2012   TSH 2.454 11/21/2011   INR 1.06 11/21/2011   HGBA1C 10.5* 05/18/2012   Lab Results  Component Value Date   VITAMINB12 498 07/12/2011       Assessment & Plan:  See problem list. Medications and labs reviewed today.  Dysuria - check UA and tx if +UTI

## 2012-08-24 NOTE — Assessment & Plan Note (Signed)
BP Readings from Last 3 Encounters:  08/24/12 138/76  07/16/12 188/70  05/18/12 152/72   Reviewed importance of medication compliance Daughter Lafonda Mosses agrees to monitor same The current medical regimen is effective;  continue present plan and medications.

## 2012-08-24 NOTE — Patient Instructions (Signed)
It was good to see you today. We have reviewed your prior records including labs and tests today Medications reviewed and updated, refills as discussed. Increase dose Aricept for memory now - Your prescription(s) have been submitted to your pharmacy. Please take as directed and contact our office if you believe you are having problem(s) with the medication(s). Test(s) ordered today. Your results will be released to MyChart (or called to you) after review, usually within 72hours after test completion. If any changes need to be made, you will be notified at that same time. we'll make referral to diabetes specialist for your sugars. Our office will contact you regarding appointment(s) once made. Please schedule followup in 3-4 months, call sooner if problems.

## 2012-08-24 NOTE — Assessment & Plan Note (Signed)
Progressive distractibility and forgetfulness -  Prior labs unremarkable -Normal TSH and b12 MRI brain 08/2009 - meningioma and SVD (reveiwed) Lab Results  Component Value Date   VITAMINB12 498 07/12/2011   rx'd aricept  10/2010 but  Did not start until 01/2011 Titrate up dose now -new erx done

## 2012-08-24 NOTE — Assessment & Plan Note (Signed)
Uncontrolled - complicated by renal insufficiency, Compliance complicated by dementia, med confusion and inconsistent caregivers (dtrs: Diane who is more attentive, and Alexis who provides less supervison) Oral meds "stopped" 10/2010 and resumed Lantus after UC eval 10/20/10 - increased dose rx'd 10/2010 Then changed Lantus to [facility] hosp 06/2011 and stopped metformin due to ARI Now back on Lantus after SNF stay 03/2012 - reviewed same with pt/dtr today Pt now living back with her dtr diane who helps pt with her meds -  Reviewed importance of med and diet compliance  pt/family to call if sugar over 200 - Lab Results  Component Value Date   HGBA1C 10.5* 05/18/2012

## 2012-08-27 ENCOUNTER — Encounter: Payer: Self-pay | Admitting: Internal Medicine

## 2012-08-28 ENCOUNTER — Telehealth: Payer: Self-pay | Admitting: Internal Medicine

## 2012-08-28 DIAGNOSIS — D649 Anemia, unspecified: Secondary | ICD-10-CM | POA: Diagnosis not present

## 2012-08-28 DIAGNOSIS — N2581 Secondary hyperparathyroidism of renal origin: Secondary | ICD-10-CM | POA: Diagnosis not present

## 2012-08-28 NOTE — Telephone Encounter (Signed)
Returned call to daughter to advise MD out of the office this afternoon and that vials of Novolog are available. Per voice prompt, VM mailbox has not been set up yet, unable to leave messg.

## 2012-08-28 NOTE — Telephone Encounter (Signed)
Daughter is calling in states that they have been unable to obtain the new prescription for Novolog for patient due to insurance will not cover medication until 09/12/12.  Daughter states that patient is getting 24 units of Lantus in the morning.  Morning glucose level 08/27/12 was 520.  And on 08/28/12 was 311 prior to breakfast.  Patient is currently asymptomatic per daughter and will be going to appointment with Kidney doctor for evaluation today at 13:00.  Daughter states that she does have a brand new bottle of Humulin N and would like to know if this is something that would be appropriate for the patient until they can afford the Novolog.  Daughter states that because she was unable to get the Novolog she is still only giving the Lantus in the morning.  If this is not correct please inform otherwise.   OFFICE NOTE: PLEASE FOLLOW UP WITH PATIENT.

## 2012-08-29 ENCOUNTER — Other Ambulatory Visit: Payer: Self-pay | Admitting: Nephrology

## 2012-08-31 ENCOUNTER — Encounter: Payer: Self-pay | Admitting: Internal Medicine

## 2012-08-31 ENCOUNTER — Ambulatory Visit (INDEPENDENT_AMBULATORY_CARE_PROVIDER_SITE_OTHER): Payer: Medicare Other | Admitting: Internal Medicine

## 2012-08-31 VITALS — BP 140/62 | HR 75 | Temp 98.2°F | Resp 16 | Ht 63.0 in | Wt 166.0 lb

## 2012-08-31 DIAGNOSIS — E1165 Type 2 diabetes mellitus with hyperglycemia: Secondary | ICD-10-CM

## 2012-08-31 DIAGNOSIS — E1129 Type 2 diabetes mellitus with other diabetic kidney complication: Secondary | ICD-10-CM

## 2012-08-31 NOTE — Progress Notes (Signed)
Subjective:     Patient ID: [name], female   DOB: 12-Apr-1929, 79 y.o.   MRN: [id number]  HPI [name] is an 79 year old woman, referred by PCP, Dr. Felicity Coyer, for management of DM2, insulin-dependent, uncontrolled, with complications (CAD-status post MI in 2000 and 2011, PVD, CKD stage IV).  Patient has had diabetes mellitus type 2 since 1990. She was on oral meds until 2010 but started on insulin (Lantus0  that year. She was on NPH for approximately a year, however now she is back to Lantus after a SNF stay in September 2013. She now lives with one of her daughters, [name].   She is on: - Lantus 24 units every morning - NovoLog 3 units 3 times a day with meals - recently started but she could not pick it up until 02/19.  Her last hemoglobin A1c was 14.3% (08/24/2012), increased from 10.5% (05/18/2012). I reviewed her chart and, per Dr. Diamantina Monks notes, the very uterus to proper treatment are: Decreased compliance due to dementia, mental confusion, and different caregivers (her daughters [name] and [name]).  Patient checks her sugars once time a day and they are: - am: 150-330 (150 is the exception, when was not feeling well and did not have a snack the night before  No lows. She has hypoglycemia awareness. Lowest blood sugar: 70 >> was not feeling well. Highest blood sugar HI - not often.  The patient also has a history of hyperlipidemia, with her latest LDL 61 in 06/2012, anemia of chronic disease, right breast cancer. As mentioned above, she has chronic kidney disease, with her last GFR 30, and the BUN/creatinine ratio of 16/1.56 in 07/16/2012. She also has coronary artery disease status post 2 MIs and PVD.  She sees her eye dr. but not every year. Last visit 2 years ago. She had cataract sx in the past. Does have numbness and tingling in her legs and also burning R>L. She has sores on her feet. She had the flu vaccine this year.   Her son has DM.  Review of Systems Constitutional: +  weight gain 20 lbs in 2-3 years, + fatigue, + subjective +hypothermia. Denies increased thirst and urination. Poor sleep. Nocturia - wears Depends.  Eyes: no blurry vision, no xerophthalmia ENT: no sore throat, no nodules palpated in throat, no dysphagia/odynophagia, no hoarseness Cardiovascular: no CP/SOB/palpitations/+ leg swelling Respiratory: + cough/no SOB Gastrointestinal: no N/V/D/C Musculoskeletal: no muscle/+ joint aches Skin: no rashes, hair loss Neurological: no tremors/numbness/tingling/dizziness Psychiatric: no depression/anxiety  Past Medical History  Diagnosis Date  . 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 ANEMIA   . DEPRESSION   . HYPERTENSION   . PVD   . INSOMNIA, CHRONIC   . Dementia   . DIABETES MELLITUS, TYPE II   . Hypercholesteremia   . Chronic kidney disease (CKD), stage IV (severe)   . Anemia of chronic disease   . Angina   . Non-Q wave myocardial infarction 10/2009    /E-chart  . Inferior MI 2000    "quiet"  . Breast cancer 2000    right    Past Surgical History  Procedure Date  . Capsulectomy 12/27/2010    L eye  . Breast lumpectomy 2000    right  . Cataract extraction w/ intraocular lens  implant, bilateral   . Coronary artery bypass graft 2000    CABG X1  . Coronary angioplasty with stent placement     "twice"  . Eye surgery  Cataracts (R) eye 06/1999 & (L) eye 09/1999- [facility]  . Combined hysteroscopy diagnostic / d&c 07/2004    /e-chart  . Dilation and curettage of uterus     History   Social History  . Marital Status: Widowed    Spouse Name: N/A    Number of Children: 6  . Years of Education: N/A    Social History Main Topics  . Smoking status: Former Smoker -- 1.0 packs/day for 20 years    Types: Cigarettes  . Smokeless tobacco: Never Used     Comment: 11/21/11 "quit smoking 30-40 years ago  . Alcohol Use: Yes     Comment: 11/21/11 "used to drink quit alot; last drink of alcohol maybe 30 years ago"  . Drug Use: No   . Sexually Active: Not Currently   Social History Narrative   Lives alone with her daughter, [name]. Very active in her church chair. Volunteers at [employer] [facility]   Current Outpatient Prescriptions on File Prior to Visit  Medication Sig Dispense Refill  . amLODipine (NORVASC) 10 MG tablet TAKE 1 TABLET BY MOUTH DAILY  30 tablet  5  . aspirin 81 MG tablet Take 1 tablet (81 mg total) by mouth daily.  30 tablet  3  . atorvastatin (LIPITOR) 20 MG tablet TAKE 1 TABLET BY MOUTH DAILY  30 tablet  5  . B-D INS SYRINGE 0.5CC/30GX1/2" 30G X 1/2" 0.5 ML MISC Use as directed      . Calcium Carbonate-Vitamin D (CALCIUM 600-D) 600-400 MG-UNIT per tablet Take 1 tablet by mouth 2 (two) times daily.      . clopidogrel (PLAVIX) 75 MG tablet TAKE 1 TABLET BY MOUTH DAILY  30 tablet  5  . donepezil (ARICEPT) 10 MG tablet Take 1 tablet (10 mg total) by mouth at bedtime.  90 tablet  3  . famotidine (PEPCID) 20 MG tablet TAKE 1 TABLET BY MOUTH EVERY NIGHT AT BEDTIME  30 tablet  PRN  . ferrous sulfate 325 (65 FE) MG tablet Take 1 tablet (325 mg total) by mouth daily with breakfast.  30 tablet  3  . HEALTHWISE SHORT PEN NEEDLES 31G X 8 MM MISC       . insulin aspart (NOVOLOG) 100 UNIT/ML injection Inject 3 Units into the skin 3 (three) times daily with meals.  1 vial  0  . insulin glargine (LANTUS SOLOSTAR) 100 UNIT/ML injection Inject 24 Units into the skin at bedtime.  5 pen  PRN  . isosorbide mononitrate (IMDUR) 60 MG 24 hr tablet Take 1 tablet (60 mg total) by mouth daily.  30 tablet  3  . nitroGLYCERIN (NITROSTAT) 0.4 MG SL tablet Place 1 tablet (0.4 mg total) under the tongue every 5 (five) minutes x 3 doses as needed for chest pain.  25 tablet  3  . PARoxetine (PAXIL) 10 MG tablet TAKE 1 TABLET BY MOUTH AT BEDTIME  30 tablet  PRN  . sodium bicarbonate 650 MG tablet Take 1 tablet (650 mg total) by mouth 2 (two) times daily.  60 tablet  0   No Known Allergies  Family History  Problem Relation Age of  Onset  . Coronary artery disease Father    Objective:   Physical Exam BP 140/62  Pulse 75  Temp 98.2 F (36.8 C) (Oral)  Resp 16  Ht 5\' 3"  (1.6 m)  Wt 166 lb (75.297 kg)  BMI 29.41 kg/m2  SpO2 97% Weight: 166 lb (75.297 kg)  Constitutional: overweight, in NAD Eyes:  PERRLA, EOMI, no exophthalmos ENT: moist mucous membranes, no thyromegaly, no cervical lymphadenopathy Cardiovascular: RRR, No MRG Respiratory: CTA B Gastrointestinal: abdomen soft, NT, ND, BS+ Musculoskeletal: no deformities, strength intact in all 4 Skin: moist, warm, left lateral periankle dark discoloration and small abrasion - chronic. Periankle edema bilaterally. OTW, no ulcers or sores or deformities. Good sensation to light touch bilaterally. She wears comfortable diabetic shoes. Neurological: no tremor with outstretched hands, DTR normal in all 4     Assessment:     1. DM2, insulin-dependent, uncontrolled, with complications - CAD-status post MI in 2000 and 2011 - PVD - CKD stage IV  Plan:     1. The patient has a long history of DM 2, and has chronic kidney disease, which limits our treatment choices to insulin. She is on Lantus, which he takes in the morning, and due to her recent increasing hemoglobin A1c, she was started on NovoLog. However she did not get the NovoLog yet and we'll get him of this medication in 12 days. - We discussed about the fact that she will need to start checking her blood sugars more often, 2-3 times a day and documented a sugar log but I gave her - She her daughter are comfortable with this basal bolus regimen and Price is not the issue for them either. Daughter works third shift and continued the patient her injections and check her sugars. - For now we'll continue her current regimen since I do not have sugars to go by, and I recommended that she start the NovoLog as advised by Dr. Felicity Coyer. I gave her a sample NovoLog bottle. - He reported patient's meals are not equal in  size, with the dinner being the larger. However I would not want the patient to drop her sugars at night, therefore I will continue 3 units of NovoLog 3 times a day a.c. - I also advised him to move the Lantus injection at nighttime, if this will give [place] a better idea about the efficacy of the dose - Tonight they will inject 50% of the Lantus dose, 12 units, and it will start 24 units tomorrow night - I advised the patient not to snack after dinner, but the daughter tells me that this is a problem, since she grazes continuously from dinner to bedtime. She goes to bed at 6-7 PM, but does not sleep well, and is frequently woken up by either her dog or the need to go the restroom. She gets out of bed at 9-10 AM.  - given sugar log and advised how to fill it and to bring it at next appt - given foot care handout and explained the principles - given instructions for hypoglycemia management "15-15 rule" - Advised him to call her ophthalmologist to schedule a new appointment - No labs today - I would see the patient back in 2 weeks with her log and we'll make further decision about her insulin doses then

## 2012-08-31 NOTE — Patient Instructions (Addendum)
Please return in 2 weeks with your sugar log. Please move your Lantus to nighttime.  Tonight please take 12 units and resume 24 units starting tomorrow night. Start Novolog 3 units with meals tonight with dinner. Try not to snack before bedtime unless sugars are low. Call me if your sugars stay high or low.  Patient instructions for Diabetes mellitus type 2:  DIET AND EXERCISE Diet and exercise is an important part of diabetic treatment.  We recommended aerobic exercise in the form of brisk walking (working between 40-60% of maximal aerobic capacity, similar to brisk walking) for 150 minutes per week (such as 30 minutes five days per week) along with 3 times per week performing 'resistance' training (using various gauge rubber tubes with handles) 5-10 exercises involving the major muscle groups (upper body, lower body and core) performing 10-15 repetitions (or near fatigue) each exercise. Start at half the above goal but build slowly to reach the above goals. If limited by weight, joint pain, or disability, we recommend daily walking in a swimming pool with water up to waist to reduce pressure from joints while allow for adequate exercise.    BLOOD GLUCOSES Monitoring your blood glucoses is important for continued management of your diabetes. Please check your blood glucoses 2-3 times a day: fasting, before meals and at bedtime (you can rotate these measurements - e.g. one day check before the 3 meals, the next day check before 2 of the meals and before bedtime, etc.   HYPOGLYCEMIA (low blood sugar) Hypoglycemia is usually a reaction to not eating, exercising, or taking too much insulin/ other diabetes drugs.  Symptoms include tremors, sweating, hunger, confusion, headache, etc. Treat IMMEDIATELY with 15 grams of Carbs:   4 glucose tablets    cup regular juice/soda   2 tablespoons raisins   4 teaspoons sugar   1 tablespoon honey Recheck blood glucose in 15 mins and repeat above if still  symptomatic/blood glucose <100. Please contact our office at [phone] if you have questions about how to next handle your insulin.  RECOMMENDATIONS TO REDUCE YOUR RISK OF DIABETIC COMPLICATIONS: * Take your prescribed MEDICATION(S). * Follow a DIABETIC diet: Complex carbs, fiber rich foods, heart healthy fish twice weekly, (monounsaturated and polyunsaturated) fats * AVOID saturated/trans fats, high fat foods, >2,300 mg salt per day. * EXERCISE at least 5 times a week for 30 minutes or preferably daily.  * DO NOT SMOKE OR DRINK more than 1 drink a day. * Check your FEET every day. Do not wear tightfitting shoes. Contact us if you develop an ulcer * See your EYE doctor once a year or more if needed * Get a FLU shot once a year * Get a PNEUMONIA vaccine every 5 years and once after age 45 years  GOALS:  * Your Hemoglobin A1c of 7%  * Your Systolic BP should be 130 or lower  * Your Diastolic BP should be 80 or lower  * Your HDL (Good Cholesterol) should be 40 or higher  * Your LDL (Bad Cholesterol) should be 100 or lower  * Your Triglycerides should be 150 or lower  * Your Urine microalbumin (kidney function) of <30 * Your Body Mass Index should be 25 or lower  We will be glad to help you achieve these goals. Our telephone number is: [phone].

## 2012-09-13 ENCOUNTER — Telehealth: Payer: Self-pay | Admitting: Internal Medicine

## 2012-09-13 NOTE — Telephone Encounter (Signed)
Called and spoke with daughter. [name] had a CBG of 113 at 10 PM last night, but decreased to 62 in the middle of the night. This morning she was at 93, before lunch 159 and before dinner 254 (she did not get mealtime insulin with the first 2 meals of the day, but she got it with dinner). This is a good indication that she really needs mealtime insulin, but I decreased the dose of her Lantus from 24 to 20, to start tonight. Daughter agrees to plan, and she will call me if patient experiences another low.

## 2012-09-13 NOTE — Telephone Encounter (Signed)
The patient's daughter called to state that the patient's blood sugar has been very low and is "all out of whack".  The patient's daughter requests a return call to discuss the patient's blood sugar levels and new insulin regime.  The patient's daughter may be reached at [phone].

## 2012-09-14 ENCOUNTER — Ambulatory Visit: Payer: Medicare Other | Admitting: Internal Medicine

## 2012-09-14 ENCOUNTER — Ambulatory Visit
Admission: RE | Admit: 2012-09-14 | Discharge: 2012-09-14 | Disposition: A | Discharge status: Home / Self Care | Payer: Medicare Other | Source: Ambulatory Visit | Attending: Nephrology | Admitting: Nephrology

## 2012-09-14 DIAGNOSIS — N200 Calculus of kidney: Secondary | ICD-10-CM | POA: Diagnosis not present

## 2012-09-17 DIAGNOSIS — E1142 Type 2 diabetes mellitus with diabetic polyneuropathy: Secondary | ICD-10-CM | POA: Diagnosis not present

## 2012-09-17 DIAGNOSIS — I10 Essential (primary) hypertension: Secondary | ICD-10-CM | POA: Diagnosis not present

## 2012-09-17 DIAGNOSIS — E7800 Pure hypercholesterolemia, unspecified: Secondary | ICD-10-CM | POA: Diagnosis not present

## 2012-09-17 DIAGNOSIS — E1149 Type 2 diabetes mellitus with other diabetic neurological complication: Secondary | ICD-10-CM | POA: Diagnosis not present

## 2012-09-17 DIAGNOSIS — I2510 Atherosclerotic heart disease of native coronary artery without angina pectoris: Secondary | ICD-10-CM | POA: Diagnosis not present

## 2012-09-18 ENCOUNTER — Encounter: Payer: Self-pay | Admitting: Internal Medicine

## 2012-09-18 ENCOUNTER — Ambulatory Visit (INDEPENDENT_AMBULATORY_CARE_PROVIDER_SITE_OTHER): Payer: Medicare Other | Admitting: Internal Medicine

## 2012-09-18 VITALS — BP 164/52 | HR 63 | Temp 97.3°F | Resp 16 | Ht 63.0 in | Wt 171.0 lb

## 2012-09-18 DIAGNOSIS — E1165 Type 2 diabetes mellitus with hyperglycemia: Secondary | ICD-10-CM

## 2012-09-18 DIAGNOSIS — E1129 Type 2 diabetes mellitus with other diabetic kidney complication: Secondary | ICD-10-CM

## 2012-09-18 MED ORDER — INSULIN REGULAR HUMAN 100 UNIT/ML IJ SOLN: 8.0000 [IU] | Freq: Three times a day (TID) | INTRAMUSCULAR | Status: DC

## 2012-09-18 NOTE — Progress Notes (Signed)
Patient ID: [name], female   DOB: 1929-01-23, 79 y.o.   MRN: [id number] Subjective:   HPI [name] is an 79 year old woman, returning for management of DM2, dx 1990, insulin-dependent, uncontrolled, with complications (CAD-status post MI in 2000 and 2011, PVD, CKD stage IV). She is here accompanied by her daughter [name]), who lives with her, and who checks her sugars and gives her insulin. The daughter works nights.  She is on: - Lantus 20 units every morning (I decreased it from 20 units 5 days ago over the phone, as pt had an instance when her sugars dropped o/n.  - NovoLog 8 units 3 times a day with meals - started this month, but my last instructions for them was for 3 units tid (they read it as 8 units tid Her last hemoglobin A1c was 14.3% (08/24/2012), increased from 10.5% (05/18/2012).  Bariers to proper treatment are: decreased compliance due to dementia, mental confusion, and different caregivers (her daughters [name] and [name]).  Patient checks her sugars once time a day and they are: - am: 150-400  (but mostly in the 200s) She has hypoglycemia awareness. Lowest blood sugar: 62 x1, 2 values in the 80s.. Highest blood sugar 590, after ate cake - not often. The daughter tells me that the patient grazes all the time, between meals, and she believes that that's why her sugars stay high. She also tells me that, at one point, patient injected 50 units of NovoLog by herself (!), and she subsequently dropped her sugars to the 80s afterwards. The patient also had an instance in which she finished and entire bag of peppermint candy when the daughter was not paying attention to her.  The patient  has chronic kidney disease, with her last GFR 30, and the BUN/creatinine ratio of 16/1.56 in 07/16/2012. She also has coronary artery disease status post 2 MIs and PVD.  She sees her eye dr. but not every year. Last visit 2 years ago. She had cataract sx in the past. The daughter tells me that they  would schedule a new appointment. Does have numbness and tingling in her legs and also burning R>L. She goes to bed at 6-7 PM, but does not sleep well, and is frequently woken up by either her dog or the need to go the restroom. She gets out of bed at 9-10 AM.   I reviewed pt's medications, allergies, PMH, social hx, family hx and no changes required.  Review of Systems Constitutional: + weight gain 20 lbs in 2-3 years, + fatigue. Denies increased thirst and urination. Poor sleep. Nocturia has improved since I talked to her last, she does not need to wear Depends at night anymore. Eyes: no blurry vision, no xerophthalmia ENT: no sore throat, no nodules palpated in throat, no dysphagia/odynophagia, no hoarseness Cardiovascular: no CP/SOB/palpitations/+ leg swelling Respiratory: no cough/no SOB Gastrointestinal: no N/V/D/C Musculoskeletal: no muscle/+ joint aches Skin: no rashes, hair loss Neurological: no tremors/numbness/tingling/dizziness Psychiatric: no depression/anxiety  Objective:   Physical Exam BP 164/52  Pulse 63  Temp(Src) 97.3 F (36.3 C) (Oral)  Resp 16  Ht 5\' 3"  (1.6 m)  Wt 171 lb (77.565 kg)  BMI 30.3 kg/m2  SpO2 97% Wt Readings from Last 3 Encounters:  09/18/12 171 lb (77.565 kg)  08/31/12 166 lb (75.297 kg)  08/24/12 165 lb 6.4 oz (75.025 kg)   Constitutional: overweight, in NAD Eyes: PERRLA, EOMI, no exophthalmos ENT: moist mucous membranes, no thyromegaly, no cervical lymphadenopathy Cardiovascular: RRR, No MRG Respiratory: CTA  B Gastrointestinal: abdomen soft, NT, ND, BS+ Musculoskeletal: no deformities, strength intact in all 4 Skin: moist, warm, left lateral periankle dark discoloration and small abrasion - chronic. Periankle edema bilaterally. OTW, no ulcers or sores or deformities. Good sensation to light touch bilaterally. She wears comfortable diabetic shoes. Neurological: no tremor with outstretched hands, DTR normal in all 4     Assessment:      1. DM2, insulin-dependent, uncontrolled, with complications - CAD-status post MI in 2000 and 2011 - PVD - CKD stage IV  2. Peripheral neuropathy Plan:     1. The patient has a long history of DM 2, and has chronic kidney disease, which limits our treatment choices to insulin. She is on Lantus, which we moved from morning to bedtime, and due to her recent increasing hemoglobin A1c, we started on NovoLog at our last visit. By mistake, the daughter gave her 8 units of NovoLog instead of 3 units. However patient sugars stayed high, with a few lows in the 80s, which they could explain. - the patient sugars remained high despite the addition of rapid acting insulin, and I agree with patient's daughter, that her grazing greatly impact her sugars - because of this, I believe that regular insulin would be a better choice for her, since this offers her longer coverage - I advised him to start 8 units of regular insulin 30 minutes before the 3 meals - they should be more vigilant with patient's sugars in the next few days, to avoid lows. They know to call me if patient develops hypoglycemia. - alimenting her Lantus dose at 20 units each bedtime - No labs today - I would see the patient back in one month with her log   2. Peripheral neuropathy - The patient is bothered by burning in her feet especially at night, and I recommended alpha lipoic acid and B complex vitamin, which are both over-the-counter

## 2012-09-18 NOTE — Patient Instructions (Addendum)
Please stop the NovoLog insulin. Start Regular insulin 8 units 30 minutes before a meal. Maintain Lantus 20 units at bedtime.  For the burning feet (neuropathy), you can try:  Alpha lipoic acid 200 mg twice a day   B complex 1 tablet once a day  Please return in 1 month with your log.

## 2012-09-26 DIAGNOSIS — D649 Anemia, unspecified: Secondary | ICD-10-CM | POA: Diagnosis not present

## 2012-10-01 ENCOUNTER — Emergency Department (HOSPITAL_COMMUNITY): Payer: Medicare Other

## 2012-10-01 ENCOUNTER — Inpatient Hospital Stay (HOSPITAL_COMMUNITY)
Admission: EM | Admit: 2012-10-01 | Discharge: 2012-10-04 | DRG: 287 | Disposition: A | Discharge status: Home / Self Care | Payer: Medicare Other | Attending: Emergency Medicine | Admitting: Emergency Medicine

## 2012-10-01 ENCOUNTER — Encounter (HOSPITAL_COMMUNITY): Payer: Self-pay | Admitting: *Deleted

## 2012-10-01 DIAGNOSIS — Z9861 Coronary angioplasty status: Secondary | ICD-10-CM

## 2012-10-01 DIAGNOSIS — F0390 Unspecified dementia without behavioral disturbance: Secondary | ICD-10-CM | POA: Diagnosis present

## 2012-10-01 DIAGNOSIS — D638 Anemia in other chronic diseases classified elsewhere: Secondary | ICD-10-CM | POA: Diagnosis present

## 2012-10-01 DIAGNOSIS — I2510 Atherosclerotic heart disease of native coronary artery without angina pectoris: Principal | ICD-10-CM | POA: Diagnosis present

## 2012-10-01 DIAGNOSIS — E7800 Pure hypercholesterolemia, unspecified: Secondary | ICD-10-CM | POA: Diagnosis not present

## 2012-10-01 DIAGNOSIS — I252 Old myocardial infarction: Secondary | ICD-10-CM

## 2012-10-01 DIAGNOSIS — E119 Type 2 diabetes mellitus without complications: Secondary | ICD-10-CM | POA: Diagnosis present

## 2012-10-01 DIAGNOSIS — I739 Peripheral vascular disease, unspecified: Secondary | ICD-10-CM | POA: Diagnosis present

## 2012-10-01 DIAGNOSIS — N184 Chronic kidney disease, stage 4 (severe): Secondary | ICD-10-CM | POA: Diagnosis present

## 2012-10-01 DIAGNOSIS — Z87891 Personal history of nicotine dependence: Secondary | ICD-10-CM | POA: Diagnosis not present

## 2012-10-01 DIAGNOSIS — I129 Hypertensive chronic kidney disease with stage 1 through stage 4 chronic kidney disease, or unspecified chronic kidney disease: Secondary | ICD-10-CM | POA: Diagnosis present

## 2012-10-01 DIAGNOSIS — Z853 Personal history of malignant neoplasm of breast: Secondary | ICD-10-CM | POA: Diagnosis not present

## 2012-10-01 DIAGNOSIS — R079 Chest pain, unspecified: Secondary | ICD-10-CM

## 2012-10-01 DIAGNOSIS — M1990 Unspecified osteoarthritis, unspecified site: Secondary | ICD-10-CM | POA: Diagnosis present

## 2012-10-01 DIAGNOSIS — I200 Unstable angina: Secondary | ICD-10-CM | POA: Diagnosis present

## 2012-10-01 DIAGNOSIS — F329 Major depressive disorder, single episode, unspecified: Secondary | ICD-10-CM | POA: Diagnosis present

## 2012-10-01 DIAGNOSIS — F3289 Other specified depressive episodes: Secondary | ICD-10-CM | POA: Diagnosis present

## 2012-10-01 DIAGNOSIS — I10 Essential (primary) hypertension: Secondary | ICD-10-CM | POA: Diagnosis not present

## 2012-10-01 DIAGNOSIS — R0989 Other specified symptoms and signs involving the circulatory and respiratory systems: Secondary | ICD-10-CM | POA: Diagnosis not present

## 2012-10-01 LAB — CBC WITH DIFFERENTIAL/PLATELET
Basophils Absolute: 0 10*3/uL (ref 0.0–0.1)
Basophils Relative: 0 % (ref 0–1)
Eosinophils Absolute: 0.2 10*3/uL (ref 0.0–0.7)
Eosinophils Relative: 3 % (ref 0–5)
HCT: 29 % — ABNORMAL LOW (ref 36.0–46.0)
Hemoglobin: 9.8 g/dL — ABNORMAL LOW (ref 12.0–15.0)
Lymphocytes Relative: 27 % (ref 12–46)
Lymphs Abs: 1.9 10*3/uL (ref 0.7–4.0)
MCH: 30 pg (ref 26.0–34.0)
MCHC: 33.8 g/dL (ref 30.0–36.0)
MCV: 88.7 fL (ref 78.0–100.0)
Monocytes Absolute: 0.4 10*3/uL (ref 0.1–1.0)
Monocytes Relative: 6 % (ref 3–12)
Neutro Abs: 4.5 10*3/uL (ref 1.7–7.7)
Neutrophils Relative %: 64 % (ref 43–77)
Platelets: 272 10*3/uL (ref 150–400)
RBC: 3.27 MIL/uL — ABNORMAL LOW (ref 3.87–5.11)
RDW: 13.6 % (ref 11.5–15.5)
WBC: 7.1 10*3/uL (ref 4.0–10.5)

## 2012-10-01 LAB — COMPREHENSIVE METABOLIC PANEL
ALT: 12 U/L (ref 0–35)
AST: 16 U/L (ref 0–37)
Albumin: 3.1 g/dL — ABNORMAL LOW (ref 3.5–5.2)
Alkaline Phosphatase: 235 U/L — ABNORMAL HIGH (ref 39–117)
BUN: 25 mg/dL — ABNORMAL HIGH (ref 6–23)
CO2: 19 mEq/L (ref 19–32)
Calcium: 8.6 mg/dL (ref 8.4–10.5)
Chloride: 102 mEq/L (ref 96–112)
Creatinine, Ser: 1.89 mg/dL — ABNORMAL HIGH (ref 0.50–1.10)
GFR calc Af Amer: 28 mL/min — ABNORMAL LOW (ref >90)
GFR calc non Af Amer: 24 mL/min — ABNORMAL LOW (ref >90)
Glucose, Bld: 370 mg/dL — ABNORMAL HIGH (ref 70–99)
Potassium: 4.7 mEq/L (ref 3.5–5.1)
Sodium: 132 mEq/L — ABNORMAL LOW (ref 135–145)
Total Bilirubin: 0.2 mg/dL — ABNORMAL LOW (ref 0.3–1.2)
Total Protein: 7.6 g/dL (ref 6.0–8.3)

## 2012-10-01 LAB — POCT I-STAT TROPONIN I: Troponin i, poc: 0 ng/mL (ref 0.00–0.08)

## 2012-10-01 MED ORDER — SODIUM CHLORIDE 0.9 % IV SOLN
INTRAVENOUS | Status: DC
  Administered 2012-10-01: via INTRAVENOUS

## 2012-10-01 MED ORDER — DONEPEZIL HCL 10 MG PO TABS
10.0000 mg | ORAL_TABLET | Freq: Every day | ORAL | Status: DC
  Administered 2012-10-02 – 2012-10-03 (×2): 10 mg via ORAL
  Filled 2012-10-01 (×5): qty 1

## 2012-10-01 MED ORDER — NITROGLYCERIN IN D5W 200-5 MCG/ML-% IV SOLN
5.0000 ug/min | INTRAVENOUS | Status: DC
  Administered 2012-10-01: 5 ug/min via INTRAVENOUS
  Filled 2012-10-01: qty 250

## 2012-10-01 MED ORDER — ASPIRIN 300 MG RE SUPP: 300.0000 mg | RECTAL | Status: DC

## 2012-10-01 MED ORDER — PANTOPRAZOLE SODIUM 40 MG PO TBEC
40.0000 mg | DELAYED_RELEASE_TABLET | Freq: Every day | ORAL | Status: DC
  Administered 2012-10-02 – 2012-10-04 (×3): 40 mg via ORAL
  Filled 2012-10-01 (×3): qty 1

## 2012-10-01 MED ORDER — ASPIRIN 81 MG PO CHEW: 324.0000 mg | CHEWABLE_TABLET | ORAL | Status: DC

## 2012-10-01 MED ORDER — NITROGLYCERIN 0.4 MG SL SUBL: 0.4000 mg | SUBLINGUAL_TABLET | SUBLINGUAL | Status: DC | PRN

## 2012-10-01 MED ORDER — ASPIRIN EC 81 MG PO TBEC
81.0000 mg | DELAYED_RELEASE_TABLET | Freq: Every day | ORAL | Status: DC
  Administered 2012-10-02: 81 mg via ORAL
  Filled 2012-10-01: qty 1

## 2012-10-01 MED ORDER — FERROUS SULFATE 325 (65 FE) MG PO TABS
325.0000 mg | ORAL_TABLET | Freq: Every day | ORAL | Status: DC
  Administered 2012-10-03 – 2012-10-04 (×2): 325 mg via ORAL
  Filled 2012-10-01 (×4): qty 1

## 2012-10-01 MED ORDER — HEPARIN SODIUM (PORCINE) 5000 UNIT/ML IJ SOLN
5000.0000 [IU] | Freq: Three times a day (TID) | INTRAMUSCULAR | Status: DC
  Administered 2012-10-02 – 2012-10-03 (×4): 5000 [IU] via SUBCUTANEOUS
  Filled 2012-10-01 (×7): qty 1

## 2012-10-01 MED ORDER — INSULIN ASPART 100 UNIT/ML ~~LOC~~ SOLN
0.0000 [IU] | Freq: Three times a day (TID) | SUBCUTANEOUS | Status: DC
  Administered 2012-10-02: 7 [IU] via SUBCUTANEOUS
  Administered 2012-10-02 – 2012-10-04 (×3): 2 [IU] via SUBCUTANEOUS

## 2012-10-01 MED ORDER — LOSARTAN POTASSIUM 50 MG PO TABS
50.0000 mg | ORAL_TABLET | Freq: Every day | ORAL | Status: DC
  Administered 2012-10-02 – 2012-10-04 (×3): 50 mg via ORAL
  Filled 2012-10-01 (×3): qty 1

## 2012-10-01 MED ORDER — ACETAMINOPHEN 325 MG PO TABS: 650.0000 mg | ORAL_TABLET | ORAL | Status: DC | PRN

## 2012-10-01 MED ORDER — CLOPIDOGREL BISULFATE 75 MG PO TABS
75.0000 mg | ORAL_TABLET | Freq: Every day | ORAL | Status: DC
  Administered 2012-10-02 – 2012-10-04 (×3): 75 mg via ORAL
  Filled 2012-10-01 (×3): qty 1

## 2012-10-01 MED ORDER — SODIUM BICARBONATE 650 MG PO TABS
650.0000 mg | ORAL_TABLET | Freq: Two times a day (BID) | ORAL | Status: DC
  Administered 2012-10-01 – 2012-10-04 (×6): 650 mg via ORAL
  Filled 2012-10-01 (×7): qty 1

## 2012-10-01 MED ORDER — AMLODIPINE BESYLATE 5 MG PO TABS
5.0000 mg | ORAL_TABLET | Freq: Every day | ORAL | Status: DC
  Administered 2012-10-02 – 2012-10-03 (×2): 5 mg via ORAL
  Filled 2012-10-01 (×2): qty 1

## 2012-10-01 MED ORDER — INSULIN GLARGINE 100 UNIT/ML ~~LOC~~ SOLN
10.0000 [IU] | Freq: Every day | SUBCUTANEOUS | Status: DC
  Administered 2012-10-01 – 2012-10-03 (×3): 10 [IU] via SUBCUTANEOUS

## 2012-10-01 MED ORDER — PAROXETINE HCL 10 MG PO TABS
10.0000 mg | ORAL_TABLET | Freq: Every evening | ORAL | Status: DC
  Administered 2012-10-02 – 2012-10-03 (×2): 10 mg via ORAL
  Filled 2012-10-01 (×3): qty 1

## 2012-10-01 MED ORDER — ASPIRIN 325 MG PO TABS
325.0000 mg | ORAL_TABLET | Freq: Once | ORAL | Status: AC
  Administered 2012-10-01: 325 mg via ORAL
  Filled 2012-10-01: qty 1

## 2012-10-01 MED ORDER — FAMOTIDINE 20 MG PO TABS
20.0000 mg | ORAL_TABLET | Freq: Every day | ORAL | Status: DC
  Administered 2012-10-02 – 2012-10-03 (×2): 20 mg via ORAL
  Filled 2012-10-01 (×3): qty 1

## 2012-10-01 MED ORDER — ONDANSETRON HCL 4 MG/2ML IJ SOLN: 4.0000 mg | Freq: Four times a day (QID) | INTRAMUSCULAR | Status: DC | PRN

## 2012-10-01 MED ORDER — METOPROLOL TARTRATE 12.5 MG HALF TABLET
12.5000 mg | ORAL_TABLET | Freq: Two times a day (BID) | ORAL | Status: DC
  Administered 2012-10-01 – 2012-10-04 (×6): 12.5 mg via ORAL
  Filled 2012-10-01 (×7): qty 1

## 2012-10-01 MED ORDER — ATORVASTATIN CALCIUM 20 MG PO TABS
20.0000 mg | ORAL_TABLET | Freq: Every day | ORAL | Status: DC
  Administered 2012-10-02 – 2012-10-04 (×3): 20 mg via ORAL
  Filled 2012-10-01 (×3): qty 1

## 2012-10-01 NOTE — ED Notes (Signed)
Attempted to call report to floor.  Was told RN would return my call 

## 2012-10-01 NOTE — ED Notes (Signed)
Pt is here with midsternal chest pain that started last nite.  No radiation.  The pain started easing off on her way to the hospital

## 2012-10-01 NOTE — H&P (Signed)
[name] is an 79 y.o. female.   Chief Complaint: Recurrent chest pain HPI: Patient is 79 year old female with past medical history significant for multiple medical problems i.e. coronary artery disease history of MI x2 in the past status post PTCA to RCA circumflex obtuse marginal LAD diagonal 2 in the past multivessel coronary artery disease with diffuse small vessel disease, hypertension, insulin requiring diabetes matters, chronic kidney disease stage IV, hypercholesteremia, history of tobacco abuse in the past, history of syncope in the past, anemia of chronic disease, history of herpes zoster in the past, dementia, came to the ER complaining of retrosternal chest pain off and on since last night radiating to right shoulder and arm grade 4-6/10 without associated nausea vomiting or diaphoresis. Patient states chest pain feels similar in nature when she had MI and PCI in the past. Denies any palpitation lightheadedness or syncope. Denies PND orthopnea leg swelling. Patient also complains of exertional chest pain for last few months but did not seek any medical attention states her activity is limited. Denies any relation of chest pain to food breathing or movement.  Past Medical History  Diagnosis Date  . 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 ANEMIA   . DEPRESSION   . HYPERTENSION   . PVD   . INSOMNIA, CHRONIC   . Dementia   . DIABETES MELLITUS, TYPE II   . Hypercholesteremia   . Chronic kidney disease (CKD), stage IV (severe)   . Anemia of chronic disease   . Angina   . Non-Q wave myocardial infarction 10/2009    /E-chart  . Inferior MI 2000    "quiet"  . Breast cancer 2000    right    Past Surgical History  Procedure Laterality Date  . Capsulectomy  12/27/2010    L eye  . Breast lumpectomy  2000    right  . Cataract extraction w/ intraocular lens  implant, bilateral    . Coronary artery bypass graft  2000    CABG X1  . Coronary angioplasty with stent placement     "twice"  . Eye surgery      Cataracts (R) eye 06/1999 & (L) eye 09/1999- [place]  . Combined hysteroscopy diagnostic / d&c  07/2004    /e-chart  . Dilation and curettage of uterus    . Cardiac surgery      Family History  Problem Relation Age of Onset  . Coronary artery disease Father    Social History:  reports that she has quit smoking. Her smoking use included Cigarettes. She has a 20 pack-year smoking history. She has never used smokeless tobacco. She reports that  drinks alcohol. She reports that she does not use illicit drugs.  Allergies: No Known Allergies   (Not in a hospital admission)  Results for orders placed during the hospital encounter of 10/01/12 (from the past 48 hour(s))  COMPREHENSIVE METABOLIC PANEL     Status: Abnormal   Collection Time    10/01/12  4:34 PM      Result Value Range   Sodium 132 (*) 135 - 145 mEq/L   Potassium 4.7  3.5 - 5.1 mEq/L   Chloride 102  96 - 112 mEq/L   CO2 19  19 - 32 mEq/L   Glucose, Bld 370 (*) 70 - 99 mg/dL   BUN 25 (*) 6 - 23 mg/dL   Creatinine, Ser 7.82 (*) 0.50 - 1.10 mg/dL   Calcium 8.6  8.4 - 95.6 mg/dL  Total Protein 7.6  6.0 - 8.3 g/dL   Albumin 3.1 (*) 3.5 - 5.2 g/dL   AST 16  0 - 37 U/L   ALT 12  0 - 35 U/L   Alkaline Phosphatase 235 (*) 39 - 117 U/L   Total Bilirubin 0.2 (*) 0.3 - 1.2 mg/dL   GFR calc non Af Amer 24 (*) >90 mL/min   GFR calc Af Amer 28 (*) >90 mL/min   Comment:            The eGFR has been calculated     using the CKD EPI equation.     This calculation has not been     validated in all clinical     situations.     eGFR's persistently     <90 mL/min signify     possible Chronic Kidney Disease.  CBC WITH DIFFERENTIAL     Status: Abnormal   Collection Time    10/01/12  4:34 PM      Result Value Range   WBC 7.1  4.0 - 10.5 K/uL   RBC 3.27 (*) 3.87 - 5.11 MIL/uL   Hemoglobin 9.8 (*) 12.0 - 15.0 g/dL   HCT 91.4 (*) 78.2 - 95.6 %   MCV 88.7  78.0 - 100.0 fL   MCH 30.0  26.0 - 34.0 pg    MCHC 33.8  30.0 - 36.0 g/dL   RDW 21.3  08.6 - 57.8 %   Platelets 272  150 - 400 K/uL   Neutrophils Relative 64  43 - 77 %   Neutro Abs 4.5  1.7 - 7.7 K/uL   Lymphocytes Relative 27  12 - 46 %   Lymphs Abs 1.9  0.7 - 4.0 K/uL   Monocytes Relative 6  3 - 12 %   Monocytes Absolute 0.4  0.1 - 1.0 K/uL   Eosinophils Relative 3  0 - 5 %   Eosinophils Absolute 0.2  0.0 - 0.7 K/uL   Basophils Relative 0  0 - 1 %   Basophils Absolute 0.0  0.0 - 0.1 K/uL  POCT I-STAT TROPONIN I     Status: None   Collection Time    10/01/12  4:43 PM      Result Value Range   Troponin i, poc 0.00  0.00 - 0.08 ng/mL   Comment 3            Comment: Due to the release kinetics of cTnI,     a negative result within the first hours     of the onset of symptoms does not rule out     myocardial infarction with certainty.     If myocardial infarction is still suspected,     repeat the test at appropriate intervals.   Dg Chest 2 View  10/01/2012  *RADIOLOGY REPORT*  Clinical Data: Chest pain, hypertension, diabetes, breast cancer  CHEST - 2 VIEW  Comparison: 04/01/2012  Findings: Enlargement of cardiac silhouette with pulmonary vascular congestion. Mediastinal contours normal. Peribronchial thickening without infiltrate, failure, or pleural effusion. No pneumothorax or acute osseous findings.  IMPRESSION: Enlargement of cardiac silhouette with pulmonary vascular congestion. Mild bronchitic changes. No acute infiltrate.   Original Report Authenticated By: Ulyses Southward, M.D.     Review of Systems  Constitutional: Negative for fever and chills.  Eyes: Negative for blurred vision and double vision.  Respiratory: Negative for cough, hemoptysis and sputum production.   Cardiovascular: Positive for chest pain. Negative for palpitations, orthopnea and claudication.  Gastrointestinal: Negative for nausea, vomiting and abdominal pain.  Neurological: Negative for dizziness and headaches.    Blood pressure 189/56, pulse 87,  temperature 98.1 F (36.7 C), temperature source Oral, resp. rate 19, height 5\' 3"  (1.6 m), SpO2 100.00%. Physical Exam  Constitutional: She is oriented to person, place, and time.  HENT:  Head: Normocephalic and atraumatic.  Eyes: Conjunctivae are normal. Pupils are equal, round, and reactive to light. Left eye exhibits no discharge. No scleral icterus.  Neck: Normal range of motion. Neck supple. No JVD present. No tracheal deviation present. No thyromegaly present.  Cardiovascular: Normal rate and regular rhythm.   Murmur (Soft systolic murmur and S4 gallop noted) heard. Respiratory: Effort normal and breath sounds normal. No respiratory distress. She has no wheezes. She has no rales.  GI: Soft. Bowel sounds are normal. She exhibits no distension. There is no tenderness. There is no rebound.  Musculoskeletal:  No clubbing cyanosis trace edema noted  Neurological: She is alert and oriented to person, place, and time.     Assessment/Plan Unstable angina rule out MI Coronary artery disease history of MI x2 in the past status post multivessel PCI in the past as above Diffuse multivessel small vessel disease Hypertension Insulin requiring diabetes mellitus Chronic kidney disease stage IV Dementia Hypercholesteremia Anemia of chronic disease Degenerative joint disease History of herpes zoster in the past Plan As per orders We'll schedule for nuclear stress test in a.m. once rule out for MI  [facility] N 10/01/2012, 8:21 PM

## 2012-10-01 NOTE — ED Notes (Signed)
Admitting MD in room with pt. 

## 2012-10-01 NOTE — ED Provider Notes (Signed)
History     CSN: [id number]  Arrival date & time 10/01/12  1614   First MD Initiated Contact with Patient 10/01/12 1842      Chief Complaint  Patient presents with  . Chest Pain   Patient is a 79 y.o. female presenting with chest pain.  Chest Pain Associated symptoms: no abdominal pain, no cough, no fever, no headache, no nausea, no shortness of breath and not vomiting    [name] is a 79 y.o. female who presents to the emergency department for concern that she may have had another MI.  Patient reports that last night she experienced an episode of sharp L sided chest pain.  Similar to previous MI.  Occurred while at rest.  Then today had similar episode this AM.  Again resolved spontaneously and described as anginal equivalent.  No cough.  Not reproducible with touch.  No diaphoresis.  Chronic SOB, but unchanged during episode.  No other symptoms.  Past Medical History  Diagnosis Date  . 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 ANEMIA   . DEPRESSION   . HYPERTENSION   . PVD   . INSOMNIA, CHRONIC   . Dementia   . DIABETES MELLITUS, TYPE II   . Hypercholesteremia   . Chronic kidney disease (CKD), stage IV (severe)   . Anemia of chronic disease   . Angina   . Non-Q wave myocardial infarction 10/2009    /E-chart  . Inferior MI 2000    "quiet"  . Breast cancer 2000    right    Past Surgical History  Procedure Laterality Date  . Capsulectomy  12/27/2010    L eye  . Breast lumpectomy  2000    right  . Cataract extraction w/ intraocular lens  implant, bilateral    . Coronary artery bypass graft  2000    CABG X1  . Coronary angioplasty with stent placement      "twice"  . Eye surgery      Cataracts (R) eye 06/1999 & (L) eye 09/1999- [place]  . Combined hysteroscopy diagnostic / d&c  07/2004    /e-chart  . Dilation and curettage of uterus    . Cardiac surgery      Family History  Problem Relation Age of Onset  . Coronary artery disease Father     History   Substance Use Topics  . Smoking status: Former Smoker -- 1.00 packs/day for 20 years    Types: Cigarettes  . Smokeless tobacco: Never Used     Comment: 11/21/11 "quit smoking 30-40 years ago  . Alcohol Use: Yes     Comment: 11/21/11 "used to drink quit alot; last drink of alcohol maybe 30 years ago"    OB History   Grav Para Term Preterm Abortions TAB SAB Ect Mult Living                  Review of Systems  Constitutional: Negative for fever and chills.  HENT: Negative for congestion, rhinorrhea, neck pain and neck stiffness.   Respiratory: Negative for cough and shortness of breath.   Cardiovascular: Positive for chest pain.  Gastrointestinal: Negative for nausea, vomiting, abdominal pain, diarrhea and abdominal distention.  Endocrine: Negative for polyuria.  Genitourinary: Negative for dysuria.  Skin: Negative for rash.  Neurological: Negative for headaches.  Psychiatric/Behavioral: Negative.   All other systems reviewed and are negative.    Allergies  Review of patient's allergies indicates no known allergies.  Home  Medications   Current Outpatient Rx  Name  Route  Sig  Dispense  Refill  . amLODipine (NORVASC) 10 MG tablet      TAKE 1 TABLET BY MOUTH DAILY   30 tablet   5   . aspirin 81 MG tablet   Oral   Take 1 tablet (81 mg total) by mouth daily.   30 tablet   3   . atorvastatin (LIPITOR) 20 MG tablet      TAKE 1 TABLET BY MOUTH DAILY   30 tablet   5   . B-D INS SYRINGE 0.5CC/30GX1/2" 30G X 1/2" 0.5 ML MISC      Use as directed         . Calcium Carbonate-Vitamin D (CALCIUM 600-D) 600-400 MG-UNIT per tablet   Oral   Take 1 tablet by mouth 2 (two) times daily.         . clopidogrel (PLAVIX) 75 MG tablet      TAKE 1 TABLET BY MOUTH DAILY   30 tablet   5   . donepezil (ARICEPT) 10 MG tablet   Oral   Take 1 tablet (10 mg total) by mouth at bedtime.   90 tablet   3   . famotidine (PEPCID) 20 MG tablet      TAKE 1 TABLET BY MOUTH EVERY  NIGHT AT BEDTIME   30 tablet   PRN   . ferrous sulfate 325 (65 FE) MG tablet   Oral   Take 1 tablet (325 mg total) by mouth daily with breakfast.   30 tablet   3   . HEALTHWISE SHORT PEN NEEDLES 31G X 8 MM MISC               . insulin glargine (LANTUS) 100 UNIT/ML injection   Subcutaneous   Inject 20 Units into the skin at bedtime.         . insulin regular (HUMULIN R) 100 units/mL injection   Subcutaneous   Inject 0.08 mLs (8 Units total) into the skin 3 (three) times daily before meals.   10 mL   12   . isosorbide mononitrate (IMDUR) 60 MG 24 hr tablet   Oral   Take 1 tablet (60 mg total) by mouth daily.   30 tablet   3   . nitroGLYCERIN (NITROSTAT) 0.4 MG SL tablet   Sublingual   Place 1 tablet (0.4 mg total) under the tongue every 5 (five) minutes x 3 doses as needed for chest pain.   25 tablet   3   . PARoxetine (PAXIL) 10 MG tablet      TAKE 1 TABLET BY MOUTH AT BEDTIME   30 tablet   PRN   . sodium bicarbonate 650 MG tablet   Oral   Take 1 tablet (650 mg total) by mouth 2 (two) times daily.   60 tablet   0     BP 189/56  Pulse 87  Temp(Src) 98.1 F (36.7 C) (Oral)  Resp 19  Ht 5\' 3"  (1.6 m)  SpO2 100%  Physical Exam  Nursing note and vitals reviewed. Constitutional: She is oriented to person, place, and time. She appears well-developed and well-nourished. No distress.  HENT:  Head: Normocephalic and atraumatic.  Right Ear: External ear normal.  Left Ear: External ear normal.  Nose: Nose normal.  Mouth/Throat: Oropharynx is clear and moist. No oropharyngeal exudate.  Eyes: EOM are normal. Pupils are equal, round, and reactive to light.  Neck: Normal range of motion.  Neck supple. No tracheal deviation present.  Cardiovascular: Normal rate.   Pulmonary/Chest: Effort normal and breath sounds normal. No stridor. No respiratory distress. She has no wheezes. She has no rales.  Abdominal: Soft. She exhibits no distension. There is no  tenderness. There is no rebound.  Musculoskeletal: Normal range of motion.  Neurological: She is alert and oriented to person, place, and time.  Skin: Skin is warm and dry. She is not diaphoretic.    ED Course  Procedures (including critical care time)  Labs Reviewed  COMPREHENSIVE METABOLIC PANEL - Abnormal; Notable for the following:    Sodium 132 (*)    Glucose, Bld 370 (*)    BUN 25 (*)    Creatinine, Ser 1.89 (*)    Albumin 3.1 (*)    Alkaline Phosphatase 235 (*)    Total Bilirubin 0.2 (*)    GFR calc non Af Amer 24 (*)    GFR calc Af Amer 28 (*)    All other components within normal limits  CBC WITH DIFFERENTIAL - Abnormal; Notable for the following:    RBC 3.27 (*)    Hemoglobin 9.8 (*)    HCT 29.0 (*)    All other components within normal limits  POCT I-STAT TROPONIN I   Dg Chest 2 View  10/01/2012  *RADIOLOGY REPORT*  Clinical Data: Chest pain, hypertension, diabetes, breast cancer  CHEST - 2 VIEW  Comparison: 04/01/2012  Findings: Enlargement of cardiac silhouette with pulmonary vascular congestion. Mediastinal contours normal. Peribronchial thickening without infiltrate, failure, or pleural effusion. No pneumothorax or acute osseous findings.  IMPRESSION: Enlargement of cardiac silhouette with pulmonary vascular congestion. Mild bronchitic changes. No acute infiltrate.   Original Report Authenticated By: Ulyses Southward, M.D.     Date: 10/02/2012  Rate: 74  Rhythm: normal sinus rhythm  QRS Axis: left  Intervals: normal  ST/T Wave abnormalities: normal  Conduction Disutrbances:none  Narrative Interpretation: Normal sinus rhythm. Inferior and anterior Q waves. Unchanged from previous.  Old EKG Reviewed: none available  1. Chest pain    MDM   79 year old female who presents to the emergency department with 2 episodes of chest pain in the last 24 hours are described as her anginal equivalent. Patient currently with no chest pain normal vitals. EKG unchanged from  previous. Initial troponin negative. No evidence of PE, dissection, pneumothorax, or other causes of chest pain. And similarity to angina in the past cardiology consultation. Cardiology to admit. Patient is safe for admission chest pain free at time of admission. No other acute concerns on the emergency department.       Arloa Koh, MD 10/02/12 [phone]

## 2012-10-02 ENCOUNTER — Observation Stay (HOSPITAL_COMMUNITY): Payer: Medicare Other

## 2012-10-02 DIAGNOSIS — I10 Essential (primary) hypertension: Secondary | ICD-10-CM | POA: Diagnosis not present

## 2012-10-02 DIAGNOSIS — I2510 Atherosclerotic heart disease of native coronary artery without angina pectoris: Secondary | ICD-10-CM | POA: Diagnosis not present

## 2012-10-02 DIAGNOSIS — E119 Type 2 diabetes mellitus without complications: Secondary | ICD-10-CM | POA: Diagnosis not present

## 2012-10-02 DIAGNOSIS — R079 Chest pain, unspecified: Secondary | ICD-10-CM | POA: Diagnosis not present

## 2012-10-02 DIAGNOSIS — I252 Old myocardial infarction: Secondary | ICD-10-CM | POA: Diagnosis not present

## 2012-10-02 DIAGNOSIS — I200 Unstable angina: Secondary | ICD-10-CM | POA: Diagnosis not present

## 2012-10-02 DIAGNOSIS — E7800 Pure hypercholesterolemia, unspecified: Secondary | ICD-10-CM | POA: Diagnosis not present

## 2012-10-02 LAB — BASIC METABOLIC PANEL
BUN: 19 mg/dL (ref 6–23)
GFR calc Af Amer: 39 mL/min — ABNORMAL LOW (ref >90)
GFR calc non Af Amer: 34 mL/min — ABNORMAL LOW (ref >90)
Potassium: 4.7 mEq/L (ref 3.5–5.1)

## 2012-10-02 LAB — CBC
HCT: 30.7 % — ABNORMAL LOW (ref 36.0–46.0)
MCHC: 34.2 g/dL (ref 30.0–36.0)
Platelets: 260 10*3/uL (ref 150–400)
RDW: 13.7 % (ref 11.5–15.5)

## 2012-10-02 LAB — GLUCOSE, CAPILLARY
Glucose-Capillary: 194 mg/dL — ABNORMAL HIGH (ref 70–99)
Glucose-Capillary: 310 mg/dL — ABNORMAL HIGH (ref 70–99)
Glucose-Capillary: 152 mg/dL — ABNORMAL HIGH (ref 70–99)

## 2012-10-02 LAB — HEMOGLOBIN A1C
Hgb A1c MFr Bld: 11.9 % — ABNORMAL HIGH (ref <5.7)
Mean Plasma Glucose: 295 mg/dL — ABNORMAL HIGH (ref <117)

## 2012-10-02 LAB — LIPID PANEL
LDL Cholesterol: 77 mg/dL (ref 0–99)
Triglycerides: 101 mg/dL (ref <150)

## 2012-10-02 LAB — PROTIME-INR: Prothrombin Time: 13.5 seconds (ref 11.6–15.2)

## 2012-10-02 LAB — TSH: TSH: 2.712 u[IU]/mL (ref 0.350–4.500)

## 2012-10-02 LAB — APTT: aPTT: 30 seconds (ref 24–37)

## 2012-10-02 LAB — TROPONIN I: Troponin I: <0.3 ng/mL (ref <0.30)

## 2012-10-02 MED ORDER — NITROGLYCERIN IN D5W 200-5 MCG/ML-% IV SOLN: 10.0000 ug/min | INTRAVENOUS | Status: DC

## 2012-10-02 MED ORDER — SODIUM CHLORIDE 0.9 % IJ SOLN: 3.0000 mL | INTRAMUSCULAR | Status: DC | PRN

## 2012-10-02 MED ORDER — REGADENOSON 0.4 MG/5ML IV SOLN
INTRAVENOUS | Status: AC
  Administered 2012-10-02: 0.4 mg via INTRAVENOUS
  Filled 2012-10-02: qty 5

## 2012-10-02 MED ORDER — ASPIRIN EC 81 MG PO TBEC
81.0000 mg | DELAYED_RELEASE_TABLET | Freq: Every day | ORAL | Status: DC
  Administered 2012-10-04: 81 mg via ORAL
  Filled 2012-10-02: qty 1

## 2012-10-02 MED ORDER — SODIUM BICARBONATE BOLUS VIA INFUSION
INTRAVENOUS | Status: AC
  Administered 2012-10-03: 10:00:00 via INTRAVENOUS
  Filled 2012-10-02 (×2): qty 1

## 2012-10-02 MED ORDER — SODIUM CHLORIDE 0.9 % IJ SOLN
3.0000 mL | Freq: Two times a day (BID) | INTRAMUSCULAR | Status: DC
  Administered 2012-10-03: 3 mL via INTRAVENOUS

## 2012-10-02 MED ORDER — TECHNETIUM TC 99M SESTAMIBI GENERIC - CARDIOLITE
10.0000 | Freq: Once | INTRAVENOUS | Status: AC | PRN
  Administered 2012-10-02: 10 via INTRAVENOUS

## 2012-10-02 MED ORDER — DIAZEPAM 5 MG PO TABS
5.0000 mg | ORAL_TABLET | ORAL | Status: AC
  Administered 2012-10-03: 5 mg via ORAL
  Filled 2012-10-02: qty 1

## 2012-10-02 MED ORDER — SODIUM CHLORIDE 0.9 % IV SOLN: 250.0000 mL | INTRAVENOUS | Status: DC | PRN

## 2012-10-02 MED ORDER — TECHNETIUM TC 99M SESTAMIBI GENERIC - CARDIOLITE
30.0000 | Freq: Once | INTRAVENOUS | Status: AC | PRN
  Administered 2012-10-02: 30 via INTRAVENOUS

## 2012-10-02 MED ORDER — SODIUM CHLORIDE 0.9 % IV SOLN: INTRAVENOUS | Status: DC

## 2012-10-02 MED ORDER — DEXTROSE 5 % IV SOLN
INTRAVENOUS | Status: AC
  Administered 2012-10-03: 10:00:00 via INTRAVENOUS
  Filled 2012-10-02: qty 1000

## 2012-10-02 MED ORDER — ASPIRIN 81 MG PO CHEW
324.0000 mg | CHEWABLE_TABLET | ORAL | Status: AC
  Administered 2012-10-03: 324 mg via ORAL
  Filled 2012-10-02: qty 4

## 2012-10-02 MED ORDER — REGADENOSON 0.4 MG/5ML IV SOLN: 0.4000 mg | Freq: Once | INTRAVENOUS | Status: AC

## 2012-10-02 NOTE — Progress Notes (Signed)
Subjective:  Patient denies any further chest pains or shortness of breath.  Objective:  Vital Signs in the last 24 hours: Temp:  [98.1 F (36.7 C)-98.2 F (36.8 C)] 98.2 F (36.8 C) (03/11 0500) Pulse Rate:  [58-87] 58 (03/11 0500) Resp:  [16-19] 18 (03/11 0500) BP: (167-189)/(53-75) 167/64 mmHg (03/11 0909) SpO2:  [98 %-100 %] 99 % (03/11 0500) Weight:  [71.6 kg (157 lb 13.6 oz)-71.759 kg (158 lb 3.2 oz)] 71.6 kg (157 lb 13.6 oz) (03/11 0500)  Intake/Output from previous day:   Intake/Output from this shift:    Physical Exam: Neck: no adenopathy, no carotid bruit, no JVD and supple, symmetrical, trachea midline Lungs: clear to auscultation bilaterally Heart: regular rate and rhythm, S1, S2 normal and Soft systolic murmur and S4 gallop noted Abdomen: soft, non-tender; bowel sounds normal; no masses,  no organomegaly Extremities: extremities normal, atraumatic, no cyanosis or edema  Lab Results:  Recent Labs  10/01/12 1634  WBC 7.1  HGB 9.8*  PLT 272    Recent Labs  10/01/12 1634  NA 132*  K 4.7  CL 102  CO2 19  GLUCOSE 370*  BUN 25*  CREATININE 1.89*    Recent Labs  10/01/12 2313  TROPONINI <0.30   Hepatic Function Panel  Recent Labs  10/01/12 1634  PROT 7.6  ALBUMIN 3.1*  AST 16  ALT 12  ALKPHOS 235*  BILITOT 0.2*   No results found for this basename: CHOL,  in the last 72 hours No results found for this basename: PROTIME,  in the last 72 hours  Imaging: Imaging results have been reviewed and Dg Chest 2 View  10/01/2012  *RADIOLOGY REPORT*  Clinical Data: Chest pain, hypertension, diabetes, breast cancer  CHEST - 2 VIEW  Comparison: 04/01/2012  Findings: Enlargement of cardiac silhouette with pulmonary vascular congestion. Mediastinal contours normal. Peribronchial thickening without infiltrate, failure, or pleural effusion. No pneumothorax or acute osseous findings.  IMPRESSION: Enlargement of cardiac silhouette with pulmonary vascular  congestion. Mild bronchitic changes. No acute infiltrate.   Original Report Authenticated By: Ulyses Southward, M.D.     Cardiac Studies:  Assessment/Plan:  Unstable angina MI ruled out   MI ruled outCoronary artery disease history of MI x2 in the past status post multivessel PCI in the past as above  Diffuse multivessel small vessel disease  Hypertension  Insulin requiring diabetes mellitus  Chronic kidney disease stage IV  Dementia  Hypercholesteremia  Anemia of chronic disease  Degenerative joint disease  History of herpes zoster in the past  Plan  continue present management Schedule for nuclear stress test today  LOS: 1 day    HARWANI,MOHAN N 10/02/2012, 9:19 AM

## 2012-10-02 NOTE — Progress Notes (Signed)
Dr Sharyn Lull called about pts BP order received to start Nitro gtt for positive ST. Will continue to monitor.

## 2012-10-03 ENCOUNTER — Encounter (HOSPITAL_COMMUNITY): Admission: EM | Disposition: A | Discharge status: Home / Self Care | Payer: Self-pay | Source: Home / Self Care

## 2012-10-03 DIAGNOSIS — E119 Type 2 diabetes mellitus without complications: Secondary | ICD-10-CM | POA: Diagnosis not present

## 2012-10-03 DIAGNOSIS — I2510 Atherosclerotic heart disease of native coronary artery without angina pectoris: Secondary | ICD-10-CM | POA: Diagnosis not present

## 2012-10-03 DIAGNOSIS — I10 Essential (primary) hypertension: Secondary | ICD-10-CM | POA: Diagnosis not present

## 2012-10-03 DIAGNOSIS — I252 Old myocardial infarction: Secondary | ICD-10-CM | POA: Diagnosis not present

## 2012-10-03 DIAGNOSIS — R079 Chest pain, unspecified: Secondary | ICD-10-CM | POA: Diagnosis not present

## 2012-10-03 DIAGNOSIS — E7800 Pure hypercholesterolemia, unspecified: Secondary | ICD-10-CM | POA: Diagnosis not present

## 2012-10-03 DIAGNOSIS — I200 Unstable angina: Secondary | ICD-10-CM | POA: Diagnosis not present

## 2012-10-03 HISTORY — PX: LEFT HEART CATHETERIZATION WITH CORONARY ANGIOGRAM: SHX5451

## 2012-10-03 LAB — BASIC METABOLIC PANEL
CO2: 21 mEq/L (ref 19–32)
Calcium: 8.3 mg/dL — ABNORMAL LOW (ref 8.4–10.5)
Creatinine, Ser: 1.38 mg/dL — ABNORMAL HIGH (ref 0.50–1.10)
GFR calc Af Amer: 40 mL/min — ABNORMAL LOW (ref >90)
Sodium: 139 mEq/L (ref 135–145)

## 2012-10-03 LAB — POCT I-STAT, CHEM 8
Chloride: 108 mEq/L (ref 96–112)
Glucose, Bld: 169 mg/dL — ABNORMAL HIGH (ref 70–99)
HCT: 25 % — ABNORMAL LOW (ref 36.0–46.0)
Potassium: 3.8 mEq/L (ref 3.5–5.1)

## 2012-10-03 SURGERY — LEFT HEART CATHETERIZATION WITH CORONARY ANGIOGRAM
Anesthesia: LOCAL

## 2012-10-03 MED ORDER — ONDANSETRON HCL 4 MG/2ML IJ SOLN
4.0000 mg | Freq: Four times a day (QID) | INTRAMUSCULAR | Status: DC | PRN
  Administered 2012-10-03: 4 mg via INTRAVENOUS

## 2012-10-03 MED ORDER — NITROGLYCERIN 1 MG/10 ML FOR IR/CATH LAB
INTRA_ARTERIAL | Status: AC
  Filled 2012-10-03: qty 10

## 2012-10-03 MED ORDER — ISOSORBIDE MONONITRATE ER 30 MG PO TB24
30.0000 mg | ORAL_TABLET | Freq: Every day | ORAL | Status: DC
  Administered 2012-10-03 – 2012-10-04 (×2): 30 mg via ORAL
  Filled 2012-10-03 (×2): qty 1

## 2012-10-03 MED ORDER — MIDAZOLAM HCL 2 MG/2ML IJ SOLN
INTRAMUSCULAR | Status: AC
  Filled 2012-10-03: qty 2

## 2012-10-03 MED ORDER — HEPARIN (PORCINE) IN NACL 2-0.9 UNIT/ML-% IJ SOLN
INTRAMUSCULAR | Status: AC
  Filled 2012-10-03: qty 1000

## 2012-10-03 MED ORDER — AMLODIPINE BESYLATE 5 MG PO TABS
5.0000 mg | ORAL_TABLET | Freq: Two times a day (BID) | ORAL | Status: DC
  Administered 2012-10-03 – 2012-10-04 (×2): 5 mg via ORAL
  Filled 2012-10-03 (×5): qty 1

## 2012-10-03 MED ORDER — ONDANSETRON HCL 4 MG/2ML IJ SOLN
INTRAMUSCULAR | Status: AC
  Filled 2012-10-03: qty 2

## 2012-10-03 MED ORDER — ACETAMINOPHEN 325 MG PO TABS: 650.0000 mg | ORAL_TABLET | ORAL | Status: DC | PRN

## 2012-10-03 MED ORDER — HYDRALAZINE HCL 20 MG/ML IJ SOLN
10.0000 mg | Freq: Once | INTRAMUSCULAR | Status: AC
  Administered 2012-10-03: 10 mg via INTRAVENOUS

## 2012-10-03 MED ORDER — SODIUM CHLORIDE 0.9 % IV SOLN: INTRAVENOUS | Status: AC

## 2012-10-03 MED ORDER — LIDOCAINE HCL (PF) 1 % IJ SOLN
INTRAMUSCULAR | Status: AC
  Filled 2012-10-03: qty 30

## 2012-10-03 MED ORDER — HYDRALAZINE HCL 20 MG/ML IJ SOLN
INTRAMUSCULAR | Status: AC
  Filled 2012-10-03: qty 1

## 2012-10-03 MED ORDER — FENTANYL CITRATE 0.05 MG/ML IJ SOLN
INTRAMUSCULAR | Status: AC
  Filled 2012-10-03: qty 2

## 2012-10-03 NOTE — Progress Notes (Signed)
Subjective:  For patient denies any chest pain or shortness of breath. Discussed with patient regarding abnormal stress test and various options of treatment i.e. medical versus left cath possible PTCA stenting its risk and benefits and consents for PCI  Objective:  Vital Signs in the last 24 hours: Temp:  [97.6 F (36.4 C)-99 F (37.2 C)] 97.6 F (36.4 C) (03/12 0500) Pulse Rate:  [55-86] 56 (03/12 0958) Resp:  [16-18] 16 (03/12 0500) BP: (180-196)/(58-77) 183/58 mmHg (03/12 0921) SpO2:  [94 %-97 %] 94 % (03/12 0500) Weight:  [70.943 kg (156 lb 6.4 oz)] 70.943 kg (156 lb 6.4 oz) (03/12 0500)  Intake/Output from previous day: 03/11 0701 - 03/12 0700 In: 600 [I.V.:600] Out: -  Intake/Output from this shift: Total I/O In: 3 [I.V.:3] Out: -   Physical Exam: Neck: no adenopathy, no carotid bruit, no JVD and supple, symmetrical, trachea midline Lungs: clear to auscultation bilaterally Heart: regular rate and rhythm, S1, S2 normal and Soft systolic murmur and S4 gallop noted Abdomen: soft, non-tender; bowel sounds normal; no masses,  no organomegaly Extremities: extremities normal, atraumatic, no cyanosis or edema  Lab Results:  Recent Labs  10/01/12 1634 10/02/12 1300  WBC 7.1 7.7  HGB 9.8* 10.5*  PLT 272 260    Recent Labs  10/02/12 1300 10/03/12 0529  NA 134* 139  K 4.7 3.9  CL 103 108  CO2 21 21  GLUCOSE 222* 111*  BUN 19 20  CREATININE 1.41* 1.38*    Recent Labs  10/01/12 2313 10/02/12 1300  TROPONINI <0.30 <0.30   Hepatic Function Panel  Recent Labs  10/01/12 1634  PROT 7.6  ALBUMIN 3.1*  AST 16  ALT 12  ALKPHOS 235*  BILITOT 0.2*    Recent Labs  10/02/12 1300  CHOL 135   No results found for this basename: PROTIME,  in the last 72 hours  Imaging: Imaging results have been reviewed and Dg Chest 2 View  10/01/2012  *RADIOLOGY REPORT*  Clinical Data: Chest pain, hypertension, diabetes, breast cancer  CHEST - 2 VIEW  Comparison:  04/01/2012  Findings: Enlargement of cardiac silhouette with pulmonary vascular congestion. Mediastinal contours normal. Peribronchial thickening without infiltrate, failure, or pleural effusion. No pneumothorax or acute osseous findings.  IMPRESSION: Enlargement of cardiac silhouette with pulmonary vascular congestion. Mild bronchitic changes. No acute infiltrate.   Original Report Authenticated By: Ulyses Southward, M.D.    Nm Myocar Multi W/spect W/wall Motion / Ef  10/02/2012  *RADIOLOGY REPORT*  Clinical Data:  Chest pain  Technique:  Standard myocardial SPECT imaging performed after resting intravenous injection of 10 mCi Tc-77m sestamibi. Subsequently, intravenous infusion of Lexiscan performed under the supervision of the Cardiology staff.  At peak effect of the drug, 30 mCi Tc-62msestamibi injected intravenously and standard myocardial SPECT imaging performed.  Quantitative gated imaging also performed to evaluate left ventricular wall motion and estimate left ventricular ejection fraction.  Comparison:  None  MYOCARDIAL IMAGING WITH SPECT (REST AND PHARMACOLOGIC-STRESS)  Findings:  There is a fixed defect involving the mid and basilar segments of the inferior wall.  Mild reversibility involves the apical segment of the anterior wall and the apical segments of the lateral wall.  GATED LEFT VENTRICULAR WALL MOTION STUDY  Findings:  Review of the gated images demonstrates normal left ventricular wall motion and thickening.  LEFT VENTRICULAR EJECTION FRACTION  Findings:  QGS ejection fraction measures 68% , with an end- diastolic volume of 67 ml and an end-systolic volume of 21 ml.  IMPRESSION:  1.  Mild reversibility involving the apical portions of the anterior wall and lateral wall. 2.  Normal left ventricular systolic function. 3.  Left ventricular ejection fraction equals 68%.  These results will be called to the ordering clinician or representative by the Radiologist Assistant, and communication documented  in the PACS Dashboard.   Original Report Authenticated By: Signa Kell, M.D.     Cardiac Studies:  Assessment/Plan:  Unstable angina MI ruled out positive nuclear stress test MI ruled outCoronary artery disease history of MI x2 in the past status post multivessel PCI in the past as above  Diffuse multivessel small vessel disease  Hypertension  Insulin requiring diabetes mellitus  Chronic kidney disease stage IV  Dementia  Hypercholesteremia  Anemia of chronic disease  Degenerative joint disease  History of herpes zoster in the past  Plan Discussed with patient regarding left cath possible PTCA stenting its risk and benefits i.e. death MI stroke need for emergency CABG local vascular complications worsening renal function etc. and consents for PCI  LOS: 2 days    HARWANI,MOHAN N 10/03/2012, 11:16 AM

## 2012-10-03 NOTE — CV Procedure (Signed)
Left cardiac cath report dictated on 10/03/2012 dictation number is [id number]

## 2012-10-03 NOTE — ED Provider Notes (Signed)
I saw and evaluated the patient, reviewed the resident's note and I agree with the findings and plan.  I personally reviewed the EKG.   83yF with CP similar to previous angina. EKG stable from previous. On exam NAD. Lungs clear. Heart reg. Discussed with cards. Admit.   Raeford Razor, MD 10/03/12 2141

## 2012-10-04 DIAGNOSIS — E119 Type 2 diabetes mellitus without complications: Secondary | ICD-10-CM | POA: Diagnosis not present

## 2012-10-04 DIAGNOSIS — I10 Essential (primary) hypertension: Secondary | ICD-10-CM | POA: Diagnosis not present

## 2012-10-04 DIAGNOSIS — I252 Old myocardial infarction: Secondary | ICD-10-CM | POA: Diagnosis not present

## 2012-10-04 DIAGNOSIS — I200 Unstable angina: Secondary | ICD-10-CM | POA: Diagnosis not present

## 2012-10-04 DIAGNOSIS — I2510 Atherosclerotic heart disease of native coronary artery without angina pectoris: Secondary | ICD-10-CM | POA: Diagnosis not present

## 2012-10-04 DIAGNOSIS — E7800 Pure hypercholesterolemia, unspecified: Secondary | ICD-10-CM | POA: Diagnosis not present

## 2012-10-04 LAB — BASIC METABOLIC PANEL
BUN: 16 mg/dL (ref 6–23)
Chloride: 105 mEq/L (ref 96–112)
GFR calc Af Amer: 36 mL/min — ABNORMAL LOW (ref >90)
Glucose, Bld: 191 mg/dL — ABNORMAL HIGH (ref 70–99)
Potassium: 4.3 mEq/L (ref 3.5–5.1)

## 2012-10-04 LAB — GLUCOSE, CAPILLARY: Glucose-Capillary: 182 mg/dL — ABNORMAL HIGH (ref 70–99)

## 2012-10-04 MED ORDER — PANTOPRAZOLE SODIUM 40 MG PO TBEC: 40.0000 mg | DELAYED_RELEASE_TABLET | Freq: Every day | ORAL | Status: DC

## 2012-10-04 MED ORDER — LOSARTAN POTASSIUM 50 MG PO TABS: 50.0000 mg | ORAL_TABLET | Freq: Every day | ORAL | Status: DC

## 2012-10-04 MED ORDER — ISOSORBIDE MONONITRATE ER 30 MG PO TB24: 30.0000 mg | ORAL_TABLET | Freq: Every day | ORAL | Status: DC

## 2012-10-04 NOTE — Cardiovascular Report (Signed)
[name], [name]                 ACCOUNT NO.:  [id number]  MEDICAL RECORD NO.:  [id number]  LOCATION:  3W36C                        FACILITY:  [facility]  PHYSICIAN:  Mohan N. Sharyn Lull, M.D. DATE OF BIRTH:  1929/02/02  DATE OF PROCEDURE:  10/03/2012 DATE OF DISCHARGE:                           CARDIAC CATHETERIZATION   PROCEDURE:  Left cardiac catheterization with selective left and right coronary angiography, measurement of LVEDP via right groin using Judkins technique.  INDICATION FOR THE PROCEDURE:  [name] is 79 year old female with past medical history significant for coronary artery disease; history of MI x2 in the past, status post PTCA to RCA, left circumflex, LAD, obtuse marginal, and diagonal 2 in the past; multivessel diffuse coronary artery disease; hypertension; insulin-requiring diabetes mellitus; chronic kidney disease stage IV; hypercholesteremia; history of tobacco abuse in the past; history of syncope in the past; anemia of chronic disease and history of herpes zoster in the past; dementia.  She came to the ER complaining of retrosternal chest pain off and on since last night radiating to right shoulder and arm, grade 4 to 6/10, without associated nausea, vomiting, diaphoresis.  The patient states chest pain similar in nature when she had MI and PCI in the past.  Denies any palpitation, lightheadedness or syncope.  Denies PND, orthopnea, or leg swelling.  The patient also complained of exertional chest pain for last few months, but did not seek any medical attention.  States, her activity is limited.  The patient denies any relation of chest pain to food, breathing, or movement.  The patient was admitted to telemetry unit.  MI was ruled out by serial enzymes and EKG.  The patient was noted to have significantly elevated troponin, significantly elevated creatinine and subsequently underwent nuclear stress test, which showed reversible ischemia in the anteroapical  and lateral wall with normal ejection fraction.  Due to abnormal stress test, recurrent chest pain, multiple risk factors, discussed with the patient regarding left cath, possible PTCA stenting, its risks and benefits, i.e., death, MI, stroke, need for emergency CABG, local vascular complications, etc., and consented for PCI.  PROCEDURE:  After obtaining the informed consent, the patient was brought to the Cath Lab and was placed on fluoroscopy table.  Right groin was prepped and draped in usual fashion.  Xylocaine 2% was used for local anesthesia in the right groin.  With the help of thin wall needle, 5-French arterial sheath was placed.  The sheath was aspirated and flushed.  Next, 5-French left Judkins catheter was advanced over the wire under fluoroscopic guidance up to the ascending aorta.  Wire was pulled out, the catheter was aspirated and connected to the Manifold. Catheter was further advanced and engaged into left coronary ostium. Multiple views of the left system were taken.  Next, the catheter was disengaged and was pulled out over the wire and was replaced with 5- [place] right Judkins catheter, which was advanced over the wire under fluoroscopic guidance up to the ascending aorta.  Wire was pulled out, the catheter was aspirated and connected to the Manifold.  Catheter was further advanced and engaged into right coronary ostium.  Multiple views of the right system were taken.  Next, the catheter was disengaged and was pulled out over the wire and was replaced with 5-French pigtail catheter, which was advanced over the wire under fluoroscopic guidance up to the ascending aorta.  Wire was pulled out, the catheter was aspirated and connected to the Manifold.  Catheter was further advanced across the aortic valve into the LV.  LV pressures were recorded.  Next, left ventriculography was done in 30-degree RAO position.  Post- angiographic pressures were recorded from LV and then  pullback pressures were recorded from the aorta.  There was no gradient across the aortic valve.  Next, the pigtail catheter was advanced across the aortic valve into the LV.  LV pressures were measured, and next, the catheter was pulled out and pressures were measured from LV and aorta.  There was no gradient across the aortic valve.  Next, the pigtail catheter was pulled out over the wire.  Sheaths were aspirated and flushed.  FINDINGS:  LV showed LVEDP of 10 mmHg.  Left main had 50-60% distal stenosis as before.  LAD has 15-20% proximal and 50-60% mid-stenosis. The distal stent is widely patent.  Diagonal 1 is very very small, which is diffusely diseased.  Diagonal 2 has a 70-75% ostial stenosis just before the stent.  Stented segment is widely patent.  Ramus is small, which has 70-75% mid-stenosis right after the bifurcation in the inferior branch.  Left circumflex has 30-40% proximal and distal stenosis.  OM 1 is very very small.  OM 2 has 60-70% mid-stenosis at prior PTCA site.  RCA has 20-30% mid and 30-40% distal stenosis.  PDA has 70-75% proximal and 30-40% distal diffuse stenosis.  Vessel is very small.  PLV branch also has 95% ostial stenosis as before.  The branch is also very small.  The patient tolerated procedure well.  There were no complications.  Plan is to maximize antianginal medication.  We will continue sodium bicarb drip and also saline hydration for the next 6-8 hours.  The patient will be monitored closely.  We will check her renal function tomorrow.  If remains stable, we will discharge in a.m.     Eduardo Osier. Sharyn Lull, M.D.     [facility]/MEDQ  D:  10/03/2012  T:  10/04/2012  Job:  [id number]

## 2012-10-04 NOTE — Progress Notes (Signed)
D/c orders received;IV removed with gauze on, pt remains in stable condition, pt meds and instructions reviewed and given to pt; pt d/c to home 

## 2012-10-04 NOTE — Discharge Summary (Signed)
[name], [name]                 ACCOUNT NO.:  [id number]  MEDICAL RECORD NO.:  [id number]  LOCATION:  [id number]                        FACILITY:  [facility]  PHYSICIAN:  Mohan N. Sharyn Lull, M.D. DATE OF BIRTH:  1929-07-12  DATE OF ADMISSION:  10/01/2012 DATE OF DISCHARGE:  10/04/2012                              DISCHARGE SUMMARY   ADMITTING DIAGNOSES: 1. Unstable angina, rule out myocardial infarction, coronary artery     disease, history of myocardial infarction x2 in the past, status     post multiple PCIs in the past as above. 2. Diffuse multivessel small vessel disease. 3. Hypertension. 4. Insulin-requiring diabetes mellitus. 5. Chronic kidney disease stage IV. 6. Dementia. 7. Hypercholesteremia. 8. Anemia of chronic disease. 9. Degenerative joint disease. 10.History of herpes zoster in the past.  DISCHARGE DIAGNOSES: 1. Stable angina, myocardial infarction ruled out.  Positive Lexiscan     Myoview stress test. 2. Status post cardiac cath. 3. Coronary artery disease, history of myocardial infarction x2 in the     past, status post multivessel PCI as above in the past, stable. 4. Diffuse small-vessel distal RCA and posterolateral branch stenosis     and ostial diagonal 1 stenosis as per cath report. 5. Hypertension. 6. Insulin-requiring diabetes mellitus. 7. Chronic kidney disease stage IV. 8. Dementia. 9. Hypercholesteremia. 10.Anemia of chronic disease. 11.Degenerative joint disease. 12.History of herpes zoster in the past.  DISCHARGE HOME MEDICATIONS:  Enteric-coated aspirin 81 mg 1 tablet daily, clopidogrel 75 mg 1 tablet daily, Imdur 30 mg 1 tablet daily, losartan 50 mg 1 tablet daily, Protonix 40 mg 1 tablet daily, amlodipine 10 mg 1 tablet daily, atorvastatin 20 mg 1 tablet daily, calcium carbonate with vitamin D 1 tablet twice daily as before, Aricept 10 mg 1 tablet daily, Feosol 325 mg daily, Nitrostat 0.4 mg sublingual use as directed, Paxil 10 mg 1 tablet daily,  sodium bicarb 650 mg 1 tablet twice daily, Lantus insulin 20 units at bedtime, and Novolin R and Humulin R 8 units 3 times daily with meal as before.  DIET:  Low salt, low cholesterol, 1800 calories ADA diet.  The patient has been advised to monitor blood sugar twice daily and chart and follow up with PMD regarding blood sugar, and regarding diabetes management. Post cardiac cath instructions have been given.  Follow up with me in 1 week.  CONDITION AT DISCHARGE:  Stable.  BRIEF HISTORY OF PRESENT ILLNESS:  [name] is an 79 year old female with past medical history significant for multiple medical problems i.e., coronary artery disease, history of MI x2 in the past, status post PTCA stenting to RCA in 2000, and subsequently had PTCA stenting to circumflex obtuse marginal LAD and diagonal 2 in the past, multivessel coronary artery disease with diffuse small vessel disease, hypertension, insulin-requiring diabetes mellitus, chronic kidney disease stage IV, hypercholesteremia, history of tobacco abuse in the past, history of syncope in the past, anemia of chronic disease, history of herpes zoster in the past, and dementia.  She came to the ER complaining of retrosternal chest pain off and on since last night radiating to right shoulder and right arm grade 4 to 6/10, without any associated nausea, vomiting, or  diaphoresis.  The patient states chest pain feels similar in nature when she had MI and had PCI.  The patient denies any palpitation, lightheadedness, or syncope.  Denies PND, orthopnea, or leg swelling.  The patient also gives history of exertional chest pain for last few months, but did not seek any medical attention.  States her activity is limited.  Denies any relation of chest pain to food, breathing, or movement.  PHYSICAL EXAMINATION:  GENERAL:  She was alert, awake, and oriented x3. VITAL SIGNS:  Her blood pressure was 189/60, pulse was 80, and temperature was  98.1. HEENT:  Conjunctiva was pink. NECK:  Supple.  No JVD.  No bruit. LUNGS:  Clear to auscultation without rhonchi or rales. CARDIOVASCULAR:  S1 and S2 was normal.  There was soft systolic murmur and S4, gallop. ABDOMEN:  Soft.  Bowel sounds were present.  Nontender. EXTREMITIES:  There was no clubbing, cyanosis, or edema.  LABORATORY DATA:  Sodium was 132, potassium 4.7, BUN 25, creatinine 1.89.  Her blood sugar was 370.  Hemoglobin was 9.8, hematocrit 29.0, white count of 7.1.  Her three sets of cardiac enzymes were negative. Post procedure; sodium is 137, potassium 4.3, BUN 16, creatinine 1.50, glucose is 191.  Lexiscan Myoview showed mild reversible ischemia involving the apical portion of the anterior wall and lateral wall with EF of 68%.  EKG showed sinus bradycardia, old inferior wall MI, and poor R-wave progression in V1 to V3.  BRIEF HOSPITAL COURSE:  The patient was admitted to telemetry unit.  MI was ruled out by serial enzymes and EKG due to the significant renal dysfunction.  The patient initially went for nuclear stress test, which was abnormal as before and subsequently was hydrated and given sodium bicarb drip prior to cardiac catheterization.  The patient subsequently underwent cardiac cath, tolerated procedure well with no complications. The patient did not require any PCI.  The patient tolerated procedure well.  Post procedure, the patient did not have any chest pain.  Her groin is stable with no evidence of hematoma or bruit.  Her renal function is stable.  Creatinine today is 1.5.  The patient is voiding adequately.  The patient has been advised to drink plenty of fluids for next few days.  The patient will be discharged home and will be followed up in my office in 1 week.  If the patient continues to have recurrent chest pain, may consider PCI to distal RCA and posterolateral branch, although those vessels are small and diffusely diseased.  We would prefer  to treat medically.     Eduardo Osier. Sharyn Lull, M.D.     [facility]/MEDQ  D:  10/04/2012  T:  10/04/2012  Job:  [id number]

## 2012-10-04 NOTE — Discharge Summary (Signed)
  Discharge summary dictated on 10/04/2012 dictation number is [id number]

## 2012-10-10 DIAGNOSIS — E7800 Pure hypercholesterolemia, unspecified: Secondary | ICD-10-CM | POA: Diagnosis not present

## 2012-10-10 DIAGNOSIS — I2510 Atherosclerotic heart disease of native coronary artery without angina pectoris: Secondary | ICD-10-CM | POA: Diagnosis not present

## 2012-10-10 DIAGNOSIS — I10 Essential (primary) hypertension: Secondary | ICD-10-CM | POA: Diagnosis not present

## 2012-10-10 DIAGNOSIS — D649 Anemia, unspecified: Secondary | ICD-10-CM | POA: Diagnosis not present

## 2012-10-10 DIAGNOSIS — E119 Type 2 diabetes mellitus without complications: Secondary | ICD-10-CM | POA: Diagnosis not present

## 2012-10-16 ENCOUNTER — Ambulatory Visit: Payer: Medicare Other | Admitting: Internal Medicine

## 2012-10-18 ENCOUNTER — Other Ambulatory Visit: Payer: Self-pay | Admitting: *Deleted

## 2012-10-18 MED ORDER — DONEPEZIL HCL 10 MG PO TABS: 10.0000 mg | ORAL_TABLET | Freq: Every day | ORAL | Status: DC

## 2012-10-18 NOTE — Telephone Encounter (Signed)
Received fax stating pt states donepezil has been increase to 10 mg if correct will need updated script.,,,/lmb

## 2012-10-23 ENCOUNTER — Encounter: Payer: Self-pay | Admitting: Internal Medicine

## 2012-10-23 ENCOUNTER — Ambulatory Visit: Payer: Medicare Other | Admitting: Internal Medicine

## 2012-10-23 ENCOUNTER — Telehealth: Payer: Self-pay | Admitting: *Deleted

## 2012-10-23 ENCOUNTER — Ambulatory Visit (INDEPENDENT_AMBULATORY_CARE_PROVIDER_SITE_OTHER): Payer: Medicare Other | Admitting: Internal Medicine

## 2012-10-23 VITALS — BP 152/70 | HR 78 | Temp 98.9°F | Wt 175.4 lb

## 2012-10-23 DIAGNOSIS — F0390 Unspecified dementia without behavioral disturbance: Secondary | ICD-10-CM | POA: Diagnosis not present

## 2012-10-23 DIAGNOSIS — E1149 Type 2 diabetes mellitus with other diabetic neurological complication: Secondary | ICD-10-CM

## 2012-10-23 DIAGNOSIS — E1165 Type 2 diabetes mellitus with hyperglycemia: Secondary | ICD-10-CM

## 2012-10-23 DIAGNOSIS — E1129 Type 2 diabetes mellitus with other diabetic kidney complication: Secondary | ICD-10-CM | POA: Diagnosis not present

## 2012-10-23 MED ORDER — PREGABALIN 75 MG PO CAPS: 75.0000 mg | ORAL_CAPSULE | Freq: Two times a day (BID) | ORAL | Status: DC

## 2012-10-23 NOTE — Telephone Encounter (Signed)
Left msg with [facility]. Requesting verbal order for bras ect...[name]

## 2012-10-23 NOTE — Assessment & Plan Note (Signed)
Uncontrolled - complicated by renal insufficiency, peripheral neuropathy - we'll start Lyrica for same Compliance complicated by dementia, med confusion and inconsistent caregivers (dtrs: Diane who is more attentive, and Alexis who provides less supervison) Oral meds "stopped" 10/2010 and resumed Lantus after UC eval 10/20/10 - increased dose rx'd 10/2010 Then changed Lantus to [facility] 06/2011 and stopped metformin due to ARI Now back on Lantus after SNF stay 03/2012 - reviewed same with pt/dtr today Working with endo since 08/2012 - med recommended reviewed Pt still living with her dtr diane who helps pt with her meds -  Reviewed importance of med and diet compliance  pt/family to call if sugar over 200 - Lab Results  Component Value Date   HGBA1C 11.9* 10/01/2012

## 2012-10-23 NOTE — Telephone Encounter (Signed)
Received fax order placing on md desk for signature...Raechel Chute

## 2012-10-23 NOTE — Progress Notes (Signed)
  Subjective:    Patient ID: [name], female    DOB: 05-Jul-1929, 79 y.o.   MRN: [id number]  HPI  Here for follow up - reviewed chronic medical issues: Pain in feet "burning", worse at night - Improved with trial Lyrica (took sample from son's supply - 75mg ), ?continue same   Also poor sleep, chronic ?arthritis med - not allowed NSAIDs per renal advice from hosp 09/2012  Past Medical History  Diagnosis Date  . 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 ANEMIA   . DEPRESSION   . HYPERTENSION   . PVD   . INSOMNIA, CHRONIC   . Dementia   . DIABETES MELLITUS, TYPE II     renal, neuro - peripheral neuropathy  . Hypercholesteremia   . Chronic kidney disease (CKD), stage IV (severe)   . Anemia of chronic disease   . Angina   . Non-Q wave myocardial infarction 10/2009    /E-chart  . Inferior MI 2000    "quiet"  . Breast cancer 2000    right     Review of Systems  Constitutional: Negative for fever and unexpected weight change.  Respiratory: Negative for cough, chest tightness and shortness of breath.   Cardiovascular: Negative for chest pain and palpitations.  Genitourinary: Positive for frequency. Negative for urgency and pelvic pain.  Musculoskeletal: Positive for back pain (improved s/p [facility] summer 2013). Negative for joint swelling.  Neurological: Negative for dizziness, facial asymmetry and headaches.       Objective:   Physical Exam  BP 152/70  Pulse 78  Temp(Src) 98.9 F (37.2 C) (Oral)  Wt 175 lb 6.4 oz (79.561 kg)  BMI 31.08 kg/m2  SpO2 96% Wt Readings from Last 3 Encounters:  10/23/12 175 lb 6.4 oz (79.561 kg)  10/03/12 156 lb 6.4 oz (70.943 kg)  10/03/12 156 lb 6.4 oz (70.943 kg)   Constitutional: She appears well-developed and well-nourished. No distress. dtr diane at side Neck: Normal range of motion. Neck supple. No JVD present. No thyromegaly present.  Cardiovascular: Normal rate, regular rhythm and normal heart sounds.  No murmur heard. No BLE  edema. Pulmonary/Chest: Effort normal and breath sounds normal. No respiratory distress. She has no wheezes.  Neurologic: Awake alert and oriented times self, uncertain as to purpose place or time -requires frequent redirection and assistance with recall. poor short term memory recall (0 of 3 at 3 minutes) -no cranial nerve deficits. Walks with the assistance of cane with normal gait and balance Psychiatric: She has a normal mood and affect. Her behavior is normal. Judgment and thought content appropriate but limited insight.   Lab Results  Component Value Date   WBC 7.7 10/02/2012   HGB 8.5* 10/03/2012   HCT 25.0* 10/03/2012   PLT 260 10/02/2012   GLUCOSE 191* 10/04/2012   CHOL 135 10/02/2012   TRIG 101 10/02/2012   HDL 38* 10/02/2012   LDLCALC 77 10/02/2012   ALT 12 10/01/2012   AST 16 10/01/2012   NA 137 10/04/2012   K 4.3 10/04/2012   CL 105 10/04/2012   CREATININE 1.50* 10/04/2012   BUN 16 10/04/2012   CO2 20 10/04/2012   TSH 2.712 10/01/2012   INR 1.04 10/01/2012   HGBA1C 11.9* 10/01/2012   Lab Results  Component Value Date   VITAMINB12 498 07/12/2011       Assessment & Plan:  See problem list. Medications and labs reviewed today.

## 2012-10-23 NOTE — Patient Instructions (Signed)
It was good to see you today. We have reviewed your prior records including labs and tests today Start Lyrica 75mg  2x/day for nerve burning pain - Your prescription(s) have been submitted to your pharmacy. Please take as directed and contact our office if you believe you are having problem(s) with the medication(s). Continue working with other specialists as ongoing Followup in 6 weeks to review, please call sooner if problems or worsening symptoms

## 2012-10-23 NOTE — Assessment & Plan Note (Signed)
Progressive distractibility and forgetfulness -  Prior labs unremarkable -Normal TSH and b12 MRI brain 08/2009 - meningioma and SVD (reveiwed) Lab Results  Component Value Date   VITAMINB12 498 07/12/2011   rx'd aricept  10/2010 but  Did not start until 01/2011 Titrate up dose 07/2012 continue present plan and medications.

## 2012-10-24 NOTE — Telephone Encounter (Signed)
Order faxed to [facility].

## 2012-10-24 NOTE — Telephone Encounter (Signed)
Signed - given to [employer]

## 2012-11-13 ENCOUNTER — Other Ambulatory Visit: Payer: Self-pay | Admitting: Internal Medicine

## 2012-11-13 DIAGNOSIS — Z1231 Encounter for screening mammogram for malignant neoplasm of breast: Secondary | ICD-10-CM

## 2012-11-22 DIAGNOSIS — M48061 Spinal stenosis, lumbar region without neurogenic claudication: Secondary | ICD-10-CM | POA: Diagnosis not present

## 2012-11-22 DIAGNOSIS — M5137 Other intervertebral disc degeneration, lumbosacral region: Secondary | ICD-10-CM | POA: Diagnosis not present

## 2012-11-22 DIAGNOSIS — M47817 Spondylosis without myelopathy or radiculopathy, lumbosacral region: Secondary | ICD-10-CM | POA: Diagnosis not present

## 2012-12-05 ENCOUNTER — Ambulatory Visit: Payer: Medicare Other | Admitting: Internal Medicine

## 2012-12-10 ENCOUNTER — Ambulatory Visit: Payer: Medicare Other | Admitting: Internal Medicine

## 2012-12-18 ENCOUNTER — Ambulatory Visit (HOSPITAL_COMMUNITY)
Admission: RE | Admit: 2012-12-18 | Discharge: 2012-12-18 | Disposition: A | Discharge status: Home / Self Care | Payer: Medicare Other | Source: Ambulatory Visit | Attending: Internal Medicine | Admitting: Internal Medicine

## 2012-12-18 DIAGNOSIS — Z1231 Encounter for screening mammogram for malignant neoplasm of breast: Secondary | ICD-10-CM | POA: Insufficient documentation

## 2012-12-19 ENCOUNTER — Other Ambulatory Visit: Payer: Self-pay | Admitting: Internal Medicine

## 2012-12-21 ENCOUNTER — Other Ambulatory Visit: Payer: Self-pay | Admitting: Internal Medicine

## 2012-12-21 DIAGNOSIS — R928 Other abnormal and inconclusive findings on diagnostic imaging of breast: Secondary | ICD-10-CM

## 2013-01-02 ENCOUNTER — Ambulatory Visit
Admission: RE | Admit: 2013-01-02 | Discharge: 2013-01-02 | Disposition: A | Discharge status: Home / Self Care | Payer: Medicare Other | Source: Ambulatory Visit | Attending: Internal Medicine | Admitting: Internal Medicine

## 2013-01-02 DIAGNOSIS — R928 Other abnormal and inconclusive findings on diagnostic imaging of breast: Secondary | ICD-10-CM | POA: Diagnosis not present

## 2013-01-05 DIAGNOSIS — E109 Type 1 diabetes mellitus without complications: Secondary | ICD-10-CM | POA: Diagnosis not present

## 2013-01-05 DIAGNOSIS — H02409 Unspecified ptosis of unspecified eyelid: Secondary | ICD-10-CM | POA: Diagnosis not present

## 2013-01-07 DIAGNOSIS — I2510 Atherosclerotic heart disease of native coronary artery without angina pectoris: Secondary | ICD-10-CM | POA: Diagnosis not present

## 2013-01-07 DIAGNOSIS — E119 Type 2 diabetes mellitus without complications: Secondary | ICD-10-CM | POA: Diagnosis not present

## 2013-01-07 DIAGNOSIS — I209 Angina pectoris, unspecified: Secondary | ICD-10-CM | POA: Diagnosis not present

## 2013-01-07 DIAGNOSIS — I10 Essential (primary) hypertension: Secondary | ICD-10-CM | POA: Diagnosis not present

## 2013-01-07 DIAGNOSIS — E7800 Pure hypercholesterolemia, unspecified: Secondary | ICD-10-CM | POA: Diagnosis not present

## 2013-01-28 DIAGNOSIS — E119 Type 2 diabetes mellitus without complications: Secondary | ICD-10-CM | POA: Diagnosis not present

## 2013-01-28 DIAGNOSIS — I2510 Atherosclerotic heart disease of native coronary artery without angina pectoris: Secondary | ICD-10-CM | POA: Diagnosis not present

## 2013-01-28 DIAGNOSIS — I200 Unstable angina: Secondary | ICD-10-CM | POA: Diagnosis not present

## 2013-01-28 DIAGNOSIS — E7800 Pure hypercholesterolemia, unspecified: Secondary | ICD-10-CM | POA: Diagnosis not present

## 2013-01-28 DIAGNOSIS — I10 Essential (primary) hypertension: Secondary | ICD-10-CM | POA: Diagnosis not present

## 2013-02-05 ENCOUNTER — Ambulatory Visit (INDEPENDENT_AMBULATORY_CARE_PROVIDER_SITE_OTHER): Payer: Medicare Other | Admitting: Internal Medicine

## 2013-02-05 ENCOUNTER — Encounter: Payer: Self-pay | Admitting: Internal Medicine

## 2013-02-05 ENCOUNTER — Other Ambulatory Visit (INDEPENDENT_AMBULATORY_CARE_PROVIDER_SITE_OTHER): Payer: Medicare Other

## 2013-02-05 VITALS — BP 164/70 | HR 73 | Temp 97.8°F | Wt 181.2 lb

## 2013-02-05 DIAGNOSIS — I509 Heart failure, unspecified: Secondary | ICD-10-CM

## 2013-02-05 DIAGNOSIS — R609 Edema, unspecified: Secondary | ICD-10-CM

## 2013-02-05 LAB — BASIC METABOLIC PANEL
BUN: 27 mg/dL — ABNORMAL HIGH (ref 6–23)
Calcium: 8.5 mg/dL (ref 8.4–10.5)
GFR: 30.67 mL/min — ABNORMAL LOW (ref >60.00)
Glucose, Bld: 310 mg/dL — ABNORMAL HIGH (ref 70–99)

## 2013-02-05 MED ORDER — FUROSEMIDE 40 MG PO TABS: 40.0000 mg | ORAL_TABLET | Freq: Two times a day (BID) | ORAL | Status: DC

## 2013-02-05 MED ORDER — AMLODIPINE BESYLATE 5 MG PO TABS: 5.0000 mg | ORAL_TABLET | Freq: Every day | ORAL | Status: DC

## 2013-02-05 NOTE — Progress Notes (Signed)
Subjective:    Patient ID: [name], female    DOB: 1928/12/31, 79 y.o.   MRN: [id number]  Leg Pain  The incident occurred more than 1 week ago. There was no injury mechanism. The pain is present in the left ankle, right ankle, right leg and left leg. The quality of the pain is described as aching. The pain is moderate. The pain has been constant since onset. Pertinent negatives include no inability to bear weight, loss of motion, loss of sensation, muscle weakness, numbness or tingling. Associated symptoms comments: edema. She reports no foreign bodies present. She has tried elevation for the symptoms. The treatment provided no relief.   also reviewed chronic medical issues:  Past Medical History  Diagnosis Date  . 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 ANEMIA   . DEPRESSION   . HYPERTENSION   . PVD   . INSOMNIA, CHRONIC   . Dementia   . DIABETES MELLITUS, TYPE II     renal, neuro - peripheral neuropathy  . Hypercholesteremia   . Chronic kidney disease (CKD), stage IV (severe)   . Anemia of chronic disease   . Angina   . Non-Q wave myocardial infarction 10/2009    /E-chart  . Inferior MI 2000    "quiet"  . Breast cancer 2000    right     Review of Systems  Constitutional: Negative for fever and unexpected weight change.  Respiratory: Positive for shortness of breath. Negative for cough and chest tightness.   Cardiovascular: Positive for leg swelling. Negative for chest pain and palpitations.  Genitourinary: Positive for difficulty urinating. Negative for urgency and frequency.  Musculoskeletal: Positive for back pain (improved s/p [facility] summer 2013). Negative for joint swelling.  Neurological: Negative for dizziness, tingling, facial asymmetry, numbness and headaches.       Objective:   Physical Exam  BP 164/70  Pulse 73  Temp(Src) 97.8 F (36.6 C) (Oral)  Wt 181 lb 3.2 oz (82.192 kg)  BMI 32.11 kg/m2  SpO2 93% Wt Readings from Last 3 Encounters:  02/05/13  181 lb 3.2 oz (82.192 kg)  10/23/12 175 lb 6.4 oz (79.561 kg)  10/03/12 156 lb 6.4 oz (70.943 kg)   Constitutional: She appears well-developed and well-nourished. No distress. dtr diane at side Neck: Normal range of motion. Neck supple. No JVD present. No thyromegaly present.  Cardiovascular: Normal rate, regular rhythm and normal heart sounds.  No murmur heard. 2+ distal pitting BLE edema. Pulmonary/Chest: Effort normal and breath sounds normal. No respiratory distress. She has no wheezes.  Neurologic: Awake alert and oriented times self, uncertain as to purpose place or time -requires frequent redirection and assistance with recall. poor short term memory recall (0 of 3 at 3 minutes) -no cranial nerve deficits. Walks with the assistance of cane with normal gait and balance Psychiatric: She has a normal mood and affect. Her behavior is normal. Judgment and thought content appropriate but limited insight.   Lab Results  Component Value Date   WBC 7.7 10/02/2012   HGB 8.5* 10/03/2012   HCT 25.0* 10/03/2012   PLT 260 10/02/2012   GLUCOSE 191* 10/04/2012   CHOL 135 10/02/2012   TRIG 101 10/02/2012   HDL 38* 10/02/2012   LDLCALC 77 10/02/2012   ALT 12 10/01/2012   AST 16 10/01/2012   NA 137 10/04/2012   K 4.3 10/04/2012   CL 105 10/04/2012   CREATININE 1.50* 10/04/2012   BUN 16 10/04/2012  CO2 20 10/04/2012   TSH 2.712 10/01/2012   INR 1.04 10/01/2012   HGBA1C 11.9* 10/01/2012   Lab Results  Component Value Date   VITAMINB12 498 07/12/2011       Assessment & Plan:   BLE edema with volume overload/weight increase Concerning for CHF Begun on lasix 20mg  qd last week by cards (not note available in EMR) - edema not improved Family denies Echo done in past 6 months - will order now Also check BMet given hx CKD -- ?progressive - may need renal follow up before 03/2013 as planned Increase lasix to 40mg  bid and reduce amlodipine to 5mg  qd Instructed to weight daily at home and call if weight gain  >3# follow up 1 week to review, sooner if problems  See problem list. Medications and labs reviewed today.

## 2013-02-05 NOTE — Patient Instructions (Signed)
It was good to see you today. We have reviewed your prior records including labs and tests today Test(s) ordered today. Your results will be released to MyChart (or called to you) after review, usually within 72hours after test completion. If any changes need to be made, you will be notified at that same time. Medications reviewed and updated, increase dose furosemide to 40mg  2x/day and decrease amlodipine to 5g daily - no other changes recommended at this time. Your prescription(s) have been submitted to your pharmacy. Please take as directed and contact our office if you believe you are having problem(s) with the medication(s). we'll make referral for ultrasound of heart. Our office will contact you regarding appointment(s) once made. Please schedule followup in 1 week, call sooner if problems.

## 2013-02-11 ENCOUNTER — Other Ambulatory Visit (HOSPITAL_COMMUNITY): Payer: Medicare Other

## 2013-02-12 ENCOUNTER — Other Ambulatory Visit (INDEPENDENT_AMBULATORY_CARE_PROVIDER_SITE_OTHER): Payer: Medicare Other

## 2013-02-12 ENCOUNTER — Ambulatory Visit (INDEPENDENT_AMBULATORY_CARE_PROVIDER_SITE_OTHER): Payer: Medicare Other | Admitting: Internal Medicine

## 2013-02-12 ENCOUNTER — Encounter: Payer: Self-pay | Admitting: Internal Medicine

## 2013-02-12 VITALS — BP 158/70 | HR 73 | Temp 98.7°F | Wt 172.6 lb

## 2013-02-12 DIAGNOSIS — E1129 Type 2 diabetes mellitus with other diabetic kidney complication: Secondary | ICD-10-CM | POA: Diagnosis not present

## 2013-02-12 DIAGNOSIS — I10 Essential (primary) hypertension: Secondary | ICD-10-CM

## 2013-02-12 DIAGNOSIS — E1165 Type 2 diabetes mellitus with hyperglycemia: Secondary | ICD-10-CM

## 2013-02-12 DIAGNOSIS — R609 Edema, unspecified: Secondary | ICD-10-CM

## 2013-02-12 LAB — BASIC METABOLIC PANEL
CO2: 25 mEq/L (ref 19–32)
Calcium: 9.1 mg/dL (ref 8.4–10.5)
Creatinine, Ser: 2.2 mg/dL — ABNORMAL HIGH (ref 0.4–1.2)

## 2013-02-12 LAB — HEMOGLOBIN A1C: Hgb A1c MFr Bld: 11.1 % — ABNORMAL HIGH (ref 4.6–6.5)

## 2013-02-12 NOTE — Assessment & Plan Note (Signed)
Started on bicarbonate for mild acidosis September 2013 hospitalization during hyperosmolar coma Progressive decline reviewed - follow up 03/2013 planned Hyman Hopes) Recheck basic metabolic now - monitor on increased diuretic dose Patient to remain off of renal toxic meds such as ACE inhibitors and metformin

## 2013-02-12 NOTE — Assessment & Plan Note (Signed)
Uncontrolled - complicated by renal insufficiency, peripheral neuropathy - we'll start Lyrica for same Compliance complicated by dementia, med confusion and inconsistent caregivers (dtrs: Diane who is more attentive, and Alexis who provides less supervison) Oral meds "stopped" 10/2010 and resumed Lantus after UC eval 10/20/10 - increased dose rx'd 10/2010 Then changed Lantus to [facility] 06/2011 and stopped metformin due to ARI Now back on Lantus after SNF stay 03/2012 - reviewed same with pt/dtr today Working with endo since 08/2012 - med recommended reviewed Pt still living with her dtr diane who helps pt with her meds -  Reviewed importance of med and diet compliance  pt/family to call if sugar over 200 - Lab Results  Component Value Date   HGBA1C 11.9* 10/01/2012

## 2013-02-12 NOTE — Patient Instructions (Signed)
It was good to see you today. We have reviewed your prior records including labs and tests today Test(s) ordered today. Your results will be released to MyChart (or called to you) after review, usually within 72hours after test completion. If any changes need to be made, you will be notified at that same time. Medications reviewed and updated, no changes recommended at this time. Keep appointment with Dr Hyman Hopes (kidney specialist) as planned Sept 10 - will call if we need this to be sooner Please schedule followup in 3-4 months, call sooner if problems.

## 2013-02-12 NOTE — Progress Notes (Signed)
  Subjective:    Patient ID: [name], female    DOB: 1928/10/29, 79 y.o.   MRN: [id number]  HPI Here for follow up edema -  Also reviewed chronic medical issues:  Past Medical History  Diagnosis Date  . 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 ANEMIA   . DEPRESSION   . HYPERTENSION   . PVD   . INSOMNIA, CHRONIC   . Dementia   . DIABETES MELLITUS, TYPE II     renal, neuro - peripheral neuropathy  . Hypercholesteremia   . Chronic kidney disease (CKD), stage IV (severe)   . Anemia of chronic disease   . Angina   . Non-Q wave myocardial infarction 10/2009    /E-chart  . Inferior MI 2000    "quiet"  . Breast cancer 2000    right     Review of Systems  Respiratory: Negative for cough, chest tightness and shortness of breath.   Cardiovascular: Negative for chest pain, palpitations and leg swelling.  Musculoskeletal: Positive for back pain (improved s/p [facility] summer 2013). Negative for joint swelling.  Neurological: Negative for tingling.       Objective:   Physical Exam  BP 158/70  Pulse 73  Temp(Src) 98.7 F (37.1 C) (Oral)  Wt 172 lb 9.6 oz (78.291 kg)  BMI 30.58 kg/m2  SpO2 96% Wt Readings from Last 3 Encounters:  02/12/13 172 lb 9.6 oz (78.291 kg)  02/05/13 181 lb 3.2 oz (82.192 kg)  10/23/12 175 lb 6.4 oz (79.561 kg)   Constitutional: She appears well-developed and well-nourished. No distress. dtr diane at side Neck: Normal range of motion. Neck supple. No JVD present. No thyromegaly present.  Cardiovascular: Normal rate, regular rhythm and normal heart sounds.  No murmur heard. 1+ distal LLE edema, trace on RLE (much improved). Pulmonary/Chest: Effort normal and breath sounds normal. No respiratory distress. She has no wheezes.  Psychiatric: She has a normal mood and affect. Her behavior is normal. Judgment and thought content appropriate but limited insight.   Lab Results  Component Value Date   WBC 7.7 10/02/2012   HGB 8.5* 10/03/2012   HCT 25.0*  10/03/2012   PLT 260 10/02/2012   GLUCOSE 310* 02/05/2013   CHOL 135 10/02/2012   TRIG 101 10/02/2012   HDL 38* 10/02/2012   LDLCALC 77 10/02/2012   ALT 12 10/01/2012   AST 16 10/01/2012   NA 136 02/05/2013   K 5.2* 02/05/2013   CL 111 02/05/2013   CREATININE 2.0* 02/05/2013   BUN 27* 02/05/2013   CO2 21 02/05/2013   TSH 2.712 10/01/2012   INR 1.04 10/01/2012   HGBA1C 11.9* 10/01/2012   Lab Results  Component Value Date   VITAMINB12 498 07/12/2011       Assessment & Plan:   BLE edema with volume overload/weight increase - OV last week for same reviewed Improved in last 7 days with increase Lasix dose and less amlodipine follow up for echo tomorrow as scheduled to eval for ? CHF Also recheck BMet to monitor CKD -- ?progressive - may need renal follow up before 03/2013 as planned  See problem list. Medications and labs reviewed today.

## 2013-02-12 NOTE — Assessment & Plan Note (Signed)
BP Readings from Last 3 Encounters:  02/12/13 158/70  02/05/13 164/70  10/23/12 152/70   Reduced dose of amlodipine July 2014 because of edema No significant change in blood pressure control following same Reviewed importance of medication compliance Daughter Lafonda Mosses agrees to monitor same

## 2013-02-13 ENCOUNTER — Encounter: Payer: Self-pay | Admitting: Internal Medicine

## 2013-02-13 ENCOUNTER — Other Ambulatory Visit: Payer: Self-pay | Admitting: Internal Medicine

## 2013-02-13 ENCOUNTER — Other Ambulatory Visit (HOSPITAL_COMMUNITY): Payer: Medicare Other

## 2013-02-13 NOTE — Addendum Note (Signed)
Addended by: Rene Paci A on: 02/13/2013 05:31 PM   Modules accepted: Orders

## 2013-02-14 NOTE — Telephone Encounter (Signed)
Faxed script back to physician pharmacy.../lmb 

## 2013-02-18 ENCOUNTER — Ambulatory Visit (INDEPENDENT_AMBULATORY_CARE_PROVIDER_SITE_OTHER): Payer: Medicare Other | Admitting: Internal Medicine

## 2013-02-18 ENCOUNTER — Encounter: Payer: Self-pay | Admitting: Internal Medicine

## 2013-02-18 VITALS — BP 112/58 | HR 83 | Temp 98.5°F | Resp 12 | Ht 63.0 in | Wt 174.0 lb

## 2013-02-18 DIAGNOSIS — E1165 Type 2 diabetes mellitus with hyperglycemia: Secondary | ICD-10-CM | POA: Diagnosis not present

## 2013-02-18 DIAGNOSIS — E1129 Type 2 diabetes mellitus with other diabetic kidney complication: Secondary | ICD-10-CM | POA: Diagnosis not present

## 2013-02-18 NOTE — Patient Instructions (Signed)
Increase Lantus to 23 units. Please call in 7-10 days with sugars - you can fax the sugar log at [phone] - attn: Dr. Elvera Lennox. Please return in 1 month with your sugar log.

## 2013-02-18 NOTE — Progress Notes (Signed)
Patient ID: [name], female   DOB: October 17, 1928, 79 y.o.   MRN: [id number]  Subjective:   HPI [name] is an 79 year old woman, returning for management of DM2, dx 1990, insulin-dependent, uncontrolled, with complications (CAD-status post MI in 2000 and 2011, PVD, CKD stage IV, peripheral neuropathy). She is here accompanied by her son, but it is her daughter [name]) that lives with her, checks her sugars, and gives her insulin. The daughter works nights. I did not see pt in 5 months, although at last visit, I advised her to return in 1 mo.   Her last hemoglobin A1c was: Lab Results  Component Value Date   HGBA1C 11.1* 02/12/2013  Previously: 11.9%, prev. 14.3% (08/24/2012).  Bariers to proper treatment are: decreased compliance due to dementia, mental confusion, and different caregivers (her daughters [name] and [name]).  She is on: - Lantus 20 units - cannot remember if she takes this  - Regular 8 units tid ac (switched from NovoLog at last visit b/c her grazing habit)   Patient checks her sugars once time a day (no log).  - am: 150-400  (but mostly in the 200s) >> 100s, but since recently started on Lasix/Amlodipine/Imdur: 200-300  She has hypoglycemia awareness, but unclear at what value. Lowest blood sugar: they cannot tell me, but apparently no lows. The daughter was telling me at last visit that the patient grazes all the time, between meals.   - She has chronic kidney disease, with her last BUN/creatinine: Lab Results  Component Value Date   BUN 25* 02/12/2013   Lab Results  Component Value Date   CREATININE 2.2* 02/12/2013   - She also has coronary artery disease status post 2 MIs and PVD.  - She sees her eye dr. but not every year. Last visit this year. She had cataract sx in the past. The daughter tells me that they would schedule a new appointment.  - She has numbness and tingling in her legs and also burning R>L.    I reviewed pt's medications, allergies, PMH, social  hx, family hx and no changes required except as above. Lasix started last week for leg swelling >> pain in feet, hard to walk. Patient has chest pain and shortness of breath, but the son tells me that this is not new, and patient's cardiologist is aware of this.  Review of Systems Constitutional: no weight gain or loss, + fatigue, decreased appetite. Denies increased thirst and urination. Poor sleep. Nocturia. Eyes: no blurry vision, no xerophthalmia ENT: no sore throat, no nodules palpated in throat, no dysphagia/odynophagia, no hoarseness Cardiovascular: + CP/+ SOB/palpitations/+ leg swelling Respiratory: + cough/+ SOB Gastrointestinal: no N/V/D/C Musculoskeletal: no muscle/+ joint aches Skin: no rashes, + itching  Objective:   Physical Exam BP 112/58  Pulse 83  Temp(Src) 98.5 F (36.9 C) (Oral)  Resp 12  Ht 5\' 3"  (1.6 m)  Wt 174 lb (78.926 kg)  BMI 30.83 kg/m2  SpO2 95% Wt Readings from Last 3 Encounters:  02/18/13 174 lb (78.926 kg)  02/12/13 172 lb 9.6 oz (78.291 kg)  02/05/13 181 lb 3.2 oz (82.192 kg)   Constitutional: overweight, in NAD Eyes: PERRLA, EOMI, no exophthalmos ENT: moist mucous membranes, no thyromegaly, no cervical lymphadenopathy Cardiovascular: RRR, No MRG Respiratory: CTA B Gastrointestinal: abdomen soft, NT, ND, BS+ Musculoskeletal: no deformities, strength intact in all 4 Skin: moist, warm, left lateral periankle dark discoloration and small abrasion - chronic. Periankle edema bilaterally.  Foot exam performed today    Assessment:  1. DM2, insulin-dependent, uncontrolled, with complications - CAD-status post MI in 2000 and 2011 - PVD - CKD stage IV - Peripheral neuropathy  Plan:     1. The patient has a long history of DM 2, and has chronic kidney disease, which limits our treatment choices to insulin. He returns after an absence of 5 months. At this visit, she comes with her son, but is not aware about her sugar values or her diabetes  regimen. Having dementia, she's not aware of these either. During the visit, they called patient's daughter, and we found out that her sugars greatly increased from 100s to 200s and even 300s after addition of Lasix, Norvasc and Imdur. The patient mentions that she does not feel well, and she has pain in the dorsal parts of her feet. Her legs remain swollen.  The patient's daughter confirmed that she is giving the patient 20 units of Lantus at night and 8 units of regular insulin with each meal. - in the absence of any available sugars or more information about possible lows or highs, I advised them to remain on 8 units of regular insulin 30 minutes before the 3 meals - I also would like to increase Lantus to 23 units at night - No labs today - I will see the patient back in one month with her log, but I get a new log and I advised them to fax them to me in 7-10 days to see if we can change the regimen at that time

## 2013-02-20 ENCOUNTER — Ambulatory Visit (HOSPITAL_COMMUNITY): Payer: Medicare Other | Attending: Internal Medicine | Admitting: Radiology

## 2013-02-20 DIAGNOSIS — N189 Chronic kidney disease, unspecified: Secondary | ICD-10-CM | POA: Diagnosis not present

## 2013-02-20 DIAGNOSIS — Z87891 Personal history of nicotine dependence: Secondary | ICD-10-CM | POA: Insufficient documentation

## 2013-02-20 DIAGNOSIS — R609 Edema, unspecified: Secondary | ICD-10-CM | POA: Diagnosis not present

## 2013-02-20 DIAGNOSIS — I739 Peripheral vascular disease, unspecified: Secondary | ICD-10-CM | POA: Diagnosis not present

## 2013-02-20 DIAGNOSIS — E785 Hyperlipidemia, unspecified: Secondary | ICD-10-CM | POA: Insufficient documentation

## 2013-02-20 DIAGNOSIS — E119 Type 2 diabetes mellitus without complications: Secondary | ICD-10-CM | POA: Insufficient documentation

## 2013-02-20 DIAGNOSIS — I129 Hypertensive chronic kidney disease with stage 1 through stage 4 chronic kidney disease, or unspecified chronic kidney disease: Secondary | ICD-10-CM | POA: Diagnosis not present

## 2013-02-20 DIAGNOSIS — I2510 Atherosclerotic heart disease of native coronary artery without angina pectoris: Secondary | ICD-10-CM | POA: Diagnosis not present

## 2013-02-20 DIAGNOSIS — I509 Heart failure, unspecified: Secondary | ICD-10-CM

## 2013-02-20 NOTE — Progress Notes (Signed)
Echocardiogram performed.  

## 2013-02-26 ENCOUNTER — Ambulatory Visit: Payer: Medicare Other | Admitting: Internal Medicine

## 2013-02-27 ENCOUNTER — Ambulatory Visit: Payer: Medicare Other | Admitting: Internal Medicine

## 2013-03-04 ENCOUNTER — Encounter: Payer: Self-pay | Admitting: Internal Medicine

## 2013-03-04 ENCOUNTER — Other Ambulatory Visit (INDEPENDENT_AMBULATORY_CARE_PROVIDER_SITE_OTHER): Payer: Medicare Other

## 2013-03-04 ENCOUNTER — Ambulatory Visit (INDEPENDENT_AMBULATORY_CARE_PROVIDER_SITE_OTHER): Payer: Medicare Other | Admitting: Internal Medicine

## 2013-03-04 VITALS — BP 148/62 | HR 76 | Temp 97.6°F | Wt 174.8 lb

## 2013-03-04 DIAGNOSIS — R609 Edema, unspecified: Secondary | ICD-10-CM | POA: Diagnosis not present

## 2013-03-04 DIAGNOSIS — E1165 Type 2 diabetes mellitus with hyperglycemia: Secondary | ICD-10-CM | POA: Diagnosis not present

## 2013-03-04 DIAGNOSIS — E1129 Type 2 diabetes mellitus with other diabetic kidney complication: Secondary | ICD-10-CM

## 2013-03-04 DIAGNOSIS — N184 Chronic kidney disease, stage 4 (severe): Secondary | ICD-10-CM

## 2013-03-04 LAB — BASIC METABOLIC PANEL
Calcium: 8.8 mg/dL (ref 8.4–10.5)
GFR: 29.64 mL/min — ABNORMAL LOW (ref >60.00)
Glucose, Bld: 311 mg/dL — ABNORMAL HIGH (ref 70–99)
Sodium: 137 mEq/L (ref 135–145)

## 2013-03-04 MED ORDER — INSULIN GLARGINE 100 UNIT/ML ~~LOC~~ SOLN: 24.0000 [IU] | Freq: Every day | SUBCUTANEOUS | Status: DC

## 2013-03-04 NOTE — Progress Notes (Signed)
  Subjective:    Patient ID: [name], female    DOB: [birth year], 79 y.o.   MRN: [id number]  HPI  Here for follow up edema - generally improved Also concerned about cbgs >250, ?related to lasix, imdur and amlodipine? Also reviewed chronic medical issues: Confusion persists re: medications  Past Medical History  Diagnosis Date  . 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 ANEMIA   . DEPRESSION   . HYPERTENSION   . PVD   . INSOMNIA, CHRONIC   . Dementia   . DIABETES MELLITUS, TYPE II     renal, neuro - peripheral neuropathy  . Hypercholesteremia   . Chronic kidney disease (CKD), stage IV (severe)   . Anemia of chronic disease   . Angina   . Non-Q wave myocardial infarction 10/2009    /E-chart  . Inferior MI 2000    "quiet"  . Breast cancer 2000    right     Review of Systems  Respiratory: Negative for cough, chest tightness and shortness of breath.   Cardiovascular: Negative for chest pain, palpitations and leg swelling.  Musculoskeletal: Positive for back pain (improved s/p [facility] summer 2013). Negative for joint swelling.  Neurological: Negative for tingling.       Objective:   Physical Exam  BP 148/62  Pulse 76  Temp(Src) 97.6 F (36.4 C) (Oral)  Wt 174 lb 12.8 oz (79.289 kg)  BMI 30.97 kg/m2  SpO2 94% Wt Readings from Last 3 Encounters:  03/04/13 174 lb 12.8 oz (79.289 kg)  02/18/13 174 lb (78.926 kg)  02/12/13 172 lb 9.6 oz (78.291 kg)   Constitutional: She appears well-developed and well-nourished. No distress. youngest dtr at side Neck: Normal range of motion. Neck supple. No JVD present. No thyromegaly present.  Cardiovascular: Normal rate, regular rhythm and normal heart sounds.  No murmur heard. 1+ distal LLE edema, trace on RLE (much improved). Pulmonary/Chest: Effort normal and breath sounds normal. No respiratory distress. She has no wheezes.  Psychiatric: She has a normal mood and affect. Her behavior is normal. Judgment and thought content  appropriate but limited insight.   Lab Results  Component Value Date   WBC 7.7 10/02/2012   HGB 8.5* 10/03/2012   HCT 25.0* 10/03/2012   PLT 260 10/02/2012   GLUCOSE 259* 02/12/2013   CHOL 135 10/02/2012   TRIG 101 10/02/2012   HDL 38* 10/02/2012   LDLCALC 77 10/02/2012   ALT 12 10/01/2012   AST 16 10/01/2012   NA 137 02/12/2013   K 5.4* 02/12/2013   CL 106 02/12/2013   CREATININE 2.2* 02/12/2013   BUN 25* 02/12/2013   CO2 25 02/12/2013   TSH 2.712 10/01/2012   INR 1.04 10/01/2012   HGBA1C 11.1* 02/12/2013   Lab Results  Component Value Date   VITAMINB12 498 07/12/2011   2d echo 02/20/13: normal LV function at 55-60% EF, but marked LV wall thickening consistent with severe LVH     Assessment & Plan:    See problem list. Medications and labs reviewed today.

## 2013-03-04 NOTE — Assessment & Plan Note (Signed)
Previously on bicarbonate for mild acidosis September 2013 hospitalization during hyperosmolar coma Progressive decline reviewed - follow up 03/2013 planned Hyman Hopes) Recheck basic metabolic now - monitor on increased diuretic dose (increased 01/2013) Patient to remain off of renal toxic meds such as ACE inhibitors and metformin

## 2013-03-04 NOTE — Patient Instructions (Signed)
It was good to see you today. We have reviewed your prior records including labs and tests today Test(s) ordered today. Your results will be released to MyChart (or called to you) after review, usually within 72hours after test completion. If any changes need to be made, you will be notified at that same time. Medications reviewed and updated, take Lantus 24Units at bedtime and Regular Insulin 8 units before each meal 3 times daily -no other changes recommended Keep appointment with Dr Hyman Hopes (kidney specialist) as planned Sept 10 - will call if we need this to be sooner Please schedule followup in 3-4 months, call sooner if problems.

## 2013-03-04 NOTE — Assessment & Plan Note (Signed)
BLE edema with volume overload/weight increase since 01/2013 Improved in last few weeks with increase Lasix dose and less amlodipine 02/20/13 2D echo: normal LVEF 55%, LVH changes Suspect related to CKD -- ?progressive - keep renal follow Dr Hyman Hopes 03/2013 as planned

## 2013-03-04 NOTE — Assessment & Plan Note (Signed)
Uncontrolled - complicated by renal insufficiency, peripheral neuropathy (on Lyrica for same) Compliance complicated by dementia, med confusion and inconsistent caregivers (dtrs: Diane who is more attentive, and Alexis who provides less supervison) Oral meds "stopped" 10/2010 and resumed Lantus after UC eval 10/20/10 - increased dose rx'd 10/2010 Then changed Lantus to [facility] 06/2011 and stopped metformin due to ARI Now back on Lantus after SNF stay 03/2012 - reviewed same and dosing recs with pt/dtr today Working with endo since 08/2012 - med recommended reviewed from last OV Pt still living with her dtr diane who helps pt with her meds -  Reviewed importance of med and diet compliance  pt/family to call if sugar over 200 - Lab Results  Component Value Date   HGBA1C 11.1* 02/12/2013

## 2013-03-21 ENCOUNTER — Ambulatory Visit: Payer: Medicare Other | Admitting: Internal Medicine

## 2013-04-10 ENCOUNTER — Other Ambulatory Visit: Payer: Self-pay | Admitting: Internal Medicine

## 2013-04-17 ENCOUNTER — Ambulatory Visit (INDEPENDENT_AMBULATORY_CARE_PROVIDER_SITE_OTHER): Payer: Medicare Other | Admitting: Internal Medicine

## 2013-04-17 ENCOUNTER — Encounter: Payer: Self-pay | Admitting: Internal Medicine

## 2013-04-17 VITALS — BP 150/62 | HR 78 | Temp 98.9°F | Wt 177.1 lb

## 2013-04-17 DIAGNOSIS — Z23 Encounter for immunization: Secondary | ICD-10-CM | POA: Diagnosis not present

## 2013-04-17 DIAGNOSIS — E1129 Type 2 diabetes mellitus with other diabetic kidney complication: Secondary | ICD-10-CM

## 2013-04-17 DIAGNOSIS — N644 Mastodynia: Secondary | ICD-10-CM

## 2013-04-17 DIAGNOSIS — C50919 Malignant neoplasm of unspecified site of unspecified female breast: Secondary | ICD-10-CM | POA: Insufficient documentation

## 2013-04-17 DIAGNOSIS — C50911 Malignant neoplasm of unspecified site of right female breast: Secondary | ICD-10-CM

## 2013-04-17 MED ORDER — HYDROXYZINE HCL 10 MG PO TABS: 10.0000 mg | ORAL_TABLET | Freq: Three times a day (TID) | ORAL | Status: DC | PRN

## 2013-04-17 NOTE — Progress Notes (Signed)
  Subjective:    Patient ID: [name], female    DOB: [birth year], 79 y.o.   MRN: [id number]  HPI Here for problems with right breast: itching and discomfort Denies rash, medication changes, discharge or exposure Ongoing symptoms for 3-4 weeks, progressively worse Concerned because of prior breast cancer in same breast   Past Medical History  Diagnosis Date  . 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 ANEMIA   . DEPRESSION   . HYPERTENSION   . PVD   . INSOMNIA, CHRONIC   . Dementia   . DIABETES MELLITUS, TYPE II     renal, neuro - peripheral neuropathy  . Hypercholesteremia   . Chronic kidney disease (CKD), stage IV (severe)   . Anemia of chronic disease   . Angina   . Non-Q wave myocardial infarction 10/2009    /E-chart  . Inferior MI 2000    "quiet"  . Breast cancer 2000    right     Review of Systems  Respiratory: Negative for cough, chest tightness and shortness of breath.   Cardiovascular: Negative for chest pain, palpitations and leg swelling.  Musculoskeletal: Positive for back pain (improved s/p [facility] summer 2013). Negative for joint swelling.  Neurological: Negative for tingling.       Objective:   Physical Exam BP 150/62  Pulse 78  Temp(Src) 98.9 F (37.2 C) (Oral)  Wt 177 lb 1.9 oz (80.341 kg)  BMI 31.38 kg/m2  SpO2 98% Wt Readings from Last 3 Encounters:  04/17/13 177 lb 1.9 oz (80.341 kg)  03/04/13 174 lb 12.8 oz (79.289 kg)  02/18/13 174 lb (78.926 kg)   Constitutional: She appears well-developed and well-nourished. No distress.  dtr at side Neck: Normal range of motion. Neck supple. No JVD present. No thyromegaly present.  Chest/breast - right breast with postsurgical changes, no rash or lesions. Thickened areas around surgical scarring but no palpable appreciable mass- no discharge Cardiovascular: Normal rate, regular rhythm and normal heart sounds.  No murmur heard. 1+ distal LLE edema, trace on RLE (improved). Pulmonary/Chest: Effort  normal and breath sounds normal. No respiratory distress. She has no wheezes.  Psychiatric: She has a normal mood and affect. Her behavior is normal. Judgment and thought content appropriate but limited insight.   Lab Results  Component Value Date   WBC 7.7 10/02/2012   HGB 8.5* 10/03/2012   HCT 25.0* 10/03/2012   PLT 260 10/02/2012   GLUCOSE 311* 03/04/2013   CHOL 135 10/02/2012   TRIG 101 10/02/2012   HDL 38* 10/02/2012   LDLCALC 77 10/02/2012   ALT 12 10/01/2012   AST 16 10/01/2012   NA 137 03/04/2013   K 4.3 03/04/2013   CL 108 03/04/2013   CREATININE 2.1* 03/04/2013   BUN 33* 03/04/2013   CO2 21 03/04/2013   TSH 2.712 10/01/2012   INR 1.04 10/01/2012   HGBA1C 11.1* 02/12/2013   Lab Results  Component Value Date   VITAMINB12 498 07/12/2011   2d echo 02/20/13: normal LV function at 55-60% EF, but marked LV wall thickening consistent with severe LVH     Assessment & Plan:   R breast "itch" and "pain" x 4 weeks - same side is affected cancer with prior surgery No evidence of dermatitis or apparent offending medication Arrange for diagnostic ultrasound Symptomatically tx with antihistamine hydroxyzine each bedtime and as needed

## 2013-04-17 NOTE — Patient Instructions (Signed)
It was good to see you today. Your annual flu shot was given and/or updated today. we'll make referral for breast ultrasound. Our office will contact you regarding appointment(s) once made. In meanwhile, take hydroxyzine as needed for itch, especially at bedtime Your prescription(s) have been submitted to your pharmacy. Please take as directed and contact our office if you believe you are having problem(s) with the medication(s).

## 2013-04-17 NOTE — Assessment & Plan Note (Signed)
Uncontrolled - complicated by renal insufficiency, peripheral neuropathy (on Lyrica for same) Compliance complicated by dementia, med confusion and inconsistent caregivers (dtrs: Diane who is more attentive, and Alexis who provides less supervison) Oral meds "stopped" 10/2010 and resumed Lantus after UC eval 10/20/10 - increased dose rx'd 10/2010 Then changed Lantus to [facility] 06/2011 and stopped metformin due to ARI Now back on Lantus after SNF stay 03/2012 - reviewed same and dosing recs with pt/dtr today Working with endo since 08/2012 - med recommended reviewed from last OV Pt still living with her dtr diane who helps pt with her meds -  Reviewed importance of med and diet compliance  pt/family to call if sugar over 200 - Lab Results  Component Value Date   HGBA1C 11.1* 02/12/2013   

## 2013-04-24 ENCOUNTER — Other Ambulatory Visit: Payer: Self-pay | Admitting: Internal Medicine

## 2013-04-24 DIAGNOSIS — C50911 Malignant neoplasm of unspecified site of right female breast: Secondary | ICD-10-CM

## 2013-04-24 DIAGNOSIS — N644 Mastodynia: Secondary | ICD-10-CM

## 2013-04-28 IMAGING — CT CT HEAD W/O CM
2 series · 16 of 30 positions shown, 20 images · non-contrast
Comparison: [HOSPITAL] CT head dated 10/11/2009

CLINICAL DATA: Altered mental status, weakness

CT HEAD WITHOUT CONTRAST
TECHNIQUE: Contiguous axial images were obtained from the base of
the skull through the vertex without contrast.

[Series 2: head w/o · axial · non-contrast · 0.43mm/px · z∈[-166,-41]mm · 13 of 31 slices shown, 17 images]
[im 3/31  brain]
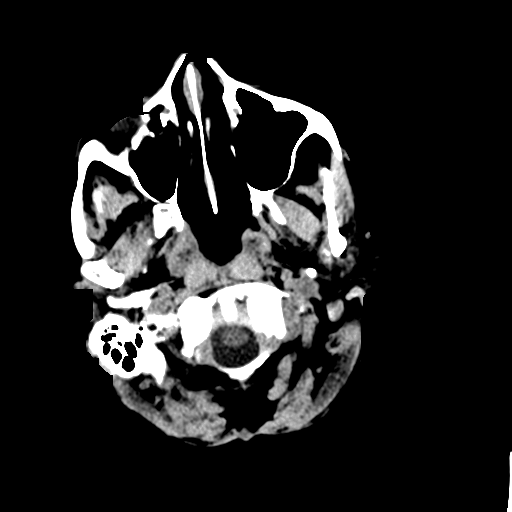
[im 3/31  bone]
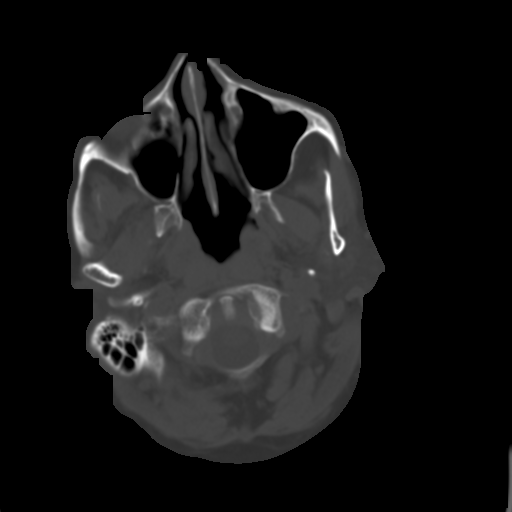
[im 5/31  brain]
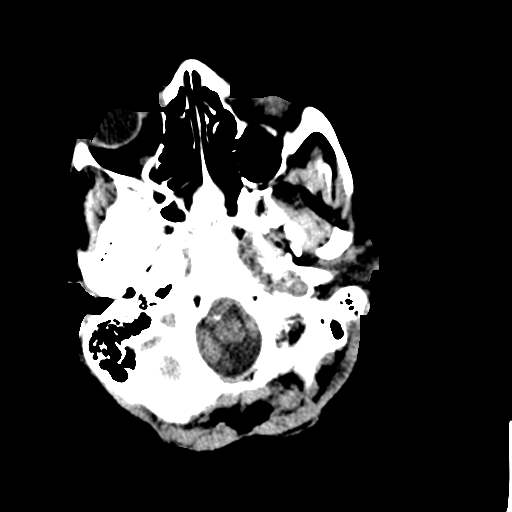
[im 7/31  brain]
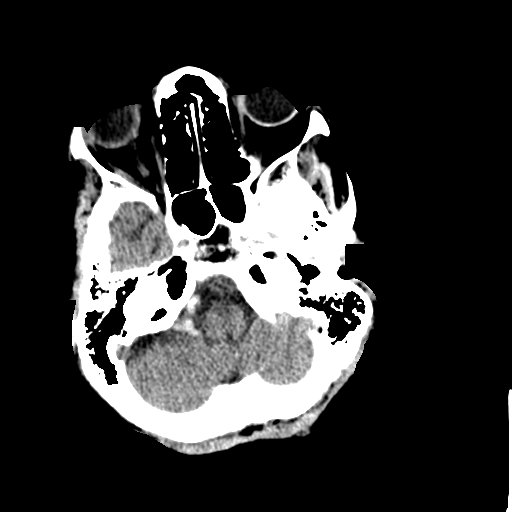
[im 9/31  brain]
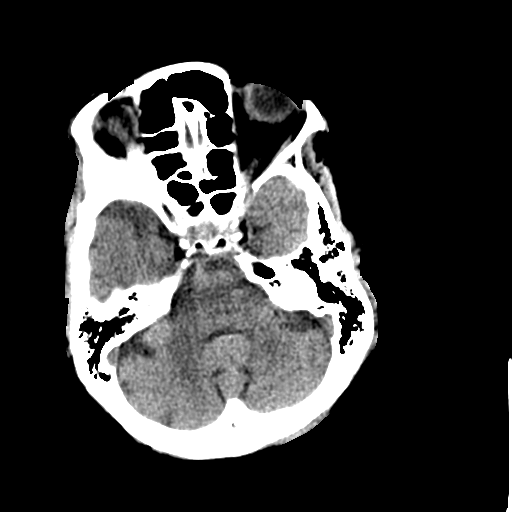
[im 11/31  brain]
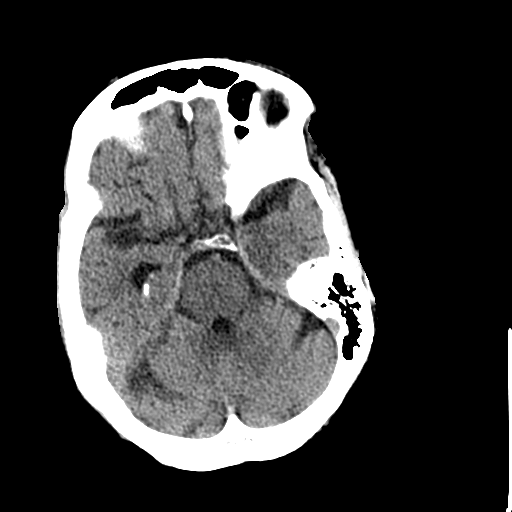
[im 11/31  bone]
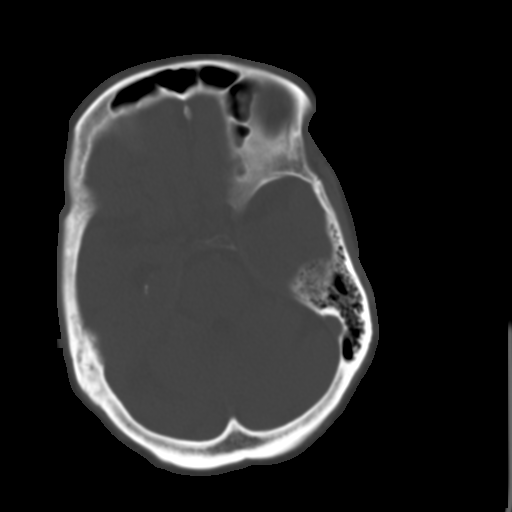
[im 13/31  brain]
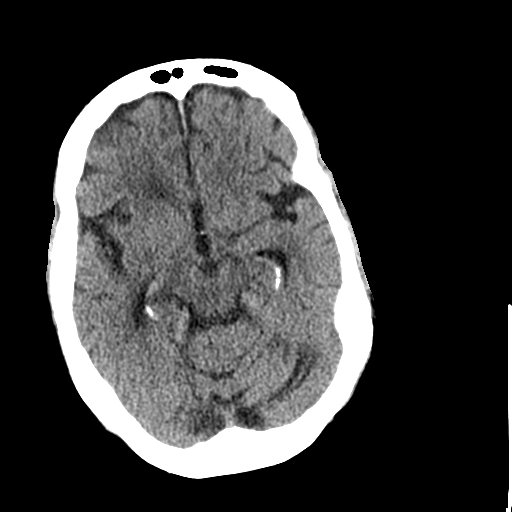
[im 16/31  brain]
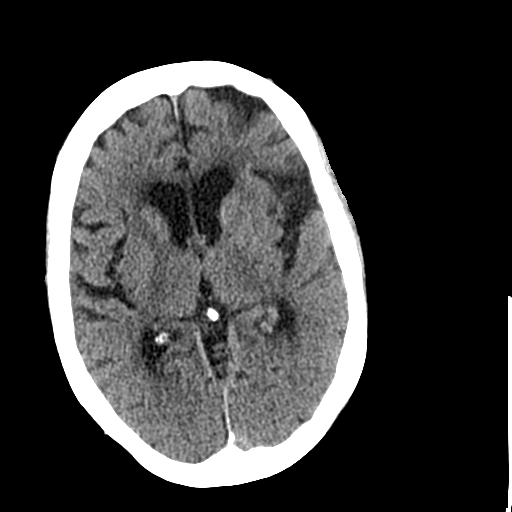
[im 18/31  brain]
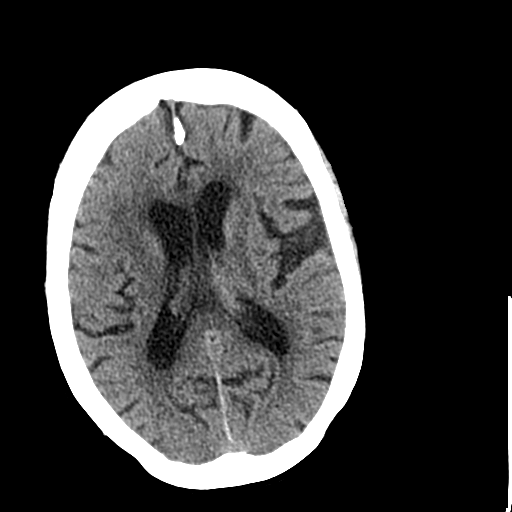
[im 20/31  brain]
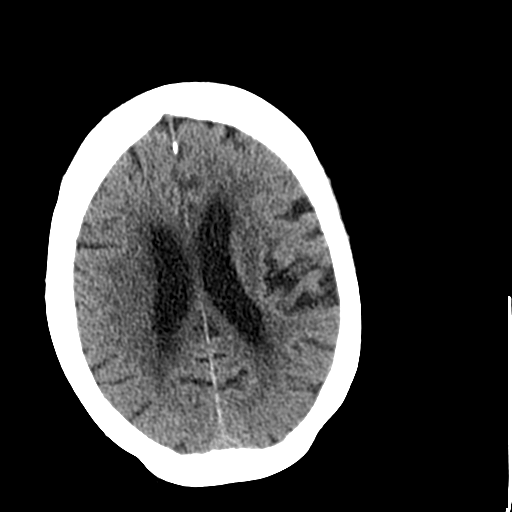
[im 20/31  bone]
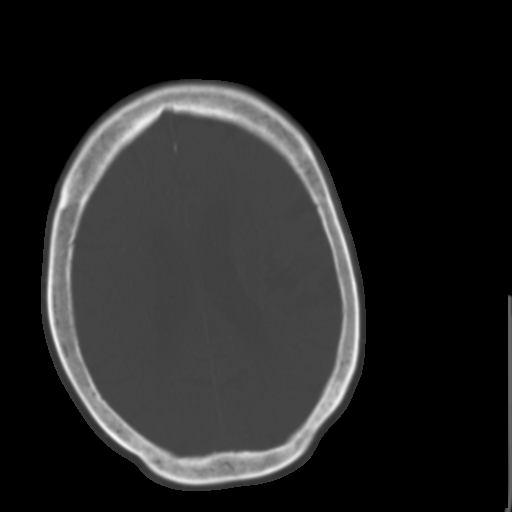
[im 22/31  brain]
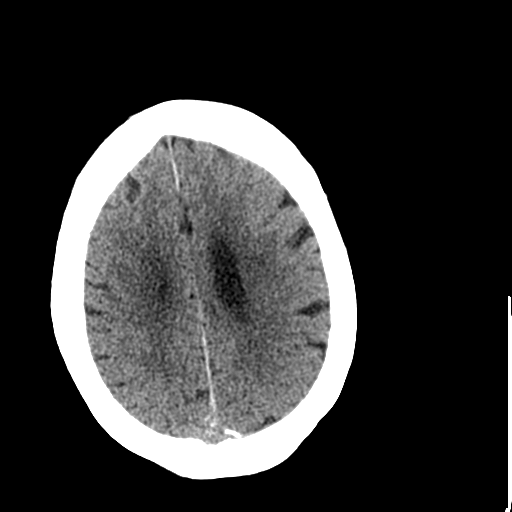
[im 24/31  brain]
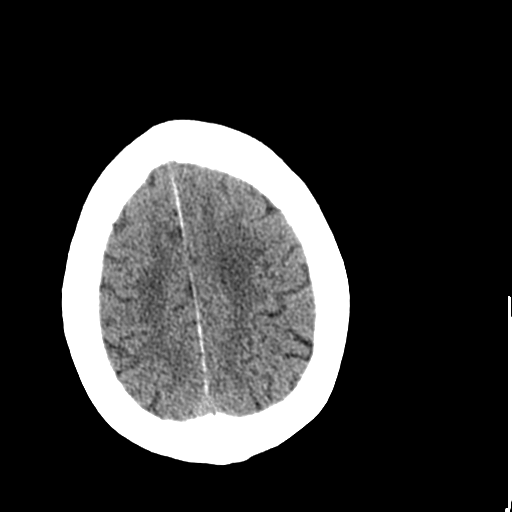
[im 26/31  brain]
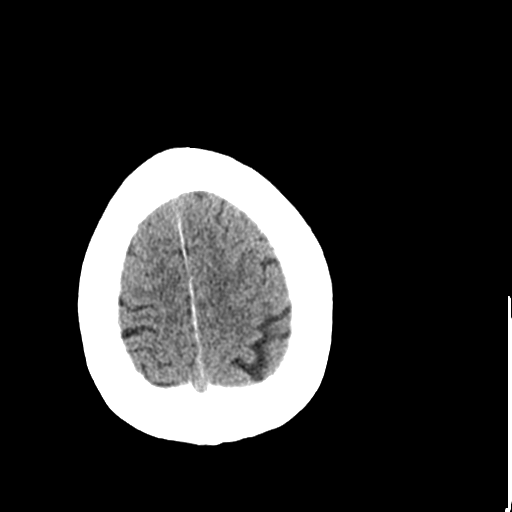
[im 28/31  brain]
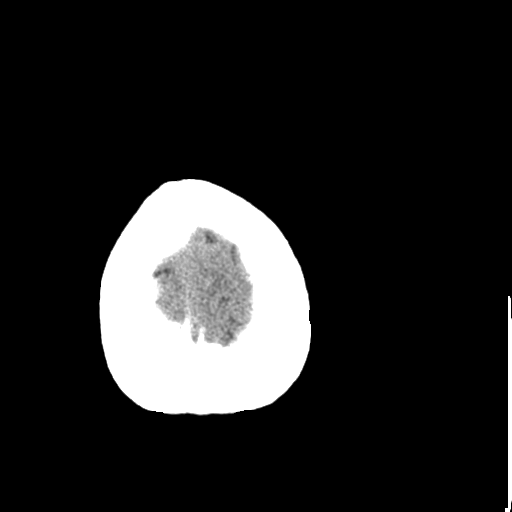
[im 28/31  bone]
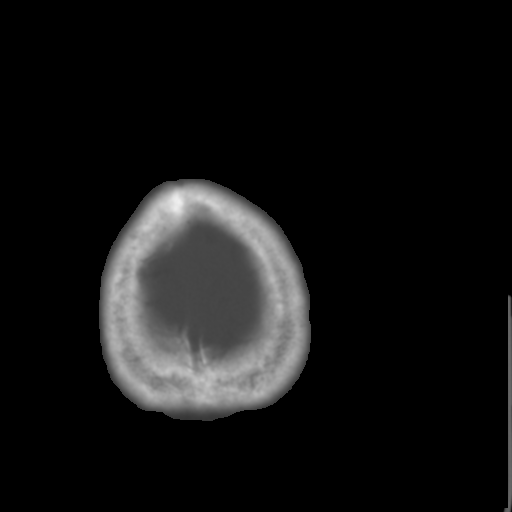

[Series 3: bone windows · axial · 0.43mm/px · z∈[-166,-126]mm · 3 of 31 slices shown]
[im 3/31  bone]
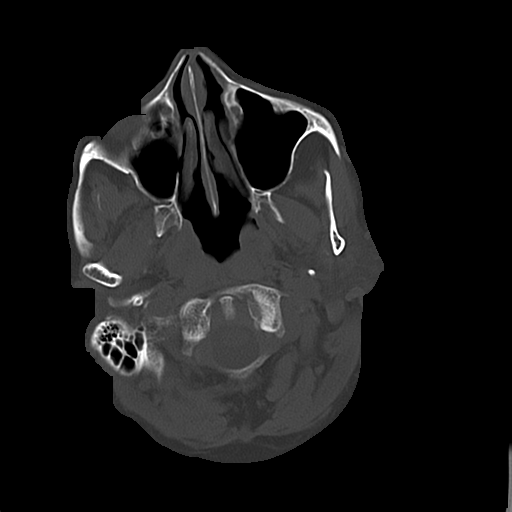
[im 7/31  bone]
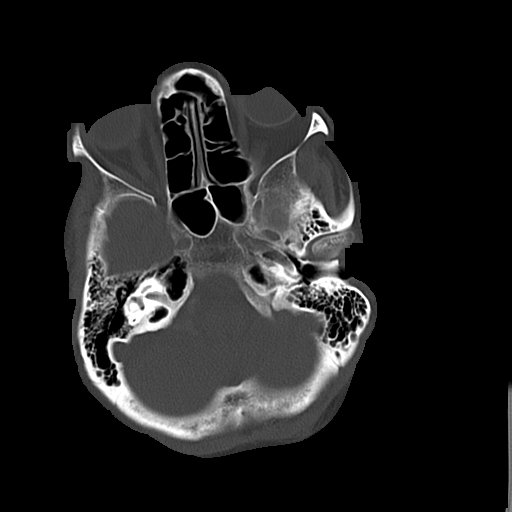
[im 11/31  bone]
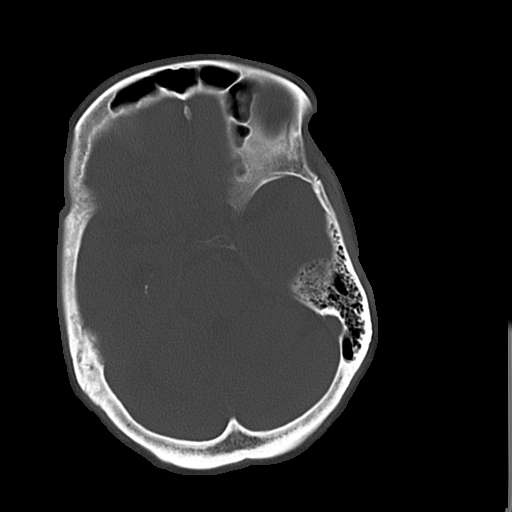

[16 of 30 positions shown; findings below may reference images not displayed]

FINDINGS: No evidence of parenchymal hemorrhage or extra-axial
fluid collection. No mass lesion, mass effect, or midline shift.

No CT evidence of acute infarction.

Subcortical white matter and periventricular small vessel ischemic
changes.  Intracranial atherosclerosis.

Global cortical atrophy.  No ventriculomegaly.

The visualized paranasal sinuses are essentially clear. The mastoid
air cells are unopacified.

No evidence of calvarial fracture.
IMPRESSION: No evidence of acute intracranial abnormality.

Atrophy with small vessel ischemic changes and intracranial
atherosclerosis.

## 2013-04-30 DIAGNOSIS — N2581 Secondary hyperparathyroidism of renal origin: Secondary | ICD-10-CM | POA: Diagnosis not present

## 2013-04-30 DIAGNOSIS — D649 Anemia, unspecified: Secondary | ICD-10-CM | POA: Diagnosis not present

## 2013-05-03 ENCOUNTER — Ambulatory Visit
Admission: RE | Admit: 2013-05-03 | Discharge: 2013-05-03 | Disposition: A | Discharge status: Home / Self Care | Payer: Medicare Other | Source: Ambulatory Visit | Attending: Internal Medicine | Admitting: Internal Medicine

## 2013-05-03 DIAGNOSIS — C50911 Malignant neoplasm of unspecified site of right female breast: Secondary | ICD-10-CM

## 2013-05-03 DIAGNOSIS — Z853 Personal history of malignant neoplasm of breast: Secondary | ICD-10-CM | POA: Diagnosis not present

## 2013-05-03 DIAGNOSIS — N644 Mastodynia: Secondary | ICD-10-CM

## 2013-05-06 DIAGNOSIS — E7800 Pure hypercholesterolemia, unspecified: Secondary | ICD-10-CM | POA: Diagnosis not present

## 2013-05-06 DIAGNOSIS — I209 Angina pectoris, unspecified: Secondary | ICD-10-CM | POA: Diagnosis not present

## 2013-05-06 DIAGNOSIS — E119 Type 2 diabetes mellitus without complications: Secondary | ICD-10-CM | POA: Diagnosis not present

## 2013-05-06 DIAGNOSIS — I10 Essential (primary) hypertension: Secondary | ICD-10-CM | POA: Diagnosis not present

## 2013-05-06 DIAGNOSIS — I2510 Atherosclerotic heart disease of native coronary artery without angina pectoris: Secondary | ICD-10-CM | POA: Diagnosis not present

## 2013-05-06 DIAGNOSIS — N189 Chronic kidney disease, unspecified: Secondary | ICD-10-CM | POA: Diagnosis not present

## 2013-05-08 ENCOUNTER — Other Ambulatory Visit: Payer: Self-pay | Admitting: Internal Medicine

## 2013-05-08 DIAGNOSIS — D509 Iron deficiency anemia, unspecified: Secondary | ICD-10-CM | POA: Diagnosis not present

## 2013-05-10 ENCOUNTER — Other Ambulatory Visit (HOSPITAL_COMMUNITY): Payer: Self-pay

## 2013-05-13 ENCOUNTER — Inpatient Hospital Stay (HOSPITAL_COMMUNITY): Admission: RE | Admit: 2013-05-13 | Payer: Medicare Other | Source: Ambulatory Visit

## 2013-05-15 ENCOUNTER — Ambulatory Visit: Payer: Medicare Other | Admitting: Internal Medicine

## 2013-05-22 ENCOUNTER — Encounter: Payer: Self-pay | Admitting: Internal Medicine

## 2013-05-22 ENCOUNTER — Ambulatory Visit (INDEPENDENT_AMBULATORY_CARE_PROVIDER_SITE_OTHER): Payer: Medicare Other | Admitting: Internal Medicine

## 2013-05-22 ENCOUNTER — Encounter: Payer: Self-pay | Admitting: *Deleted

## 2013-05-22 ENCOUNTER — Other Ambulatory Visit (INDEPENDENT_AMBULATORY_CARE_PROVIDER_SITE_OTHER): Payer: Medicare Other

## 2013-05-22 VITALS — BP 140/62 | HR 69 | Temp 97.7°F | Wt 180.1 lb

## 2013-05-22 DIAGNOSIS — R239 Unspecified skin changes: Secondary | ICD-10-CM | POA: Insufficient documentation

## 2013-05-22 DIAGNOSIS — F0390 Unspecified dementia without behavioral disturbance: Secondary | ICD-10-CM | POA: Diagnosis not present

## 2013-05-22 DIAGNOSIS — R238 Other skin changes: Secondary | ICD-10-CM

## 2013-05-22 DIAGNOSIS — N184 Chronic kidney disease, stage 4 (severe): Secondary | ICD-10-CM

## 2013-05-22 DIAGNOSIS — E1129 Type 2 diabetes mellitus with other diabetic kidney complication: Secondary | ICD-10-CM | POA: Diagnosis not present

## 2013-05-22 NOTE — Assessment & Plan Note (Signed)
Previously on bicarbonate for mild acidosis September 2013 hospitalization during hyperosmolar coma Progressive decline reviewed - follow up 03/2013 with Hyman Hopes reviewed Patient to remain off of renal toxic meds such as ACE inhibitors and metformin

## 2013-05-22 NOTE — Patient Instructions (Addendum)
It was good to see you today.  We have reviewed your prior records including labs and tests today  Test(s) ordered today. Your results will be released to MyChart (or called to you) after review, usually within 72hours after test completion. If any changes need to be made, you will be notified at that same time.  Medications reviewed and updated,  no other recommended  We will notify your physical therapy that it is okay to resume chair exercises as tolerated  Please schedule followup in 3-4 months, call sooner if problems.

## 2013-05-22 NOTE — Assessment & Plan Note (Signed)
Uncontrolled - complicated by renal insufficiency, peripheral neuropathy (on Lyrica for same) Compliance complicated by dementia, med confusion and inconsistent caregivers (dtrs: Diane who is more attentive, and Alexis who provides less supervison) Oral meds "stopped" 10/2010 and resumed Lantus after UC eval 10/20/10 - increased dose rx'd 10/2010 Then changed L[facility] to [facility] 06/2011 and stopped metformin due to ARI Now back on Lantus after SNF stay 03/2012 - reviewed same and dosing recs with pt/dtr today Working with endo since 08/2012 - med recommended reviewed from last OV Pt still living with her dtr diane who tries to helps pt with her meds -  Reviewed importance of med and diet compliance  i again requested pt/family to call if sugar over 200 - Lab Results  Component Value Date   HGBA1C 11.1* 02/12/2013

## 2013-05-22 NOTE — Assessment & Plan Note (Signed)
Progressive distractibility and forgetfulness -  Prior labs unremarkable -Normal TSH and b12 MRI brain 08/2009 - meningioma and SVD (reveiwed) Lab Results  Component Value Date   VITAMINB12 498 07/12/2011   rx'd aricept  10/2010 but did not start until 01/2011 Titrate up dose 07/2012 - family believes she has improved continue present plan and medications.

## 2013-05-22 NOTE — Progress Notes (Signed)
  Subjective:    Patient ID: [name], female    DOB: 1928/08/23, 79 y.o.   MRN: [id number]  HPI Here for follow up  -edema, DM2, dementia ?skin spots  Past Medical History  Diagnosis Date  . 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 ANEMIA   . DEPRESSION   . HYPERTENSION   . PVD   . INSOMNIA, CHRONIC   . Dementia   . DIABETES MELLITUS, TYPE II     renal, neuro - peripheral neuropathy  . Hypercholesteremia   . Chronic kidney disease (CKD), stage IV (severe)   . Anemia of chronic disease   . Angina   . Non-Q wave myocardial infarction 10/2009    /E-chart  . Inferior MI 2000    "quiet"  . Breast cancer 2000    right     Review of Systems  Respiratory: Negative for cough, chest tightness and shortness of breath.   Cardiovascular: Negative for chest pain, palpitations and leg swelling.  Musculoskeletal: Positive for back pain (improved s/p [facility] summer 2013). Negative for joint swelling.  Neurological: Negative for tingling.       Objective:   Physical Exam BP 140/62  Pulse 69  Temp(Src) 97.7 F (36.5 C) (Oral)  Wt 180 lb 1.9 oz (81.702 kg)  BMI 31.91 kg/m2  SpO2 95% Wt Readings from Last 3 Encounters:  05/22/13 180 lb 1.9 oz (81.702 kg)  04/17/13 177 lb 1.9 oz (80.341 kg)  03/04/13 174 lb 12.8 oz (79.289 kg)   Constitutional: She appears well-developed and well-nourished. No distress. youngest dtr at side Neck: Normal range of motion. Neck supple. No JVD present. No thyromegaly present.  Cardiovascular: Normal rate, regular rhythm and normal heart sounds.  No murmur heard. 1+ distal LLE edema, trace on RLE (much improved). Pulmonary/Chest: Effort normal and breath sounds normal. No respiratory distress. She has no wheezes.  Skin: numerous small dark nodules on BUE and shins - ?calcium deposition - no inflammation of these nodules - B ankle venous insuff changes Psychiatric: She has a normal mood and affect. Her behavior is normal. Judgment and thought content  appropriate but limited insight.   Lab Results  Component Value Date   WBC 7.7 10/02/2012   HGB 8.5* 10/03/2012   HCT 25.0* 10/03/2012   PLT 260 10/02/2012   GLUCOSE 311* 03/04/2013   CHOL 135 10/02/2012   TRIG 101 10/02/2012   HDL 38* 10/02/2012   LDLCALC 77 10/02/2012   ALT 12 10/01/2012   AST 16 10/01/2012   NA 137 03/04/2013   K 4.3 03/04/2013   CL 108 03/04/2013   CREATININE 2.1* 03/04/2013   BUN 33* 03/04/2013   CO2 21 03/04/2013   TSH 2.712 10/01/2012   INR 1.04 10/01/2012   HGBA1C 11.1* 02/12/2013   Lab Results  Component Value Date   VITAMINB12 498 07/12/2011   2d echo 02/20/13: normal LV function at 55-60% EF, but marked LV wall thickening consistent with severe LVH     Assessment & Plan:    See problem list. Medications and labs reviewed today.  Skin changes -appear related to calcium deposition disease, question related to renal disease. Has been unable to arrange dermatology evaluation or biopsy but will continue to look for same. Symptomatically, these do not disturb patient but are cosmetically disruptive -education reassurance provided, patient/daughter agree to call if symptoms worse or unimproved

## 2013-05-22 NOTE — Progress Notes (Signed)
Pre-visit discussion using our clinic review tool. No additional management support is needed unless otherwise documented below in the visit note.  

## 2013-06-05 ENCOUNTER — Other Ambulatory Visit: Payer: Self-pay | Admitting: Internal Medicine

## 2013-06-17 ENCOUNTER — Encounter (HOSPITAL_COMMUNITY)
Admission: RE | Admit: 2013-06-17 | Discharge: 2013-06-17 | Disposition: A | Discharge status: Home / Self Care | Payer: Medicare Other | Source: Ambulatory Visit | Attending: Nephrology | Admitting: Nephrology

## 2013-06-17 DIAGNOSIS — D649 Anemia, unspecified: Secondary | ICD-10-CM | POA: Diagnosis not present

## 2013-06-17 LAB — CBC
MCH: 22.5 pg — ABNORMAL LOW (ref 26.0–34.0)
Platelets: 331 10*3/uL (ref 150–400)
RBC: 3.46 MIL/uL — ABNORMAL LOW (ref 3.87–5.11)

## 2013-06-17 MED ORDER — EPOETIN ALFA 10000 UNIT/ML IJ SOLN: 10000.0000 [IU] | INTRAMUSCULAR | Status: DC

## 2013-06-17 MED ORDER — FERUMOXYTOL INJECTION 510 MG/17 ML
INTRAVENOUS | Status: AC
  Administered 2013-06-17: 510 mg
  Filled 2013-06-17: qty 17

## 2013-06-17 MED ORDER — FERUMOXYTOL INJECTION 510 MG/17 ML: 510.0000 mg | Freq: Once | INTRAVENOUS | Status: DC

## 2013-06-17 MED ORDER — EPOETIN ALFA 10000 UNIT/ML IJ SOLN
INTRAMUSCULAR | Status: AC
  Administered 2013-06-17: 10000 [IU] via SUBCUTANEOUS
  Filled 2013-06-17: qty 1

## 2013-06-17 MED ORDER — SODIUM CHLORIDE 0.9 % IV SOLN
INTRAVENOUS | Status: DC
  Administered 2013-06-17: 11:00:00 via INTRAVENOUS

## 2013-06-24 ENCOUNTER — Encounter (HOSPITAL_COMMUNITY): Payer: Medicaid Other

## 2013-06-24 ENCOUNTER — Encounter (HOSPITAL_COMMUNITY)
Admission: RE | Admit: 2013-06-24 | Discharge: 2013-06-24 | Disposition: A | Discharge status: Home / Self Care | Payer: Medicare Other | Source: Ambulatory Visit | Attending: Nephrology | Admitting: Nephrology

## 2013-06-24 DIAGNOSIS — D649 Anemia, unspecified: Secondary | ICD-10-CM | POA: Diagnosis not present

## 2013-06-24 LAB — IRON AND TIBC: Saturation Ratios: 28 % (ref 20–55)

## 2013-06-24 LAB — CBC
Hemoglobin: 8.5 g/dL — ABNORMAL LOW (ref 12.0–15.0)
MCV: 77.2 fL — ABNORMAL LOW (ref 78.0–100.0)
Platelets: 322 10*3/uL (ref 150–400)
RBC: 3.56 MIL/uL — ABNORMAL LOW (ref 3.87–5.11)
WBC: 5.5 10*3/uL (ref 4.0–10.5)

## 2013-06-24 LAB — FERRITIN: Ferritin: 351 ng/mL — ABNORMAL HIGH (ref 10–291)

## 2013-06-24 MED ORDER — SODIUM CHLORIDE 0.9 % IV SOLN
INTRAVENOUS | Status: DC
  Administered 2013-06-24: 250 mL via INTRAVENOUS

## 2013-06-24 MED ORDER — EPOETIN ALFA 10000 UNIT/ML IJ SOLN
10000.0000 [IU] | INTRAMUSCULAR | Status: DC
  Administered 2013-06-24: 10:00:00 10000 [IU] via SUBCUTANEOUS

## 2013-06-24 MED ORDER — FERUMOXYTOL INJECTION 510 MG/17 ML
INTRAVENOUS | Status: AC
  Administered 2013-06-24: 510 mg
  Filled 2013-06-24: qty 17

## 2013-06-24 MED ORDER — EPOETIN ALFA 10000 UNIT/ML IJ SOLN
INTRAMUSCULAR | Status: AC
  Filled 2013-06-24: qty 1

## 2013-06-28 ENCOUNTER — Other Ambulatory Visit (HOSPITAL_COMMUNITY): Payer: Self-pay | Admitting: *Deleted

## 2013-07-01 ENCOUNTER — Encounter (HOSPITAL_COMMUNITY)
Admission: RE | Admit: 2013-07-01 | Discharge: 2013-07-01 | Disposition: A | Discharge status: Home / Self Care | Payer: Medicare Other | Source: Ambulatory Visit | Attending: Nephrology | Admitting: Nephrology

## 2013-07-01 LAB — CBC
HCT: 33 % — ABNORMAL LOW (ref 36.0–46.0)
Hemoglobin: 10.3 g/dL — ABNORMAL LOW (ref 12.0–15.0)
MCHC: 31.2 g/dL (ref 30.0–36.0)
MCV: 83.1 fL (ref 78.0–100.0)
RBC: 3.97 MIL/uL (ref 3.87–5.11)
WBC: 8.8 10*3/uL (ref 4.0–10.5)

## 2013-07-01 LAB — DIFFERENTIAL
Basophils Relative: 1 % (ref 0–1)
Eosinophils Relative: 4 % (ref 0–5)
Lymphocytes Relative: 26 % (ref 12–46)
Monocytes Relative: 5 % (ref 3–12)
Neutro Abs: 5.6 10*3/uL (ref 1.7–7.7)
Neutrophils Relative %: 64 % (ref 43–77)

## 2013-07-01 MED ORDER — EPOETIN ALFA 10000 UNIT/ML IJ SOLN
10000.0000 [IU] | INTRAMUSCULAR | Status: DC
  Administered 2013-07-01: 14:00:00 10000 [IU] via SUBCUTANEOUS

## 2013-07-01 MED ORDER — EPOETIN ALFA 10000 UNIT/ML IJ SOLN
INTRAMUSCULAR | Status: AC
  Filled 2013-07-01: qty 1

## 2013-07-03 ENCOUNTER — Other Ambulatory Visit: Payer: Self-pay | Admitting: Internal Medicine

## 2013-07-08 ENCOUNTER — Encounter (HOSPITAL_COMMUNITY)
Admission: RE | Admit: 2013-07-08 | Discharge: 2013-07-08 | Disposition: A | Discharge status: Home / Self Care | Payer: Medicare Other | Source: Ambulatory Visit | Attending: Nephrology | Admitting: Nephrology

## 2013-07-08 LAB — CBC
HCT: 35.6 % — ABNORMAL LOW (ref 36.0–46.0)
Hemoglobin: 11 g/dL — ABNORMAL LOW (ref 12.0–15.0)
MCH: 26.8 pg (ref 26.0–34.0)
MCHC: 30.9 g/dL (ref 30.0–36.0)
MCV: 86.8 fL (ref 78.0–100.0)
Platelets: 249 10*3/uL (ref 150–400)
RBC: 4.1 MIL/uL (ref 3.87–5.11)
WBC: 6.2 10*3/uL (ref 4.0–10.5)

## 2013-07-08 MED ORDER — EPOETIN ALFA 10000 UNIT/ML IJ SOLN
10000.0000 [IU] | INTRAMUSCULAR | Status: DC
  Administered 2013-07-08: 10000 [IU] via SUBCUTANEOUS

## 2013-07-08 MED ORDER — EPOETIN ALFA 10000 UNIT/ML IJ SOLN
INTRAMUSCULAR | Status: AC
  Filled 2013-07-08: qty 1

## 2013-07-15 ENCOUNTER — Encounter (HOSPITAL_COMMUNITY)
Admission: RE | Admit: 2013-07-15 | Discharge: 2013-07-15 | Disposition: A | Discharge status: Home / Self Care | Payer: Medicare Other | Source: Ambulatory Visit | Attending: Nephrology | Admitting: Nephrology

## 2013-07-15 LAB — CBC
HCT: 38 % (ref 36.0–46.0)
Hemoglobin: 12.2 g/dL (ref 12.0–15.0)
MCHC: 32.1 g/dL (ref 30.0–36.0)
MCV: 88 fL (ref 78.0–100.0)

## 2013-07-15 MED ORDER — EPOETIN ALFA 10000 UNIT/ML IJ SOLN: 10000.0000 [IU] | INTRAMUSCULAR | Status: DC

## 2013-07-29 ENCOUNTER — Encounter (HOSPITAL_COMMUNITY)
Admission: RE | Admit: 2013-07-29 | Discharge: 2013-07-29 | Disposition: A | Discharge status: Home / Self Care | Payer: Medicare Other | Source: Ambulatory Visit | Attending: Nephrology | Admitting: Nephrology

## 2013-07-29 DIAGNOSIS — D649 Anemia, unspecified: Secondary | ICD-10-CM | POA: Insufficient documentation

## 2013-07-29 LAB — CBC
HCT: 39.3 % (ref 36.0–46.0)
HEMOGLOBIN: 12.5 g/dL (ref 12.0–15.0)
MCH: 28.3 pg (ref 26.0–34.0)
MCHC: 31.8 g/dL (ref 30.0–36.0)
MCV: 88.9 fL (ref 78.0–100.0)
PLATELETS: 206 10*3/uL (ref 150–400)
RBC: 4.42 MIL/uL (ref 3.87–5.11)
WBC: 5.6 10*3/uL (ref 4.0–10.5)

## 2013-07-29 MED ORDER — EPOETIN ALFA 10000 UNIT/ML IJ SOLN: 10000.0000 [IU] | INTRAMUSCULAR | Status: DC

## 2013-07-31 ENCOUNTER — Other Ambulatory Visit: Payer: Self-pay | Admitting: Internal Medicine

## 2013-08-05 DIAGNOSIS — I10 Essential (primary) hypertension: Secondary | ICD-10-CM | POA: Diagnosis not present

## 2013-08-05 DIAGNOSIS — E7800 Pure hypercholesterolemia, unspecified: Secondary | ICD-10-CM | POA: Diagnosis not present

## 2013-08-05 DIAGNOSIS — I2510 Atherosclerotic heart disease of native coronary artery without angina pectoris: Secondary | ICD-10-CM | POA: Diagnosis not present

## 2013-08-05 DIAGNOSIS — E119 Type 2 diabetes mellitus without complications: Secondary | ICD-10-CM | POA: Diagnosis not present

## 2013-08-09 ENCOUNTER — Other Ambulatory Visit (HOSPITAL_COMMUNITY): Payer: Self-pay | Admitting: *Deleted

## 2013-08-12 ENCOUNTER — Encounter (HOSPITAL_COMMUNITY)
Admission: RE | Admit: 2013-08-12 | Discharge: 2013-08-12 | Disposition: A | Discharge status: Home / Self Care | Payer: Medicare Other | Source: Ambulatory Visit | Attending: Nephrology | Admitting: Nephrology

## 2013-08-12 LAB — POCT HEMOGLOBIN-HEMACUE: Hemoglobin: 11.7 g/dL — ABNORMAL LOW (ref 12.0–15.0)

## 2013-08-12 MED ORDER — EPOETIN ALFA 10000 UNIT/ML IJ SOLN
INTRAMUSCULAR | Status: AC
  Filled 2013-08-12: qty 1

## 2013-08-12 MED ORDER — EPOETIN ALFA 10000 UNIT/ML IJ SOLN
10000.0000 [IU] | INTRAMUSCULAR | Status: DC
  Administered 2013-08-12: 10000 [IU] via SUBCUTANEOUS

## 2013-08-19 ENCOUNTER — Encounter (HOSPITAL_COMMUNITY)
Admission: RE | Admit: 2013-08-19 | Discharge: 2013-08-19 | Disposition: A | Discharge status: Home / Self Care | Payer: Medicare Other | Source: Ambulatory Visit | Attending: Nephrology | Admitting: Nephrology

## 2013-08-19 LAB — POCT HEMOGLOBIN-HEMACUE: HEMOGLOBIN: 12.4 g/dL (ref 12.0–15.0)

## 2013-08-19 MED ORDER — EPOETIN ALFA 10000 UNIT/ML IJ SOLN: 10000.0000 [IU] | INTRAMUSCULAR | Status: DC

## 2013-08-28 ENCOUNTER — Other Ambulatory Visit: Payer: Self-pay | Admitting: Internal Medicine

## 2013-09-02 ENCOUNTER — Encounter (HOSPITAL_COMMUNITY)
Admission: RE | Admit: 2013-09-02 | Discharge: 2013-09-02 | Disposition: A | Discharge status: Home / Self Care | Payer: Medicare Other | Source: Ambulatory Visit | Attending: Nephrology | Admitting: Nephrology

## 2013-09-02 DIAGNOSIS — D649 Anemia, unspecified: Secondary | ICD-10-CM | POA: Insufficient documentation

## 2013-09-02 LAB — FERRITIN: Ferritin: 123 ng/mL (ref 10–291)

## 2013-09-02 LAB — IRON AND TIBC
Iron: 72 ug/dL (ref 42–135)
SATURATION RATIOS: 26 % (ref 20–55)
TIBC: 278 ug/dL (ref 250–470)
UIBC: 206 ug/dL (ref 125–400)

## 2013-09-02 LAB — POCT HEMOGLOBIN-HEMACUE: HEMOGLOBIN: 12.1 g/dL (ref 12.0–15.0)

## 2013-09-02 MED ORDER — EPOETIN ALFA 10000 UNIT/ML IJ SOLN: 10000.0000 [IU] | INTRAMUSCULAR | Status: DC

## 2013-09-16 ENCOUNTER — Encounter (HOSPITAL_COMMUNITY)
Admission: RE | Admit: 2013-09-16 | Discharge: 2013-09-16 | Disposition: A | Discharge status: Home / Self Care | Payer: Medicare Other | Source: Ambulatory Visit | Attending: Nephrology | Admitting: Nephrology

## 2013-09-16 LAB — POCT HEMOGLOBIN-HEMACUE: Hemoglobin: 12.1 g/dL (ref 12.0–15.0)

## 2013-09-16 MED ORDER — EPOETIN ALFA 10000 UNIT/ML IJ SOLN: 10000.0000 [IU] | INTRAMUSCULAR | Status: DC

## 2013-09-24 ENCOUNTER — Telehealth: Payer: Self-pay | Admitting: *Deleted

## 2013-09-24 MED ORDER — PREGABALIN 75 MG PO CAPS: ORAL_CAPSULE | ORAL | Status: DC

## 2013-09-24 NOTE — Telephone Encounter (Signed)
Received fax pt is needing a refill on her Lyrica. The most recent rx written on 07/31/13 was for 50 capsule. Wanting to get #60 that will be for 30 days. pls advise...[name]

## 2013-09-24 NOTE — Telephone Encounter (Signed)
[facility].../lmb 

## 2013-09-24 NOTE — Telephone Encounter (Signed)
Done hardcopy to robin  

## 2013-09-25 ENCOUNTER — Other Ambulatory Visit: Payer: Self-pay | Admitting: Internal Medicine

## 2013-09-26 NOTE — Telephone Encounter (Signed)
Last OV with you was 04/2013--please advise

## 2013-09-30 ENCOUNTER — Encounter (HOSPITAL_COMMUNITY)
Admission: RE | Admit: 2013-09-30 | Discharge: 2013-09-30 | Disposition: A | Discharge status: Home / Self Care | Payer: Medicare Other | Source: Ambulatory Visit | Attending: Nephrology | Admitting: Nephrology

## 2013-09-30 DIAGNOSIS — D649 Anemia, unspecified: Secondary | ICD-10-CM | POA: Diagnosis not present

## 2013-09-30 LAB — POCT HEMOGLOBIN-HEMACUE: Hemoglobin: 12.8 g/dL (ref 12.0–15.0)

## 2013-09-30 MED ORDER — EPOETIN ALFA 10000 UNIT/ML IJ SOLN: 10000.0000 [IU] | INTRAMUSCULAR | Status: DC

## 2013-10-14 ENCOUNTER — Encounter (HOSPITAL_COMMUNITY)
Admission: RE | Admit: 2013-10-14 | Discharge: 2013-10-14 | Disposition: A | Discharge status: Home / Self Care | Payer: Medicare Other | Source: Ambulatory Visit | Attending: Nephrology | Admitting: Nephrology

## 2013-10-14 LAB — POCT HEMOGLOBIN-HEMACUE: HEMOGLOBIN: 11.8 g/dL — AB (ref 12.0–15.0)

## 2013-10-14 MED ORDER — EPOETIN ALFA 10000 UNIT/ML IJ SOLN
10000.0000 [IU] | INTRAMUSCULAR | Status: DC
  Administered 2013-10-14: 10000 [IU] via SUBCUTANEOUS

## 2013-10-14 MED ORDER — EPOETIN ALFA 10000 UNIT/ML IJ SOLN
INTRAMUSCULAR | Status: AC
  Filled 2013-10-14: qty 1

## 2013-10-21 ENCOUNTER — Encounter (HOSPITAL_COMMUNITY)
Admission: RE | Admit: 2013-10-21 | Discharge: 2013-10-21 | Disposition: A | Discharge status: Home / Self Care | Payer: Medicare Other | Source: Ambulatory Visit | Attending: Nephrology | Admitting: Nephrology

## 2013-10-21 LAB — POCT HEMOGLOBIN-HEMACUE: Hemoglobin: 11.8 g/dL — ABNORMAL LOW (ref 12.0–15.0)

## 2013-10-21 MED ORDER — EPOETIN ALFA 10000 UNIT/ML IJ SOLN
INTRAMUSCULAR | Status: AC
  Filled 2013-10-21: qty 1

## 2013-10-21 MED ORDER — EPOETIN ALFA 10000 UNIT/ML IJ SOLN
10000.0000 [IU] | INTRAMUSCULAR | Status: DC
  Administered 2013-10-21: 10000 [IU] via SUBCUTANEOUS

## 2013-10-23 ENCOUNTER — Other Ambulatory Visit: Payer: Self-pay | Admitting: Internal Medicine

## 2013-10-25 ENCOUNTER — Other Ambulatory Visit (HOSPITAL_COMMUNITY): Payer: Self-pay | Admitting: *Deleted

## 2013-10-28 ENCOUNTER — Encounter (HOSPITAL_COMMUNITY)
Admission: RE | Admit: 2013-10-28 | Discharge: 2013-10-28 | Disposition: A | Discharge status: Home / Self Care | Payer: Medicare Other | Source: Ambulatory Visit | Attending: Nephrology | Admitting: Nephrology

## 2013-10-28 DIAGNOSIS — D649 Anemia, unspecified: Secondary | ICD-10-CM | POA: Diagnosis not present

## 2013-10-28 LAB — IRON AND TIBC
Iron: 87 ug/dL (ref 42–135)
Saturation Ratios: 27 % (ref 20–55)
TIBC: 326 ug/dL (ref 250–470)
UIBC: 239 ug/dL (ref 125–400)

## 2013-10-28 LAB — FERRITIN: Ferritin: 97 ng/mL (ref 10–291)

## 2013-10-28 MED ORDER — EPOETIN ALFA 10000 UNIT/ML IJ SOLN: 10000.0000 [IU] | INTRAMUSCULAR | Status: DC

## 2013-10-29 LAB — POCT HEMOGLOBIN-HEMACUE: Hemoglobin: 13 g/dL (ref 12.0–15.0)

## 2013-11-07 ENCOUNTER — Other Ambulatory Visit (INDEPENDENT_AMBULATORY_CARE_PROVIDER_SITE_OTHER): Payer: Medicare Other

## 2013-11-07 ENCOUNTER — Ambulatory Visit (INDEPENDENT_AMBULATORY_CARE_PROVIDER_SITE_OTHER): Payer: Medicare Other | Admitting: Internal Medicine

## 2013-11-07 ENCOUNTER — Encounter: Payer: Self-pay | Admitting: Internal Medicine

## 2013-11-07 VITALS — BP 154/62 | HR 66 | Temp 98.3°F | Wt 175.2 lb

## 2013-11-07 DIAGNOSIS — R238 Other skin changes: Secondary | ICD-10-CM | POA: Diagnosis not present

## 2013-11-07 DIAGNOSIS — E1129 Type 2 diabetes mellitus with other diabetic kidney complication: Secondary | ICD-10-CM

## 2013-11-07 DIAGNOSIS — N184 Chronic kidney disease, stage 4 (severe): Secondary | ICD-10-CM | POA: Diagnosis not present

## 2013-11-07 DIAGNOSIS — E1165 Type 2 diabetes mellitus with hyperglycemia: Principal | ICD-10-CM

## 2013-11-07 DIAGNOSIS — R239 Unspecified skin changes: Secondary | ICD-10-CM

## 2013-11-07 LAB — LIPID PANEL
CHOL/HDL RATIO: 4
Cholesterol: 132 mg/dL (ref 0–200)
HDL: 31.1 mg/dL — AB (ref >39.00)
LDL CALC: 78 mg/dL (ref 0–99)
Triglycerides: 114 mg/dL (ref 0.0–149.0)
VLDL: 22.8 mg/dL (ref 0.0–40.0)

## 2013-11-07 LAB — MICROALBUMIN / CREATININE URINE RATIO
Creatinine,U: 126.2 mg/dL
MICROALB UR: 1 mg/dL (ref 0.0–1.9)
Microalb Creat Ratio: 0.8 mg/g (ref 0.0–30.0)

## 2013-11-07 LAB — HEMOGLOBIN A1C: Hgb A1c MFr Bld: 11.2 % — ABNORMAL HIGH (ref 4.6–6.5)

## 2013-11-07 LAB — BASIC METABOLIC PANEL
BUN: 55 mg/dL — ABNORMAL HIGH (ref 6–23)
CHLORIDE: 107 meq/L (ref 96–112)
CO2: 23 mEq/L (ref 19–32)
Calcium: 8.6 mg/dL (ref 8.4–10.5)
Creatinine, Ser: 2.6 mg/dL — ABNORMAL HIGH (ref 0.4–1.2)
GFR: 23.03 mL/min — ABNORMAL LOW (ref >60.00)
GLUCOSE: 294 mg/dL — AB (ref 70–99)
POTASSIUM: 5.1 meq/L (ref 3.5–5.1)
Sodium: 139 mEq/L (ref 135–145)

## 2013-11-07 LAB — TSH: TSH: 2.79 u[IU]/mL (ref 0.35–5.50)

## 2013-11-07 MED ORDER — TRIAMCINOLONE ACETONIDE 0.1 % EX CREA: 1.0000 "application " | TOPICAL_CREAM | Freq: Two times a day (BID) | CUTANEOUS | Status: DC

## 2013-11-07 NOTE — Patient Instructions (Signed)
It was good to see you today.  We have reviewed your prior records including labs and tests today  Test(s) ordered today. Your results will be released to [place] (or called to you) after review, usually within 72hours after test completion. If any changes need to be made, you will be notified at that same time. Will send copy to Dr Terrence Dupont as requested  Medications reviewed and updated use steroid cream to skin 2x/daily as needed - no other changes recommended at this time. Your prescription(s) have been submitted to your pharmacy. Please take as directed and contact our office if you believe you are having problem(s) with the medication(s). Refill on medication(s) as discussed today.  we'll make referral to dermatology for skin evaluation and treatment . Our office will contact you regarding appointment(s) once made.  Make an appointment with Dr Justin Mend for kidney check and Dr Rogelia Boga for diabetes check as discussed  Please schedule followup in 6 months, call sooner if problems.

## 2013-11-07 NOTE — Assessment & Plan Note (Signed)
Chronic BUE lesions- no specific dx Re refer to derm for bx Topical steroids prn

## 2013-11-07 NOTE — Progress Notes (Signed)
Pre visit review using our clinic review tool, if applicable. No additional management support is needed unless otherwise documented below in the visit note. 

## 2013-11-07 NOTE — Assessment & Plan Note (Signed)
Uncontrolled - complicated by renal insufficiency, peripheral neuropathy (on Lyrica for same) Compliance complicated by dementia, med confusion and inconsistent caregivers (dtrs: Diane who is more attentive, and Alexis who provides less supervison) Oral meds "stopped" 10/2010  resumed Lantus after UC eval 10/2010 briefly changed Lantus to [facility] 06/2011 and stopped metformin due to ARI Now back on Lantus after SNF stay 03/2012 - Working with endo intermittently since 08/2012 -  reviewed last OV Pt still living with her dtr diane who tries to helps pt with her meds -  Reviewed importance of med and diet compliance  i again requested pt/family to call if sugar over 200 - Lab Results  Component Value Date   HGBA1C 13.2* 05/22/2013

## 2013-11-07 NOTE — Progress Notes (Signed)
Subjective:    Patient ID: [name], female    DOB: [birth year], 80 y.o.   MRN: [id number]  Arm Injury  Pertinent negatives include no chest pain.    Patient is here for follow up  Reviewed chronic medical issues and interval medical events  Also complains of continued "spots" on BUE  Past Medical History  Diagnosis Date  . 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 ANEMIA   . DEPRESSION   . HYPERTENSION   . PVD   . INSOMNIA, CHRONIC   . Dementia   . DIABETES MELLITUS, TYPE II     renal, neuro - peripheral neuropathy  . Hypercholesteremia   . Chronic kidney disease (CKD), stage IV (severe)   . Anemia of chronic disease   . Angina   . Non-Q wave myocardial infarction 10/2009    /E-chart  . Inferior MI 2000    "quiet"  . Breast cancer 2000    right    Review of Systems  Respiratory: Negative for cough and shortness of breath.   Cardiovascular: Negative for chest pain and palpitations.  Skin: Positive for rash (B arms, shin).       Objective:   Physical Exam  BP 154/62  Pulse 66  Temp(Src) 98.3 F (36.8 C) (Oral)  Wt 175 lb 3.2 oz (79.47 kg)  SpO2 95% Wt Readings from Last 3 Encounters:  11/07/13 175 lb 3.2 oz (79.47 kg)  06/24/13 170 lb (77.111 kg)  06/17/13 170 lb (77.111 kg)   Constitutional: She is overweight, but appears well-developed and well-nourished. No distress. diane at side Neck: Normal range of motion. Neck supple. No JVD present. No thyromegaly present.  Cardiovascular: Normal rate, regular rhythm and normal heart sounds.  No murmur heard. trace L>R edema at ankle Pulmonary/Chest: Effort normal and breath sounds normal. No respiratory distress. She has no wheezes.  Skin: numerous small dark nodules on BUE and shins - ?calcium deposition - no inflammation of these nodules - B ankle venous insuff changes Psychiatric: She has a normal mood and affect. Her behavior is normal. Judgment and thought content appropriate but limited insight.    Lab Results  Component Value Date   WBC 5.6 07/29/2013   HGB 13.0 10/28/2013   HCT 39.3 07/29/2013   PLT 206 07/29/2013   GLUCOSE 311* 03/04/2013   CHOL 135 10/02/2012   TRIG 101 10/02/2012   HDL 38* 10/02/2012   LDLCALC 77 10/02/2012   ALT 12 10/01/2012   AST 16 10/01/2012   NA 137 03/04/2013   K 4.3 03/04/2013   CL 108 03/04/2013   CREATININE 2.1* 03/04/2013   BUN 33* 03/04/2013   CO2 21 03/04/2013   TSH 2.712 10/01/2012   INR 1.04 10/01/2012   HGBA1C 13.2* 05/22/2013    No results found.     Assessment & Plan:   Problem List Items Addressed This Visit   CKD (chronic kidney disease) stage 4, GFR 15-29 ml/min     Previously on bicarbonate for mild acidosis September 2013 hospitalization during hyperosmolar coma Progressive decline reviewed -  OV 04/2013 with Justin Mend reviewed- to follow up q69mo Patient to remain off of renal toxic meds such as ACE inhibitors and metformin    Relevant Orders      Ambulatory referral to Dermatology   Skin change     Chronic BUE lesions- no specific dx Re refer to derm for bx Topical steroids prn    Relevant Orders  Ambulatory referral to Dermatology   Type II or unspecified type diabetes mellitus with renal manifestations, uncontrolled(250.42) - Primary      Uncontrolled - complicated by renal insufficiency, peripheral neuropathy (on Lyrica for same) Compliance complicated by dementia, med confusion and inconsistent caregivers (dtrs: Diane who is more attentive, and Alexis who provides less supervison) Oral meds "stopped" 10/2010  resumed Lantus after UC eval 10/2010 briefly changed Lantus to [facility] 06/2011 and stopped metformin due to ARI Now back on Lantus after SNF stay 03/2012 - Working with endo intermittently since 08/2012 -  reviewed last OV Pt still living with her dtr diane who tries to helps pt with her meds -  Reviewed importance of med and diet compliance  i again requested pt/family to call if sugar over 200 - Lab Results  Component  Value Date   HGBA1C 13.2* 05/22/2013      Relevant Orders      Hemoglobin A1c      Basic metabolic panel      Lipid panel      Microalbumin / creatinine urine ratio      Ambulatory referral to Dermatology

## 2013-11-07 NOTE — Assessment & Plan Note (Signed)
Previously on bicarbonate for mild acidosis September 2013 hospitalization during hyperosmolar coma Progressive decline reviewed -  OV 04/2013 with Justin Mend reviewed- to follow up q42mo Patient to remain off of renal toxic meds such as ACE inhibitors and metformin

## 2013-11-11 ENCOUNTER — Encounter (HOSPITAL_COMMUNITY)
Admission: RE | Admit: 2013-11-11 | Discharge: 2013-11-11 | Disposition: A | Discharge status: Home / Self Care | Payer: Medicare Other | Source: Ambulatory Visit | Attending: Nephrology | Admitting: Nephrology

## 2013-11-11 LAB — POCT HEMOGLOBIN-HEMACUE: Hemoglobin: 12 g/dL (ref 12.0–15.0)

## 2013-11-11 MED ORDER — EPOETIN ALFA 10000 UNIT/ML IJ SOLN: 10000.0000 [IU] | INTRAMUSCULAR | Status: DC

## 2013-11-12 DIAGNOSIS — E119 Type 2 diabetes mellitus without complications: Secondary | ICD-10-CM | POA: Diagnosis not present

## 2013-11-12 DIAGNOSIS — E7800 Pure hypercholesterolemia, unspecified: Secondary | ICD-10-CM | POA: Diagnosis not present

## 2013-11-12 DIAGNOSIS — I10 Essential (primary) hypertension: Secondary | ICD-10-CM | POA: Diagnosis not present

## 2013-11-12 DIAGNOSIS — N189 Chronic kidney disease, unspecified: Secondary | ICD-10-CM | POA: Diagnosis not present

## 2013-11-12 DIAGNOSIS — I2510 Atherosclerotic heart disease of native coronary artery without angina pectoris: Secondary | ICD-10-CM | POA: Diagnosis not present

## 2013-11-20 ENCOUNTER — Other Ambulatory Visit: Payer: Self-pay | Admitting: Internal Medicine

## 2013-11-20 ENCOUNTER — Other Ambulatory Visit: Payer: Self-pay | Admitting: *Deleted

## 2013-11-20 MED ORDER — INSULIN REGULAR HUMAN 100 UNIT/ML IJ SOLN: 8.0000 [IU] | Freq: Three times a day (TID) | INTRAMUSCULAR | Status: DC

## 2013-11-20 NOTE — Telephone Encounter (Signed)
Faxed script back to [facility].../lmb

## 2013-11-20 NOTE — Telephone Encounter (Signed)
MD out of office. Is this ok to refill.../lmb 

## 2013-11-20 NOTE — Telephone Encounter (Signed)
Done hardcopy to lucy 

## 2013-11-25 ENCOUNTER — Encounter (HOSPITAL_COMMUNITY)
Admission: RE | Admit: 2013-11-25 | Discharge: 2013-11-25 | Disposition: A | Discharge status: Home / Self Care | Payer: Medicare Other | Source: Ambulatory Visit | Attending: Nephrology | Admitting: Nephrology

## 2013-11-25 DIAGNOSIS — D649 Anemia, unspecified: Secondary | ICD-10-CM | POA: Diagnosis not present

## 2013-11-25 LAB — POCT HEMOGLOBIN-HEMACUE: Hemoglobin: 10.8 g/dL — ABNORMAL LOW (ref 12.0–15.0)

## 2013-11-25 MED ORDER — EPOETIN ALFA 10000 UNIT/ML IJ SOLN
10000.0000 [IU] | INTRAMUSCULAR | Status: DC
  Administered 2013-11-25: 10000 [IU] via SUBCUTANEOUS

## 2013-11-25 MED ORDER — EPOETIN ALFA 10000 UNIT/ML IJ SOLN
INTRAMUSCULAR | Status: AC
  Filled 2013-11-25: qty 1

## 2013-11-26 ENCOUNTER — Encounter: Payer: Self-pay | Admitting: Internal Medicine

## 2013-11-26 ENCOUNTER — Ambulatory Visit (INDEPENDENT_AMBULATORY_CARE_PROVIDER_SITE_OTHER): Payer: Medicare Other | Admitting: Internal Medicine

## 2013-11-26 VITALS — BP 118/68 | HR 68 | Temp 97.6°F | Resp 12 | Wt 175.0 lb

## 2013-11-26 DIAGNOSIS — E1129 Type 2 diabetes mellitus with other diabetic kidney complication: Secondary | ICD-10-CM

## 2013-11-26 DIAGNOSIS — E1165 Type 2 diabetes mellitus with hyperglycemia: Principal | ICD-10-CM

## 2013-11-26 NOTE — Patient Instructions (Addendum)
Please increase the Lantus to 23 units and take it at bedtime. Inject Regular insulin 8 units 30 min before every one of the 3 meals.  If you skip a meal, skip the regular insulin with that meal, also.  Please return in 1 month with your sugar log.

## 2013-11-26 NOTE — Progress Notes (Signed)
Patient ID: [name], female   DOB: 11-20-1928, 80 y.o.   MRN: [id number]  Subjective:   HPI [name] is an 80 y.o. woman, returning for management of DM2, dx 1990, insulin-dependent, uncontrolled, with complications (CAD-status post MI in 2000 and 2011, PVD, CKD stage IV, peripheral neuropathy). She is here accompanied by her son, but it is her daughter [name]) that lives with her, checks her sugars, and gives her insulin. The daughter works nights. She is here with [name]. Pt returns after an absence of 9 months.  Her last hemoglobin A1c was: Lab Results  Component Value Date   HGBA1C 11.2* 11/07/2013   HGBA1C 13.2* 05/22/2013   HGBA1C 11.1* 02/12/2013  Previously: 11.9%, prev. 14.3% (08/24/2012).  She has decreased compliance due to dementia, mental confusion, and different caregivers (her daughters [name] and Environmental education officer).  Pt tells me she does not take the insulin every day! [name] called the other daughter, [name], and she mentioned that the pt only gets the 8 units of Regular 2x a day: at 8 am and at 2 pm, after lunch at the center! No Regular insulin with dinner!  - Lantus 23 units >> 8 units (unclear why dose decreased!) - Regular 8 units tid >> bid (switched from NovoLog b/c her grazing habit)   Patient checks her sugars 1-2x a day (no log).  - am: 150-400  (but mostly in the 200s) >> 200-300 >> "sometimes sugars are high" (cannot remember sugars and daughter does not know) She has hypoglycemia awareness, but unclear at what value. Apparently no lows.    - She has chronic kidney disease, with her last BUN/creatinine: Lab Results  Component Value Date   BUN 55* 11/07/2013   Lab Results  Component Value Date   CREATININE 2.6* 11/07/2013   - She also has coronary artery disease status post 2 MIs and PVD.  - She sees her eye dr. but not every year. Last visit this year. She had cataract sx in the past. The daughter tells me that they would schedule a new appointment.  - She has  numbness and tingling in her legs and also burning R>L.  Foot exam performed in 01/2013.  I reviewed pt's medications, allergies, PMH, social hx, family hx and no changes required except as above. Lasix started last week for leg swelling >> pain in feet, hard to walk. Patient has chest pain and shortness of breath, but the son tells me that this is not new, and patient's cardiologist is aware of this.  Review of Systems Constitutional: no weight gain or loss, + fatigue, decreased appetite.  Eyes: no blurry vision, no xerophthalmia ENT: no sore throat, no nodules palpated in throat, no dysphagia/odynophagia, no hoarseness Cardiovascular: no CP/ SOB/palpitations/+ leg swelling Respiratory: no cough/SOB Gastrointestinal: no N/V/D/C Musculoskeletal: no muscle/+ joint aches Skin: + rash - dark papulae no itching  Objective:   Physical Exam BP 118/68  Pulse 68  Temp(Src) 97.6 F (36.4 C) (Oral)  Resp 12  Wt 175 lb (79.379 kg)  SpO2 97% Wt Readings from Last 3 Encounters:  11/26/13 175 lb (79.379 kg)  11/07/13 175 lb 3.2 oz (79.47 kg)  06/24/13 170 lb (77.111 kg)   Constitutional: overweight, in NAD Eyes: PERRLA, EOMI, no exophthalmos ENT: moist mucous membranes, no thyromegaly, no cervical lymphadenopathy Cardiovascular: RRR, No MRG Respiratory: CTA B Gastrointestinal: abdomen soft, NT, ND, BS+ Musculoskeletal: no deformities, strength intact in all 4 Skin: moist, warm, left lateral periankle dark discoloration and small abrasion - chronic.  Periankle edema bilaterally.     Assessment:     1. DM2, insulin-dependent, uncontrolled, with complications - CAD-status post MI in 2000 and 2011 - PVD - CKD stage IV - Peripheral neuropathy  Plan:     1. The patient has a long history of DM 2, and has chronic kidney disease, which limits our treatment choices to insulin. She returns after an absence of 9 months. Her diabetes regimen is not the one that I recommended at last visit >> will  advise her to go back to that. I also discussed with the other daughter, [name], on the phone and she understood the changes   Patient Instructions  Please increase the Lantus to 23 units and take it at bedtime. Inject Regular insulin 8 units 30 min before every one of the 3 meals.  If you skip a meal, skip the regular insulin with that meal, also.  Please return in 1 month with your sugar log. - No labs today - given new logs >> check sugars 3-4x a day, before meals and at bedtime - I will see the patient back in one month with her log

## 2013-12-02 ENCOUNTER — Encounter (HOSPITAL_COMMUNITY)
Admission: RE | Admit: 2013-12-02 | Discharge: 2013-12-02 | Disposition: A | Discharge status: Home / Self Care | Payer: Medicare Other | Source: Ambulatory Visit | Attending: Nephrology | Admitting: Nephrology

## 2013-12-02 LAB — POCT HEMOGLOBIN-HEMACUE: Hemoglobin: 10.9 g/dL — ABNORMAL LOW (ref 12.0–15.0)

## 2013-12-02 MED ORDER — EPOETIN ALFA 10000 UNIT/ML IJ SOLN
INTRAMUSCULAR | Status: AC
  Filled 2013-12-02: qty 1

## 2013-12-02 MED ORDER — EPOETIN ALFA 10000 UNIT/ML IJ SOLN
10000.0000 [IU] | INTRAMUSCULAR | Status: DC
  Administered 2013-12-02: 10000 [IU] via SUBCUTANEOUS

## 2013-12-05 ENCOUNTER — Ambulatory Visit (INDEPENDENT_AMBULATORY_CARE_PROVIDER_SITE_OTHER): Payer: Medicare Other | Admitting: Internal Medicine

## 2013-12-05 ENCOUNTER — Encounter: Payer: Self-pay | Admitting: Internal Medicine

## 2013-12-05 VITALS — BP 168/70 | HR 63 | Temp 98.6°F | Resp 14 | Wt 173.1 lb

## 2013-12-05 DIAGNOSIS — F29 Unspecified psychosis not due to a substance or known physiological condition: Secondary | ICD-10-CM | POA: Diagnosis not present

## 2013-12-05 DIAGNOSIS — I10 Essential (primary) hypertension: Secondary | ICD-10-CM

## 2013-12-05 DIAGNOSIS — R410 Disorientation, unspecified: Secondary | ICD-10-CM

## 2013-12-05 DIAGNOSIS — R51 Headache: Secondary | ICD-10-CM | POA: Diagnosis not present

## 2013-12-05 MED ORDER — TRAMADOL HCL 50 MG PO TABS
50.0000 mg | ORAL_TABLET | Freq: Three times a day (TID) | ORAL | Status: DC | PRN
Start: 2013-12-05 — End: 2015-10-06

## 2013-12-05 NOTE — Progress Notes (Signed)
Pre visit review using our clinic review tool, if applicable. No additional management support is needed unless otherwise documented below in the visit note. 

## 2013-12-05 NOTE — Progress Notes (Signed)
Subjective:    Patient ID: [name], female    DOB: Sep 24, 1928, 80 y.o.   MRN: [id number]  HPI  She has had 2 episodes of confusion; the first was 12/03/13 when her daughter found her dressed at 78 PM as if she were going to the senior center she attends each morning.  The next morning at 7 AM she asked her daughter if she were going to work when in fact the daughter just returned from working overnight.  They are concerned that this is related to a change in her insulin from Lantus 24 units to Levemir 23 units. The insulin is typically taken at roughly 9:30 each night before the daughter goes to work  Her daughter monitors her glucoses. In the morning before 8 AM these range from 190-250. At noon the typical average is 230. At 4 PM average is 190.  Additionally the patient has had associated left frontal headache at the time of the mental confusion. This is described as sharp with radiation into the eye. It will last hours. Aspirin has been of benefit. BP not monitored @ home  Review of Systems  There is no associated blurred vision, double vision, loss of vision.  She denies any tinnitus, or loss of hearing  There's been no limb numbness, tingling, weakness  There's been no associated seizure activity or urine or stool incontinence     Objective:   Physical Exam Gen.: Healthy and well-nourished in appearance. Alert, appropriate and cooperative throughout exam. Appears younger than stated age  Head: Normocephalic without obvious abnormalities Eyes: No corneal or conjunctival inflammation noted. Pupils equal round reactive to light and accommodation. Extraocular motion intact. Ptosis bilaterally Ears: External  ear exam reveals no significant lesions or deformities. Canals clear .TMs normal. Hearing is grossly normal bilaterally. Nose: External nasal exam reveals no deformity or inflammation. Nasal mucosa are pink and moist. No lesions or exudates noted.   Mouth: Oral mucosa  and oropharynx reveal no lesions or exudates. Dentures in good repair. Neck: No deformities, masses, or tenderness noted. Range of motion decreased. Thyroid normal. Lungs: Normal respiratory effort; chest expands symmetrically. Lungs are clear to auscultation without rales, wheezes, or increased work of breathing. Heart: Normal rate and rhythm. Normal S1 and S2. No gallop, click, or rub. She has a grade 1/2 systolic murmur Abdomen: Bowel sounds normal; abdomen soft and nontender. No masses, organomegaly or hernias noted.                                  Musculoskeletal/extremities: No clubbing, cyanosis, edema, or significant extremity  deformity noted. Range of motion normal .Tone & strength normal. Hand joints normal  Fingernail  health good. Able to lie down & sit up w/o help. Negative SLR bilaterally Vascular: Carotid, radial artery, dorsalis pedis and  posterior tibial pulses are full and equal. No bruits present. Neurologic: Alert and oriented x3. Deep tendon reflexes symmetrical . Deep tendon reflexes are 0+ at the knees. Her gait is slightly unsteady but not broad or unbalanced.      She is unable to give me the date. She identifies Software engineer as Dealer". She is unable to identify his race. She did identify her daughter's day of birth as October [birth year], but  was unable to give the year. Skin: Intact without suspicious lesions or rashes. Lymph: No cervical, axillary lymphadenopathy present. Psych: Mood and affect are normal. Normally interactive  Assessment & Plan:  #1 confusion, episodic  #2 headache without definite neurologic deficit  #3 insulin-dependent diabetes  #4 HTN, home monitor requested  See orders and recommendations

## 2013-12-05 NOTE — Patient Instructions (Signed)
Minimal Blood Pressure Goal= AVERAGE < 140/90;  Ideal is an AVERAGE < 135/85. This AVERAGE should be calculated from @ least 5-7 BP readings taken @ different times of day on different days of week. You should not respond to isolated BP readings , but rather the AVERAGE for that week .Please bring your  blood pressure cuff to office visits to verify that it is reliable.It  can also be checked against the blood pressure device at the pharmacy. Finger or wrist cuffs are not dependable; an arm cuff is.; Increase the losartan 50 mg to a total of 2 pills daily and sees 100 mg).  Please keep a diary of your headaches . Document  each occurrence on the calendar with notation of : #1 any prodrome ( any non headache symptom such as marked fatigue,visual changes, ,etc ) which precedes actual headache ; #2) severity on 1-10 scale; #3) any triggers ( food/ drink,enviromenntal or weather changes ,physical or emotional stress) in 8-12 hour period prior to the headache; & #4) response to any medications or other intervention. Please review "Headache" @ [facility] for additional information.

## 2013-12-06 ENCOUNTER — Telehealth: Payer: Self-pay | Admitting: Internal Medicine

## 2013-12-06 NOTE — Telephone Encounter (Signed)
Relevant patient education mailed to patient.  

## 2013-12-09 ENCOUNTER — Encounter (HOSPITAL_COMMUNITY)
Admission: RE | Admit: 2013-12-09 | Discharge: 2013-12-09 | Disposition: A | Discharge status: Home / Self Care | Payer: Medicare Other | Source: Ambulatory Visit | Attending: Nephrology | Admitting: Nephrology

## 2013-12-09 LAB — POCT HEMOGLOBIN-HEMACUE: HEMOGLOBIN: 12.1 g/dL (ref 12.0–15.0)

## 2013-12-09 MED ORDER — EPOETIN ALFA 10000 UNIT/ML IJ SOLN: 10000.0000 [IU] | INTRAMUSCULAR | Status: DC

## 2013-12-23 ENCOUNTER — Encounter (HOSPITAL_COMMUNITY)
Admission: RE | Admit: 2013-12-23 | Discharge: 2013-12-23 | Disposition: A | Discharge status: Home / Self Care | Payer: Medicare Other | Source: Ambulatory Visit | Attending: Nephrology | Admitting: Nephrology

## 2013-12-23 DIAGNOSIS — D638 Anemia in other chronic diseases classified elsewhere: Secondary | ICD-10-CM | POA: Diagnosis not present

## 2013-12-23 DIAGNOSIS — N183 Chronic kidney disease, stage 3 unspecified: Secondary | ICD-10-CM | POA: Insufficient documentation

## 2013-12-23 LAB — POCT HEMOGLOBIN-HEMACUE: Hemoglobin: 13.3 g/dL (ref 12.0–15.0)

## 2013-12-23 LAB — IRON AND TIBC
Iron: 81 ug/dL (ref 42–135)
SATURATION RATIOS: 31 % (ref 20–55)
TIBC: 264 ug/dL (ref 250–470)
UIBC: 183 ug/dL (ref 125–400)

## 2013-12-23 LAB — FERRITIN: Ferritin: 164 ng/mL (ref 10–291)

## 2013-12-23 MED ORDER — EPOETIN ALFA 10000 UNIT/ML IJ SOLN: 10000.0000 [IU] | INTRAMUSCULAR | Status: DC

## 2013-12-24 DIAGNOSIS — N183 Chronic kidney disease, stage 3 unspecified: Secondary | ICD-10-CM | POA: Diagnosis not present

## 2013-12-27 ENCOUNTER — Ambulatory Visit: Payer: Medicare Other | Admitting: Internal Medicine

## 2014-01-06 ENCOUNTER — Encounter (HOSPITAL_COMMUNITY)
Admission: RE | Admit: 2014-01-06 | Discharge: 2014-01-06 | Disposition: A | Discharge status: Home / Self Care | Payer: Medicare Other | Source: Ambulatory Visit | Attending: Nephrology | Admitting: Nephrology

## 2014-01-06 DIAGNOSIS — D638 Anemia in other chronic diseases classified elsewhere: Secondary | ICD-10-CM | POA: Diagnosis not present

## 2014-01-06 LAB — POCT HEMOGLOBIN-HEMACUE: Hemoglobin: 12.5 g/dL (ref 12.0–15.0)

## 2014-01-06 MED ORDER — EPOETIN ALFA 10000 UNIT/ML IJ SOLN: 10000.0000 [IU] | INTRAMUSCULAR | Status: DC

## 2014-01-07 ENCOUNTER — Ambulatory Visit: Payer: Medicare Other | Admitting: Internal Medicine

## 2014-01-15 ENCOUNTER — Other Ambulatory Visit: Payer: Self-pay | Admitting: Internal Medicine

## 2014-01-15 ENCOUNTER — Other Ambulatory Visit: Payer: Self-pay | Admitting: *Deleted

## 2014-01-15 MED ORDER — INSULIN REGULAR HUMAN 100 UNIT/ML IJ SOLN: 8.0000 [IU] | Freq: Three times a day (TID) | INTRAMUSCULAR | Status: DC

## 2014-01-20 ENCOUNTER — Encounter (HOSPITAL_COMMUNITY)
Admission: RE | Admit: 2014-01-20 | Discharge: 2014-01-20 | Disposition: A | Discharge status: Home / Self Care | Payer: Medicare Other | Source: Ambulatory Visit | Attending: Nephrology | Admitting: Nephrology

## 2014-01-20 DIAGNOSIS — N183 Chronic kidney disease, stage 3 unspecified: Secondary | ICD-10-CM | POA: Diagnosis not present

## 2014-01-20 DIAGNOSIS — D638 Anemia in other chronic diseases classified elsewhere: Secondary | ICD-10-CM | POA: Diagnosis not present

## 2014-01-20 LAB — POCT HEMOGLOBIN-HEMACUE: HEMOGLOBIN: 12.2 g/dL (ref 12.0–15.0)

## 2014-01-20 MED ORDER — EPOETIN ALFA 10000 UNIT/ML IJ SOLN: 10000.0000 [IU] | INTRAMUSCULAR | Status: DC

## 2014-01-31 ENCOUNTER — Other Ambulatory Visit (HOSPITAL_COMMUNITY): Payer: Self-pay | Admitting: *Deleted

## 2014-02-03 ENCOUNTER — Encounter (HOSPITAL_COMMUNITY)
Admission: RE | Admit: 2014-02-03 | Discharge: 2014-02-03 | Disposition: A | Discharge status: Home / Self Care | Payer: Medicare Other | Source: Ambulatory Visit | Attending: Nephrology | Admitting: Nephrology

## 2014-02-03 DIAGNOSIS — D649 Anemia, unspecified: Secondary | ICD-10-CM | POA: Diagnosis not present

## 2014-02-03 LAB — POCT HEMOGLOBIN-HEMACUE: HEMOGLOBIN: 12.1 g/dL (ref 12.0–15.0)

## 2014-02-03 MED ORDER — EPOETIN ALFA 10000 UNIT/ML IJ SOLN: 10000.0000 [IU] | INTRAMUSCULAR | Status: DC

## 2014-02-15 ENCOUNTER — Other Ambulatory Visit: Payer: Self-pay | Admitting: Internal Medicine

## 2014-02-17 ENCOUNTER — Encounter (HOSPITAL_COMMUNITY)
Admission: RE | Admit: 2014-02-17 | Discharge: 2014-02-17 | Disposition: A | Discharge status: Home / Self Care | Payer: Medicare Other | Source: Ambulatory Visit | Attending: Nephrology | Admitting: Nephrology

## 2014-02-17 DIAGNOSIS — D649 Anemia, unspecified: Secondary | ICD-10-CM | POA: Diagnosis not present

## 2014-02-17 LAB — IRON AND TIBC
Iron: 103 ug/dL (ref 42–135)
Saturation Ratios: 34 % (ref 20–55)
TIBC: 301 ug/dL (ref 250–470)
UIBC: 198 ug/dL (ref 125–400)

## 2014-02-17 LAB — FERRITIN: Ferritin: 176 ng/mL (ref 10–291)

## 2014-02-17 LAB — POCT HEMOGLOBIN-HEMACUE: HEMOGLOBIN: 12.4 g/dL (ref 12.0–15.0)

## 2014-02-17 MED ORDER — EPOETIN ALFA 10000 UNIT/ML IJ SOLN: 10000.0000 [IU] | INTRAMUSCULAR | Status: DC

## 2014-02-27 DIAGNOSIS — E119 Type 2 diabetes mellitus without complications: Secondary | ICD-10-CM | POA: Diagnosis not present

## 2014-02-27 DIAGNOSIS — E785 Hyperlipidemia, unspecified: Secondary | ICD-10-CM | POA: Diagnosis not present

## 2014-02-27 DIAGNOSIS — I2510 Atherosclerotic heart disease of native coronary artery without angina pectoris: Secondary | ICD-10-CM | POA: Diagnosis not present

## 2014-02-27 DIAGNOSIS — I209 Angina pectoris, unspecified: Secondary | ICD-10-CM | POA: Diagnosis not present

## 2014-02-27 DIAGNOSIS — R0989 Other specified symptoms and signs involving the circulatory and respiratory systems: Secondary | ICD-10-CM | POA: Diagnosis not present

## 2014-02-27 DIAGNOSIS — I10 Essential (primary) hypertension: Secondary | ICD-10-CM | POA: Diagnosis not present

## 2014-02-27 DIAGNOSIS — R0609 Other forms of dyspnea: Secondary | ICD-10-CM | POA: Diagnosis not present

## 2014-02-27 DIAGNOSIS — M159 Polyosteoarthritis, unspecified: Secondary | ICD-10-CM | POA: Diagnosis not present

## 2014-02-27 DIAGNOSIS — I252 Old myocardial infarction: Secondary | ICD-10-CM | POA: Diagnosis not present

## 2014-03-03 ENCOUNTER — Encounter (HOSPITAL_COMMUNITY)
Admission: RE | Admit: 2014-03-03 | Discharge: 2014-03-03 | Disposition: A | Discharge status: Home / Self Care | Payer: Medicare Other | Source: Ambulatory Visit | Attending: Nephrology | Admitting: Nephrology

## 2014-03-03 DIAGNOSIS — D649 Anemia, unspecified: Secondary | ICD-10-CM | POA: Insufficient documentation

## 2014-03-03 LAB — POCT HEMOGLOBIN-HEMACUE: Hemoglobin: 12.3 g/dL (ref 12.0–15.0)

## 2014-03-03 MED ORDER — EPOETIN ALFA 10000 UNIT/ML IJ SOLN: 10000.0000 [IU] | INTRAMUSCULAR | Status: DC

## 2014-03-04 ENCOUNTER — Telehealth: Payer: Self-pay

## 2014-03-04 NOTE — Telephone Encounter (Signed)
[facility].   Need specifics on diagnosis/es.

## 2014-03-14 ENCOUNTER — Other Ambulatory Visit (HOSPITAL_COMMUNITY): Payer: Self-pay

## 2014-03-14 ENCOUNTER — Other Ambulatory Visit: Payer: Self-pay | Admitting: Internal Medicine

## 2014-03-17 ENCOUNTER — Encounter (HOSPITAL_COMMUNITY)
Admission: RE | Admit: 2014-03-17 | Discharge: 2014-03-17 | Disposition: A | Discharge status: Home / Self Care | Payer: Medicare Other | Source: Ambulatory Visit | Attending: Nephrology | Admitting: Nephrology

## 2014-03-17 DIAGNOSIS — D649 Anemia, unspecified: Secondary | ICD-10-CM | POA: Diagnosis not present

## 2014-03-17 LAB — POCT HEMOGLOBIN-HEMACUE: Hemoglobin: 11.2 g/dL — ABNORMAL LOW (ref 12.0–15.0)

## 2014-03-17 MED ORDER — EPOETIN ALFA 10000 UNIT/ML IJ SOLN
INTRAMUSCULAR | Status: AC
  Filled 2014-03-17: qty 1

## 2014-03-17 MED ORDER — EPOETIN ALFA 10000 UNIT/ML IJ SOLN
10000.0000 [IU] | INTRAMUSCULAR | Status: DC
  Administered 2014-03-17: 10000 [IU] via SUBCUTANEOUS

## 2014-04-01 DIAGNOSIS — Z0279 Encounter for issue of other medical certificate: Secondary | ICD-10-CM

## 2014-04-14 ENCOUNTER — Encounter (HOSPITAL_COMMUNITY)
Admission: RE | Admit: 2014-04-14 | Discharge: 2014-04-14 | Disposition: A | Discharge status: Home / Self Care | Payer: Medicare Other | Source: Ambulatory Visit | Attending: Nephrology | Admitting: Nephrology

## 2014-04-14 DIAGNOSIS — D649 Anemia, unspecified: Secondary | ICD-10-CM | POA: Diagnosis not present

## 2014-04-14 LAB — IRON AND TIBC
Iron: 94 ug/dL (ref 42–135)
Saturation Ratios: 38 % (ref 20–55)
TIBC: 246 ug/dL — AB (ref 250–470)
UIBC: 152 ug/dL (ref 125–400)

## 2014-04-14 LAB — FERRITIN: FERRITIN: 215 ng/mL (ref 10–291)

## 2014-04-14 LAB — POCT HEMOGLOBIN-HEMACUE: Hemoglobin: 10.6 g/dL — ABNORMAL LOW (ref 12.0–15.0)

## 2014-04-14 MED ORDER — EPOETIN ALFA 10000 UNIT/ML IJ SOLN
10000.0000 [IU] | INTRAMUSCULAR | Status: DC
  Administered 2014-04-14: 10000 [IU] via SUBCUTANEOUS

## 2014-04-14 MED ORDER — EPOETIN ALFA 10000 UNIT/ML IJ SOLN
INTRAMUSCULAR | Status: AC
  Filled 2014-04-14: qty 1

## 2014-04-18 ENCOUNTER — Telehealth: Payer: Self-pay | Admitting: Internal Medicine

## 2014-04-18 NOTE — Telephone Encounter (Signed)
error 

## 2014-04-23 ENCOUNTER — Telehealth: Payer: Self-pay | Admitting: Internal Medicine

## 2014-04-23 DIAGNOSIS — C50911 Malignant neoplasm of unspecified site of right female breast: Secondary | ICD-10-CM

## 2014-04-23 DIAGNOSIS — Z23 Encounter for immunization: Secondary | ICD-10-CM | POA: Diagnosis not present

## 2014-04-23 NOTE — Telephone Encounter (Signed)
Patient was referred to the breast center by Dr. Asa Lente for right breast cancer.  That same side is ichy and has turned dark.  She would like to go back to the breast center.  They are requesting a referral over.  Does [name] need to come in first for an ov.

## 2014-04-27 NOTE — Telephone Encounter (Signed)
[place] for refer to breast center - order done

## 2014-04-30 ENCOUNTER — Other Ambulatory Visit: Payer: Self-pay | Admitting: Internal Medicine

## 2014-04-30 DIAGNOSIS — R234 Changes in skin texture: Secondary | ICD-10-CM

## 2014-05-06 ENCOUNTER — Other Ambulatory Visit: Payer: Self-pay | Admitting: Internal Medicine

## 2014-05-06 DIAGNOSIS — R234 Changes in skin texture: Secondary | ICD-10-CM

## 2014-05-12 ENCOUNTER — Encounter (HOSPITAL_COMMUNITY)
Admission: RE | Admit: 2014-05-12 | Discharge: 2014-05-12 | Disposition: A | Discharge status: Home / Self Care | Payer: Medicare Other | Source: Ambulatory Visit | Attending: Nephrology | Admitting: Nephrology

## 2014-05-12 DIAGNOSIS — D631 Anemia in chronic kidney disease: Secondary | ICD-10-CM | POA: Insufficient documentation

## 2014-05-12 DIAGNOSIS — N183 Chronic kidney disease, stage 3 (moderate): Secondary | ICD-10-CM | POA: Insufficient documentation

## 2014-05-12 LAB — POCT HEMOGLOBIN-HEMACUE: Hemoglobin: 10.8 g/dL — ABNORMAL LOW (ref 12.0–15.0)

## 2014-05-12 MED ORDER — EPOETIN ALFA 10000 UNIT/ML IJ SOLN
10000.0000 [IU] | INTRAMUSCULAR | Status: DC
  Administered 2014-05-12: 10000 [IU] via SUBCUTANEOUS

## 2014-05-12 MED ORDER — EPOETIN ALFA 10000 UNIT/ML IJ SOLN
INTRAMUSCULAR | Status: AC
  Filled 2014-05-12: qty 1

## 2014-05-20 ENCOUNTER — Ambulatory Visit
Admission: RE | Admit: 2014-05-20 | Discharge: 2014-05-20 | Disposition: A | Discharge status: Home / Self Care | Payer: Medicare Other | Source: Ambulatory Visit | Attending: Internal Medicine | Admitting: Internal Medicine

## 2014-05-20 ENCOUNTER — Other Ambulatory Visit: Payer: Self-pay | Admitting: Internal Medicine

## 2014-05-20 DIAGNOSIS — R234 Changes in skin texture: Secondary | ICD-10-CM

## 2014-05-20 DIAGNOSIS — Z853 Personal history of malignant neoplasm of breast: Secondary | ICD-10-CM | POA: Diagnosis not present

## 2014-05-20 DIAGNOSIS — N6459 Other signs and symptoms in breast: Secondary | ICD-10-CM | POA: Diagnosis not present

## 2014-05-22 ENCOUNTER — Other Ambulatory Visit: Payer: Self-pay | Admitting: Internal Medicine

## 2014-05-23 ENCOUNTER — Other Ambulatory Visit: Payer: Self-pay

## 2014-05-23 NOTE — Telephone Encounter (Signed)
MD out of office

## 2014-05-23 NOTE — Telephone Encounter (Signed)
OK X1 

## 2014-05-23 NOTE — Telephone Encounter (Signed)
Script for Lyrica has been faxed to [facility] in [place]

## 2014-06-02 ENCOUNTER — Encounter (HOSPITAL_COMMUNITY)
Admission: RE | Admit: 2014-06-02 | Discharge: 2014-06-02 | Disposition: A | Discharge status: Home / Self Care | Payer: Medicare Other | Source: Ambulatory Visit | Attending: Nephrology | Admitting: Nephrology

## 2014-06-02 DIAGNOSIS — D631 Anemia in chronic kidney disease: Secondary | ICD-10-CM | POA: Diagnosis not present

## 2014-06-02 DIAGNOSIS — N183 Chronic kidney disease, stage 3 (moderate): Secondary | ICD-10-CM | POA: Diagnosis not present

## 2014-06-02 LAB — POCT HEMOGLOBIN-HEMACUE: Hemoglobin: 11.6 g/dL — ABNORMAL LOW (ref 12.0–15.0)

## 2014-06-02 MED ORDER — EPOETIN ALFA 10000 UNIT/ML IJ SOLN
INTRAMUSCULAR | Status: AC
  Filled 2014-06-02: qty 1

## 2014-06-02 MED ORDER — EPOETIN ALFA 10000 UNIT/ML IJ SOLN
10000.0000 [IU] | INTRAMUSCULAR | Status: DC
  Administered 2014-06-02: 10000 [IU] via SUBCUTANEOUS

## 2014-06-05 DIAGNOSIS — D649 Anemia, unspecified: Secondary | ICD-10-CM | POA: Diagnosis not present

## 2014-06-05 DIAGNOSIS — I2510 Atherosclerotic heart disease of native coronary artery without angina pectoris: Secondary | ICD-10-CM | POA: Diagnosis not present

## 2014-06-05 DIAGNOSIS — I10 Essential (primary) hypertension: Secondary | ICD-10-CM | POA: Diagnosis not present

## 2014-06-05 DIAGNOSIS — N189 Chronic kidney disease, unspecified: Secondary | ICD-10-CM | POA: Diagnosis not present

## 2014-06-05 DIAGNOSIS — I209 Angina pectoris, unspecified: Secondary | ICD-10-CM | POA: Diagnosis not present

## 2014-06-05 DIAGNOSIS — I252 Old myocardial infarction: Secondary | ICD-10-CM | POA: Diagnosis not present

## 2014-06-05 DIAGNOSIS — I129 Hypertensive chronic kidney disease with stage 1 through stage 4 chronic kidney disease, or unspecified chronic kidney disease: Secondary | ICD-10-CM | POA: Diagnosis not present

## 2014-06-05 DIAGNOSIS — R0609 Other forms of dyspnea: Secondary | ICD-10-CM | POA: Diagnosis not present

## 2014-06-06 DIAGNOSIS — I10 Essential (primary) hypertension: Secondary | ICD-10-CM | POA: Diagnosis not present

## 2014-06-06 DIAGNOSIS — E780 Pure hypercholesterolemia: Secondary | ICD-10-CM | POA: Diagnosis not present

## 2014-06-06 DIAGNOSIS — E119 Type 2 diabetes mellitus without complications: Secondary | ICD-10-CM | POA: Diagnosis not present

## 2014-06-17 ENCOUNTER — Other Ambulatory Visit: Payer: Self-pay | Admitting: Internal Medicine

## 2014-06-25 ENCOUNTER — Other Ambulatory Visit: Payer: Self-pay

## 2014-06-25 MED ORDER — PREGABALIN 75 MG PO CAPS: 75.0000 mg | ORAL_CAPSULE | Freq: Two times a day (BID) | ORAL | Status: DC

## 2014-06-27 ENCOUNTER — Other Ambulatory Visit (HOSPITAL_COMMUNITY): Payer: Self-pay | Admitting: *Deleted

## 2014-06-30 ENCOUNTER — Encounter (HOSPITAL_COMMUNITY)
Admission: RE | Admit: 2014-06-30 | Discharge: 2014-06-30 | Disposition: A | Discharge status: Home / Self Care | Payer: Medicare Other | Source: Ambulatory Visit | Attending: Nephrology | Admitting: Nephrology

## 2014-06-30 DIAGNOSIS — N183 Chronic kidney disease, stage 3 (moderate): Secondary | ICD-10-CM | POA: Insufficient documentation

## 2014-06-30 DIAGNOSIS — D631 Anemia in chronic kidney disease: Secondary | ICD-10-CM | POA: Diagnosis not present

## 2014-06-30 LAB — IRON AND TIBC
IRON: 92 ug/dL (ref 42–135)
Saturation Ratios: 29 % (ref 20–55)
TIBC: 312 ug/dL (ref 250–470)
UIBC: 220 ug/dL (ref 125–400)

## 2014-06-30 LAB — FERRITIN: Ferritin: 130 ng/mL (ref 10–291)

## 2014-06-30 LAB — POCT HEMOGLOBIN-HEMACUE: Hemoglobin: 11.5 g/dL — ABNORMAL LOW (ref 12.0–15.0)

## 2014-06-30 MED ORDER — EPOETIN ALFA 10000 UNIT/ML IJ SOLN
10000.0000 [IU] | INTRAMUSCULAR | Status: DC
  Administered 2014-06-30: 10000 [IU] via SUBCUTANEOUS

## 2014-06-30 MED ORDER — EPOETIN ALFA 10000 UNIT/ML IJ SOLN
INTRAMUSCULAR | Status: AC
  Filled 2014-06-30: qty 1

## 2014-07-03 ENCOUNTER — Encounter (HOSPITAL_COMMUNITY): Payer: Self-pay | Admitting: Cardiology

## 2014-07-14 ENCOUNTER — Other Ambulatory Visit: Payer: Self-pay | Admitting: Internal Medicine

## 2014-07-15 NOTE — Telephone Encounter (Signed)
Needs appt for further refills.

## 2014-07-23 ENCOUNTER — Other Ambulatory Visit (INDEPENDENT_AMBULATORY_CARE_PROVIDER_SITE_OTHER): Payer: Medicare Other

## 2014-07-23 ENCOUNTER — Other Ambulatory Visit: Payer: Self-pay | Admitting: Internal Medicine

## 2014-07-23 ENCOUNTER — Ambulatory Visit (INDEPENDENT_AMBULATORY_CARE_PROVIDER_SITE_OTHER): Payer: Medicare Other | Admitting: Internal Medicine

## 2014-07-23 ENCOUNTER — Telehealth: Payer: Self-pay

## 2014-07-23 ENCOUNTER — Encounter: Payer: Self-pay | Admitting: Internal Medicine

## 2014-07-23 VITALS — BP 102/76 | HR 64 | Temp 98.2°F | Ht 63.0 in | Wt 172.0 lb

## 2014-07-23 DIAGNOSIS — E875 Hyperkalemia: Secondary | ICD-10-CM

## 2014-07-23 DIAGNOSIS — E1129 Type 2 diabetes mellitus with other diabetic kidney complication: Secondary | ICD-10-CM | POA: Diagnosis not present

## 2014-07-23 DIAGNOSIS — F0390 Unspecified dementia without behavioral disturbance: Secondary | ICD-10-CM | POA: Diagnosis not present

## 2014-07-23 DIAGNOSIS — N184 Chronic kidney disease, stage 4 (severe): Secondary | ICD-10-CM

## 2014-07-23 DIAGNOSIS — IMO0002 Reserved for concepts with insufficient information to code with codable children: Secondary | ICD-10-CM

## 2014-07-23 DIAGNOSIS — R195 Other fecal abnormalities: Secondary | ICD-10-CM

## 2014-07-23 DIAGNOSIS — E1165 Type 2 diabetes mellitus with hyperglycemia: Secondary | ICD-10-CM | POA: Diagnosis not present

## 2014-07-23 DIAGNOSIS — L280 Lichen simplex chronicus: Secondary | ICD-10-CM | POA: Diagnosis not present

## 2014-07-23 LAB — HEMOGLOBIN A1C: Hgb A1c MFr Bld: 13.8 % — ABNORMAL HIGH (ref 4.6–6.5)

## 2014-07-23 LAB — BASIC METABOLIC PANEL
BUN: 40 mg/dL — ABNORMAL HIGH (ref 6–23)
CALCIUM: 8.9 mg/dL (ref 8.4–10.5)
CO2: 25 mEq/L (ref 19–32)
Chloride: 106 mEq/L (ref 96–112)
Creatinine, Ser: 2.3 mg/dL — ABNORMAL HIGH (ref 0.4–1.2)
GFR: 26.01 mL/min — AB (ref >60.00)
GLUCOSE: 228 mg/dL — AB (ref 70–99)
Potassium: 6.2 mEq/L (ref 3.5–5.1)
Sodium: 137 mEq/L (ref 135–145)

## 2014-07-23 NOTE — Telephone Encounter (Signed)
Potassium very high ; may be lab error. STOP Losartan & recheck K+ in am. Order entered.

## 2014-07-23 NOTE — Progress Notes (Signed)
   Subjective:    Patient ID: [name], female    DOB: 03/20/1929, 80 y.o.   MRN: [id number]  HPI She describes intermittent loose stools;usually after a meal. This is been present for over a month.  There was no specific trigger for this. She specifically denies any suspicious food; antibiotic intake; or travel exposures. She has not been able to identify any wheat or gluten intolerance issues.  She emphatically stated (several times ) that she does not ingest milk or dairy.  She will occasionally have nausea and some abdominal discomfort. She also describes occasional dysphagia.  She has no other active GI symptoms    Review of Systems Unexplained weight loss,  significant dyspepsia,  melena, rectal bleeding, or persistently small caliber stools are denied. DM managed by Dr Cruzita Lederer; no A1c for 8 months. She is not on Metformin.Creatinine was 2.6 then also. She has forearm lesions she blames on possible drug reaction.     Objective:   Physical Exam Positive or pertinent findings include:  She appears much younger than her stated age. Affect is markedly flat and replies tend to be very short without significant elaboration. She has complete dentures. Dense , hyperpigmented granulomatous lesions with localized excoriations of forearms . There is slight tenting of the skin.  She had relates with a rolling walker.   General appearance :adequately nourished; in no distress. Eyes: No conjunctival inflammation or scleral icterus is present. Oral exam: Lips and gums are healthy appearing.There is no oropharyngeal erythema or exudate noted.  Heart:  Normal rate and regular rhythm. S1 and S2 normal without gallop, murmur, click, rub or other extra sounds   Lungs:Chest clear to auscultation; no wheezes, rhonchi,rales ,or rubs present.No increased work of breathing.  Abdomen: bowel sounds normal, soft and non-tender without masses, organomegaly or hernias noted.  No guarding or rebound.    Vascular : Carotid pulses equal ; no bruits present. Skin:Warm & dry.  Intact without suspicious lesions or rashes  Lymphatic: No lymphadenopathy is noted about the head, neck, axilla         Assessment & Plan:  #1 loose stool; ? IBS #2 ? 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

## 2014-07-23 NOTE — Telephone Encounter (Signed)
Phone call from [facility] lab stating patient's potassium is 6.2

## 2014-07-23 NOTE — Progress Notes (Signed)
Pre visit review using our clinic review tool, if applicable. No additional management support is needed unless otherwise documented below in the visit note. 

## 2014-07-23 NOTE — Patient Instructions (Addendum)
Please take a probiotic , Florastor OR Align, every day if the bowels are loose. This will replace the normal bacteria which  are necessary for formation of normal stool and processing of food.  Please keep a food diary of possible food triggers. If you have loose stool look @  the previous one to 2 meals. For example fatty or greasy foods might exacerbate gallbladder disease. Other food triggers for bowel dysfunction include lactose (milk sugar) or wheat / gluten .  Reflux of gastric acid may be asymptomatic as this may occur mainly during sleep.The triggers for reflux  include stress; the "aspirin family" ; alcohol; peppermint; and caffeine (coffee, tea, cola, and chocolate). The aspirin family would include aspirin and the nonsteroidal agents such as ibuprofen &  Naproxen. Tylenol would not cause reflux. If having symptoms ; food & drink should be avoided for @ least 2 hours before going to bed.

## 2014-07-24 NOTE — Telephone Encounter (Signed)
Pt has been informed of results. Pt will try to come in and pt stated understanding of the need to come in for a repeat potassium.

## 2014-07-28 ENCOUNTER — Encounter (HOSPITAL_COMMUNITY)
Admission: RE | Admit: 2014-07-28 | Discharge: 2014-07-28 | Disposition: A | Discharge status: Home / Self Care | Payer: Medicare Other | Source: Ambulatory Visit | Attending: Nephrology | Admitting: Nephrology

## 2014-07-28 DIAGNOSIS — N183 Chronic kidney disease, stage 3 (moderate): Secondary | ICD-10-CM | POA: Insufficient documentation

## 2014-07-28 DIAGNOSIS — D631 Anemia in chronic kidney disease: Secondary | ICD-10-CM | POA: Insufficient documentation

## 2014-07-28 LAB — POCT HEMOGLOBIN-HEMACUE: Hemoglobin: 11.4 g/dL — ABNORMAL LOW (ref 12.0–15.0)

## 2014-07-28 MED ORDER — EPOETIN ALFA 10000 UNIT/ML IJ SOLN
INTRAMUSCULAR | Status: AC
  Filled 2014-07-28: qty 1

## 2014-07-28 MED ORDER — EPOETIN ALFA 10000 UNIT/ML IJ SOLN
10000.0000 [IU] | INTRAMUSCULAR | Status: DC
  Administered 2014-07-28: 10000 [IU] via SUBCUTANEOUS

## 2014-08-01 NOTE — Telephone Encounter (Signed)
Contacted pt regarding potassium lab that needs to be completed.  Spoke to pt daughter and confirmed that pt has been taking the potassium and that pt needs to have the lab done again so that bp med assessment and/or adjustment can be made.  Dtr stated understanding.

## 2014-08-06 NOTE — Telephone Encounter (Signed)
Below note should state that it was confirmed that the pt is NOT taking the losartan.   Spoke to pt today and reiterated that importance of the potassium lab and that we needed her to go to the lab so that we can reassess and/or change the bp med.

## 2014-08-07 ENCOUNTER — Other Ambulatory Visit: Payer: Self-pay | Admitting: *Deleted

## 2014-08-07 ENCOUNTER — Other Ambulatory Visit (INDEPENDENT_AMBULATORY_CARE_PROVIDER_SITE_OTHER): Payer: Medicare Other

## 2014-08-07 ENCOUNTER — Telehealth: Payer: Self-pay

## 2014-08-07 DIAGNOSIS — N184 Chronic kidney disease, stage 4 (severe): Secondary | ICD-10-CM

## 2014-08-07 LAB — BASIC METABOLIC PANEL
BUN: 36 mg/dL — ABNORMAL HIGH (ref 6–23)
CO2: 24 mEq/L (ref 19–32)
Calcium: 8.3 mg/dL — ABNORMAL LOW (ref 8.4–10.5)
Chloride: 104 mEq/L (ref 96–112)
Creatinine, Ser: 2.15 mg/dL — ABNORMAL HIGH (ref 0.40–1.20)
GFR: 28.11 mL/min — ABNORMAL LOW (ref >60.00)
GLUCOSE: 585 mg/dL — AB (ref 70–99)
POTASSIUM: 4.9 meq/L (ref 3.5–5.1)
SODIUM: 133 meq/L — AB (ref 135–145)

## 2014-08-07 NOTE — Telephone Encounter (Signed)
Received a critical lab result from [facility]. Glucose 585.   Sent PCP a HIPPA compliant message and also spoke to Dr. Doug Sou to release results to me and advisement on best course of action.   Called pt and spoke to pt daughter. Gave dtr the results and dtr stated that pt had not completely fasted before the test. Stated that pt feels fine and acting normal and not in any distress.  Dtr also stated that sugar was below 110 this am however she could not remember the exact number. Informed dtr that potassium was normal and that I would send the message to PCP for recommendations.

## 2014-08-07 NOTE — Telephone Encounter (Signed)
Erroneous

## 2014-08-12 ENCOUNTER — Other Ambulatory Visit: Payer: Self-pay | Admitting: Internal Medicine

## 2014-08-13 NOTE — Telephone Encounter (Signed)
Needs appt for further refills.

## 2014-08-19 ENCOUNTER — Encounter: Payer: Self-pay | Admitting: Internal Medicine

## 2014-08-22 DIAGNOSIS — N189 Chronic kidney disease, unspecified: Secondary | ICD-10-CM | POA: Diagnosis not present

## 2014-08-22 DIAGNOSIS — D631 Anemia in chronic kidney disease: Secondary | ICD-10-CM | POA: Diagnosis not present

## 2014-08-22 DIAGNOSIS — N183 Chronic kidney disease, stage 3 (moderate): Secondary | ICD-10-CM | POA: Diagnosis not present

## 2014-08-22 DIAGNOSIS — N2581 Secondary hyperparathyroidism of renal origin: Secondary | ICD-10-CM | POA: Diagnosis not present

## 2014-08-25 ENCOUNTER — Encounter (HOSPITAL_COMMUNITY)
Admission: RE | Admit: 2014-08-25 | Discharge: 2014-08-25 | Disposition: A | Discharge status: Home / Self Care | Payer: Medicare Other | Source: Ambulatory Visit | Attending: Nephrology | Admitting: Nephrology

## 2014-08-25 DIAGNOSIS — N183 Chronic kidney disease, stage 3 (moderate): Secondary | ICD-10-CM | POA: Diagnosis not present

## 2014-08-25 DIAGNOSIS — D631 Anemia in chronic kidney disease: Secondary | ICD-10-CM | POA: Diagnosis not present

## 2014-08-25 LAB — POCT HEMOGLOBIN-HEMACUE: Hemoglobin: 11.1 g/dL — ABNORMAL LOW (ref 12.0–15.0)

## 2014-08-25 LAB — IRON AND TIBC
IRON: 106 ug/dL (ref 42–145)
Saturation Ratios: 37 % (ref 20–55)
TIBC: 285 ug/dL (ref 250–470)
UIBC: 179 ug/dL (ref 125–400)

## 2014-08-25 MED ORDER — EPOETIN ALFA 10000 UNIT/ML IJ SOLN
10000.0000 [IU] | INTRAMUSCULAR | Status: DC
  Administered 2014-08-25: 10000 [IU] via SUBCUTANEOUS

## 2014-08-25 MED ORDER — EPOETIN ALFA 10000 UNIT/ML IJ SOLN
INTRAMUSCULAR | Status: AC
  Filled 2014-08-25: qty 1

## 2014-08-26 LAB — FERRITIN: Ferritin: 201 ng/mL (ref 10–291)

## 2014-09-01 ENCOUNTER — Encounter: Payer: Self-pay | Admitting: Internal Medicine

## 2014-09-01 ENCOUNTER — Ambulatory Visit (INDEPENDENT_AMBULATORY_CARE_PROVIDER_SITE_OTHER): Payer: Medicare Other | Admitting: Internal Medicine

## 2014-09-01 VITALS — BP 122/64 | HR 75 | Temp 98.1°F | Resp 12 | Wt 167.0 lb

## 2014-09-01 DIAGNOSIS — E1165 Type 2 diabetes mellitus with hyperglycemia: Secondary | ICD-10-CM | POA: Diagnosis not present

## 2014-09-01 DIAGNOSIS — E1129 Type 2 diabetes mellitus with other diabetic kidney complication: Secondary | ICD-10-CM | POA: Diagnosis not present

## 2014-09-01 DIAGNOSIS — IMO0002 Reserved for concepts with insufficient information to code with codable children: Secondary | ICD-10-CM

## 2014-09-01 MED ORDER — INSULIN DETEMIR 100 UNIT/ML FLEXPEN: PEN_INJECTOR | SUBCUTANEOUS | Status: DC

## 2014-09-01 NOTE — Patient Instructions (Addendum)
Patient Instructions  Please continue Levemir and increase to 26 units in am Inject Regular insulin 8 units 30 min before every one of the 3 meals.   If you cannot take the insulin before a meal, take it the latest 30 min after the meal.  If you skip a meal, skip the regular insulin with that meal, also.  Please return in 1.5 month with your sugar log.

## 2014-09-01 NOTE — Progress Notes (Signed)
Patient ID: [name], female   DOB: [birth year], 81 y.o.   MRN: [id number]  Subjective:   HPI [name] is an 81 y.o. woman, returning for management of DM2, dx 1990, insulin-dependent, uncontrolled, with complications (CAD-status post MI in 2000 and 2011, PVD, CKD stage IV, peripheral neuropathy). Her daughter (Diane) lives with her, checks her sugars, and gives her insulin. She works nights. She accompanies pt today and offers most of the hx.  Pt returns after an absence of 6 months, previous visit was also after 79 months...  Her last hemoglobin A1c was: Lab Results  Component Value Date   HGBA1C 13.8* 07/23/2014   HGBA1C 11.2* 11/07/2013   HGBA1C 13.2* 05/22/2013  Previously: 11.9%, prev. 14.3% (08/24/2012).  She has decreased compliance due to dementia, mental confusion, and different caregivers.  She is on: - Levemir 23 units at bedtime >> in am - Regular 8 units tid (switched from NovoLog b/c her grazing habit) >> but she tells me she does not take 3 doses of insulin a day, but only once a day at 6 pm, while dinner is at 4 pm!  She has b'fast (8:30 am) and lunch (11:30 pm) at the center.  Patient checks her sugars 1-2x a day (no log).  - am: 150-400  (but mostly in the 200s) >> 200-300 >> 200s She has hypoglycemia awareness, but unclear at what value.  Apparently no lows; lowest 180s.  - She has chronic kidney disease, with her last BUN/creatinine: Lab Results  Component Value Date   BUN 36* 08/07/2014   Lab Results  Component Value Date   CREATININE 2.15* 08/07/2014   - She sees her eye dr. every year. Last visit in 2015. She had cataract sx in the past.  - She has numbness and tingling in her legs and also burning R>L.   I reviewed pt's medications, allergies, PMH, social hx, family hx, and changes were documented in the history of present illness. Otherwise, unchanged from my initial visit note.  Review of Systems Constitutional: no weight gain or loss, no fatigue,  + poor sleep Eyes: no blurry vision, no xerophthalmia ENT: no sore throat, no nodules palpated in throat, no dysphagia/odynophagia, no hoarseness Cardiovascular: no CP/ SOB/palpitations/eg swelling Respiratory: no cough/SOB Gastrointestinal: no N/V/D/C Musculoskeletal: no muscle/joint aches Skin: no rash  Objective:   Physical Exam BP 122/64 mmHg  Pulse 75  Temp(Src) 98.1 F (36.7 C) (Oral)  Resp 12  Wt 167 lb (75.751 kg)  SpO2 94% Body mass index is 29.59 kg/(m^2).  Wt Readings from Last 3 Encounters:  07/23/14 172 lb (78.019 kg)  12/05/13 173 lb 1.9 oz (78.527 kg)  11/26/13 175 lb (79.379 kg)   Constitutional: overweight, in NAD Eyes: PERRLA, EOMI, no exophthalmos ENT: moist mucous membranes, no thyromegaly, no cervical lymphadenopathy Cardiovascular: RRR, No MRG Respiratory: CTA B Gastrointestinal: abdomen soft, NT, ND, BS+ Musculoskeletal: no deformities, strength intact in all 4 Skin: moist, warm     Assessment:     1. DM2, insulin-dependent, uncontrolled, with complications - CAD-status post MI in 2000 and 2011 - PVD - CKD stage IV - Peripheral neuropathy  Plan:     1. The patient has a long history of DM2, complicated with chronic kidney disease, which limits our treatment choices to insulin. She again returns after a long absence. Her diabetes regimen is again not the one that I recommended at last visit >> will advise her to go back to that.   Patient Instructions  Please  continue Levemir and increase to 26 units in am Inject Regular insulin 8 units 30 min before every one of the 3 meals.   If you cannot take the insulin before a meal, take it the latest 30 min after the meal.  If you skip a meal, skip the regular insulin with that meal, also.  Please return in 1.5 month with your sugar log.    - No labs needed today >> we reviewed her last HbA1c, which is terrible. - given new logs >> check sugars 2-3x a day, before meals and at bedtime - hx  obtained from daughter >> pt does not check her sugars, does not give herself injections - I will see the patient back in 1.5 month with her log

## 2014-09-04 DIAGNOSIS — E785 Hyperlipidemia, unspecified: Secondary | ICD-10-CM | POA: Diagnosis not present

## 2014-09-04 DIAGNOSIS — I209 Angina pectoris, unspecified: Secondary | ICD-10-CM | POA: Diagnosis not present

## 2014-09-04 DIAGNOSIS — D649 Anemia, unspecified: Secondary | ICD-10-CM | POA: Diagnosis not present

## 2014-09-04 DIAGNOSIS — N189 Chronic kidney disease, unspecified: Secondary | ICD-10-CM | POA: Diagnosis not present

## 2014-09-04 DIAGNOSIS — E119 Type 2 diabetes mellitus without complications: Secondary | ICD-10-CM | POA: Diagnosis not present

## 2014-09-04 DIAGNOSIS — I2510 Atherosclerotic heart disease of native coronary artery without angina pectoris: Secondary | ICD-10-CM | POA: Diagnosis not present

## 2014-09-04 DIAGNOSIS — I10 Essential (primary) hypertension: Secondary | ICD-10-CM | POA: Diagnosis not present

## 2014-09-04 DIAGNOSIS — I252 Old myocardial infarction: Secondary | ICD-10-CM | POA: Diagnosis not present

## 2014-09-12 ENCOUNTER — Other Ambulatory Visit: Payer: Self-pay | Admitting: Internal Medicine

## 2014-09-19 ENCOUNTER — Other Ambulatory Visit (HOSPITAL_COMMUNITY): Payer: Self-pay | Admitting: *Deleted

## 2014-09-19 ENCOUNTER — Emergency Department (HOSPITAL_COMMUNITY): Payer: Medicare Other

## 2014-09-19 ENCOUNTER — Encounter (HOSPITAL_COMMUNITY): Payer: Self-pay | Admitting: *Deleted

## 2014-09-19 ENCOUNTER — Emergency Department (HOSPITAL_COMMUNITY)
Admission: EM | Admit: 2014-09-19 | Discharge: 2014-09-19 | Disposition: A | Discharge status: Home / Self Care | Payer: Medicare Other | Attending: Emergency Medicine | Admitting: Emergency Medicine

## 2014-09-19 DIAGNOSIS — R918 Other nonspecific abnormal finding of lung field: Secondary | ICD-10-CM | POA: Insufficient documentation

## 2014-09-19 DIAGNOSIS — R0789 Other chest pain: Secondary | ICD-10-CM | POA: Diagnosis not present

## 2014-09-19 DIAGNOSIS — R938 Abnormal findings on diagnostic imaging of other specified body structures: Secondary | ICD-10-CM | POA: Diagnosis not present

## 2014-09-19 DIAGNOSIS — Z9861 Coronary angioplasty status: Secondary | ICD-10-CM | POA: Diagnosis not present

## 2014-09-19 DIAGNOSIS — Z951 Presence of aortocoronary bypass graft: Secondary | ICD-10-CM | POA: Diagnosis not present

## 2014-09-19 DIAGNOSIS — E780 Pure hypercholesterolemia: Secondary | ICD-10-CM | POA: Insufficient documentation

## 2014-09-19 DIAGNOSIS — E119 Type 2 diabetes mellitus without complications: Secondary | ICD-10-CM | POA: Diagnosis not present

## 2014-09-19 DIAGNOSIS — F329 Major depressive disorder, single episode, unspecified: Secondary | ICD-10-CM | POA: Insufficient documentation

## 2014-09-19 DIAGNOSIS — F0390 Unspecified dementia without behavioral disturbance: Secondary | ICD-10-CM | POA: Diagnosis not present

## 2014-09-19 DIAGNOSIS — Z87891 Personal history of nicotine dependence: Secondary | ICD-10-CM | POA: Insufficient documentation

## 2014-09-19 DIAGNOSIS — Z853 Personal history of malignant neoplasm of breast: Secondary | ICD-10-CM | POA: Diagnosis not present

## 2014-09-19 DIAGNOSIS — Z792 Long term (current) use of antibiotics: Secondary | ICD-10-CM | POA: Insufficient documentation

## 2014-09-19 DIAGNOSIS — R9389 Abnormal findings on diagnostic imaging of other specified body structures: Secondary | ICD-10-CM

## 2014-09-19 DIAGNOSIS — Z8669 Personal history of other diseases of the nervous system and sense organs: Secondary | ICD-10-CM | POA: Diagnosis not present

## 2014-09-19 DIAGNOSIS — Z7982 Long term (current) use of aspirin: Secondary | ICD-10-CM | POA: Insufficient documentation

## 2014-09-19 DIAGNOSIS — I25119 Atherosclerotic heart disease of native coronary artery with unspecified angina pectoris: Secondary | ICD-10-CM | POA: Diagnosis not present

## 2014-09-19 DIAGNOSIS — R079 Chest pain, unspecified: Secondary | ICD-10-CM | POA: Diagnosis not present

## 2014-09-19 DIAGNOSIS — I129 Hypertensive chronic kidney disease with stage 1 through stage 4 chronic kidney disease, or unspecified chronic kidney disease: Secondary | ICD-10-CM | POA: Insufficient documentation

## 2014-09-19 DIAGNOSIS — Z9889 Other specified postprocedural states: Secondary | ICD-10-CM | POA: Insufficient documentation

## 2014-09-19 DIAGNOSIS — N184 Chronic kidney disease, stage 4 (severe): Secondary | ICD-10-CM | POA: Diagnosis not present

## 2014-09-19 DIAGNOSIS — Z794 Long term (current) use of insulin: Secondary | ICD-10-CM | POA: Insufficient documentation

## 2014-09-19 DIAGNOSIS — D649 Anemia, unspecified: Secondary | ICD-10-CM | POA: Diagnosis not present

## 2014-09-19 DIAGNOSIS — Z79899 Other long term (current) drug therapy: Secondary | ICD-10-CM | POA: Insufficient documentation

## 2014-09-19 LAB — CBC WITH DIFFERENTIAL/PLATELET
BASOS ABS: 0 10*3/uL (ref 0.0–0.1)
Basophils Relative: 0 % (ref 0–1)
EOS ABS: 0.2 10*3/uL (ref 0.0–0.7)
Eosinophils Relative: 2 % (ref 0–5)
HCT: 34.8 % — ABNORMAL LOW (ref 36.0–46.0)
Hemoglobin: 11.6 g/dL — ABNORMAL LOW (ref 12.0–15.0)
LYMPHS ABS: 2.6 10*3/uL (ref 0.7–4.0)
LYMPHS PCT: 41 % (ref 12–46)
MCH: 30.8 pg (ref 26.0–34.0)
MCHC: 33.3 g/dL (ref 30.0–36.0)
MCV: 92.3 fL (ref 78.0–100.0)
Monocytes Absolute: 0.3 10*3/uL (ref 0.1–1.0)
Monocytes Relative: 5 % (ref 3–12)
Neutro Abs: 3.3 10*3/uL (ref 1.7–7.7)
Neutrophils Relative %: 52 % (ref 43–77)
Platelets: 248 10*3/uL (ref 150–400)
RBC: 3.77 MIL/uL — AB (ref 3.87–5.11)
RDW: 13.3 % (ref 11.5–15.5)
WBC: 6.3 10*3/uL (ref 4.0–10.5)

## 2014-09-19 LAB — COMPREHENSIVE METABOLIC PANEL
ALT: 15 U/L (ref 0–35)
ANION GAP: 9 (ref 5–15)
AST: 19 U/L (ref 0–37)
Albumin: 3.3 g/dL — ABNORMAL LOW (ref 3.5–5.2)
Alkaline Phosphatase: 250 U/L — ABNORMAL HIGH (ref 39–117)
BILIRUBIN TOTAL: 0.4 mg/dL (ref 0.3–1.2)
BUN: 39 mg/dL — AB (ref 6–23)
CHLORIDE: 105 mmol/L (ref 96–112)
CO2: 19 mmol/L (ref 19–32)
Calcium: 8.2 mg/dL — ABNORMAL LOW (ref 8.4–10.5)
Creatinine, Ser: 2.35 mg/dL — ABNORMAL HIGH (ref 0.50–1.10)
GFR calc Af Amer: 21 mL/min — ABNORMAL LOW (ref >90)
GFR calc non Af Amer: 18 mL/min — ABNORMAL LOW (ref >90)
Glucose, Bld: 384 mg/dL — ABNORMAL HIGH (ref 70–99)
Potassium: 4.5 mmol/L (ref 3.5–5.1)
Sodium: 133 mmol/L — ABNORMAL LOW (ref 135–145)
Total Protein: 7.5 g/dL (ref 6.0–8.3)

## 2014-09-19 LAB — I-STAT TROPONIN, ED: TROPONIN I, POC: 0 ng/mL (ref 0.00–0.08)

## 2014-09-19 LAB — TROPONIN I: Troponin I: <0.03 ng/mL (ref <0.031)

## 2014-09-19 MED ORDER — HYDROCODONE-ACETAMINOPHEN 5-325 MG PO TABS: 1.0000 | ORAL_TABLET | ORAL | Status: DC | PRN

## 2014-09-19 MED ORDER — LEVOFLOXACIN 500 MG PO TABS
500.0000 mg | ORAL_TABLET | Freq: Once | ORAL | Status: AC
  Administered 2014-09-19: 500 mg via ORAL
  Filled 2014-09-19: qty 1

## 2014-09-19 MED ORDER — GI COCKTAIL ~~LOC~~
30.0000 mL | Freq: Once | ORAL | Status: AC
  Administered 2014-09-19: 30 mL via ORAL
  Filled 2014-09-19: qty 30

## 2014-09-19 MED ORDER — MORPHINE SULFATE 4 MG/ML IJ SOLN
6.0000 mg | Freq: Once | INTRAMUSCULAR | Status: AC
  Administered 2014-09-19: 6 mg via INTRAVENOUS
  Filled 2014-09-19: qty 2

## 2014-09-19 MED ORDER — LEVOFLOXACIN 500 MG PO TABS: 500.0000 mg | ORAL_TABLET | Freq: Every day | ORAL | Status: DC

## 2014-09-19 NOTE — ED Provider Notes (Signed)
CSN: [id number]     Arrival date & time 09/19/14  0101 History  This chart was scribed for Hoy Morn, MD by Delphia Grates, ED Scribe. This patient was seen in room A08C/A08C and the patient's care was started at 1:34 AM.   Chief Complaint  Patient presents with  . Chest Pain    The history is provided by the patient. No language interpreter was used.     HPI Comments: [name] is a 81 y.o. female, with history of CKD (stage IV) DM, HTN, CAD with stent placement, and MI who presents to the Emergency Department complaining of sharp, right sided chest pain that began 2 hours ago. She reports history of similar pain when she had an MI. She denies any strenuous activity or recent illness. Patient has past surgical history of CABG. She denies SOB, diaphoresis, nausea.  Patient reports cough.  She reports her pain is worse with movement and palpation of her left anterior chest.  Patient denies new rash.  No recent injury or trauma.  No recent exercise.  Denies abdominal pain.  No back pain flank pain.  No neck or jaw pain.   Past Medical History  Diagnosis Date  . 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 ANEMIA   . DEPRESSION   . HYPERTENSION   . PVD   . INSOMNIA, CHRONIC   . Dementia   . DIABETES MELLITUS, TYPE II     renal, neuro - peripheral neuropathy  . Hypercholesteremia   . Chronic kidney disease (CKD), stage IV (severe)   . Anemia of chronic disease   . Angina   . Non-Q wave myocardial infarction 10/2009    /E-chart  . Inferior MI 2000    "quiet"  . Breast cancer 2000    right   Past Surgical History  Procedure Laterality Date  . Capsulectomy  12/27/2010    L eye  . Breast lumpectomy  2000    right  . Cataract extraction w/ intraocular lens  implant, bilateral    . Coronary artery bypass graft  2000    CABG X1  . Coronary angioplasty with stent placement      "twice"  . Eye surgery      Cataracts (R) eye 06/1999 & (L) eye 09/1999- [facility]  . Combined  hysteroscopy diagnostic / d&c  07/2004    /e-chart  . Dilation and curettage of uterus    . Cardiac surgery    . Left heart catheterization with coronary angiogram N/A 11/22/2011    Procedure: LEFT HEART CATHETERIZATION WITH CORONARY ANGIOGRAM;  Surgeon: Clent Demark, MD;  Location: [facility] CATH LAB;  Service: Cardiovascular;  Laterality: N/A;  . Left heart catheterization with coronary angiogram N/A 10/03/2012    Procedure: LEFT HEART CATHETERIZATION WITH CORONARY ANGIOGRAM;  Surgeon: Clent Demark, MD;  Location: [facility] CATH LAB;  Service: Cardiovascular;  Laterality: N/A;   Family History  Problem Relation Age of Onset  . Coronary artery disease Father    History  Substance Use Topics  . Smoking status: Former Smoker -- 1.00 packs/day for 20 years    Types: Cigarettes  . Smokeless tobacco: Never Used     Comment: 11/21/11 "quit smoking 30-40 years ago  . Alcohol Use: Yes     Comment: 11/21/11 "used to drink quit alot; last drink of alcohol maybe 30 years ago"   OB History    No data available     Review of Systems  A complete 10 system review of systems was obtained and all systems are negative except as noted in the HPI and PMH.    Allergies  Ace inhibitors  Home Medications   Prior to Admission medications   Medication Sig Start Date End Date Taking? Authorizing Provider  amLODipine (NORVASC) 5 MG tablet Take 1 tablet (5 mg total) by mouth daily. 06/18/14   Rowe Clack, MD  aspirin 81 MG tablet Take 1 tablet (81 mg total) by mouth daily. 11/23/11   Clent Demark, MD  atorvastatin (LIPITOR) 20 MG tablet TAKE 1 TABLET BY MOUTH EVERY DAY 05/23/14   Rowe Clack, MD  Calcium Carbonate-Vitamin D 600-400 MG-UNIT per tablet Take 1 tablet by mouth 2 (two) times daily. 05/18/12   Rowe Clack, MD  clopidogrel (PLAVIX) 75 MG tablet TAKE 1 TABLET BY MOUTH DAILY 05/23/14   Rowe Clack, MD  donepezil (ARICEPT) 10 MG tablet Take 1 tablet (10 mg total) by mouth at  bedtime. 09/13/14   Rowe Clack, MD  EASY TOUCH PEN NEEDLES 31G X 8 MM MISC USE AS DIRECTED TO INJECT INSULIN EVERY DAY 11/20/13   Rowe Clack, MD  ferrous sulfate 325 (65 FE) MG tablet Take 325 mg by mouth daily with breakfast. 05/18/12   Rowe Clack, MD  furosemide (LASIX) 40 MG tablet TAKE ONE TABLET BY MOUTH TWICE DAILY 07/15/14   Rowe Clack, MD  HUMULIN R 100 UNIT/ML injection INJECT 8 UNITS SUBCUTANEOUSLY THREE TIMES A DAY (BEFORE MEALS) 09/13/14   Philemon Kingdom, MD  hydrOXYzine (ATARAX/VISTARIL) 10 MG tablet Take 1 tablet (10 mg total) by mouth 3 (three) times daily as needed for itching. 04/17/13   Rowe Clack, MD  Insulin Detemir (LEVEMIR FLEXTOUCH) 100 UNIT/ML Pen INJECT 26 UNITS SUBCUTANEOUSLY EVERY NIGHT AT BEDTIME 09/01/14   Philemon Kingdom, MD  isosorbide mononitrate (IMDUR) 60 MG 24 hr tablet Take 60 mg by mouth daily.    Historical Provider, MD  losartan (COZAAR) 50 MG tablet Take 1 tablet (50 mg total) by mouth daily. 10/04/12   Clent Demark, MD  nitroGLYCERIN (NITROSTAT) 0.4 MG SL tablet Place 1 tablet (0.4 mg total) under the tongue every 5 (five) minutes x 3 doses as needed for chest pain. 11/23/11 11/07/13  Clent Demark, MD  pantoprazole (PROTONIX) 40 MG tablet Take 1 tablet (40 mg total) by mouth daily at 6 (six) AM. 10/04/12   Clent Demark, MD  PARoxetine (PAXIL) 10 MG tablet Take 1 tablet (10 mg total) by mouth at bedtime. 09/13/14   Rowe Clack, MD  pregabalin (LYRICA) 75 MG capsule Take 1 capsule (75 mg total) by mouth 2 (two) times daily. 06/25/14   Rowe Clack, MD  traMADol (ULTRAM) 50 MG tablet Take 1 tablet (50 mg total) by mouth every 8 (eight) hours as needed. 12/05/13   Hendricks Limes, MD  triamcinolone cream (KENALOG) 0.1 % Apply topically 2 (two) times daily. 08/13/14   Rowe Clack, MD   Triage Vitals: BP 141/68 mmHg  Pulse 68  Temp(Src) 97.9 F (36.6 C) (Oral)  Resp 14  Ht 5\' 5"  (1.651 m)  Wt 170 lb  (77.111 kg)  BMI 28.29 kg/m2  SpO2 99%  Physical Exam  Constitutional: She is oriented to person, place, and time. She appears well-developed and well-nourished. No distress.  HENT:  Head: Normocephalic and atraumatic.  Eyes: EOM are normal.  Neck: Normal range of motion.  Cardiovascular: Normal rate,  regular rhythm and normal heart sounds.   Pulmonary/Chest: Effort normal and breath sounds normal. No respiratory distress. She has no wheezes.  Tenderness to anterior chest on the left anterior sternal border.  No rash noted in this region.  Abdominal: Soft. She exhibits no distension. There is no tenderness.  Musculoskeletal: Normal range of motion.  Neurological: She is alert and oriented to person, place, and time.  Skin: Skin is warm and dry. No rash noted.  Psychiatric: She has a normal mood and affect. Judgment normal.  Nursing note and vitals reviewed.   ED Course  Procedures (including critical care time)  DIAGNOSTIC STUDIES: Oxygen Saturation is 99% on room air, normal by my interpretation.    COORDINATION OF CARE: At 0138 Discussed treatment plan with patient. Patient agrees.   At 0317 Upon re-evaluation, patient's chest pain has improved with pain medication. Will order second troponin at 3 hours.  Labs Review Labs Reviewed  CBC WITH DIFFERENTIAL/PLATELET - Abnormal; Notable for the following:    RBC 3.77 (*)    Hemoglobin 11.6 (*)    HCT 34.8 (*)    All other components within normal limits  COMPREHENSIVE METABOLIC PANEL - Abnormal; Notable for the following:    Sodium 133 (*)    Glucose, Bld 384 (*)    BUN 39 (*)    Creatinine, Ser 2.35 (*)    Calcium 8.2 (*)    Albumin 3.3 (*)    Alkaline Phosphatase 250 (*)    GFR calc non Af Amer 18 (*)    GFR calc Af Amer 21 (*)    All other components within normal limits  TROPONIN I  I-STAT TROPOININ, ED   BUN  Date Value Ref Range Status  09/19/2014 39* 6 - 23 mg/dL Final  08/07/2014 36* 6 - 23 mg/dL Final   07/23/2014 40* 6 - 23 mg/dL Final  11/07/2013 55* 6 - 23 mg/dL Final   CREATININE, SER  Date Value Ref Range Status  09/19/2014 2.35* 0.50 - 1.10 mg/dL Final  08/07/2014 2.15* 0.40 - 1.20 mg/dL Final  07/23/2014 2.3* 0.4 - 1.2 mg/dL Final  11/07/2013 2.6* 0.4 - 1.2 mg/dL Final       Imaging Review Dg Chest 2 View  09/19/2014   CLINICAL DATA:  Chest pain for 10 hours. Bilateral pain along lower ribs.  EXAM: CHEST  2 VIEW  COMPARISON:  10/01/2012  FINDINGS: Lower lung volumes from prior. Cardiomegaly appears more prominent, may be related to low lung volumes versus true progression. Vascular congestion is again seen. There is underlying interstitial prominence. Development of clustered nodular opacities in the right perihilar lung. No large pleural effusion, minimal blunting of the costophrenic sulcus. No confluent airspace disease. Pneumothorax. No acute osseous abnormalities are seen.  IMPRESSION: 1. Cardiomegaly, question progressed from prior exam versus related to lower lung volumes. 2. Small clustered nodular densities in the right midlung zone. This is likely infectious or inflammatory in etiology. Continued radiographic follow-up is recommended after course of treatment to insure resolution.   Electronically Signed   By: Jeb Levering M.D.   On: 09/19/2014 05:09     EKG Interpretation   Date/Time:  Friday September 19 2014 01:07:10 EST Ventricular Rate:  68 PR Interval:  164 QRS Duration: 86 QT Interval:  426 QTC Calculation: 452 R Axis:   -33 Text Interpretation:  Normal sinus rhythm Left axis deviation Minimal  voltage criteria for LVH, may be normal variant No significant change was  found Confirmed by  Britiney Blahnik  MD, Lennette Bihari ([id number]) on 09/19/2014 2:12:23 AM      MDM   Final diagnoses:  Chest pain, unspecified chest pain type  Abnormal chest x-ray    Atypical chest pain.  This seems to be reproducible on examination.  EKG without acute ischemic changes.  Troponin  negative 2.  Chest x-ray questions a small inflammatory or infectious possibility.  Patient has had cough.  She'll be placed on Levaquin.  I've asked that she follow-up with her primary care physician.  In the meantime her pain is improved with pain medication.  Home with a short course of pain medication.  Patient understands return to the ER for new or worsening symptoms.  This was described to the patient and the patient's family.  All questions answered.  I personally performed the services described in this documentation, which was scribed in my presence. The recorded information has been reviewed and is accurate.      Hoy Morn, MD 09/19/14 [phone]

## 2014-09-19 NOTE — Discharge Instructions (Signed)

## 2014-09-19 NOTE — ED Notes (Signed)
MD at bedside and spoke with patient and family about current plan of care.

## 2014-09-19 NOTE — ED Notes (Signed)
Patient presents with c/o right sided CP that started a couple of hours ago. States she had a MI in the past and does not remember if the pain was the same

## 2014-09-22 ENCOUNTER — Inpatient Hospital Stay (HOSPITAL_COMMUNITY): Admission: RE | Admit: 2014-09-22 | Payer: Medicaid Other | Source: Ambulatory Visit

## 2014-10-02 ENCOUNTER — Other Ambulatory Visit (HOSPITAL_COMMUNITY): Payer: Self-pay | Admitting: *Deleted

## 2014-10-03 ENCOUNTER — Encounter (HOSPITAL_COMMUNITY)
Admission: RE | Admit: 2014-10-03 | Discharge: 2014-10-03 | Disposition: A | Discharge status: Home / Self Care | Payer: Medicare Other | Source: Ambulatory Visit | Attending: Nephrology | Admitting: Nephrology

## 2014-10-03 DIAGNOSIS — N183 Chronic kidney disease, stage 3 (moderate): Secondary | ICD-10-CM | POA: Insufficient documentation

## 2014-10-03 DIAGNOSIS — D631 Anemia in chronic kidney disease: Secondary | ICD-10-CM | POA: Diagnosis not present

## 2014-10-03 LAB — POCT HEMOGLOBIN-HEMACUE: Hemoglobin: 11.2 g/dL — ABNORMAL LOW (ref 12.0–15.0)

## 2014-10-03 MED ORDER — EPOETIN ALFA 10000 UNIT/ML IJ SOLN
INTRAMUSCULAR | Status: AC
Start: 2014-10-03 — End: 2014-10-03
  Administered 2014-10-03: 10000 [IU] via SUBCUTANEOUS
  Filled 2014-10-03: qty 1

## 2014-10-03 MED ORDER — EPOETIN ALFA 10000 UNIT/ML IJ SOLN
10000.0000 [IU] | INTRAMUSCULAR | Status: DC
  Administered 2014-10-03: 10000 [IU] via SUBCUTANEOUS

## 2014-10-08 ENCOUNTER — Telehealth: Payer: Self-pay

## 2014-10-08 NOTE — Telephone Encounter (Signed)
Left message with patients daughter to call us back if patient wants to get flu vaccine this season

## 2014-10-24 ENCOUNTER — Other Ambulatory Visit: Payer: Self-pay | Admitting: Internal Medicine

## 2014-10-24 DIAGNOSIS — H16223 Keratoconjunctivitis sicca, not specified as Sjogren's, bilateral: Secondary | ICD-10-CM | POA: Diagnosis not present

## 2014-10-24 DIAGNOSIS — E11329 Type 2 diabetes mellitus with mild nonproliferative diabetic retinopathy without macular edema: Secondary | ICD-10-CM | POA: Diagnosis not present

## 2014-10-24 LAB — HM DIABETES EYE EXAM

## 2014-10-31 ENCOUNTER — Encounter (HOSPITAL_COMMUNITY)
Admission: RE | Admit: 2014-10-31 | Discharge: 2014-10-31 | Disposition: A | Discharge status: Home / Self Care | Payer: Medicare Other | Source: Ambulatory Visit | Attending: Nephrology | Admitting: Nephrology

## 2014-10-31 DIAGNOSIS — D631 Anemia in chronic kidney disease: Secondary | ICD-10-CM | POA: Insufficient documentation

## 2014-10-31 DIAGNOSIS — N183 Chronic kidney disease, stage 3 (moderate): Secondary | ICD-10-CM | POA: Insufficient documentation

## 2014-10-31 LAB — IRON AND TIBC
Iron: 66 ug/dL (ref 42–145)
SATURATION RATIOS: 24 % (ref 20–55)
TIBC: 271 ug/dL (ref 250–470)
UIBC: 205 ug/dL (ref 125–400)

## 2014-10-31 LAB — POCT HEMOGLOBIN-HEMACUE: Hemoglobin: 11.5 g/dL — ABNORMAL LOW (ref 12.0–15.0)

## 2014-10-31 LAB — FERRITIN: Ferritin: 186 ng/mL (ref 10–291)

## 2014-10-31 MED ORDER — EPOETIN ALFA 10000 UNIT/ML IJ SOLN
INTRAMUSCULAR | Status: AC
  Filled 2014-10-31: qty 1

## 2014-10-31 MED ORDER — EPOETIN ALFA 10000 UNIT/ML IJ SOLN
10000.0000 [IU] | INTRAMUSCULAR | Status: DC
  Administered 2014-10-31: 10000 [IU] via SUBCUTANEOUS

## 2014-11-18 ENCOUNTER — Emergency Department (HOSPITAL_COMMUNITY): Payer: Medicare Other

## 2014-11-18 ENCOUNTER — Encounter (HOSPITAL_COMMUNITY): Payer: Self-pay | Admitting: Cardiology

## 2014-11-18 ENCOUNTER — Inpatient Hospital Stay (HOSPITAL_COMMUNITY)
Admission: EM | Admit: 2014-11-18 | Discharge: 2014-11-21 | DRG: 641 | Disposition: A | Discharge status: Home / Self Care | Payer: Medicare Other | Attending: Internal Medicine | Admitting: Internal Medicine

## 2014-11-18 DIAGNOSIS — Z9841 Cataract extraction status, right eye: Secondary | ICD-10-CM

## 2014-11-18 DIAGNOSIS — E1165 Type 2 diabetes mellitus with hyperglycemia: Secondary | ICD-10-CM | POA: Diagnosis present

## 2014-11-18 DIAGNOSIS — E780 Pure hypercholesterolemia: Secondary | ICD-10-CM | POA: Diagnosis present

## 2014-11-18 DIAGNOSIS — F0390 Unspecified dementia without behavioral disturbance: Secondary | ICD-10-CM | POA: Diagnosis present

## 2014-11-18 DIAGNOSIS — E1122 Type 2 diabetes mellitus with diabetic chronic kidney disease: Secondary | ICD-10-CM | POA: Diagnosis present

## 2014-11-18 DIAGNOSIS — N289 Disorder of kidney and ureter, unspecified: Secondary | ICD-10-CM | POA: Diagnosis not present

## 2014-11-18 DIAGNOSIS — IMO0002 Reserved for concepts with insufficient information to code with codable children: Secondary | ICD-10-CM

## 2014-11-18 DIAGNOSIS — E1140 Type 2 diabetes mellitus with diabetic neuropathy, unspecified: Secondary | ICD-10-CM | POA: Diagnosis present

## 2014-11-18 DIAGNOSIS — G4700 Insomnia, unspecified: Secondary | ICD-10-CM

## 2014-11-18 DIAGNOSIS — Z955 Presence of coronary angioplasty implant and graft: Secondary | ICD-10-CM | POA: Diagnosis not present

## 2014-11-18 DIAGNOSIS — I129 Hypertensive chronic kidney disease with stage 1 through stage 4 chronic kidney disease, or unspecified chronic kidney disease: Secondary | ICD-10-CM | POA: Diagnosis present

## 2014-11-18 DIAGNOSIS — F329 Major depressive disorder, single episode, unspecified: Secondary | ICD-10-CM | POA: Diagnosis present

## 2014-11-18 DIAGNOSIS — I10 Essential (primary) hypertension: Secondary | ICD-10-CM | POA: Diagnosis present

## 2014-11-18 DIAGNOSIS — E785 Hyperlipidemia, unspecified: Secondary | ICD-10-CM | POA: Diagnosis present

## 2014-11-18 DIAGNOSIS — Z7982 Long term (current) use of aspirin: Secondary | ICD-10-CM

## 2014-11-18 DIAGNOSIS — Z961 Presence of intraocular lens: Secondary | ICD-10-CM | POA: Diagnosis present

## 2014-11-18 DIAGNOSIS — Z794 Long term (current) use of insulin: Secondary | ICD-10-CM

## 2014-11-18 DIAGNOSIS — E1129 Type 2 diabetes mellitus with other diabetic kidney complication: Secondary | ICD-10-CM | POA: Diagnosis present

## 2014-11-18 DIAGNOSIS — N390 Urinary tract infection, site not specified: Secondary | ICD-10-CM | POA: Diagnosis present

## 2014-11-18 DIAGNOSIS — R239 Unspecified skin changes: Secondary | ICD-10-CM

## 2014-11-18 DIAGNOSIS — Z87891 Personal history of nicotine dependence: Secondary | ICD-10-CM | POA: Diagnosis not present

## 2014-11-18 DIAGNOSIS — Z951 Presence of aortocoronary bypass graft: Secondary | ICD-10-CM | POA: Diagnosis not present

## 2014-11-18 DIAGNOSIS — I517 Cardiomegaly: Secondary | ICD-10-CM | POA: Diagnosis not present

## 2014-11-18 DIAGNOSIS — D638 Anemia in other chronic diseases classified elsewhere: Secondary | ICD-10-CM | POA: Diagnosis present

## 2014-11-18 DIAGNOSIS — R609 Edema, unspecified: Secondary | ICD-10-CM

## 2014-11-18 DIAGNOSIS — R55 Syncope and collapse: Secondary | ICD-10-CM | POA: Diagnosis not present

## 2014-11-18 DIAGNOSIS — I252 Old myocardial infarction: Secondary | ICD-10-CM | POA: Diagnosis not present

## 2014-11-18 DIAGNOSIS — I2510 Atherosclerotic heart disease of native coronary artery without angina pectoris: Secondary | ICD-10-CM | POA: Diagnosis present

## 2014-11-18 DIAGNOSIS — G629 Polyneuropathy, unspecified: Secondary | ICD-10-CM | POA: Diagnosis present

## 2014-11-18 DIAGNOSIS — Z853 Personal history of malignant neoplasm of breast: Secondary | ICD-10-CM

## 2014-11-18 DIAGNOSIS — E860 Dehydration: Secondary | ICD-10-CM | POA: Diagnosis not present

## 2014-11-18 DIAGNOSIS — Z9842 Cataract extraction status, left eye: Secondary | ICD-10-CM | POA: Diagnosis not present

## 2014-11-18 DIAGNOSIS — Z7901 Long term (current) use of anticoagulants: Secondary | ICD-10-CM

## 2014-11-18 DIAGNOSIS — I200 Unstable angina: Secondary | ICD-10-CM

## 2014-11-18 DIAGNOSIS — I739 Peripheral vascular disease, unspecified: Secondary | ICD-10-CM | POA: Diagnosis present

## 2014-11-18 DIAGNOSIS — N184 Chronic kidney disease, stage 4 (severe): Secondary | ICD-10-CM | POA: Diagnosis not present

## 2014-11-18 DIAGNOSIS — R404 Transient alteration of awareness: Secondary | ICD-10-CM | POA: Diagnosis not present

## 2014-11-18 DIAGNOSIS — E1101 Type 2 diabetes mellitus with hyperosmolarity with coma: Secondary | ICD-10-CM

## 2014-11-18 DIAGNOSIS — R42 Dizziness and giddiness: Secondary | ICD-10-CM | POA: Diagnosis not present

## 2014-11-18 DIAGNOSIS — R531 Weakness: Secondary | ICD-10-CM | POA: Diagnosis not present

## 2014-11-18 HISTORY — DX: Lichen simplex chronicus: L28.0

## 2014-11-18 HISTORY — DX: Other fecal abnormalities: R19.5

## 2014-11-18 LAB — CBC
HEMATOCRIT: 35.3 % — AB (ref 36.0–46.0)
HEMOGLOBIN: 11.7 g/dL — AB (ref 12.0–15.0)
MCH: 29.5 pg (ref 26.0–34.0)
MCHC: 33.1 g/dL (ref 30.0–36.0)
MCV: 89.1 fL (ref 78.0–100.0)
Platelets: 260 10*3/uL (ref 150–400)
RBC: 3.96 MIL/uL (ref 3.87–5.11)
RDW: 12.8 % (ref 11.5–15.5)
WBC: 5.8 10*3/uL (ref 4.0–10.5)

## 2014-11-18 LAB — BASIC METABOLIC PANEL
ANION GAP: 13 (ref 5–15)
BUN: 55 mg/dL — AB (ref 6–23)
CO2: 20 mmol/L (ref 19–32)
CREATININE: 2.89 mg/dL — AB (ref 0.50–1.10)
Calcium: 8.6 mg/dL (ref 8.4–10.5)
Chloride: 100 mmol/L (ref 96–112)
GFR calc Af Amer: 16 mL/min — ABNORMAL LOW (ref >90)
GFR, EST NON AFRICAN AMERICAN: 14 mL/min — AB (ref >90)
Glucose, Bld: 277 mg/dL — ABNORMAL HIGH (ref 70–99)
POTASSIUM: 4.4 mmol/L (ref 3.5–5.1)
SODIUM: 133 mmol/L — AB (ref 135–145)

## 2014-11-18 LAB — GLUCOSE, CAPILLARY: Glucose-Capillary: 148 mg/dL — ABNORMAL HIGH (ref 70–99)

## 2014-11-18 LAB — URINALYSIS, ROUTINE W REFLEX MICROSCOPIC
Bilirubin Urine: NEGATIVE
Glucose, UA: 250 mg/dL — AB
HGB URINE DIPSTICK: NEGATIVE
Ketones, ur: NEGATIVE mg/dL
Leukocytes, UA: NEGATIVE
Nitrite: NEGATIVE
Protein, ur: NEGATIVE mg/dL
Specific Gravity, Urine: 1.013 (ref 1.005–1.030)
UROBILINOGEN UA: 0.2 mg/dL (ref 0.0–1.0)
pH: 5 (ref 5.0–8.0)

## 2014-11-18 LAB — CBC WITH DIFFERENTIAL/PLATELET
Basophils Absolute: 0 10*3/uL (ref 0.0–0.1)
Basophils Relative: 0 % (ref 0–1)
EOS ABS: 0.1 10*3/uL (ref 0.0–0.7)
Eosinophils Relative: 2 % (ref 0–5)
HEMATOCRIT: 34.6 % — AB (ref 36.0–46.0)
HEMOGLOBIN: 11.8 g/dL — AB (ref 12.0–15.0)
Lymphocytes Relative: 31 % (ref 12–46)
Lymphs Abs: 1.9 10*3/uL (ref 0.7–4.0)
MCH: 30.4 pg (ref 26.0–34.0)
MCHC: 34.1 g/dL (ref 30.0–36.0)
MCV: 89.2 fL (ref 78.0–100.0)
Monocytes Absolute: 0.4 10*3/uL (ref 0.1–1.0)
Monocytes Relative: 6 % (ref 3–12)
NEUTROS PCT: 61 % (ref 43–77)
Neutro Abs: 3.6 10*3/uL (ref 1.7–7.7)
PLATELETS: 237 10*3/uL (ref 150–400)
RBC: 3.88 MIL/uL (ref 3.87–5.11)
RDW: 12.9 % (ref 11.5–15.5)
WBC: 5.9 10*3/uL (ref 4.0–10.5)

## 2014-11-18 LAB — TROPONIN I
Troponin I: <0.03 ng/mL (ref <0.031)
Troponin I: <0.03 ng/mL (ref <0.031)

## 2014-11-18 LAB — CREATININE, SERUM
Creatinine, Ser: 2.94 mg/dL — ABNORMAL HIGH (ref 0.50–1.10)
GFR, EST AFRICAN AMERICAN: 16 mL/min — AB (ref >90)
GFR, EST NON AFRICAN AMERICAN: 14 mL/min — AB (ref >90)

## 2014-11-18 MED ORDER — ONDANSETRON HCL 4 MG PO TABS: 4.0000 mg | ORAL_TABLET | Freq: Four times a day (QID) | ORAL | Status: DC | PRN

## 2014-11-18 MED ORDER — ACETAMINOPHEN 325 MG PO TABS: 650.0000 mg | ORAL_TABLET | Freq: Four times a day (QID) | ORAL | Status: DC | PRN

## 2014-11-18 MED ORDER — INSULIN DETEMIR 100 UNIT/ML FLEXPEN: 25.0000 [IU] | PEN_INJECTOR | Freq: Every day | SUBCUTANEOUS | Status: DC

## 2014-11-18 MED ORDER — AMLODIPINE BESYLATE 5 MG PO TABS
5.0000 mg | ORAL_TABLET | Freq: Every day | ORAL | Status: DC
  Administered 2014-11-19 – 2014-11-21 (×3): 5 mg via ORAL
  Filled 2014-11-18 (×3): qty 1

## 2014-11-18 MED ORDER — DONEPEZIL HCL 10 MG PO TABS
10.0000 mg | ORAL_TABLET | Freq: Every day | ORAL | Status: DC
  Administered 2014-11-18 – 2014-11-20 (×3): 10 mg via ORAL
  Filled 2014-11-18 (×4): qty 1

## 2014-11-18 MED ORDER — ENOXAPARIN SODIUM 30 MG/0.3ML ~~LOC~~ SOLN
30.0000 mg | SUBCUTANEOUS | Status: DC
  Administered 2014-11-18 – 2014-11-20 (×3): 30 mg via SUBCUTANEOUS
  Filled 2014-11-18 (×4): qty 0.3

## 2014-11-18 MED ORDER — SODIUM CHLORIDE 0.9 % IV SOLN
INTRAVENOUS | Status: DC
  Administered 2014-11-18 – 2014-11-20 (×2): via INTRAVENOUS

## 2014-11-18 MED ORDER — SODIUM CHLORIDE 0.9 % IJ SOLN
3.0000 mL | Freq: Two times a day (BID) | INTRAMUSCULAR | Status: DC
  Administered 2014-11-18 – 2014-11-19 (×2): 3 mL via INTRAVENOUS

## 2014-11-18 MED ORDER — TRAMADOL HCL 50 MG PO TABS
50.0000 mg | ORAL_TABLET | Freq: Three times a day (TID) | ORAL | Status: DC | PRN
  Administered 2014-11-18: 50 mg via ORAL
  Filled 2014-11-18: qty 1

## 2014-11-18 MED ORDER — ASPIRIN EC 81 MG PO TBEC
81.0000 mg | DELAYED_RELEASE_TABLET | Freq: Every day | ORAL | Status: DC
  Administered 2014-11-19 – 2014-11-21 (×3): 81 mg via ORAL
  Filled 2014-11-18 (×3): qty 1

## 2014-11-18 MED ORDER — ATORVASTATIN CALCIUM 20 MG PO TABS
20.0000 mg | ORAL_TABLET | Freq: Every day | ORAL | Status: DC
  Administered 2014-11-19 – 2014-11-21 (×3): 20 mg via ORAL
  Filled 2014-11-18 (×3): qty 1

## 2014-11-18 MED ORDER — INSULIN ASPART 100 UNIT/ML ~~LOC~~ SOLN
0.0000 [IU] | Freq: Three times a day (TID) | SUBCUTANEOUS | Status: DC
  Administered 2014-11-19: 7 [IU] via SUBCUTANEOUS
  Administered 2014-11-19: 2 [IU] via SUBCUTANEOUS
  Administered 2014-11-20: 3 [IU] via SUBCUTANEOUS
  Administered 2014-11-21: 5 [IU] via SUBCUTANEOUS

## 2014-11-18 MED ORDER — PAROXETINE HCL 10 MG PO TABS
10.0000 mg | ORAL_TABLET | Freq: Every day | ORAL | Status: DC
  Administered 2014-11-18 – 2014-11-20 (×3): 10 mg via ORAL
  Filled 2014-11-18 (×4): qty 1

## 2014-11-18 MED ORDER — ONDANSETRON HCL 4 MG/2ML IJ SOLN: 4.0000 mg | Freq: Four times a day (QID) | INTRAMUSCULAR | Status: DC | PRN

## 2014-11-18 MED ORDER — HYDROXYZINE HCL 10 MG PO TABS
10.0000 mg | ORAL_TABLET | Freq: Three times a day (TID) | ORAL | Status: DC | PRN
  Filled 2014-11-18: qty 1

## 2014-11-18 MED ORDER — CALCIUM CARBONATE-VITAMIN D 500-200 MG-UNIT PO TABS
1.0000 | ORAL_TABLET | Freq: Two times a day (BID) | ORAL | Status: DC
  Administered 2014-11-18 – 2014-11-21 (×6): 1 via ORAL
  Filled 2014-11-18 (×8): qty 1

## 2014-11-18 MED ORDER — INSULIN DETEMIR 100 UNIT/ML ~~LOC~~ SOLN
25.0000 [IU] | Freq: Every day | SUBCUTANEOUS | Status: DC
  Administered 2014-11-18 – 2014-11-20 (×3): 25 [IU] via SUBCUTANEOUS
  Filled 2014-11-18 (×4): qty 0.25

## 2014-11-18 MED ORDER — ALUM & MAG HYDROXIDE-SIMETH 200-200-20 MG/5ML PO SUSP: 30.0000 mL | Freq: Four times a day (QID) | ORAL | Status: DC | PRN

## 2014-11-18 MED ORDER — PANTOPRAZOLE SODIUM 40 MG PO TBEC
40.0000 mg | DELAYED_RELEASE_TABLET | Freq: Every day | ORAL | Status: DC
  Administered 2014-11-19 – 2014-11-21 (×3): 40 mg via ORAL
  Filled 2014-11-18 (×3): qty 1

## 2014-11-18 MED ORDER — ACETAMINOPHEN 650 MG RE SUPP: 650.0000 mg | Freq: Four times a day (QID) | RECTAL | Status: DC | PRN

## 2014-11-18 MED ORDER — INSULIN ASPART 100 UNIT/ML ~~LOC~~ SOLN
0.0000 [IU] | Freq: Every day | SUBCUTANEOUS | Status: DC
  Administered 2014-11-19: 2 [IU] via SUBCUTANEOUS

## 2014-11-18 MED ORDER — HYDROMORPHONE HCL 1 MG/ML IJ SOLN: 1.0000 mg | INTRAMUSCULAR | Status: DC | PRN

## 2014-11-18 MED ORDER — ISOSORBIDE MONONITRATE ER 60 MG PO TB24
60.0000 mg | ORAL_TABLET | Freq: Every day | ORAL | Status: DC
  Administered 2014-11-19 – 2014-11-21 (×3): 60 mg via ORAL
  Filled 2014-11-18 (×3): qty 1

## 2014-11-18 MED ORDER — CLOPIDOGREL BISULFATE 75 MG PO TABS
75.0000 mg | ORAL_TABLET | Freq: Every day | ORAL | Status: DC
  Administered 2014-11-19 – 2014-11-21 (×3): 75 mg via ORAL
  Filled 2014-11-18 (×3): qty 1

## 2014-11-18 MED ORDER — PREGABALIN 75 MG PO CAPS
75.0000 mg | ORAL_CAPSULE | Freq: Two times a day (BID) | ORAL | Status: DC
  Administered 2014-11-18 – 2014-11-21 (×6): 75 mg via ORAL
  Filled 2014-11-18 (×6): qty 1

## 2014-11-18 MED ORDER — FERROUS SULFATE 325 (65 FE) MG PO TABS
325.0000 mg | ORAL_TABLET | Freq: Every day | ORAL | Status: DC
  Administered 2014-11-19 – 2014-11-21 (×3): 325 mg via ORAL
  Filled 2014-11-18 (×5): qty 1

## 2014-11-18 NOTE — ED Notes (Signed)
Spoke with Phlebotomy  about labs.

## 2014-11-18 NOTE — H&P (Signed)
History and Physical        Hospital Admission Note Date: 11/18/2014  Patient name: [name] record number: [id number] Date of birth: July 15, 1929 Age: 81 y.o. Gender: female  PCP: [name], MD  Referring physician: Dr Thurnell Garbe  Chief Complaint:  Dizziness with syncopal episode today  HPI: Patient is a 81 year old female with CAD, anemia, depression, hypertension, hyperlipidemia, diabetes mellitus, insulin-dependent presented with EMS for a brief syncopal episode. Patient apparently was exercising and working out with weights at a senior center when she felt dizzy, lightheaded, weak and nausea today. Subsequently she was noted by the staff to have passed out. Patient denied any chest pain, diaphoresis, shortness of breath prior to the episode. She felt that everything was blurry before she passed out. There was no reported seizure-like activity, no incontinence of bowel and bladder or tongue biting. Per patient she did not have breakfast today. Patient also reports that she was having difficulty urinating this morning .Otherwise no dysuria or hematuria    Review of Systems:  Constitutional: Denies fever, chills, diaphoresis, poor appetite and fatigue.  HEENT: Denies photophobia, eye pain, redness, hearing loss, ear pain, congestion, sore throat, rhinorrhea, sneezing, mouth sores, trouble swallowing, neck pain, neck stiffness and tinnitus.   Respiratory: Denies SOB, DOE, cough, chest tightness,  and wheezing.   Cardiovascular: Denies chest pain, palpitations and leg swelling.  Gastrointestinal: Denies nausea, vomiting, abdominal pain, diarrhea, constipation, blood in stool and abdominal distention.  Genitourinary: Denies dysuria, urgency, frequency, hematuria, flank pain, + difficulty urinating.  Musculoskeletal: Denies myalgias, back pain, joint swelling,  arthralgias and gait problem.  Skin: Denies pallor, rash and wound.  Neurological:  please see history of present illness  Hematological: Denies adenopathy. Easy bruising, personal or family bleeding history  Psychiatric/Behavioral: Denies suicidal ideation, mood changes, confusion, nervousness, sleep disturbance and agitation  Past Medical History: Past Medical History  Diagnosis Date  . 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 ANEMIA   . DEPRESSION   . HYPERTENSION   . PVD   . INSOMNIA, CHRONIC   . Dementia   . DIABETES MELLITUS, TYPE II     renal, neuro - peripheral neuropathy  . Hypercholesteremia   . Chronic kidney disease (CKD), stage IV (severe)   . Anemia of chronic disease   . Angina   . Non-Q wave myocardial infarction 10/2009    /E-chart  . Inferior MI 2000    "quiet"  . Breast cancer 2000    right  . Loose stools   . Neurodermatitis     Past Surgical History  Procedure Laterality Date  . Capsulectomy  12/27/2010    L eye  . Breast lumpectomy  2000    right  . Cataract extraction w/ intraocular lens  implant, bilateral    . Coronary artery bypass graft  2000    CABG X1  . Coronary angioplasty with stent placement      "twice"  . Eye surgery      Cataracts (R) eye 06/1999 & (L) eye 09/1999- [place]  . Combined hysteroscopy diagnostic / d&c  07/2004    /e-chart  . Dilation and curettage of  uterus    . Cardiac surgery    . Left heart catheterization with coronary angiogram N/A 11/22/2011    Procedure: LEFT HEART CATHETERIZATION WITH CORONARY ANGIOGRAM;  Surgeon: Clent Demark, MD;  Location: [facility] CATH LAB;  Service: Cardiovascular;  Laterality: N/A;  . Left heart catheterization with coronary angiogram N/A 10/03/2012    Procedure: LEFT HEART CATHETERIZATION WITH CORONARY ANGIOGRAM;  Surgeon: Clent Demark, MD;  Location: [facility] CATH LAB;  Service: Cardiovascular;  Laterality: N/A;    Medications: Prior to Admission medications   Medication Sig Start Date  End Date Taking? Authorizing Provider  amLODipine (NORVASC) 5 MG tablet Take 1 tablet (5 mg total) by mouth daily. 06/18/14  Yes Rowe Clack, MD  aspirin 81 MG tablet Take 1 tablet (81 mg total) by mouth daily. 11/23/11  Yes Charolette Forward, MD  atorvastatin (LIPITOR) 20 MG tablet TAKE 1 TABLET BY MOUTH EVERY DAY 05/23/14  Yes Rowe Clack, MD  Calcium Carbonate-Vitamin D 600-400 MG-UNIT per tablet Take 1 tablet by mouth 2 (two) times daily. 05/18/12  Yes Rowe Clack, MD  clopidogrel (PLAVIX) 75 MG tablet TAKE 1 TABLET BY MOUTH DAILY 05/23/14  Yes Rowe Clack, MD  donepezil (ARICEPT) 10 MG tablet Take 1 tablet (10 mg total) by mouth at bedtime. 09/13/14  Yes Rowe Clack, MD  ferrous sulfate 325 (65 FE) MG tablet Take 325 mg by mouth daily with breakfast. 05/18/12  Yes Rowe Clack, MD  furosemide (LASIX) 40 MG tablet TAKE ONE TABLET BY MOUTH TWICE DAILY 07/15/14  Yes Rowe Clack, MD  HUMULIN R 100 UNIT/ML injection INJECT 8 UNITS SUBCUTANEOUSLY THREE TIMES A DAY (BEFORE MEALS) 09/13/14  Yes Philemon Kingdom, MD  HYDROcodone-acetaminophen (NORCO/VICODIN) 5-325 MG per tablet Take 1 tablet by mouth every 4 (four) hours as needed for moderate pain. 09/19/14  Yes Jola Schmidt, MD  hydrOXYzine (ATARAX/VISTARIL) 10 MG tablet Take 1 tablet (10 mg total) by mouth 3 (three) times daily as needed for itching. 04/17/13  Yes Rowe Clack, MD  Insulin Detemir (LEVEMIR FLEXTOUCH) 100 UNIT/ML Pen INJECT 26 UNITS SUBCUTANEOUSLY EVERY NIGHT AT BEDTIME 09/01/14  Yes Philemon Kingdom, MD  Insulin Pen Needle (EASY TOUCH PEN NEEDLES) 31G X 8 MM MISC Inject 1 pen into the skin daily. 10/27/14  Yes Rowe Clack, MD  isosorbide mononitrate (IMDUR) 60 MG 24 hr tablet Take 60 mg by mouth daily.   Yes Historical Provider, MD  losartan (COZAAR) 50 MG tablet Take 1 tablet (50 mg total) by mouth daily. 10/04/12  Yes Charolette Forward, MD  pantoprazole (PROTONIX) 40 MG tablet Take 1 tablet  (40 mg total) by mouth daily at 6 (six) AM. 10/04/12  Yes Charolette Forward, MD  PARoxetine (PAXIL) 10 MG tablet Take 1 tablet (10 mg total) by mouth at bedtime. 09/13/14  Yes Rowe Clack, MD  pregabalin (LYRICA) 75 MG capsule Take 1 capsule (75 mg total) by mouth 2 (two) times daily. 06/25/14  Yes Rowe Clack, MD  traMADol (ULTRAM) 50 MG tablet Take 1 tablet (50 mg total) by mouth every 8 (eight) hours as needed. 12/05/13  Yes Hendricks Limes, MD  triamcinolone cream (KENALOG) 0.1 % Apply topically 2 (two) times daily. 08/13/14  Yes Rowe Clack, MD  levofloxacin (LEVAQUIN) 500 MG tablet Take 1 tablet (500 mg total) by mouth daily. 09/19/14   Jola Schmidt, MD  nitroGLYCERIN (NITROSTAT) 0.4 MG SL tablet Place 1 tablet (0.4 mg total) under the tongue  every 5 (five) minutes x 3 doses as needed for chest pain. 11/23/11 11/07/13  Charolette Forward, MD    Allergies:   Allergies  Allergen Reactions  . Ace Inhibitors Cough    Social History: Patient reports that she has quit smoking. Her smoking use included Cigarettes. She has a 20 pack-year smoking history. She has never used smokeless tobacco. She reports that she drinks alcohol. She reports that she does not use illicit drugs.  Family History:Family history reviewed with the patient  Family History  Problem Relation Age of Onset  . Coronary artery disease Father     Physical Exam: Blood pressure 138/84, pulse 61, temperature 97.7 F (36.5 C), temperature source Oral, resp. rate 16, weight 77.111 kg (170 lb), SpO2 100 %. General: Alert, awake, oriented x3, in no acute distress. HEENT: normocephalic, atraumatic, anicteric sclera, pink conjunctiva, pupils equal and reactive to light and accomodation, oropharynx clear Neck: supple, no masses or lymphadenopathy, no goiter, no bruits  Heart: Regular rate and rhythm, without murmurs, rubs or gallops. Lungs: Clear to auscultation bilaterally, no wheezing, rales or rhonchi. Abdomen: Soft,  nontender, nondistended, positive bowel sounds, no masses. Extremities: No clubbing, cyanosis or edema with positive pedal pulses. Neuro: Grossly intact, no focal neurological deficits, strength 5/5 upper and lower extremities bilaterally Psych: alert and oriented x 3, normal mood and affect Skin: no rashes or lesions, warm and dry   LABS on Admission:  Basic Metabolic Panel:  Recent Labs Lab 11/18/14 1400  NA 133*  K 4.4  CL 100  CO2 20  GLUCOSE 277*  BUN 55*  CREATININE 2.89*  CALCIUM 8.6   Liver Function Tests: No results for input(s): AST, ALT, ALKPHOS, BILITOT, PROT, ALBUMIN in the last 168 hours. No results for input(s): LIPASE, AMYLASE in the last 168 hours. No results for input(s): AMMONIA in the last 168 hours. CBC:  Recent Labs Lab 11/18/14 1400  WBC 5.9  NEUTROABS 3.6  HGB 11.8*  HCT 34.6*  MCV 89.2  PLT 237   Cardiac Enzymes:  Recent Labs Lab 11/18/14 1400  TROPONINI <0.03   BNP: Invalid input(s): POCBNP CBG: No results for input(s): GLUCAP in the last 168 hours.  Radiological Exams on Admission:  Dg Chest 2 View  11/18/2014   CLINICAL DATA:  81 year old female with weakness. History of hypertension, coronary artery disease and breast cancer. Initial encounter.  EXAM: CHEST  2 VIEW  COMPARISON:  09/19/2014 and 10/01/2012.  FINDINGS: Cardiomegaly.  Central pulmonary vascular prominence.  Chronic lung changes similar to 2014. No segmental consolidation or plain film evidence pulmonary malignancy.  Calcified aorta.  IMPRESSION: Cardiomegaly.  Central pulmonary vascular prominence.  Chronic lung changes similar to 2014. No segmental consolidation or plain film evidence pulmonary malignancy.   Electronically Signed   By: Genia Del M.D.   On: 11/18/2014 13:58   Ct Head Wo Contrast  11/18/2014   CLINICAL DATA:  81 year old hypertensive female fell weak this morning. Initial encounter.  EXAM: CT HEAD WITHOUT CONTRAST  TECHNIQUE: Contiguous axial images  were obtained from the base of the skull through the vertex without intravenous contrast.  COMPARISON:  04/01/2012.  FINDINGS: No intracranial hemorrhage or CT evidence of large acute infarct.  Small vessel disease type changes.  Global atrophy without hydrocephalus.  No intracranial mass lesion noted on this unenhanced exam.  Vascular calcifications.  Orbital structures unremarkable.  Mastoid air cells, middle ear cavities and visualized paranasal sinuses are clear.  IMPRESSION: IMPRESSION  No intracranial hemorrhage or CT  evidence of large acute infarct.  Small vessel disease type changes.   Electronically Signed   By: Genia Del M.D.   On: 11/18/2014 13:17    *I have personally reviewed the images above*  EKG: Independently reviewed. EKG rate 65 normal sinus rhythm, no acute ST-T wave changes suggestive of ischemia   Assessment/Plan Principal Problem:   SYNCOPE: Likely vasovagal Orthostatic or dehydration  - Patient had not had anything to eat this morning and was working out weights, creatinine 2.89 today, baseline 2.1 - 2.3 - Admit to telemetry, observation, serial cardiac enzymes, obtain 2-D echocardiogram to assess EF or any regional wall motion abnormalities - Hold furosemide, losartan, place on gentle hydration - Obtain UA and culture to rule out UTI  Active Problems:   Uncontrolled type 2 diabetes with renal manifestation - Obtain hemoglobin A1c, place on Levemir and sliding scale insulin    Essential hypertension - Continue Imdur, amlodipine    Coronary atherosclerosis: No chest pain or any cardiac symptoms - Continue aspirin, Plavix, Imdur, amlodipine, statin    Dementia - Continue Aricept    Acute on CKD (chronic kidney disease) stage 4, GFR 15-29 ml/min - Hold losartan, lasix   DVT prophylaxis: Lovenox   CODE STATUS: full code  Family Communication: Admission, patients condition and plan of care including tests being ordered have been discussed with the patient  and daughter who indicates understanding and agree with the plan and Code Status  Disposition plan: Further plan will depend as patient's clinical course evolves and further radiologic and laboratory data become available. Likely home once stable  Time Spent on Admission: 60 mins   Marlyne Totaro M.D. [facility] 11/18/2014, 4:00 PM Pager: [phone]  If 7PM-7AM, please contact night-coverage [email or web address] Password [email or web address]

## 2014-11-18 NOTE — ED Notes (Signed)
[name] to department via EMS- [name] was at the senior center working out and started feeling nauseated and weak. [name] was lethargic on EMS arrival. [name] a&O on arrival. Bp-138/72 Hr-68 Cbg-392 No LOC.

## 2014-11-18 NOTE — ED Notes (Signed)
Report attempted.  Nurse to call back.

## 2014-11-18 NOTE — ED Notes (Signed)
This nurse attempted LaBs unsuccessful Phlebotomy called.

## 2014-11-18 NOTE — ED Notes (Signed)
Spoke with Lab states Labs are being ran now.

## 2014-11-18 NOTE — ED Notes (Signed)
Patient sent off unit for scan by another nurse.

## 2014-11-18 NOTE — ED Notes (Signed)
Pt unable to stand for orthostatics  

## 2014-11-18 NOTE — ED Provider Notes (Signed)
CSN: [id number]     Arrival date & time 11/18/14  1034 History   First MD Initiated Contact with Patient 11/18/14 1058     Chief Complaint  Patient presents with  . Loss of Consciousness  . Fatigue      Patient is a 81 y.o. female presenting with syncope. The history is provided by the patient, a relative and the EMS personnel.  Loss of Consciousness Pt was seen at 1135. Per EMS, pt's family and pt report: Pt s/p brief syncopal episode PTA. Pt was sitting down and working out with weights at the senior center when she felt "weak" and "nauseated." Pt was then noted by staff to have "passed out." EMS noted pt was "lethargic" on their arrival to scene. No reported seizure activity, no incont of bowel/bladder, no fall, no CP/palpitations, no SOB/cough, no abd pain, no N/V/D. Pt herself has significant hx of dementia and currently denies any complaints.   Past Medical History  Diagnosis Date  . 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 ANEMIA   . DEPRESSION   . HYPERTENSION   . PVD   . INSOMNIA, CHRONIC   . Dementia   . DIABETES MELLITUS, TYPE II     renal, neuro - peripheral neuropathy  . Hypercholesteremia   . Chronic kidney disease (CKD), stage IV (severe)   . Anemia of chronic disease   . Angina   . Non-Q wave myocardial infarction 10/2009    /E-chart  . Inferior MI 2000    "quiet"  . Breast cancer 2000    right  . Loose stools   . Neurodermatitis    Past Surgical History  Procedure Laterality Date  . Capsulectomy  12/27/2010    L eye  . Breast lumpectomy  2000    right  . Cataract extraction w/ intraocular lens  implant, bilateral    . Coronary artery bypass graft  2000    CABG X1  . Coronary angioplasty with stent placement      "twice"  . Eye surgery      Cataracts (R) eye 06/1999 & (L) eye 09/1999- [place]  . Combined hysteroscopy diagnostic / d&c  07/2004    /e-chart  . Dilation and curettage of uterus    . Cardiac surgery    . Left heart catheterization  with coronary angiogram N/A 11/22/2011    Procedure: LEFT HEART CATHETERIZATION WITH CORONARY ANGIOGRAM;  Surgeon: Clent Demark, MD;  Location: [facility] CATH LAB;  Service: Cardiovascular;  Laterality: N/A;  . Left heart catheterization with coronary angiogram N/A 10/03/2012    Procedure: LEFT HEART CATHETERIZATION WITH CORONARY ANGIOGRAM;  Surgeon: Clent Demark, MD;  Location: [facility] CATH LAB;  Service: Cardiovascular;  Laterality: N/A;   Family History  Problem Relation Age of Onset  . Coronary artery disease Father    History  Substance Use Topics  . Smoking status: Former Smoker -- 1.00 packs/day for 20 years    Types: Cigarettes  . Smokeless tobacco: Never Used     Comment: 11/21/11 "quit smoking 30-40 years ago  . Alcohol Use: Yes     Comment: 11/21/11 "used to drink quit alot; last drink of alcohol maybe 30 years ago"    Review of Systems  Unable to perform ROS: Dementia  Cardiovascular: Positive for syncope.      Allergies  Ace inhibitors  Home Medications   Prior to Admission medications   Medication Sig Start Date End Date Taking? Authorizing Provider  amLODipine (NORVASC) 5 MG tablet Take 1 tablet (5 mg total) by mouth daily. 06/18/14   Rowe Clack, MD  aspirin 81 MG tablet Take 1 tablet (81 mg total) by mouth daily. 11/23/11   Charolette Forward, MD  atorvastatin (LIPITOR) 20 MG tablet TAKE 1 TABLET BY MOUTH EVERY DAY 05/23/14   Rowe Clack, MD  Calcium Carbonate-Vitamin D 600-400 MG-UNIT per tablet Take 1 tablet by mouth 2 (two) times daily. 05/18/12   Rowe Clack, MD  clopidogrel (PLAVIX) 75 MG tablet TAKE 1 TABLET BY MOUTH DAILY 05/23/14   Rowe Clack, MD  donepezil (ARICEPT) 10 MG tablet Take 1 tablet (10 mg total) by mouth at bedtime. 09/13/14   Rowe Clack, MD  ferrous sulfate 325 (65 FE) MG tablet Take 325 mg by mouth daily with breakfast. 05/18/12   Rowe Clack, MD  furosemide (LASIX) 40 MG tablet TAKE ONE TABLET BY MOUTH TWICE  DAILY 07/15/14   Rowe Clack, MD  HUMULIN R 100 UNIT/ML injection INJECT 8 UNITS SUBCUTANEOUSLY THREE TIMES A DAY (BEFORE MEALS) 09/13/14   Philemon Kingdom, MD  HYDROcodone-acetaminophen (NORCO/VICODIN) 5-325 MG per tablet Take 1 tablet by mouth every 4 (four) hours as needed for moderate pain. 09/19/14   Jola Schmidt, MD  hydrOXYzine (ATARAX/VISTARIL) 10 MG tablet Take 1 tablet (10 mg total) by mouth 3 (three) times daily as needed for itching. 04/17/13   Rowe Clack, MD  Insulin Detemir (LEVEMIR FLEXTOUCH) 100 UNIT/ML Pen INJECT 26 UNITS SUBCUTANEOUSLY EVERY NIGHT AT BEDTIME 09/01/14   Philemon Kingdom, MD  Insulin Pen Needle (EASY TOUCH PEN NEEDLES) 31G X 8 MM MISC Inject 1 pen into the skin daily. 10/27/14   Rowe Clack, MD  isosorbide mononitrate (IMDUR) 60 MG 24 hr tablet Take 60 mg by mouth daily.    Historical Provider, MD  levofloxacin (LEVAQUIN) 500 MG tablet Take 1 tablet (500 mg total) by mouth daily. 09/19/14   Jola Schmidt, MD  losartan (COZAAR) 50 MG tablet Take 1 tablet (50 mg total) by mouth daily. 10/04/12   Charolette Forward, MD  nitroGLYCERIN (NITROSTAT) 0.4 MG SL tablet Place 1 tablet (0.4 mg total) under the tongue every 5 (five) minutes x 3 doses as needed for chest pain. 11/23/11 11/07/13  Charolette Forward, MD  pantoprazole (PROTONIX) 40 MG tablet Take 1 tablet (40 mg total) by mouth daily at 6 (six) AM. 10/04/12   Charolette Forward, MD  PARoxetine (PAXIL) 10 MG tablet Take 1 tablet (10 mg total) by mouth at bedtime. 09/13/14   Rowe Clack, MD  pregabalin (LYRICA) 75 MG capsule Take 1 capsule (75 mg total) by mouth 2 (two) times daily. 06/25/14   Rowe Clack, MD  traMADol (ULTRAM) 50 MG tablet Take 1 tablet (50 mg total) by mouth every 8 (eight) hours as needed. 12/05/13   Hendricks Limes, MD  triamcinolone cream (KENALOG) 0.1 % Apply topically 2 (two) times daily. 08/13/14   Rowe Clack, MD   BP 168/81 mmHg  Pulse 63  Temp(Src) 97.7 F (36.5 C) (Oral)   Resp 16  Wt 170 lb (77.111 kg)  SpO2 99%    Orthostatic Vital Signs Orthostatic Lying  - BP- Lying: 170/70 mmHg ; Pulse- Lying: 64  Orthostatic Sitting - BP- Sitting: 129/60 mmHg ; Pulse- Sitting: 66      Physical Exam  1140; Physical examination:  Nursing notes reviewed; Vital signs and O2 SAT reviewed;  Constitutional: Well developed, Well nourished, In  no acute distress; Head:  Normocephalic, atraumatic; Eyes: EOMI, PERRL, No scleral icterus; ENMT: Mouth and pharynx normal, Mucous membranes dry; Neck: Supple, Full range of motion, No lymphadenopathy; Cardiovascular: Regular rate and rhythm, No gallop; Respiratory: Breath sounds clear & equal bilaterally, No wheezes.  Speaking full sentences with ease, Normal respiratory effort/excursion; Chest: Nontender, Movement normal; Abdomen: Soft, Nontender, Nondistended, Normal bowel sounds; Genitourinary: No CVA tenderness; Extremities: Pulses normal, No tenderness, No edema, No calf edema or asymmetry.; Neuro: Awake, alert, mildly confused re: events per baseline. Major CN grossly intact. No facial droop. Speech clear. Grips equal. Strength 5/5 equal bilat UE's and LE's. Moves all extremities spontaneously and to command without apparent gross focal motor deficits.; Skin: Color normal, Warm, Dry.   ED Course  Procedures     EKG Interpretation   Date/Time:  Tuesday November 18 2014 10:40:42 EDT Ventricular Rate:  65 PR Interval:  164 QRS Duration: 93 QT Interval:  449 QTC Calculation: 467 R Axis:   -18 Text Interpretation:  Sinus rhythm Left axis deviation When compared with  ECG of 10/03/2012 No significant change was found Confirmed by [facility]   MD, Nunzio Cory [phone]) on 11/18/2014 11:00:16 AM      MDM  MDM Reviewed: previous chart, nursing note and vitals Reviewed previous: labs and ECG Interpretation: labs, ECG, x-ray and CT scan      Results for orders placed or performed during the hospital encounter of 43/15/40  Basic  metabolic panel  Result Value Ref Range   Sodium 133 (L) 135 - 145 mmol/L   Potassium 4.4 3.5 - 5.1 mmol/L   Chloride 100 96 - 112 mmol/L   CO2 20 19 - 32 mmol/L   Glucose, Bld 277 (H) 70 - 99 mg/dL   BUN 55 (H) 6 - 23 mg/dL   Creatinine, Ser 2.89 (H) 0.50 - 1.10 mg/dL   Calcium 8.6 8.4 - 10.5 mg/dL   GFR calc non Af Amer 14 (L) >90 mL/min   GFR calc Af Amer 16 (L) >90 mL/min   Anion gap 13 5 - 15  Troponin I  Result Value Ref Range   Troponin I <0.03 <0.031 ng/mL  CBC with Differential  Result Value Ref Range   WBC 5.9 4.0 - 10.5 K/uL   RBC 3.88 3.87 - 5.11 MIL/uL   Hemoglobin 11.8 (L) 12.0 - 15.0 g/dL   HCT 34.6 (L) 36.0 - 46.0 %   MCV 89.2 78.0 - 100.0 fL   MCH 30.4 26.0 - 34.0 pg   MCHC 34.1 30.0 - 36.0 g/dL   RDW 12.9 11.5 - 15.5 %   Platelets 237 150 - 400 K/uL   Neutrophils Relative % 61 43 - 77 %   Neutro Abs 3.6 1.7 - 7.7 K/uL   Lymphocytes Relative 31 12 - 46 %   Lymphs Abs 1.9 0.7 - 4.0 K/uL   Monocytes Relative 6 3 - 12 %   Monocytes Absolute 0.4 0.1 - 1.0 K/uL   Eosinophils Relative 2 0 - 5 %   Eosinophils Absolute 0.1 0.0 - 0.7 K/uL   Basophils Relative 0 0 - 1 %   Basophils Absolute 0.0 0.0 - 0.1 K/uL  Urinalysis, Routine w reflex microscopic  Result Value Ref Range   Color, Urine YELLOW YELLOW   APPearance CLEAR CLEAR   Specific Gravity, Urine 1.013 1.005 - 1.030   pH 5.0 5.0 - 8.0   Glucose, UA 250 (A) NEGATIVE mg/dL   Hgb urine dipstick NEGATIVE NEGATIVE  Bilirubin Urine NEGATIVE NEGATIVE   Ketones, ur NEGATIVE NEGATIVE mg/dL   Protein, ur NEGATIVE NEGATIVE mg/dL   Urobilinogen, UA 0.2 0.0 - 1.0 mg/dL   Nitrite NEGATIVE NEGATIVE   Leukocytes, UA NEGATIVE NEGATIVE   Dg Chest 2 View 11/18/2014   CLINICAL DATA:  81 year old female with weakness. History of hypertension, coronary artery disease and breast cancer. Initial encounter.  EXAM: CHEST  2 VIEW  COMPARISON:  09/19/2014 and 10/01/2012.  FINDINGS: Cardiomegaly.  Central pulmonary vascular  prominence.  Chronic lung changes similar to 2014. No segmental consolidation or plain film evidence pulmonary malignancy.  Calcified aorta.  IMPRESSION: Cardiomegaly.  Central pulmonary vascular prominence.  Chronic lung changes similar to 2014. No segmental consolidation or plain film evidence pulmonary malignancy.   Electronically Signed   By: Genia Del M.D.   On: 11/18/2014 13:58   Ct Head Wo Contrast 11/18/2014   CLINICAL DATA:  81 year old hypertensive female fell weak this morning. Initial encounter.  EXAM: CT HEAD WITHOUT CONTRAST  TECHNIQUE: Contiguous axial images were obtained from the base of the skull through the vertex without intravenous contrast.  COMPARISON:  04/01/2012.  FINDINGS: No intracranial hemorrhage or CT evidence of large acute infarct.  Small vessel disease type changes.  Global atrophy without hydrocephalus.  No intracranial mass lesion noted on this unenhanced exam.  Vascular calcifications.  Orbital structures unremarkable.  Mastoid air cells, middle ear cavities and visualized paranasal sinuses are clear.  IMPRESSION: IMPRESSION  No intracranial hemorrhage or CT evidence of large acute infarct.  Small vessel disease type changes.   Electronically Signed   By: Genia Del M.D.   On: 11/18/2014 13:17    1530:  Pt unable to stand for orthostatic VS. VS otherwise remain stable. H/H per baseline. BUN/Cr mildly elevated from baseline. Dx and testing d/w pt and family.  Questions answered.  Verb understanding, agreeable to admit.  T/C to [facility]. Tana Coast, case discussed, including:  HPI, pertinent PM/SHx, VS/PE, dx testing, ED course and treatment:  Agreeable to admit, requests to write temporary orders, obtain observation tele bed to team [facility]Admits.   Francine Graven, DO 11/21/14 2118

## 2014-11-19 DIAGNOSIS — E1122 Type 2 diabetes mellitus with diabetic chronic kidney disease: Secondary | ICD-10-CM | POA: Diagnosis present

## 2014-11-19 DIAGNOSIS — N184 Chronic kidney disease, stage 4 (severe): Secondary | ICD-10-CM | POA: Diagnosis not present

## 2014-11-19 DIAGNOSIS — D638 Anemia in other chronic diseases classified elsewhere: Secondary | ICD-10-CM | POA: Diagnosis present

## 2014-11-19 DIAGNOSIS — N390 Urinary tract infection, site not specified: Secondary | ICD-10-CM | POA: Diagnosis present

## 2014-11-19 DIAGNOSIS — I129 Hypertensive chronic kidney disease with stage 1 through stage 4 chronic kidney disease, or unspecified chronic kidney disease: Secondary | ICD-10-CM | POA: Diagnosis present

## 2014-11-19 DIAGNOSIS — Z961 Presence of intraocular lens: Secondary | ICD-10-CM | POA: Diagnosis present

## 2014-11-19 DIAGNOSIS — N289 Disorder of kidney and ureter, unspecified: Secondary | ICD-10-CM | POA: Diagnosis present

## 2014-11-19 DIAGNOSIS — E1129 Type 2 diabetes mellitus with other diabetic kidney complication: Secondary | ICD-10-CM | POA: Diagnosis not present

## 2014-11-19 DIAGNOSIS — G4700 Insomnia, unspecified: Secondary | ICD-10-CM | POA: Diagnosis present

## 2014-11-19 DIAGNOSIS — Z9841 Cataract extraction status, right eye: Secondary | ICD-10-CM | POA: Diagnosis not present

## 2014-11-19 DIAGNOSIS — I2510 Atherosclerotic heart disease of native coronary artery without angina pectoris: Secondary | ICD-10-CM | POA: Diagnosis present

## 2014-11-19 DIAGNOSIS — Z794 Long term (current) use of insulin: Secondary | ICD-10-CM | POA: Diagnosis not present

## 2014-11-19 DIAGNOSIS — I739 Peripheral vascular disease, unspecified: Secondary | ICD-10-CM | POA: Diagnosis present

## 2014-11-19 DIAGNOSIS — I252 Old myocardial infarction: Secondary | ICD-10-CM | POA: Diagnosis not present

## 2014-11-19 DIAGNOSIS — Z853 Personal history of malignant neoplasm of breast: Secondary | ICD-10-CM | POA: Diagnosis not present

## 2014-11-19 DIAGNOSIS — Z87891 Personal history of nicotine dependence: Secondary | ICD-10-CM | POA: Diagnosis not present

## 2014-11-19 DIAGNOSIS — I10 Essential (primary) hypertension: Secondary | ICD-10-CM | POA: Diagnosis not present

## 2014-11-19 DIAGNOSIS — Z951 Presence of aortocoronary bypass graft: Secondary | ICD-10-CM | POA: Diagnosis not present

## 2014-11-19 DIAGNOSIS — E1165 Type 2 diabetes mellitus with hyperglycemia: Secondary | ICD-10-CM | POA: Diagnosis present

## 2014-11-19 DIAGNOSIS — Z955 Presence of coronary angioplasty implant and graft: Secondary | ICD-10-CM | POA: Diagnosis not present

## 2014-11-19 DIAGNOSIS — R55 Syncope and collapse: Secondary | ICD-10-CM | POA: Diagnosis not present

## 2014-11-19 DIAGNOSIS — Z7901 Long term (current) use of anticoagulants: Secondary | ICD-10-CM | POA: Diagnosis not present

## 2014-11-19 DIAGNOSIS — E1140 Type 2 diabetes mellitus with diabetic neuropathy, unspecified: Secondary | ICD-10-CM | POA: Diagnosis present

## 2014-11-19 DIAGNOSIS — Z7982 Long term (current) use of aspirin: Secondary | ICD-10-CM | POA: Diagnosis not present

## 2014-11-19 DIAGNOSIS — E785 Hyperlipidemia, unspecified: Secondary | ICD-10-CM | POA: Diagnosis present

## 2014-11-19 DIAGNOSIS — F0390 Unspecified dementia without behavioral disturbance: Secondary | ICD-10-CM | POA: Diagnosis not present

## 2014-11-19 DIAGNOSIS — Z9842 Cataract extraction status, left eye: Secondary | ICD-10-CM | POA: Diagnosis not present

## 2014-11-19 DIAGNOSIS — G629 Polyneuropathy, unspecified: Secondary | ICD-10-CM | POA: Diagnosis present

## 2014-11-19 DIAGNOSIS — E860 Dehydration: Secondary | ICD-10-CM | POA: Diagnosis present

## 2014-11-19 DIAGNOSIS — F329 Major depressive disorder, single episode, unspecified: Secondary | ICD-10-CM | POA: Diagnosis present

## 2014-11-19 DIAGNOSIS — E780 Pure hypercholesterolemia: Secondary | ICD-10-CM | POA: Diagnosis present

## 2014-11-19 LAB — CBC
HCT: 34.5 % — ABNORMAL LOW (ref 36.0–46.0)
Hemoglobin: 11.5 g/dL — ABNORMAL LOW (ref 12.0–15.0)
MCH: 29.9 pg (ref 26.0–34.0)
MCHC: 33.3 g/dL (ref 30.0–36.0)
MCV: 89.6 fL (ref 78.0–100.0)
PLATELETS: 252 10*3/uL (ref 150–400)
RBC: 3.85 MIL/uL — ABNORMAL LOW (ref 3.87–5.11)
RDW: 13 % (ref 11.5–15.5)
WBC: 5.6 10*3/uL (ref 4.0–10.5)

## 2014-11-19 LAB — GLUCOSE, CAPILLARY
GLUCOSE-CAPILLARY: 325 mg/dL — AB (ref 70–99)
Glucose-Capillary: 112 mg/dL — ABNORMAL HIGH (ref 70–99)
Glucose-Capillary: 175 mg/dL — ABNORMAL HIGH (ref 70–99)
Glucose-Capillary: 222 mg/dL — ABNORMAL HIGH (ref 70–99)

## 2014-11-19 LAB — BASIC METABOLIC PANEL
ANION GAP: 10 (ref 5–15)
BUN: 54 mg/dL — ABNORMAL HIGH (ref 6–23)
CALCIUM: 8.3 mg/dL — AB (ref 8.4–10.5)
CO2: 22 mmol/L (ref 19–32)
Chloride: 102 mmol/L (ref 96–112)
Creatinine, Ser: 2.7 mg/dL — ABNORMAL HIGH (ref 0.50–1.10)
GFR calc non Af Amer: 15 mL/min — ABNORMAL LOW (ref >90)
GFR, EST AFRICAN AMERICAN: 18 mL/min — AB (ref >90)
Glucose, Bld: 124 mg/dL — ABNORMAL HIGH (ref 70–99)
Potassium: 4.4 mmol/L (ref 3.5–5.1)
Sodium: 134 mmol/L — ABNORMAL LOW (ref 135–145)

## 2014-11-19 LAB — URINE MICROSCOPIC-ADD ON

## 2014-11-19 LAB — HEMOGLOBIN A1C
Hgb A1c MFr Bld: 15.4 % — ABNORMAL HIGH (ref 4.8–5.6)
Mean Plasma Glucose: 395 mg/dL

## 2014-11-19 LAB — TROPONIN I
Troponin I: <0.03 ng/mL (ref <0.031)
Troponin I: <0.03 ng/mL (ref <0.031)

## 2014-11-19 LAB — URINALYSIS, ROUTINE W REFLEX MICROSCOPIC
Bilirubin Urine: NEGATIVE
Glucose, UA: NEGATIVE mg/dL
Ketones, ur: NEGATIVE mg/dL
NITRITE: NEGATIVE
PROTEIN: NEGATIVE mg/dL
SPECIFIC GRAVITY, URINE: 1.01 (ref 1.005–1.030)
UROBILINOGEN UA: 0.2 mg/dL (ref 0.0–1.0)
pH: 5 (ref 5.0–8.0)

## 2014-11-19 MED ORDER — CEFTRIAXONE SODIUM IN DEXTROSE 20 MG/ML IV SOLN
1.0000 g | INTRAVENOUS | Status: DC
  Administered 2014-11-19 – 2014-11-20 (×2): 1 g via INTRAVENOUS
  Filled 2014-11-19 (×2): qty 50

## 2014-11-19 NOTE — Progress Notes (Signed)
  Echocardiogram 2D Echocardiogram has been performed.  Darlina Sicilian M 11/19/2014, 3:55 PM

## 2014-11-19 NOTE — Progress Notes (Signed)
Inpatient Diabetes Program Recommendations  AACE/ADA: New Consensus Statement on Inpatient Glycemic Control (2013)  Target Ranges:  Prepandial:   less than 140 mg/dL      Peak postprandial:   less than 180 mg/dL (1-2 hours)      Critically ill patients:  140 - 180 mg/dL   Inpatient Diabetes Program Recommendations Insulin - Meal Coverage: add Novolog 4 units TID with meals per Glycemic Control order-set Thank you  Rocko Fesperman BSN, RN,CDE Inpatient Diabetes Coordinator [phone] (team pager)  

## 2014-11-19 NOTE — Progress Notes (Signed)
UR completed 

## 2014-11-19 NOTE — Progress Notes (Signed)
PROGRESS NOTE  [name] SWF:[id number] DOB: [birth year] DOA: 11/18/2014 PCP: Nichole Grant, MD  Assessment/Plan: SYNCOPE: Likely vasovagal Orthostatic or dehydration  - Patient had not had anything to eat this morning and was working out weights, creatinine 2.89: baseline 2.1 - 2.3 - telemetry,  -serial cardiac enzymes - 2-D echocardiogram to assess EF or any regional wall motion abnormalities - Hold furosemide, losartan, place on gentle hydration  UTI -rocephin -await culture   Uncontrolled type 2 diabetes with renal manifestation - hemoglobin A1c 15.4, place on Levemir and sliding scale insulin -diabetic coordinator   Essential hypertension - Continue Imdur, amlodipine   Coronary atherosclerosis: No chest pain or any cardiac symptoms - Continue aspirin, Plavix, Imdur, amlodipine, statin   Dementia - Continue Aricept   Acute on CKD (chronic kidney disease) stage 4, GFR 15-29 ml/min - Hold losartan, lasix  -baseline 2.1  Code Status: full Family Communication: patient and daughter Disposition Plan:    Consultants:    Procedures:      HPI/Subjective: Feeling well, getting up and walking to bathroom with a walker  Objective: Filed Vitals:   11/19/14 0516  BP: 145/54  Pulse:   Temp: 97.7 F (36.5 C)  Resp: 18    Intake/Output Summary (Last 24 hours) at 11/19/14 0859 Last data filed at 11/19/14 0405  Gross per 24 hour  Intake      0 ml  Output   1050 ml  Net  -1050 ml   Filed Weights   11/18/14 1037 11/18/14 1853  Weight: 77.111 kg (170 lb) 72.984 kg (160 lb 14.4 oz)    Exam:   General:  A+Ox3, NAD  Cardiovascular: rrr  Respiratory: clear  Abdomen: +BS, soft  Musculoskeletal: no edema  Data Reviewed: Basic Metabolic Panel:  Recent Labs Lab 11/18/14 1400 11/18/14 2040  NA 133*  --   K 4.4  --   CL 100  --   CO2 20  --   GLUCOSE 277*  --   BUN 55*  --   CREATININE 2.89* 2.94*  CALCIUM 8.6  --    Liver Function  Tests: No results for input(s): AST, ALT, ALKPHOS, BILITOT, PROT, ALBUMIN in the last 168 hours. No results for input(s): LIPASE, AMYLASE in the last 168 hours. No results for input(s): AMMONIA in the last 168 hours. CBC:  Recent Labs Lab 11/18/14 1400 11/18/14 2040 11/19/14 0656  WBC 5.9 5.8 5.6  NEUTROABS 3.6  --   --   HGB 11.8* 11.7* 11.5*  HCT 34.6* 35.3* 34.5*  MCV 89.2 89.1 89.6  PLT 237 260 252   Cardiac Enzymes:  Recent Labs Lab 11/18/14 1400 11/18/14 2040 11/18/14 2339  TROPONINI <0.03 <0.03 <0.03   BNP (last 3 results) No results for input(s): BNP in the last 8760 hours.  ProBNP (last 3 results) No results for input(s): PROBNP in the last 8760 hours.  CBG:  Recent Labs Lab 11/18/14 2139 11/19/14 0822  GLUCAP 148* 112*    No results found for this or any previous visit (from the past 240 hour(s)).   Studies: Dg Chest 2 View  11/18/2014   CLINICAL DATA:  81 year old female with weakness. History of hypertension, coronary artery disease and breast cancer. Initial encounter.  EXAM: CHEST  2 VIEW  COMPARISON:  09/19/2014 and 10/01/2012.  FINDINGS: Cardiomegaly.  Central pulmonary vascular prominence.  Chronic lung changes similar to 2014. No segmental consolidation or plain film evidence pulmonary malignancy.  Calcified aorta.  IMPRESSION: Cardiomegaly.  Central pulmonary vascular prominence.  Chronic lung changes similar to 2014. No segmental consolidation or plain film evidence pulmonary malignancy.   Electronically Signed   By: Genia Del M.D.   On: 11/18/2014 13:58   Ct Head Wo Contrast  11/18/2014   CLINICAL DATA:  81 year old hypertensive female fell weak this morning. Initial encounter.  EXAM: CT HEAD WITHOUT CONTRAST  TECHNIQUE: Contiguous axial images were obtained from the base of the skull through the vertex without intravenous contrast.  COMPARISON:  04/01/2012.  FINDINGS: No intracranial hemorrhage or CT evidence of large acute infarct.  Small  vessel disease type changes.  Global atrophy without hydrocephalus.  No intracranial mass lesion noted on this unenhanced exam.  Vascular calcifications.  Orbital structures unremarkable.  Mastoid air cells, middle ear cavities and visualized paranasal sinuses are clear.  IMPRESSION: IMPRESSION  No intracranial hemorrhage or CT evidence of large acute infarct.  Small vessel disease type changes.   Electronically Signed   By: Genia Del M.D.   On: 11/18/2014 13:17    Scheduled Meds: . amLODipine  5 mg Oral Daily  . aspirin EC  81 mg Oral Daily  . atorvastatin  20 mg Oral Daily  . calcium-vitamin D  1 tablet Oral BID  . clopidogrel  75 mg Oral Daily  . donepezil  10 mg Oral QHS  . enoxaparin (LOVENOX) injection  30 mg Subcutaneous Q24H  . ferrous sulfate  325 mg Oral Q breakfast  . insulin aspart  0-5 Units Subcutaneous QHS  . insulin aspart  0-9 Units Subcutaneous TID WC  . insulin detemir  25 Units Subcutaneous QHS  . isosorbide mononitrate  60 mg Oral Daily  . pantoprazole  40 mg Oral Q0600  . PARoxetine  10 mg Oral QHS  . pregabalin  75 mg Oral BID  . sodium chloride  3 mL Intravenous Q12H   Continuous Infusions: . sodium chloride 75 mL/hr at 11/18/14 2115   Antibiotics Given (last 72 hours)    None      Principal Problem:   SYNCOPE Active Problems:   Uncontrolled type 2 diabetes with renal manifestation   Essential hypertension   Coronary atherosclerosis   Dementia   CKD (chronic kidney disease) stage 4, GFR 15-29 ml/min   Syncope    Time spent: 25 min    [name], [facility] Pager [phone]. If 7PM-7AM, please contact night-coverage at [email or web address], password [facility] 11/19/2014, 8:59 AM

## 2014-11-19 NOTE — Evaluation (Signed)
Occupational Therapy Evaluation Patient Details Name: [name] MRN: [id number] DOB: [birth year] Today's Date: 11/19/2014    History of Present Illness Pt is an 81 y.o. Female with PMH of CAD, anemia, depression, HTN, HLD, DM admitted 11/18/14 [facility] the senior center, where she was exercising and passed out.    Clinical Impression   PTA pt reports that she was independent with ADLs and ambulated with [facility] in the community. Pt requires min A hand held assist for functional mobility and assist for ADLs at this time. She will benefit from acute OT to progress to supervision level to return home with family support.     Follow Up Recommendations  No OT follow up;Supervision/Assistance - 24 hour    Equipment Recommendations  None recommended by OT    Recommendations for Other Services       Precautions / Restrictions Precautions Precautions: Fall Restrictions Weight Bearing Restrictions: No      Mobility Bed Mobility Overal bed mobility: Modified Independent                Transfers Overall transfer level: Needs assistance Equipment used: 1 person hand held assist Transfers: Sit to/from Stand Sit to Stand: Min assist         General transfer comment: Min A hand held assist to stand         ADL Overall ADL's : Needs assistance/impaired Eating/Feeding: Independent;Sitting   Grooming: Minimal assistance;Standing   Upper Body Bathing: Set up;Sitting   Lower Body Bathing: Minimal assistance;Sit to/from stand   Upper Body Dressing : Set up;Sitting   Lower Body Dressing: Minimal assistance;Sit to/from stand   Toilet Transfer: Minimal assistance;Ambulation Toilet Transfer Details (indicate cue type and reason): hand held assist         Functional mobility during ADLs: Minimal assistance (hand held assist) General ADL Comments: Pt requires min A hand held assist to move around room and uses cane for community mobility at baseline. She  reports that she [facility] during the mornings for exercise, shopping, etc.      Vision Additional Comments: No change from baseline          Pertinent Vitals/Pain Pain Assessment: No/denies pain     Hand Dominance Right   Extremity/Trunk Assessment Upper Extremity Assessment Upper Extremity Assessment: Overall WFL for tasks assessed   Lower Extremity Assessment Lower Extremity Assessment: Generalized weakness   Cervical / Trunk Assessment Cervical / Trunk Assessment: Normal   Communication Communication Communication: No difficulties   Cognition Arousal/Alertness: Awake/alert Behavior During Therapy: WFL for tasks assessed/performed Overall Cognitive Status: Within Functional Limits for tasks assessed                                Home Living Family/patient expects to be discharged to:: Private residence Living Arrangements: Children (pt's daughter) Available Help at Discharge: Family;Personal care attendant;Available 24 hours/day Type of Home: Apartment Home Access: Level entry     Home Layout: One level         Bathroom Toilet: Standard     Home Equipment: Walker - 2 wheels;Cane - single point;Bedside commode   Additional Comments: pt reports she has an aide for 6 hours a day M-F who assists with IADLs      Prior Functioning/Environment Level of Independence: Independent with assistive device(s)        Comments: pt reports she uses a cane most recently, and  at times a walker    OT Diagnosis: Generalized weakness;Acute pain   OT Problem List: Decreased strength;Decreased activity tolerance;Impaired balance (sitting and/or standing)   OT Treatment/Interventions: Self-care/ADL training;Therapeutic exercise;Energy conservation;DME and/or AE instruction;Therapeutic activities;Patient/family education;Balance training    OT Goals(Current goals can be found in the care plan section) Acute Rehab OT Goals Patient Stated  Goal: to get back to my home OT Goal Formulation: With patient Time For Goal Achievement: 12/03/14 Potential to Achieve Goals: Good ADL Goals Pt Will Perform Grooming: with supervision;standing Pt Will Perform Lower Body Bathing: with set-up;with supervision;sit to/from stand Pt Will Perform Lower Body Dressing: with set-up;with supervision;sit to/from stand Pt Will Transfer to Toilet: with supervision;ambulating  OT Frequency: Min 1X/week    End of Session Nurse Communication: Mobility status  Activity Tolerance: Patient tolerated treatment well Patient left: in chair;with call bell/phone within reach   Time: 1537-1555 OT Time Calculation (min): 18 min Charges:  OT General Charges $OT Visit: 1 Procedure OT Evaluation $Initial OT Evaluation Tier I: 1 Procedure G-Codes:    Juluis Rainier 2014-12-08, 5:18 PM  Cyndie Chime, OTR/L Occupational Therapist [phone] (pager)

## 2014-11-20 LAB — CBC
HEMATOCRIT: 30.9 % — AB (ref 36.0–46.0)
HEMOGLOBIN: 10.2 g/dL — AB (ref 12.0–15.0)
MCH: 30.1 pg (ref 26.0–34.0)
MCHC: 33 g/dL (ref 30.0–36.0)
MCV: 91.2 fL (ref 78.0–100.0)
Platelets: 230 10*3/uL (ref 150–400)
RBC: 3.39 MIL/uL — ABNORMAL LOW (ref 3.87–5.11)
RDW: 13.1 % (ref 11.5–15.5)
WBC: 5.9 10*3/uL (ref 4.0–10.5)

## 2014-11-20 LAB — URINE CULTURE
CULTURE: NO GROWTH
Colony Count: NO GROWTH
Colony Count: 6000

## 2014-11-20 LAB — BASIC METABOLIC PANEL
ANION GAP: 11 (ref 5–15)
BUN: 53 mg/dL — ABNORMAL HIGH (ref 6–23)
CO2: 21 mmol/L (ref 19–32)
Calcium: 7.8 mg/dL — ABNORMAL LOW (ref 8.4–10.5)
Chloride: 106 mmol/L (ref 96–112)
Creatinine, Ser: 2.86 mg/dL — ABNORMAL HIGH (ref 0.50–1.10)
GFR calc Af Amer: 16 mL/min — ABNORMAL LOW (ref >90)
GFR calc non Af Amer: 14 mL/min — ABNORMAL LOW (ref >90)
GLUCOSE: 75 mg/dL (ref 70–99)
POTASSIUM: 4.2 mmol/L (ref 3.5–5.1)
Sodium: 138 mmol/L (ref 135–145)

## 2014-11-20 LAB — GLUCOSE, CAPILLARY
Glucose-Capillary: 80 mg/dL (ref 70–99)
Glucose-Capillary: 225 mg/dL — ABNORMAL HIGH (ref 70–99)
Glucose-Capillary: 75 mg/dL (ref 70–99)
Glucose-Capillary: 148 mg/dL — ABNORMAL HIGH (ref 70–99)

## 2014-11-20 MED ORDER — INSULIN ASPART 100 UNIT/ML ~~LOC~~ SOLN
4.0000 [IU] | Freq: Three times a day (TID) | SUBCUTANEOUS | Status: DC
  Administered 2014-11-20 – 2014-11-21 (×2): 4 [IU] via SUBCUTANEOUS

## 2014-11-20 NOTE — Clinical Documentation Improvement (Addendum)
"  Acute on CKD (chronic kidney disease) stage 4, GFR 15-29 ml/min - Hold losartan, lasix" documented in current medical record.  "Acute on CKD stage 4" cannot be interpreted to any type of acute kidney or renal condition.  Please document if a condition below provides greater clarity/specificity regarding the patient's renal function:   - Acute Kidney Injury on stage 4 CKD  - Acute Renal Failure on stage 4 CKD  - Other Condition  - Unable to clinically determine    Thank You, Erling Conte ,RN Clinical Documentation Specialist:  [phone] [employer]

## 2014-11-20 NOTE — Evaluation (Signed)
Physical Therapy Evaluation Patient Details Name: [name] MRN: [id number] DOB: Jun 21, 1929 Today's Date: 11/20/2014   History of Present Illness  Pt is an 81 y.o. Female with PMH of CAD, anemia, depression, HTN, HLD, DM admitted 11/18/14 after syncopal episode at the senior center, where she was exercising and passed out.   Clinical Impression  Patient presents with decreased independence with mobility due to deficits listed in PT problem list.  She will benefit from skilled PT in the acute setting to allow return home with intermittent help from family.  Feel she may also benefit from outpatient PT for further gait/balance training and to allow participation in exercise routine with vitals monitoring.    Follow Up Recommendations Outpatient PT    Equipment Recommendations  None recommended by PT    Recommendations for Other Services       Precautions / Restrictions Precautions Precautions: Fall      Mobility  Bed Mobility               General bed mobility comments: pt up in chair  Transfers     Transfers: Sit to/from Stand Sit to Stand: Min assist         General transfer comment: attempted x 3 on her own and fell back into chair  Ambulation/Gait Ambulation/Gait assistance: Min assist Ambulation Distance (Feet): 130 Feet Assistive device: Rolling walker (2 wheeled) Gait Pattern/deviations: Step-through pattern;Decreased stride length;Shuffle     General Gait Details: slightly ataxic gait, limited due to walker not rolling well so assist for walker safety; reports has 4 wheeled walker at home  Stairs            Wheelchair Mobility    Modified Rankin (Stroke Patients Only)       Balance Overall balance assessment: Needs assistance           Standing balance-Leahy Scale: Fair Standing balance comment: stands to wash hands without UE support, for ambulation needs assist in room without walker and reaches for furniture                             Pertinent Vitals/Pain Pain Assessment: Faces Faces Pain Scale: Hurts Apo more Pain Location: left posterior leg Pain Descriptors / Indicators: Sore Pain Intervention(s): Monitored during session    Home Living Family/patient expects to be discharged to:: Private residence Living Arrangements: Children (daughter) Available Help at Discharge: Family;Personal care attendant;Available PRN/intermittently Type of Home: Apartment Home Access: Level entry     Home Layout: One level   Additional Comments: pt reports she has an aide for 6 hours a day M-F who assists with IADLs; daughter works 11-7 night shift    Prior Function Level of Independence: Independent with assistive device(s)         Comments: pt reports she uses a cane most recently, and at times a walker     Hand Dominance   Dominant Hand: Right    Extremity/Trunk Assessment               Lower Extremity Assessment: Generalized weakness         Communication   Communication: No difficulties  Cognition Arousal/Alertness: Awake/alert Behavior During Therapy: WFL for tasks assessed/performed Overall Cognitive Status: No family/caregiver present to determine baseline cognitive functioning                      General Comments      Exercises  Assessment/Plan    PT Assessment Patient needs continued PT services  PT Diagnosis Abnormality of gait;Generalized weakness   PT Problem List Decreased strength;Decreased mobility;Decreased activity tolerance;Decreased safety awareness;Impaired sensation  PT Treatment Interventions Gait training;DME instruction;Therapeutic exercise;Balance training;Stair training;Functional mobility training;Therapeutic activities;Patient/family education   PT Goals (Current goals can be found in the Care Plan section) Acute Rehab PT Goals Patient Stated Goal: to get back to my home PT Goal Formulation: With patient Time For Goal  Achievement: 11/27/14 Potential to Achieve Goals: Good    Frequency Min 3X/week   Barriers to discharge        Co-evaluation               End of Session Equipment Utilized During Treatment: Gait belt Activity Tolerance: Patient tolerated treatment well Patient left: in chair;with call bell/phone within reach           Time: 1347-1407 PT Time Calculation (min) (ACUTE ONLY): 20 min   Charges:   PT Evaluation $Initial PT Evaluation Tier I: 1 Procedure     PT G Codes:        Dalton Mille,CYNDI 11/23/2014, 2:14 PM  Magda Kiel, [place] 2014-11-23

## 2014-11-20 NOTE — Progress Notes (Signed)
PROGRESS NOTE  [name] HYI:[id number] DOB: 07-Jan-1929 DOA: 11/18/2014 PCP: [name], MD  Assessment/Plan: SYNCOPE: Likely vasovagal Orthostatic or dehydration  - Patient had not had anything to eat this morning and was working out weights, creatinine 2.89: baseline 2.1 - 2.3 - telemetry,  -serial cardiac enzymes negative - 2-D echocardiogram ok - Hold furosemide, losartan, place on gentle hydration  UTI -no growth   Uncontrolled type 2 diabetes with renal manifestation - hemoglobin A1c 15.4, place on Levemir and sliding scale insulin- increase as tolerated- here patient is controlled on same rege min- ? compliance at home -diabetic coordinator   Essential hypertension - Continue Imdur, amlodipine   Coronary atherosclerosis: No chest pain or any cardiac symptoms - Continue aspirin, Plavix, Imdur, amlodipine, statin   Dementia - Continue Aricept   Acute on CKD (chronic kidney disease) stage 4, GFR 15-29 ml/min - Hold losartan, lasix  -baseline 2.1- new baseline ma be 2.8  Code Status: full Family Communication: patient and daughter Disposition Plan: PT eval    Consultants:    Procedures:      HPI/Subjective: Says she is taking her lantus at home- daughter gives in different spots  Objective: Filed Vitals:   11/20/14 0608  BP: 141/46  Pulse: 65  Temp: 98.2 F (36.8 C)  Resp: 18    Intake/Output Summary (Last 24 hours) at 11/20/14 1317 Last data filed at 11/20/14 0956  Gross per 24 hour  Intake 2651.25 ml  Output    750 ml  Net 1901.25 ml   Filed Weights   11/18/14 1037 11/18/14 1853  Weight: 77.111 kg (170 lb) 72.984 kg (160 lb 14.4 oz)    Exam:   General:  A+Ox3, NAD  Cardiovascular: rrr  Respiratory: clear  Abdomen: +BS, soft  Musculoskeletal: no edema  Data Reviewed: Basic Metabolic Panel:  Recent Labs Lab 11/18/14 1400 11/18/14 2040 11/19/14 0656 11/20/14 0525  NA 133*  --  134* 138  K 4.4  --  4.4 4.2    CL 100  --  102 106  CO2 20  --  22 21  GLUCOSE 277*  --  124* 75  BUN 55*  --  54* 53*  CREATININE 2.89* 2.94* 2.70* 2.86*  CALCIUM 8.6  --  8.3* 7.8*   Liver Function Tests: No results for input(s): AST, ALT, ALKPHOS, BILITOT, PROT, ALBUMIN in the last 168 hours. No results for input(s): LIPASE, AMYLASE in the last 168 hours. No results for input(s): AMMONIA in the last 168 hours. CBC:  Recent Labs Lab 11/18/14 1400 11/18/14 2040 11/19/14 0656 11/20/14 0525  WBC 5.9 5.8 5.6 5.9  NEUTROABS 3.6  --   --   --   HGB 11.8* 11.7* 11.5* 10.2*  HCT 34.6* 35.3* 34.5* 30.9*  MCV 89.2 89.1 89.6 91.2  PLT 237 260 252 230   Cardiac Enzymes:  Recent Labs Lab 11/18/14 1400 11/18/14 2040 11/18/14 2339 11/19/14 0656  TROPONINI <0.03 <0.03 <0.03 <0.03   BNP (last 3 results) No results for input(s): BNP in the last 8760 hours.  ProBNP (last 3 results) No results for input(s): PROBNP in the last 8760 hours.  CBG:  Recent Labs Lab 11/19/14 1159 11/19/14 1710 11/19/14 2110 11/20/14 0800 11/20/14 1203  GLUCAP 325* 175* 222* 80 225*    Recent Results (from the past 240 hour(s))  Urine culture     Status: None   Collection Time: 11/18/14 12:15 PM  Result Value Ref Range Status   Specimen Description URINE, CATHETERIZED  Final   Special Requests NONE  Final   Colony Count NO GROWTH Performed at [facility]   Final   Culture NO GROWTH Performed at [facility]   Final   Report Status 11/20/2014 FINAL  Final  Urine culture     Status: None   Collection Time: 11/19/14  4:07 AM  Result Value Ref Range Status   Specimen Description URINE, CLEAN CATCH  Final   Special Requests NONE  Final   Colony Count   Final    6,000 COLONIES/ML Performed at [facility]    Culture   Final    INSIGNIFICANT GROWTH Performed at [facility]    Report Status 11/20/2014 FINAL  Final     Studies: Dg Chest 2 View  11/18/2014   CLINICAL DATA:   81 year old female with weakness. History of hypertension, coronary artery disease and breast cancer. Initial encounter.  EXAM: CHEST  2 VIEW  COMPARISON:  09/19/2014 and 10/01/2012.  FINDINGS: Cardiomegaly.  Central pulmonary vascular prominence.  Chronic lung changes similar to 2014. No segmental consolidation or plain film evidence pulmonary malignancy.  Calcified aorta.  IMPRESSION: Cardiomegaly.  Central pulmonary vascular prominence.  Chronic lung changes similar to 2014. No segmental consolidation or plain film evidence pulmonary malignancy.   Electronically Signed   By: Genia Del M.D.   On: 11/18/2014 13:58    Scheduled Meds: . amLODipine  5 mg Oral Daily  . aspirin EC  81 mg Oral Daily  . atorvastatin  20 mg Oral Daily  . calcium-vitamin D  1 tablet Oral BID  . clopidogrel  75 mg Oral Daily  . donepezil  10 mg Oral QHS  . enoxaparin (LOVENOX) injection  30 mg Subcutaneous Q24H  . ferrous sulfate  325 mg Oral Q breakfast  . insulin aspart  0-5 Units Subcutaneous QHS  . insulin aspart  0-9 Units Subcutaneous TID WC  . insulin aspart  4 Units Subcutaneous TID WC  . insulin detemir  25 Units Subcutaneous QHS  . isosorbide mononitrate  60 mg Oral Daily  . pantoprazole  40 mg Oral Q0600  . PARoxetine  10 mg Oral QHS  . pregabalin  75 mg Oral BID  . sodium chloride  3 mL Intravenous Q12H   Continuous Infusions:   Antibiotics Given (last 72 hours)    Date/Time Action Medication Dose Rate   11/19/14 1046 Given   cefTRIAXone (ROCEPHIN) 1 g in dextrose 5 % 50 mL IVPB - Premix 1 g 100 mL/hr   11/20/14 1012 Given   cefTRIAXone (ROCEPHIN) 1 g in dextrose 5 % 50 mL IVPB - Premix 1 g 100 mL/hr      Principal Problem:   SYNCOPE Active Problems:   Uncontrolled type 2 diabetes with renal manifestation   Essential hypertension   Coronary atherosclerosis   Dementia   CKD (chronic kidney disease) stage 4, GFR 15-29 ml/min   Syncope    Time spent: 25 min    [name],  [name]  [facility] Pager [phone]. If 7PM-7AM, please contact night-coverage at [email or web address], password [facility] 11/20/2014, 1:17 PM  LOS: 1 day

## 2014-11-20 NOTE — Progress Notes (Signed)
Spoke with patient in regards to her diabetes.  States that her daughter draws up her insulin and she takes it every day after 12:30 pm.  States that her blood sugars are usually greater than 400 mg/dl during the day and sometimes around 200 mg/dl in the am. Reviewed with her the results of her HgbA1C of 15.4%. Given a scale of average blood sugars and recommendation for seeing Dr. Renne Crigler after discharge. Daughter not available at this time.  Will continue to follow while in hospital. Harvel Ricks RN BSN CDE

## 2014-11-21 LAB — GLUCOSE, CAPILLARY
GLUCOSE-CAPILLARY: 62 mg/dL — AB (ref 70–99)
GLUCOSE-CAPILLARY: 84 mg/dL (ref 70–99)
Glucose-Capillary: 289 mg/dL — ABNORMAL HIGH (ref 70–99)

## 2014-11-21 MED ORDER — INSULIN DETEMIR 100 UNIT/ML ~~LOC~~ SOLN
20.0000 [IU] | Freq: Every day | SUBCUTANEOUS | Status: DC
  Filled 2014-11-21: qty 0.2

## 2014-11-21 MED ORDER — INSULIN DETEMIR 100 UNIT/ML FLEXPEN: PEN_INJECTOR | SUBCUTANEOUS | Status: DC

## 2014-11-21 NOTE — Progress Notes (Signed)
NCM sent referral to [facility] for outpt physical therapy and informed daughter, [name] to call if they have not heard from them by Tuesday, also gave daughter directions.

## 2014-11-21 NOTE — Plan of Care (Signed)
Problem: Consults Goal: Diagnosis-Diabetes Mellitus Hyperglycemia     

## 2014-11-21 NOTE — Discharge Summary (Signed)
Physician Discharge Summary  [name] OQH:[id number] DOB: 01/09/1929 DOA: 11/18/2014  PCP: [name], MD  Admit date: 11/18/2014 Discharge date: 11/21/2014  Time spent: 35 minutes  Recommendations for Outpatient Follow-up:  1. More diabetic education for consistancy  Discharge Diagnoses:  Principal Problem:   SYNCOPE Active Problems:   Uncontrolled type 2 diabetes with renal manifestation   Essential hypertension   Coronary atherosclerosis   Dementia   CKD (chronic kidney disease) stage 4, GFR 15-29 ml/min   Syncope   Discharge Condition: improved  Diet recommendation: cardiac/diabetic  Filed Weights   11/18/14 1037 11/18/14 1853  Weight: 77.111 kg (170 lb) 72.984 kg (160 lb 14.4 oz)    History of present illness:  Patient is a 81 year old female with CAD, anemia, depression, hypertension, hyperlipidemia, diabetes mellitus, insulin-dependent presented with EMS for a brief syncopal episode. Patient apparently was exercising and working out with weights at a senior center when she felt dizzy, lightheaded, weak and nausea today. Subsequently she was noted by the staff to have passed out. Patient denied any chest pain, diaphoresis, shortness of breath prior to the episode. She felt that everything was blurry before she passed out. There was no reported seizure-like activity, no incontinence of bowel and bladder or tongue biting. Per patient she did not have breakfast today. Patient also reports that she was having difficulty urinating this morning .Otherwise no dysuria or hematuria   Hospital Course:  SYNCOPE: Likely vasovagal Orthostatic or dehydration  - Patient had not had anything to eat this morning and was working out weights, creatinine 2.89: baseline 2.1 - 2.3 - telemetry,  -serial cardiac enzymes negative - 2-D echocardiogram ok  UTI -no growth   Uncontrolled type 2 diabetes with renal manifestation - hemoglobin A1c 15.4, place on Levemir and sliding  scale insulin- increase as tolerated- here patient is controlled on same rege min- ? compliance at home -diabetic coordinator   Essential hypertension - Continue Imdur, amlodipine   Coronary atherosclerosis: No chest pain or any cardiac symptoms - Continue aspirin, Plavix, Imdur, amlodipine, statin   Dementia - Continue Aricept   Acute on CKD (chronic kidney disease) stage 4, GFR 15-29 ml/min - Hold losartan, lasix  -baseline 2.1- new baseline   Procedures:    Consultations:    Discharge Exam: Filed Vitals:   11/21/14 0538  BP: 150/53  Pulse: 66  Temp: 98.5 F (36.9 C)  Resp: 18    General: pleasant/cooperative Cardiovascular: rrr Respiratory: clear  Discharge Instructions   Discharge Instructions    Diet - low sodium heart healthy    Complete by:  As directed      Diet Carb Modified    Complete by:  As directed      Discharge instructions    Complete by:  As directed   Take humulin only if eating Take insulin consistantly     Increase activity slowly    Complete by:  As directed           Current Discharge Medication List    CONTINUE these medications which have CHANGED   Details  Insulin Detemir (LEVEMIR FLEXTOUCH) 100 UNIT/ML Pen INJECT 22 UNITS SUBCUTANEOUSLY EVERY NIGHT AT BEDTIME Qty: 15 mL, Refills: 3      CONTINUE these medications which have NOT CHANGED   Details  amLODipine (NORVASC) 5 MG tablet Take 1 tablet (5 mg total) by mouth daily. Qty: 30 tablet, Refills: 5    aspirin 81 MG tablet Take 1 tablet (81 mg total) by mouth daily.  Qty: 30 tablet, Refills: 3    atorvastatin (LIPITOR) 20 MG tablet TAKE 1 TABLET BY MOUTH EVERY DAY Qty: 30 tablet, Refills: 5    Calcium Carbonate-Vitamin D 600-400 MG-UNIT per tablet Take 1 tablet by mouth 2 (two) times daily.    clopidogrel (PLAVIX) 75 MG tablet TAKE 1 TABLET BY MOUTH DAILY Qty: 30 tablet, Refills: 5    donepezil (ARICEPT) 10 MG tablet Take 1 tablet (10 mg total) by mouth at  bedtime. Qty: 90 tablet, Refills: 1    ferrous sulfate 325 (65 FE) MG tablet Take 325 mg by mouth daily with breakfast.    HUMULIN R 100 UNIT/ML injection INJECT 8 UNITS SUBCUTANEOUSLY THREE TIMES A DAY (BEFORE MEALS) Qty: 10 mL, Refills: 2    HYDROcodone-acetaminophen (NORCO/VICODIN) 5-325 MG per tablet Take 1 tablet by mouth every 4 (four) hours as needed for moderate pain. Qty: 15 tablet, Refills: 0    hydrOXYzine (ATARAX/VISTARIL) 10 MG tablet Take 1 tablet (10 mg total) by mouth 3 (three) times daily as needed for itching. Qty: 30 tablet, Refills: 0    Insulin Pen Needle (EASY TOUCH PEN NEEDLES) 31G X 8 MM MISC Inject 1 pen into the skin daily. Qty: 100 each, Refills: 3    isosorbide mononitrate (IMDUR) 60 MG 24 hr tablet Take 60 mg by mouth daily.    losartan (COZAAR) 50 MG tablet Take 1 tablet (50 mg total) by mouth daily. Qty: 30 tablet, Refills: 3    pantoprazole (PROTONIX) 40 MG tablet Take 1 tablet (40 mg total) by mouth daily at 6 (six) AM. Qty: 30 tablet, Refills: 3    PARoxetine (PAXIL) 10 MG tablet Take 1 tablet (10 mg total) by mouth at bedtime. Qty: 90 tablet, Refills: 1    pregabalin (LYRICA) 75 MG capsule Take 1 capsule (75 mg total) by mouth 2 (two) times daily. Qty: 60 capsule, Refills: 5    traMADol (ULTRAM) 50 MG tablet Take 1 tablet (50 mg total) by mouth every 8 (eight) hours as needed. Qty: 30 tablet, Refills: 0   Associated Diagnoses: Headache(784.0)    triamcinolone cream (KENALOG) 0.1 % Apply topically 2 (two) times daily. Qty: 15 g, Refills: 3      STOP taking these medications     furosemide (LASIX) 40 MG tablet      levofloxacin (LEVAQUIN) 500 MG tablet      nitroGLYCERIN (NITROSTAT) 0.4 MG SL tablet        Allergies  Allergen Reactions  . Ace Inhibitors Cough   Follow-up Information    Follow up with [name], MD In 1 week.   Specialty:  Internal Medicine   Contact information:   [address] [place] [address] [phone]        The results of significant diagnostics from this hospitalization (including imaging, microbiology, ancillary and laboratory) are listed below for reference.    Significant Diagnostic Studies: Dg Chest 2 View  11/18/2014   CLINICAL DATA:  81 year old female with weakness. History of hypertension, coronary artery disease and breast cancer. Initial encounter.  EXAM: CHEST  2 VIEW  COMPARISON:  09/19/2014 and 10/01/2012.  FINDINGS: Cardiomegaly.  Central pulmonary vascular prominence.  Chronic lung changes similar to 2014. No segmental consolidation or plain film evidence pulmonary malignancy.  Calcified aorta.  IMPRESSION: Cardiomegaly.  Central pulmonary vascular prominence.  Chronic lung changes similar to 2014. No segmental consolidation or plain film evidence pulmonary malignancy.   Electronically Signed  By: Genia Del M.D.   On: 11/18/2014 13:58   Ct Head Wo Contrast  11/18/2014   CLINICAL DATA:  80 year old hypertensive female fell weak this morning. Initial encounter.  EXAM: CT HEAD WITHOUT CONTRAST  TECHNIQUE: Contiguous axial images were obtained from the base of the skull through the vertex without intravenous contrast.  COMPARISON:  04/01/2012.  FINDINGS: No intracranial hemorrhage or CT evidence of large acute infarct.  Small vessel disease type changes.  Global atrophy without hydrocephalus.  No intracranial mass lesion noted on this unenhanced exam.  Vascular calcifications.  Orbital structures unremarkable.  Mastoid air cells, middle ear cavities and visualized paranasal sinuses are clear.  IMPRESSION: IMPRESSION  No intracranial hemorrhage or CT evidence of large acute infarct.  Small vessel disease type changes.   Electronically Signed   By: Genia Del M.D.   On: 11/18/2014 13:17    Microbiology: Recent Results (from the past 240 hour(s))  Urine culture     Status: None   Collection Time: 11/18/14 12:15 PM  Result Value Ref  Range Status   Specimen Description URINE, CATHETERIZED  Final   Special Requests NONE  Final   Colony Count NO GROWTH Performed at [facility]   Final   Culture NO GROWTH Performed at [facility]   Final   Report Status 11/20/2014 FINAL  Final  Urine culture     Status: None   Collection Time: 11/19/14  4:07 AM  Result Value Ref Range Status   Specimen Description URINE, CLEAN CATCH  Final   Special Requests NONE  Final   Colony Count   Final    6,000 COLONIES/ML Performed at [facility]    Culture   Final    INSIGNIFICANT GROWTH Performed at [facility]    Report Status 11/20/2014 FINAL  Final     Labs: Basic Metabolic Panel:  Recent Labs Lab 11/18/14 1400 11/18/14 2040 11/19/14 0656 11/20/14 0525  NA 133*  --  134* 138  K 4.4  --  4.4 4.2  CL 100  --  102 106  CO2 20  --  22 21  GLUCOSE 277*  --  124* 75  BUN 55*  --  54* 53*  CREATININE 2.89* 2.94* 2.70* 2.86*  CALCIUM 8.6  --  8.3* 7.8*   Liver Function Tests: No results for input(s): AST, ALT, ALKPHOS, BILITOT, PROT, ALBUMIN in the last 168 hours. No results for input(s): LIPASE, AMYLASE in the last 168 hours. No results for input(s): AMMONIA in the last 168 hours. CBC:  Recent Labs Lab 11/18/14 1400 11/18/14 2040 11/19/14 0656 11/20/14 0525  WBC 5.9 5.8 5.6 5.9  NEUTROABS 3.6  --   --   --   HGB 11.8* 11.7* 11.5* 10.2*  HCT 34.6* 35.3* 34.5* 30.9*  MCV 89.2 89.1 89.6 91.2  PLT 237 260 252 230   Cardiac Enzymes:  Recent Labs Lab 11/18/14 1400 11/18/14 2040 11/18/14 2339 11/19/14 0656  TROPONINI <0.03 <0.03 <0.03 <0.03   BNP: BNP (last 3 results) No results for input(s): BNP in the last 8760 hours.  ProBNP (last 3 results) No results for input(s): PROBNP in the last 8760 hours.  CBG:  Recent Labs Lab 11/20/14 1203 11/20/14 1734 11/20/14 2153 11/21/14 0809 11/21/14 0844  GLUCAP 225* 75 148* 62* 84       Signed:  Kylil Swopes,  Sharnelle Cappelli  [facility] 11/21/2014, 10:13 AM

## 2014-11-21 NOTE — Progress Notes (Signed)
Nsg Discharge Note  Admit Date:  11/18/2014 Discharge date: 11/21/2014   Stanton Kidney Slabach to be D/C'd Home with outpatient PT per MD order.  AVS completed.  Copy for chart, and copy for patient signed, and dated. Patient/caregiver able to verbalize understanding.  Discharge Medication:   Medication List    STOP taking these medications        furosemide 40 MG tablet  Commonly known as:  LASIX     levofloxacin 500 MG tablet  Commonly known as:  LEVAQUIN     nitroGLYCERIN 0.4 MG SL tablet  Commonly known as:  NITROSTAT      TAKE these medications        amLODipine 5 MG tablet  Commonly known as:  NORVASC  Take 1 tablet (5 mg total) by mouth daily.     aspirin 81 MG tablet  Take 1 tablet (81 mg total) by mouth daily.     atorvastatin 20 MG tablet  Commonly known as:  LIPITOR  TAKE 1 TABLET BY MOUTH EVERY DAY     Calcium Carbonate-Vitamin D 600-400 MG-UNIT per tablet  Take 1 tablet by mouth 2 (two) times daily.     clopidogrel 75 MG tablet  Commonly known as:  PLAVIX  TAKE 1 TABLET BY MOUTH DAILY     donepezil 10 MG tablet  Commonly known as:  ARICEPT  Take 1 tablet (10 mg total) by mouth at bedtime.     ferrous sulfate 325 (65 FE) MG tablet  Take 325 mg by mouth daily with breakfast.     HUMULIN R 100 units/mL injection  Generic drug:  insulin regular  INJECT 8 UNITS SUBCUTANEOUSLY THREE TIMES A DAY (BEFORE MEALS)     HYDROcodone-acetaminophen 5-325 MG per tablet  Commonly known as:  NORCO/VICODIN  Take 1 tablet by mouth every 4 (four) hours as needed for moderate pain.     hydrOXYzine 10 MG tablet  Commonly known as:  ATARAX/VISTARIL  Take 1 tablet (10 mg total) by mouth 3 (three) times daily as needed for itching.     Insulin Detemir 100 UNIT/ML Pen  Commonly known as:  LEVEMIR FLEXTOUCH  INJECT 22 UNITS SUBCUTANEOUSLY EVERY NIGHT AT BEDTIME     Insulin Pen Needle 31G X 8 MM Misc  Commonly known as:  EASY TOUCH PEN NEEDLES  Inject 1 pen into the skin  daily.     isosorbide mononitrate 60 MG 24 hr tablet  Commonly known as:  IMDUR  Take 60 mg by mouth daily.     losartan 50 MG tablet  Commonly known as:  COZAAR  Take 1 tablet (50 mg total) by mouth daily.     pantoprazole 40 MG tablet  Commonly known as:  PROTONIX  Take 1 tablet (40 mg total) by mouth daily at 6 (six) AM.     PARoxetine 10 MG tablet  Commonly known as:  PAXIL  Take 1 tablet (10 mg total) by mouth at bedtime.     pregabalin 75 MG capsule  Commonly known as:  LYRICA  Take 1 capsule (75 mg total) by mouth 2 (two) times daily.     traMADol 50 MG tablet  Commonly known as:  ULTRAM  Take 1 tablet (50 mg total) by mouth every 8 (eight) hours as needed.     triamcinolone cream 0.1 %  Commonly known as:  KENALOG  Apply topically 2 (two) times daily.        Discharge Assessment: Filed Vitals:   11/21/14  0538  BP: 150/53  Pulse: 66  Temp: 98.5 F (36.9 C)  Resp: 18   Skin clean, dry and intact without evidence of skin break down, no evidence of skin tears noted. IV catheter discontinued intact. Site without signs and symptoms of complications - no redness or edema noted at insertion site, patient denies c/o pain - only slight tenderness at site.  Dressing with slight pressure applied.  D/c Instructions-Education: Discharge instructions given to patient/family with verbalized understanding. D/c education completed with patient/family including follow up instructions, medication list, d/c activities limitations if indicated, with other d/c instructions as indicated by MD - patient able to verbalize understanding, all questions fully answered. Patient instructed to return to ED, call 911, or call MD for any changes in condition.  Patient escorted via [place], and D/C home via private auto.  Dayle Points, RN 11/21/2014 12:50 PM

## 2014-11-21 NOTE — Care Management Note (Signed)
    Page 1 of 1   11/21/2014     1:48:24 PM CARE MANAGEMENT NOTE 11/21/2014  Patient:  [name], [name]   Account Number:  [id number]  Date Initiated:  11/21/2014  Documentation initiated by:  Tomi Bamberger  Subjective/Objective Assessment:   dx syncope  admit - lives with daughter, [name].     Action/Plan:   pt eval- outpt pt   Anticipated DC Date:  11/21/2014   Anticipated DC Plan:  [facility]  CM consult      Choice offered to / List presented to:             Status of service:  Completed, signed off Medicare Important Message given?  YES (If response is "NO", the following Medicare IM given date fields will be blank) Date Medicare IM given:  11/21/2014 Medicare IM given by:  Tomi Bamberger Date Additional Medicare IM given:   Additional Medicare IM given by:    Discharge Disposition:  HOME/SELF CARE  Per UR Regulation:  Reviewed for med. necessity/level of care/duration of stay  If discussed at [place] of Stay Meetings, dates discussed:    Comments:  11/21/14 [place], BSN [phone] patient dc home with daughter, NCM set up outpt physical therapy for patient thru epic.

## 2014-11-21 NOTE — Progress Notes (Signed)
OT Cancellation Note  Patient Details Name: [name] MRN: [id number] DOB: 1929/03/27   Cancelled Treatment:    Reason Eval/Treat Not Completed: Patient declined, no reason specified. Pt preparing to d/c home this afternoon and declined tx  Britt Bottom 11/21/2014, 12:28 PM

## 2014-11-21 NOTE — Progress Notes (Signed)
Hypoglycemic Event  CBG: 62  Treatment: 15 GM carbohydrate snack  Symptoms: None  Follow-up CBG: [id number] CBG Result:84  Possible Reasons for Event: Unknown  Comments/MD notified:YES    [name] L  Remember to initiate Hypoglycemia Order Set & complete

## 2014-11-28 ENCOUNTER — Other Ambulatory Visit: Payer: Self-pay | Admitting: Internal Medicine

## 2014-11-28 ENCOUNTER — Encounter (HOSPITAL_COMMUNITY)
Admission: RE | Admit: 2014-11-28 | Discharge: 2014-11-28 | Disposition: A | Discharge status: Home / Self Care | Payer: Medicare Other | Source: Ambulatory Visit | Attending: Nephrology | Admitting: Nephrology

## 2014-11-28 ENCOUNTER — Ambulatory Visit (INDEPENDENT_AMBULATORY_CARE_PROVIDER_SITE_OTHER): Payer: Medicare Other | Admitting: Internal Medicine

## 2014-11-28 ENCOUNTER — Encounter: Payer: Self-pay | Admitting: Internal Medicine

## 2014-11-28 VITALS — BP 138/70 | HR 61 | Temp 97.8°F | Resp 15 | Ht 63.0 in | Wt 160.5 lb

## 2014-11-28 DIAGNOSIS — E1165 Type 2 diabetes mellitus with hyperglycemia: Secondary | ICD-10-CM

## 2014-11-28 DIAGNOSIS — R55 Syncope and collapse: Secondary | ICD-10-CM | POA: Diagnosis not present

## 2014-11-28 DIAGNOSIS — N183 Chronic kidney disease, stage 3 (moderate): Secondary | ICD-10-CM | POA: Insufficient documentation

## 2014-11-28 DIAGNOSIS — E1129 Type 2 diabetes mellitus with other diabetic kidney complication: Secondary | ICD-10-CM | POA: Diagnosis not present

## 2014-11-28 DIAGNOSIS — F0390 Unspecified dementia without behavioral disturbance: Secondary | ICD-10-CM

## 2014-11-28 DIAGNOSIS — IMO0002 Reserved for concepts with insufficient information to code with codable children: Secondary | ICD-10-CM

## 2014-11-28 DIAGNOSIS — D631 Anemia in chronic kidney disease: Secondary | ICD-10-CM | POA: Insufficient documentation

## 2014-11-28 LAB — POCT HEMOGLOBIN-HEMACUE: Hemoglobin: 11.9 g/dL — ABNORMAL LOW (ref 12.0–15.0)

## 2014-11-28 MED ORDER — EPOETIN ALFA 10000 UNIT/ML IJ SOLN
INTRAMUSCULAR | Status: AC
  Filled 2014-11-28: qty 1

## 2014-11-28 MED ORDER — EPOETIN ALFA 10000 UNIT/ML IJ SOLN
10000.0000 [IU] | INTRAMUSCULAR | Status: DC
  Administered 2014-11-28: 10000 [IU] via SUBCUTANEOUS

## 2014-11-28 MED ORDER — "INSULIN SYRINGE 28G X 1/2"" 1 ML MISC": Status: DC

## 2014-11-28 NOTE — Patient Instructions (Signed)
You may return to the senior exercise program.   It is critical that you  eat before going to the exercise program. If you do not eat and take your insulin the blood sugar can drop dramatically resulting in loss of consciousness or even seizures.  Please follow-up with the endocrinologist. Unless that doctor states typically; you morning glucoses range should be 150-200.

## 2014-11-28 NOTE — Progress Notes (Signed)
Pre visit review using our clinic review tool, if applicable. No additional management support is needed unless otherwise documented below in the visit note. 

## 2014-11-28 NOTE — Progress Notes (Signed)
   Subjective:    Patient ID: [name], female    DOB: 1928/09/14, 81 y.o.   MRN: [id number]  HPI She was hospitalized 4/26-4/29/16 with syncope. This occurred after she had been working out with [employer] at a senior center and became lightheaded & dizzy. She had not eaten that day;but she had taken her insulin that morning. The staff found her passed out. There apparently was no neuro or cardiac prodrome prior to the event;but with her dementia history is unreliable..  In the hospital her A1c was found to be 15.4% which would correlate with a random glucose of over 460. Simultaneous glucose with A1c was 395. Unfortunately this will also be associated with an over 200% increased risk of heart attack or stroke. These data were discussed with Diane, her daughter. There was no rise in cardiac enzymes. Her creatinine ranged from 2.7 -2.94.   There had been some question concerning referrals to Endocrinology in past as prior insurance  was [place] [employer]. That has been addressed.  She has seen Dr.Gherghe and they're to make a follow-up appointment.   Review of Systems Denied were any change in heart rhythm or rate prior to the event. There was no associated chest pain or shortness of breath . Also specifically denied prior to the episode were headache, limb weakness, tingling, or numbness. No seizure activity noted.  Prior to her visit with the endocrinologist her morning glucoses had been typically well over 300 and up to 500. She never had any low blood sugars. On the regimen from the endocrinologist her morning sugars range from 120-300 plus. There are no definite episodes of hypoglycemia but but this is difficult to tell in reference to status change.     Objective:   Physical Exam  Pertinent or positive findings include:  Interactive & pleasant but totally disoriented. She has complete dentures which are immaculate.  She has a grade 1 systolic murmur at the left base. Her chest is  surprisingly clear.  Pedal pulses are decreased.  General appearance :adequately nourished; in no distress. Eyes: No conjunctival inflammation or scleral icterus is present. Oral exam:  Lips and gums are healthy appearing.There is no oropharyngeal erythema or exudate noted.  Heart:  Normal rate and regular rhythm. S1 and S2 normal without gallop, click, rub or other extra sounds   Lungs:Chest clear to auscultation; no wheezes, rhonchi,rales ,or rubs present.No increased work of breathing.  Abdomen: bowel sounds normal, soft and non-tender without masses, organomegaly or hernias noted.  No guarding or rebound.  Vascular : all pulses equal ; no bruits present. Skin:Warm & dry.  Intact without suspicious lesions or rashes ; no tenting  Lymphatic: No lymphadenopathy is noted about the head, neck, axilla Neuro: Strength, tone normal. Broad slow gait;uses cane       Assessment & Plan:  #1 syncope most likely related to hypoglycemia in the context of taking insulin and not eating  #2 severely uncontrolled diabetes; there has been some improvement but follow up with Endocrinologist is necessary.  #3 renal insufficiency, stable  Plan: She can return to the senior center which she is anxious to do. It is imperative that she eat prior to going if she has taken her insulin. Follow-up with endocrinology is critical to prevent adverse complications of uncontrolled diabetes. The most important goal would to be avoid any hypoglycemia. I gave her daughter range of 150-200 for morning glucoses but the Endocrinologist may change that.

## 2014-12-04 DIAGNOSIS — E785 Hyperlipidemia, unspecified: Secondary | ICD-10-CM | POA: Diagnosis not present

## 2014-12-04 DIAGNOSIS — I10 Essential (primary) hypertension: Secondary | ICD-10-CM | POA: Diagnosis not present

## 2014-12-04 DIAGNOSIS — N189 Chronic kidney disease, unspecified: Secondary | ICD-10-CM | POA: Diagnosis not present

## 2014-12-04 DIAGNOSIS — I252 Old myocardial infarction: Secondary | ICD-10-CM | POA: Diagnosis not present

## 2014-12-04 DIAGNOSIS — I2510 Atherosclerotic heart disease of native coronary artery without angina pectoris: Secondary | ICD-10-CM | POA: Diagnosis not present

## 2014-12-04 DIAGNOSIS — E119 Type 2 diabetes mellitus without complications: Secondary | ICD-10-CM | POA: Diagnosis not present

## 2014-12-04 DIAGNOSIS — I209 Angina pectoris, unspecified: Secondary | ICD-10-CM | POA: Diagnosis not present

## 2014-12-09 DIAGNOSIS — Z23 Encounter for immunization: Secondary | ICD-10-CM | POA: Diagnosis not present

## 2014-12-23 ENCOUNTER — Telehealth: Payer: Self-pay

## 2014-12-23 NOTE — Telephone Encounter (Signed)
dtr contacted. FL2 is ready for pick up.

## 2014-12-26 ENCOUNTER — Other Ambulatory Visit: Payer: Self-pay | Admitting: Internal Medicine

## 2014-12-26 ENCOUNTER — Encounter (HOSPITAL_COMMUNITY)
Admission: RE | Admit: 2014-12-26 | Discharge: 2014-12-26 | Disposition: A | Discharge status: Home / Self Care | Payer: Medicare Other | Source: Ambulatory Visit | Attending: Nephrology | Admitting: Nephrology

## 2014-12-26 DIAGNOSIS — D631 Anemia in chronic kidney disease: Secondary | ICD-10-CM | POA: Diagnosis not present

## 2014-12-26 DIAGNOSIS — N183 Chronic kidney disease, stage 3 (moderate): Secondary | ICD-10-CM | POA: Insufficient documentation

## 2014-12-26 LAB — IRON AND TIBC
Iron: 142 ug/dL (ref 28–170)
SATURATION RATIOS: 41 % — AB (ref 10.4–31.8)
TIBC: 347 ug/dL (ref 250–450)
UIBC: 205 ug/dL

## 2014-12-26 LAB — POCT HEMOGLOBIN-HEMACUE: HEMOGLOBIN: 11.8 g/dL — AB (ref 12.0–15.0)

## 2014-12-26 LAB — FERRITIN: FERRITIN: 111 ng/mL (ref 11–307)

## 2014-12-26 MED ORDER — EPOETIN ALFA 10000 UNIT/ML IJ SOLN
INTRAMUSCULAR | Status: AC
  Filled 2014-12-26: qty 1

## 2014-12-26 MED ORDER — EPOETIN ALFA 10000 UNIT/ML IJ SOLN
10000.0000 [IU] | INTRAMUSCULAR | Status: DC
  Administered 2014-12-26: 10000 [IU] via SUBCUTANEOUS

## 2015-01-23 ENCOUNTER — Encounter (HOSPITAL_COMMUNITY)
Admission: RE | Admit: 2015-01-23 | Discharge: 2015-01-23 | Disposition: A | Discharge status: Home / Self Care | Payer: Medicare Other | Source: Ambulatory Visit | Attending: Nephrology | Admitting: Nephrology

## 2015-01-23 DIAGNOSIS — D631 Anemia in chronic kidney disease: Secondary | ICD-10-CM | POA: Diagnosis not present

## 2015-01-23 DIAGNOSIS — N183 Chronic kidney disease, stage 3 (moderate): Secondary | ICD-10-CM | POA: Insufficient documentation

## 2015-01-23 LAB — POCT HEMOGLOBIN-HEMACUE: HEMOGLOBIN: 11.9 g/dL — AB (ref 12.0–15.0)

## 2015-01-23 MED ORDER — EPOETIN ALFA 10000 UNIT/ML IJ SOLN
INTRAMUSCULAR | Status: AC
  Filled 2015-01-23: qty 1

## 2015-01-23 MED ORDER — EPOETIN ALFA 10000 UNIT/ML IJ SOLN
10000.0000 [IU] | INTRAMUSCULAR | Status: DC
  Administered 2015-01-23: 10000 [IU] via SUBCUTANEOUS

## 2015-02-20 ENCOUNTER — Encounter (HOSPITAL_COMMUNITY)
Admission: RE | Admit: 2015-02-20 | Discharge: 2015-02-20 | Disposition: A | Discharge status: Home / Self Care | Payer: Medicare Other | Source: Ambulatory Visit | Attending: Nephrology | Admitting: Nephrology

## 2015-02-20 DIAGNOSIS — D631 Anemia in chronic kidney disease: Secondary | ICD-10-CM | POA: Diagnosis not present

## 2015-02-20 LAB — IRON AND TIBC
Iron: 83 ug/dL (ref 28–170)
Saturation Ratios: 26 % (ref 10.4–31.8)
TIBC: 321 ug/dL (ref 250–450)
UIBC: 238 ug/dL

## 2015-02-20 LAB — FERRITIN: Ferritin: 102 ng/mL (ref 11–307)

## 2015-02-20 LAB — POCT HEMOGLOBIN-HEMACUE: HEMOGLOBIN: 10.9 g/dL — AB (ref 12.0–15.0)

## 2015-02-20 MED ORDER — EPOETIN ALFA 10000 UNIT/ML IJ SOLN
10000.0000 [IU] | INTRAMUSCULAR | Status: DC
  Administered 2015-02-20: 10000 [IU] via SUBCUTANEOUS

## 2015-02-20 MED ORDER — EPOETIN ALFA 10000 UNIT/ML IJ SOLN
INTRAMUSCULAR | Status: AC
  Filled 2015-02-20: qty 1

## 2015-03-02 ENCOUNTER — Other Ambulatory Visit: Payer: Self-pay | Admitting: Internal Medicine

## 2015-03-19 ENCOUNTER — Other Ambulatory Visit (HOSPITAL_COMMUNITY): Payer: Self-pay | Admitting: *Deleted

## 2015-03-20 ENCOUNTER — Encounter (HOSPITAL_COMMUNITY)
Admission: RE | Admit: 2015-03-20 | Discharge: 2015-03-20 | Disposition: A | Discharge status: Home / Self Care | Payer: Medicare Other | Source: Ambulatory Visit | Attending: Nephrology | Admitting: Nephrology

## 2015-03-20 DIAGNOSIS — N183 Chronic kidney disease, stage 3 (moderate): Secondary | ICD-10-CM | POA: Insufficient documentation

## 2015-03-20 DIAGNOSIS — D631 Anemia in chronic kidney disease: Secondary | ICD-10-CM | POA: Diagnosis not present

## 2015-03-20 MED ORDER — EPOETIN ALFA 10000 UNIT/ML IJ SOLN: 10000.0000 [IU] | INTRAMUSCULAR | Status: DC

## 2015-03-20 NOTE — Progress Notes (Signed)
Patient did not take BP meds prior to appointment.  BP elevated beyond parameters and she chose not to take clonidine, but rather to reschedule appointment. Patient advised to take BP meds prior to coming for procrit injection

## 2015-03-23 DIAGNOSIS — I10 Essential (primary) hypertension: Secondary | ICD-10-CM | POA: Diagnosis not present

## 2015-03-23 DIAGNOSIS — E119 Type 2 diabetes mellitus without complications: Secondary | ICD-10-CM | POA: Diagnosis not present

## 2015-03-25 DIAGNOSIS — D649 Anemia, unspecified: Secondary | ICD-10-CM | POA: Diagnosis not present

## 2015-03-25 DIAGNOSIS — I10 Essential (primary) hypertension: Secondary | ICD-10-CM | POA: Diagnosis not present

## 2015-03-25 DIAGNOSIS — E785 Hyperlipidemia, unspecified: Secondary | ICD-10-CM | POA: Diagnosis not present

## 2015-03-25 DIAGNOSIS — I209 Angina pectoris, unspecified: Secondary | ICD-10-CM | POA: Diagnosis not present

## 2015-03-25 DIAGNOSIS — N189 Chronic kidney disease, unspecified: Secondary | ICD-10-CM | POA: Diagnosis not present

## 2015-03-25 DIAGNOSIS — I252 Old myocardial infarction: Secondary | ICD-10-CM | POA: Diagnosis not present

## 2015-03-25 DIAGNOSIS — E119 Type 2 diabetes mellitus without complications: Secondary | ICD-10-CM | POA: Diagnosis not present

## 2015-03-25 DIAGNOSIS — I2510 Atherosclerotic heart disease of native coronary artery without angina pectoris: Secondary | ICD-10-CM | POA: Diagnosis not present

## 2015-03-27 ENCOUNTER — Encounter (HOSPITAL_COMMUNITY)
Admission: RE | Admit: 2015-03-27 | Discharge: 2015-03-27 | Disposition: A | Discharge status: Home / Self Care | Payer: Medicare Other | Source: Ambulatory Visit | Attending: Nephrology | Admitting: Nephrology

## 2015-03-27 DIAGNOSIS — D631 Anemia in chronic kidney disease: Secondary | ICD-10-CM | POA: Insufficient documentation

## 2015-03-27 DIAGNOSIS — N183 Chronic kidney disease, stage 3 (moderate): Secondary | ICD-10-CM | POA: Diagnosis not present

## 2015-03-27 LAB — POCT HEMOGLOBIN-HEMACUE: Hemoglobin: 10.9 g/dL — ABNORMAL LOW (ref 12.0–15.0)

## 2015-03-27 MED ORDER — EPOETIN ALFA 10000 UNIT/ML IJ SOLN
10000.0000 [IU] | INTRAMUSCULAR | Status: DC
  Administered 2015-03-27: 10000 [IU] via SUBCUTANEOUS

## 2015-03-27 MED ORDER — EPOETIN ALFA 10000 UNIT/ML IJ SOLN
INTRAMUSCULAR | Status: AC
  Filled 2015-03-27: qty 1

## 2015-04-01 ENCOUNTER — Ambulatory Visit: Payer: Medicare Other | Admitting: Internal Medicine

## 2015-04-01 DIAGNOSIS — R4689 Other symptoms and signs involving appearance and behavior: Secondary | ICD-10-CM | POA: Insufficient documentation

## 2015-04-10 ENCOUNTER — Other Ambulatory Visit: Payer: Self-pay | Admitting: Internal Medicine

## 2015-04-13 ENCOUNTER — Encounter: Payer: Self-pay | Admitting: Internal Medicine

## 2015-04-24 ENCOUNTER — Encounter (HOSPITAL_COMMUNITY)
Admission: RE | Admit: 2015-04-24 | Discharge: 2015-04-24 | Disposition: A | Discharge status: Home / Self Care | Payer: Medicare Other | Source: Ambulatory Visit | Attending: Nephrology | Admitting: Nephrology

## 2015-04-24 DIAGNOSIS — D631 Anemia in chronic kidney disease: Secondary | ICD-10-CM | POA: Diagnosis not present

## 2015-04-24 LAB — IRON AND TIBC
Iron: 34 ug/dL (ref 28–170)
SATURATION RATIOS: 12 % (ref 10.4–31.8)
TIBC: 276 ug/dL (ref 250–450)
UIBC: 242 ug/dL

## 2015-04-24 LAB — POCT HEMOGLOBIN-HEMACUE: Hemoglobin: 10.8 g/dL — ABNORMAL LOW (ref 12.0–15.0)

## 2015-04-24 LAB — FERRITIN: Ferritin: 120 ng/mL (ref 11–307)

## 2015-04-24 MED ORDER — EPOETIN ALFA 10000 UNIT/ML IJ SOLN
10000.0000 [IU] | INTRAMUSCULAR | Status: DC
  Administered 2015-04-24: 10000 [IU] via SUBCUTANEOUS

## 2015-04-24 MED ORDER — EPOETIN ALFA 10000 UNIT/ML IJ SOLN
INTRAMUSCULAR | Status: AC
  Filled 2015-04-24: qty 1

## 2015-05-04 ENCOUNTER — Other Ambulatory Visit: Payer: Self-pay | Admitting: Internal Medicine

## 2015-05-05 NOTE — Telephone Encounter (Signed)
Faxed script back to physician pharmacy...Johny Chess

## 2015-05-07 ENCOUNTER — Other Ambulatory Visit: Payer: Self-pay | Admitting: Internal Medicine

## 2015-05-22 ENCOUNTER — Encounter (HOSPITAL_COMMUNITY)
Admission: RE | Admit: 2015-05-22 | Discharge: 2015-05-22 | Disposition: A | Discharge status: Home / Self Care | Payer: Medicare Other | Source: Ambulatory Visit | Attending: Nephrology | Admitting: Nephrology

## 2015-05-22 DIAGNOSIS — N183 Chronic kidney disease, stage 3 (moderate): Secondary | ICD-10-CM | POA: Insufficient documentation

## 2015-05-22 DIAGNOSIS — D631 Anemia in chronic kidney disease: Secondary | ICD-10-CM | POA: Diagnosis not present

## 2015-05-22 LAB — POCT HEMOGLOBIN-HEMACUE: HEMOGLOBIN: 11.3 g/dL — AB (ref 12.0–15.0)

## 2015-05-22 MED ORDER — EPOETIN ALFA 10000 UNIT/ML IJ SOLN
10000.0000 [IU] | INTRAMUSCULAR | Status: DC
  Administered 2015-05-22: 10000 [IU] via SUBCUTANEOUS

## 2015-05-22 MED ORDER — EPOETIN ALFA 10000 UNIT/ML IJ SOLN
INTRAMUSCULAR | Status: AC
  Filled 2015-05-22: qty 1

## 2015-05-26 ENCOUNTER — Emergency Department (HOSPITAL_COMMUNITY): Payer: Medicare Other

## 2015-05-26 ENCOUNTER — Encounter (HOSPITAL_COMMUNITY): Payer: Self-pay | Admitting: Emergency Medicine

## 2015-05-26 ENCOUNTER — Inpatient Hospital Stay (HOSPITAL_COMMUNITY)
Admission: EM | Admit: 2015-05-26 | Discharge: 2015-05-28 | DRG: 637 | Disposition: A | Discharge status: Home Health | Payer: Medicare Other | Attending: Internal Medicine | Admitting: Internal Medicine

## 2015-05-26 DIAGNOSIS — M79604 Pain in right leg: Secondary | ICD-10-CM | POA: Diagnosis not present

## 2015-05-26 DIAGNOSIS — I10 Essential (primary) hypertension: Secondary | ICD-10-CM | POA: Diagnosis not present

## 2015-05-26 DIAGNOSIS — E1100 Type 2 diabetes mellitus with hyperosmolarity without nonketotic hyperglycemic-hyperosmolar coma (NKHHC): Secondary | ICD-10-CM

## 2015-05-26 DIAGNOSIS — Z961 Presence of intraocular lens: Secondary | ICD-10-CM | POA: Diagnosis present

## 2015-05-26 DIAGNOSIS — E871 Hypo-osmolality and hyponatremia: Secondary | ICD-10-CM | POA: Diagnosis present

## 2015-05-26 DIAGNOSIS — D631 Anemia in chronic kidney disease: Secondary | ICD-10-CM | POA: Diagnosis present

## 2015-05-26 DIAGNOSIS — Z79899 Other long term (current) drug therapy: Secondary | ICD-10-CM

## 2015-05-26 DIAGNOSIS — E0800 Diabetes mellitus due to underlying condition with hyperosmolarity without nonketotic hyperglycemic-hyperosmolar coma (NKHHC): Secondary | ICD-10-CM | POA: Diagnosis not present

## 2015-05-26 DIAGNOSIS — Z8249 Family history of ischemic heart disease and other diseases of the circulatory system: Secondary | ICD-10-CM | POA: Diagnosis not present

## 2015-05-26 DIAGNOSIS — F329 Major depressive disorder, single episode, unspecified: Secondary | ICD-10-CM | POA: Diagnosis present

## 2015-05-26 DIAGNOSIS — W109XXA Fall (on) (from) unspecified stairs and steps, initial encounter: Secondary | ICD-10-CM | POA: Diagnosis present

## 2015-05-26 DIAGNOSIS — Z888 Allergy status to other drugs, medicaments and biological substances status: Secondary | ICD-10-CM | POA: Diagnosis not present

## 2015-05-26 DIAGNOSIS — Z9841 Cataract extraction status, right eye: Secondary | ICD-10-CM | POA: Diagnosis not present

## 2015-05-26 DIAGNOSIS — F0391 Unspecified dementia with behavioral disturbance: Secondary | ICD-10-CM | POA: Diagnosis not present

## 2015-05-26 DIAGNOSIS — N179 Acute kidney failure, unspecified: Secondary | ICD-10-CM | POA: Diagnosis not present

## 2015-05-26 DIAGNOSIS — F0390 Unspecified dementia without behavioral disturbance: Secondary | ICD-10-CM | POA: Diagnosis present

## 2015-05-26 DIAGNOSIS — E1121 Type 2 diabetes mellitus with diabetic nephropathy: Secondary | ICD-10-CM | POA: Diagnosis present

## 2015-05-26 DIAGNOSIS — I2510 Atherosclerotic heart disease of native coronary artery without angina pectoris: Secondary | ICD-10-CM | POA: Diagnosis present

## 2015-05-26 DIAGNOSIS — Y92009 Unspecified place in unspecified non-institutional (private) residence as the place of occurrence of the external cause: Secondary | ICD-10-CM | POA: Diagnosis not present

## 2015-05-26 DIAGNOSIS — I739 Peripheral vascular disease, unspecified: Secondary | ICD-10-CM | POA: Diagnosis present

## 2015-05-26 DIAGNOSIS — E7800 Pure hypercholesterolemia, unspecified: Secondary | ICD-10-CM | POA: Diagnosis present

## 2015-05-26 DIAGNOSIS — E1165 Type 2 diabetes mellitus with hyperglycemia: Principal | ICD-10-CM | POA: Diagnosis present

## 2015-05-26 DIAGNOSIS — E785 Hyperlipidemia, unspecified: Secondary | ICD-10-CM | POA: Diagnosis present

## 2015-05-26 DIAGNOSIS — E1122 Type 2 diabetes mellitus with diabetic chronic kidney disease: Secondary | ICD-10-CM | POA: Diagnosis present

## 2015-05-26 DIAGNOSIS — F32A Depression, unspecified: Secondary | ICD-10-CM | POA: Diagnosis present

## 2015-05-26 DIAGNOSIS — Z853 Personal history of malignant neoplasm of breast: Secondary | ICD-10-CM

## 2015-05-26 DIAGNOSIS — L859 Epidermal thickening, unspecified: Secondary | ICD-10-CM | POA: Diagnosis present

## 2015-05-26 DIAGNOSIS — M79652 Pain in left thigh: Secondary | ICD-10-CM | POA: Diagnosis not present

## 2015-05-26 DIAGNOSIS — Z955 Presence of coronary angioplasty implant and graft: Secondary | ICD-10-CM | POA: Diagnosis not present

## 2015-05-26 DIAGNOSIS — Z794 Long term (current) use of insulin: Secondary | ICD-10-CM

## 2015-05-26 DIAGNOSIS — N184 Chronic kidney disease, stage 4 (severe): Secondary | ICD-10-CM | POA: Diagnosis not present

## 2015-05-26 DIAGNOSIS — E872 Acidosis: Secondary | ICD-10-CM | POA: Diagnosis present

## 2015-05-26 DIAGNOSIS — Z951 Presence of aortocoronary bypass graft: Secondary | ICD-10-CM | POA: Diagnosis not present

## 2015-05-26 DIAGNOSIS — R7309 Other abnormal glucose: Secondary | ICD-10-CM | POA: Diagnosis not present

## 2015-05-26 DIAGNOSIS — M25561 Pain in right knee: Secondary | ICD-10-CM | POA: Diagnosis not present

## 2015-05-26 DIAGNOSIS — R739 Hyperglycemia, unspecified: Secondary | ICD-10-CM | POA: Diagnosis not present

## 2015-05-26 DIAGNOSIS — Z79891 Long term (current) use of opiate analgesic: Secondary | ICD-10-CM | POA: Diagnosis not present

## 2015-05-26 DIAGNOSIS — Z7982 Long term (current) use of aspirin: Secondary | ICD-10-CM | POA: Diagnosis not present

## 2015-05-26 DIAGNOSIS — D649 Anemia, unspecified: Secondary | ICD-10-CM | POA: Diagnosis present

## 2015-05-26 DIAGNOSIS — I252 Old myocardial infarction: Secondary | ICD-10-CM | POA: Diagnosis not present

## 2015-05-26 DIAGNOSIS — Z9842 Cataract extraction status, left eye: Secondary | ICD-10-CM

## 2015-05-26 DIAGNOSIS — Z87891 Personal history of nicotine dependence: Secondary | ICD-10-CM | POA: Diagnosis not present

## 2015-05-26 DIAGNOSIS — I129 Hypertensive chronic kidney disease with stage 1 through stage 4 chronic kidney disease, or unspecified chronic kidney disease: Secondary | ICD-10-CM | POA: Diagnosis present

## 2015-05-26 DIAGNOSIS — M25551 Pain in right hip: Secondary | ICD-10-CM | POA: Diagnosis not present

## 2015-05-26 DIAGNOSIS — Z7902 Long term (current) use of antithrombotics/antiplatelets: Secondary | ICD-10-CM | POA: Diagnosis not present

## 2015-05-26 LAB — COMPREHENSIVE METABOLIC PANEL
ALBUMIN: 3.4 g/dL — AB (ref 3.5–5.0)
ALK PHOS: 213 U/L — AB (ref 38–126)
ALT: 10 U/L — AB (ref 14–54)
ANION GAP: 11 (ref 5–15)
AST: 17 U/L (ref 15–41)
BILIRUBIN TOTAL: 0.6 mg/dL (ref 0.3–1.2)
BUN: 53 mg/dL — AB (ref 6–20)
CALCIUM: 8.5 mg/dL — AB (ref 8.9–10.3)
CO2: 23 mmol/L (ref 22–32)
CREATININE: 3.45 mg/dL — AB (ref 0.44–1.00)
Chloride: 96 mmol/L — ABNORMAL LOW (ref 101–111)
GFR calc Af Amer: 13 mL/min — ABNORMAL LOW (ref >60)
GFR calc non Af Amer: 11 mL/min — ABNORMAL LOW (ref >60)
GLUCOSE: 587 mg/dL — AB (ref 65–99)
Potassium: 4.8 mmol/L (ref 3.5–5.1)
SODIUM: 130 mmol/L — AB (ref 135–145)
TOTAL PROTEIN: 7.8 g/dL (ref 6.5–8.1)

## 2015-05-26 LAB — CBC WITH DIFFERENTIAL/PLATELET
BASOS ABS: 0 10*3/uL (ref 0.0–0.1)
BASOS PCT: 0 %
EOS ABS: 0.1 10*3/uL (ref 0.0–0.7)
EOS PCT: 2 %
HCT: 35.2 % — ABNORMAL LOW (ref 36.0–46.0)
Hemoglobin: 11.7 g/dL — ABNORMAL LOW (ref 12.0–15.0)
Lymphocytes Relative: 21 %
Lymphs Abs: 1.6 10*3/uL (ref 0.7–4.0)
MCH: 30.5 pg (ref 26.0–34.0)
MCHC: 33.2 g/dL (ref 30.0–36.0)
MCV: 91.7 fL (ref 78.0–100.0)
MONO ABS: 0.5 10*3/uL (ref 0.1–1.0)
Monocytes Relative: 7 %
Neutro Abs: 5.4 10*3/uL (ref 1.7–7.7)
Neutrophils Relative %: 70 %
PLATELETS: 184 10*3/uL (ref 150–400)
RBC: 3.84 MIL/uL — ABNORMAL LOW (ref 3.87–5.11)
RDW: 13 % (ref 11.5–15.5)
WBC: 7.6 10*3/uL (ref 4.0–10.5)

## 2015-05-26 LAB — CBG MONITORING, ED
GLUCOSE-CAPILLARY: 385 mg/dL — AB (ref 65–99)
Glucose-Capillary: 527 mg/dL — ABNORMAL HIGH (ref 65–99)
Glucose-Capillary: 463 mg/dL — ABNORMAL HIGH (ref 65–99)

## 2015-05-26 LAB — URINALYSIS, ROUTINE W REFLEX MICROSCOPIC
Bilirubin Urine: NEGATIVE
Hgb urine dipstick: NEGATIVE
Ketones, ur: NEGATIVE mg/dL
LEUKOCYTES UA: NEGATIVE
NITRITE: NEGATIVE
PH: 5.5 (ref 5.0–8.0)
Protein, ur: NEGATIVE mg/dL
SPECIFIC GRAVITY, URINE: 1.012 (ref 1.005–1.030)
Urobilinogen, UA: 0.2 mg/dL (ref 0.0–1.0)

## 2015-05-26 LAB — URINE MICROSCOPIC-ADD ON

## 2015-05-26 LAB — GLUCOSE, CAPILLARY: Glucose-Capillary: 346 mg/dL — ABNORMAL HIGH (ref 65–99)

## 2015-05-26 MED ORDER — INSULIN DETEMIR 100 UNIT/ML ~~LOC~~ SOLN
26.0000 [IU] | Freq: Every day | SUBCUTANEOUS | Status: DC
  Administered 2015-05-27 – 2015-05-28 (×2): 26 [IU] via SUBCUTANEOUS
  Filled 2015-05-26 (×2): qty 0.26

## 2015-05-26 MED ORDER — SODIUM CHLORIDE 0.9 % IV SOLN
INTRAVENOUS | Status: DC
  Filled 2015-05-26: qty 2.5

## 2015-05-26 MED ORDER — ONDANSETRON HCL 4 MG/2ML IJ SOLN: 4.0000 mg | Freq: Four times a day (QID) | INTRAMUSCULAR | Status: DC | PRN

## 2015-05-26 MED ORDER — LOSARTAN POTASSIUM 50 MG PO TABS
50.0000 mg | ORAL_TABLET | Freq: Every day | ORAL | Status: DC
  Administered 2015-05-27: 50 mg via ORAL
  Filled 2015-05-26: qty 1

## 2015-05-26 MED ORDER — CALCIUM CARBONATE-VITAMIN D 500-200 MG-UNIT PO TABS
1.0000 | ORAL_TABLET | Freq: Two times a day (BID) | ORAL | Status: DC
  Administered 2015-05-26 – 2015-05-28 (×4): 1 via ORAL
  Filled 2015-05-26 (×4): qty 1

## 2015-05-26 MED ORDER — FERROUS SULFATE 325 (65 FE) MG PO TABS
325.0000 mg | ORAL_TABLET | Freq: Every day | ORAL | Status: DC
  Administered 2015-05-27 – 2015-05-28 (×2): 325 mg via ORAL
  Filled 2015-05-26 (×2): qty 1

## 2015-05-26 MED ORDER — ASPIRIN EC 81 MG PO TBEC
81.0000 mg | DELAYED_RELEASE_TABLET | Freq: Every day | ORAL | Status: DC
  Administered 2015-05-26 – 2015-05-28 (×3): 81 mg via ORAL
  Filled 2015-05-26 (×3): qty 1

## 2015-05-26 MED ORDER — ACETAMINOPHEN 500 MG PO TABS: 1000.0000 mg | ORAL_TABLET | Freq: Four times a day (QID) | ORAL | Status: DC | PRN

## 2015-05-26 MED ORDER — INSULIN DETEMIR 100 UNIT/ML ~~LOC~~ SOLN: 22.0000 [IU] | Freq: Every day | SUBCUTANEOUS | Status: DC

## 2015-05-26 MED ORDER — PAROXETINE HCL 10 MG PO TABS
10.0000 mg | ORAL_TABLET | Freq: Every day | ORAL | Status: DC
  Administered 2015-05-26 – 2015-05-27 (×2): 10 mg via ORAL
  Filled 2015-05-26 (×2): qty 1

## 2015-05-26 MED ORDER — PANTOPRAZOLE SODIUM 40 MG PO TBEC
40.0000 mg | DELAYED_RELEASE_TABLET | Freq: Every day | ORAL | Status: DC
  Administered 2015-05-27 – 2015-05-28 (×2): 40 mg via ORAL
  Filled 2015-05-26 (×4): qty 1

## 2015-05-26 MED ORDER — INSULIN ASPART 100 UNIT/ML ~~LOC~~ SOLN
10.0000 [IU] | Freq: Once | SUBCUTANEOUS | Status: AC
  Administered 2015-05-26: 10 [IU] via SUBCUTANEOUS
  Filled 2015-05-26: qty 1

## 2015-05-26 MED ORDER — ACETAMINOPHEN 500 MG PO TABS: 500.0000 mg | ORAL_TABLET | Freq: Four times a day (QID) | ORAL | Status: DC | PRN

## 2015-05-26 MED ORDER — PREGABALIN 75 MG PO CAPS
75.0000 mg | ORAL_CAPSULE | Freq: Two times a day (BID) | ORAL | Status: DC
  Administered 2015-05-26 – 2015-05-28 (×4): 75 mg via ORAL
  Filled 2015-05-26 (×4): qty 1

## 2015-05-26 MED ORDER — CLOPIDOGREL BISULFATE 75 MG PO TABS
75.0000 mg | ORAL_TABLET | Freq: Every day | ORAL | Status: DC
  Administered 2015-05-27 – 2015-05-28 (×2): 75 mg via ORAL
  Filled 2015-05-26 (×2): qty 1

## 2015-05-26 MED ORDER — ONDANSETRON HCL 4 MG PO TABS: 4.0000 mg | ORAL_TABLET | Freq: Four times a day (QID) | ORAL | Status: DC | PRN

## 2015-05-26 MED ORDER — FUROSEMIDE 40 MG PO TABS
40.0000 mg | ORAL_TABLET | Freq: Two times a day (BID) | ORAL | Status: DC
  Administered 2015-05-27 – 2015-05-28 (×3): 40 mg via ORAL
  Filled 2015-05-26 (×3): qty 1

## 2015-05-26 MED ORDER — SODIUM CHLORIDE 0.9 % IV BOLUS (SEPSIS)
500.0000 mL | Freq: Once | INTRAVENOUS | Status: AC
  Administered 2015-05-26: 500 mL via INTRAVENOUS

## 2015-05-26 MED ORDER — ISOSORBIDE MONONITRATE ER 60 MG PO TB24
60.0000 mg | ORAL_TABLET | Freq: Every day | ORAL | Status: DC
  Administered 2015-05-27 – 2015-05-28 (×2): 60 mg via ORAL
  Filled 2015-05-26 (×2): qty 1

## 2015-05-26 MED ORDER — FENTANYL CITRATE (PF) 100 MCG/2ML IJ SOLN
12.5000 ug | Freq: Once | INTRAMUSCULAR | Status: AC
  Administered 2015-05-26: 12.5 ug via INTRAVENOUS
  Filled 2015-05-26: qty 2

## 2015-05-26 MED ORDER — SODIUM CHLORIDE 0.9 % IV SOLN: 1000.0000 mL | INTRAVENOUS | Status: DC

## 2015-05-26 MED ORDER — TRAMADOL HCL 50 MG PO TABS
50.0000 mg | ORAL_TABLET | Freq: Four times a day (QID) | ORAL | Status: DC | PRN
  Administered 2015-05-27: 50 mg via ORAL
  Filled 2015-05-26: qty 1

## 2015-05-26 MED ORDER — ATORVASTATIN CALCIUM 20 MG PO TABS
20.0000 mg | ORAL_TABLET | Freq: Every day | ORAL | Status: DC
  Administered 2015-05-27 – 2015-05-28 (×2): 20 mg via ORAL
  Filled 2015-05-26 (×2): qty 1

## 2015-05-26 MED ORDER — DEXTROSE-NACL 5-0.45 % IV SOLN: INTRAVENOUS | Status: DC

## 2015-05-26 MED ORDER — ENOXAPARIN SODIUM 30 MG/0.3ML ~~LOC~~ SOLN
30.0000 mg | SUBCUTANEOUS | Status: DC
Start: 2015-05-26 — End: 2015-05-28
  Administered 2015-05-26 – 2015-05-27 (×2): 30 mg via SUBCUTANEOUS
  Filled 2015-05-26 (×2): qty 0.3

## 2015-05-26 MED ORDER — DONEPEZIL HCL 10 MG PO TABS
10.0000 mg | ORAL_TABLET | Freq: Every day | ORAL | Status: DC
  Administered 2015-05-26 – 2015-05-27 (×2): 10 mg via ORAL
  Filled 2015-05-26 (×2): qty 1

## 2015-05-26 MED ORDER — INSULIN ASPART 100 UNIT/ML ~~LOC~~ SOLN
0.0000 [IU] | Freq: Three times a day (TID) | SUBCUTANEOUS | Status: DC
  Administered 2015-05-27: 3 [IU] via SUBCUTANEOUS
  Administered 2015-05-27: 8 [IU] via SUBCUTANEOUS
  Administered 2015-05-27: 11 [IU] via SUBCUTANEOUS

## 2015-05-26 MED ORDER — CALCIUM CARBONATE-VITAMIN D 600-400 MG-UNIT PO TABS: 1.0000 | ORAL_TABLET | Freq: Two times a day (BID) | ORAL | Status: DC

## 2015-05-26 MED ORDER — AMLODIPINE BESYLATE 5 MG PO TABS
5.0000 mg | ORAL_TABLET | Freq: Every day | ORAL | Status: DC
  Administered 2015-05-27 – 2015-05-28 (×2): 5 mg via ORAL
  Filled 2015-05-26 (×2): qty 1

## 2015-05-26 MED ORDER — SODIUM CHLORIDE 0.45 % IV SOLN
INTRAVENOUS | Status: DC
  Administered 2015-05-26: 23:00:00 via INTRAVENOUS
  Administered 2015-05-27: 75 mL via INTRAVENOUS

## 2015-05-26 MED ORDER — INSULIN DETEMIR 100 UNIT/ML ~~LOC~~ SOLN
22.0000 [IU] | Freq: Every day | SUBCUTANEOUS | Status: DC
  Administered 2015-05-26 – 2015-05-27 (×2): 22 [IU] via SUBCUTANEOUS
  Filled 2015-05-26 (×2): qty 0.22

## 2015-05-26 MED ORDER — SODIUM CHLORIDE 0.9 % IV SOLN: INTRAVENOUS | Status: DC

## 2015-05-26 NOTE — ED Notes (Signed)
Dr. Vanita Panda informed of decrease in CBG.

## 2015-05-26 NOTE — ED Notes (Signed)
Family states "she was going after her dog and lost her balance."  Pt c/o knee pain

## 2015-05-26 NOTE — ED Notes (Addendum)
Per EMS: pt from home, had fall last night landing on right leg. Pain to right femur area, no deformity noted. Pt CBG over 500 via EMS

## 2015-05-26 NOTE — ED Provider Notes (Signed)
CSN: [id number]     Arrival date & time 05/26/15  1453 History   First MD Initiated Contact with Patient 05/26/15 1500     Chief Complaint  Patient presents with  . Fall    HPI  Patient presents after a fall that occurred yesterday.  Patient has full recall of the event, recalls tripping, while going up stairs after walking her dog. Since that time she has had pain persistently in the right leg, distal thigh, nonradiating, sore, with no ambulation since then. No medication taken for pain relief. She denies chest pain, dyspnea, headache, neck pain, confusion, disorientation, subsequent syncope. She states that she was well prior to the event.   Past Medical History  Diagnosis Date  . 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 ANEMIA   . DEPRESSION   . HYPERTENSION   . PVD   . INSOMNIA, CHRONIC   . Dementia   . DIABETES MELLITUS, TYPE II     renal, neuro - peripheral neuropathy  . Hypercholesteremia   . Chronic kidney disease (CKD), stage IV (severe) ([place])   . Anemia of chronic disease   . Angina   . Non-Q wave myocardial infarction ([place]) 10/2009    /E-chart  . Inferior MI ([place]) 2000    "quiet"  . Breast cancer ([place]) 2000    right  . Loose stools   . Neurodermatitis    Past Surgical History  Procedure Laterality Date  . Capsulectomy  12/27/2010    L eye  . Breast lumpectomy  2000    right  . Cataract extraction w/ intraocular lens  implant, bilateral    . Coronary artery bypass graft  2000    CABG X1  . Coronary angioplasty with stent placement      "twice"  . Eye surgery      Cataracts (R) eye 06/1999 & (L) eye 09/1999- [place]  . Combined hysteroscopy diagnostic / d&c  07/2004    /e-chart  . Dilation and curettage of uterus    . Cardiac surgery    . Left heart catheterization with coronary angiogram N/A 11/22/2011    Procedure: LEFT HEART CATHETERIZATION WITH CORONARY ANGIOGRAM;  Surgeon: Clent Demark, MD;  Location: [facility] CATH LAB;  Service: Cardiovascular;   Laterality: N/A;  . Left heart catheterization with coronary angiogram N/A 10/03/2012    Procedure: LEFT HEART CATHETERIZATION WITH CORONARY ANGIOGRAM;  Surgeon: Clent Demark, MD;  Location: [facility] CATH LAB;  Service: Cardiovascular;  Laterality: N/A;   Family History  Problem Relation Age of Onset  . Coronary artery disease Father    Social History  Substance Use Topics  . Smoking status: Former Smoker -- 1.00 packs/day for 20 years    Types: Cigarettes  . Smokeless tobacco: Never Used     Comment: 11/21/11 "quit smoking 30-40 years ago  . Alcohol Use: Yes     Comment: 11/21/11 "used to drink quit alot; last drink of alcohol maybe 30 years ago"   OB History    No data available     Review of Systems  Constitutional:       Per HPI, otherwise negative  HENT:       Per HPI, otherwise negative  Respiratory:       Per HPI, otherwise negative  Cardiovascular:       Per HPI, otherwise negative  Gastrointestinal: Negative for vomiting.  Endocrine:       Negative aside from HPI  Genitourinary:  Neg aside from HPI   Musculoskeletal:       Per HPI, otherwise negative  Skin: Negative for wound.  Neurological: Positive for weakness. Negative for syncope.      Allergies  Ace inhibitors  Home Medications   Prior to Admission medications   Medication Sig Start Date End Date Taking? Authorizing Provider  acetaminophen (TYLENOL) 500 MG tablet Take 1,000 mg by mouth every 6 (six) hours as needed for mild pain.   Yes Historical Provider, MD  amLODipine (NORVASC) 5 MG tablet Take 1 tablet (5 mg total) by mouth daily. 12/29/14  Yes [place] Clack, MD  aspirin 81 MG tablet Take 1 tablet (81 mg total) by mouth daily. 11/23/11  Yes Charolette Forward, MD  atorvastatin (LIPITOR) 20 MG tablet TAKE 1 TABLET BY MOUTH ONCE DAILY 05/05/15  Yes Hoyt Koch, MD  Calcium Carbonate-Vitamin D 600-400 MG-UNIT per tablet Take 1 tablet by mouth 2 (two) times daily. 05/18/12  Yes [place] Clack, MD  clopidogrel (PLAVIX) 75 MG tablet Take 1 tablet (75 mg total) by mouth daily. 12/29/14  Yes [place] Clack, MD  donepezil (ARICEPT) 10 MG tablet TAKE 1 TABLET BY MOUTH EVERY NIGHT AT BEDTIME 04/14/15  Yes [place] Clack, MD  epoetin alfa (EPOGEN,PROCRIT) 32355 UNIT/ML injection Inject 10,000 Units into the skin every 28 (twenty-eight) days.    Yes Historical Provider, MD  ferrous sulfate 325 (65 FE) MG tablet Take 325 mg by mouth daily with breakfast. 05/18/12  Yes [place] Clack, MD  furosemide (LASIX) 40 MG tablet TAKE ONE TABLET BY MOUTH TWICE DAILY 05/05/15  Yes Hoyt Koch, MD  HUMULIN R 100 UNIT/ML injection INJECT 8 UNITS SUBCUTANEOUSLY THREE TIMES A DAY (BEFORE MEALS) 04/13/15  Yes Philemon Kingdom, MD  Insulin Detemir (LEVEMIR FLEXTOUCH) 100 UNIT/ML Pen INJECT 22 UNITS SUBCUTANEOUSLY EVERY NIGHT AT BEDTIME 11/21/14  Yes Jessica U Vann, DO  Insulin Detemir (LEVEMIR FLEXTOUCH) 100 UNIT/ML Pen INJECT 26 UNITS INTO THE SKIN IN THE MORNING. **PT NEEDS APPT FOR FUTURE REFILLS**LAST REFILL** 05/08/15  Yes Philemon Kingdom, MD  Insulin Pen Needle (EASY TOUCH PEN NEEDLES) 31G X 8 MM MISC Inject 1 pen into the skin daily. 10/27/14  Yes [place] Clack, MD  Insulin Syringe-Needle U-100 (INSULIN SYRINGE 1CC/28G) 28G X 1/2" 1 ML MISC Use to check blood sugar three times daily ICD 10 E11 29 11/28/14  Yes Hendricks Limes, MD  isosorbide mononitrate (IMDUR) 60 MG 24 hr tablet Take 60 mg by mouth daily.   Yes Historical Provider, MD  losartan (COZAAR) 50 MG tablet Take 1 tablet (50 mg total) by mouth daily. 10/04/12  Yes Charolette Forward, MD  LYRICA 75 MG capsule TAKE 1 CAPSULE BY MOUTH TWICE DAILY 05/05/15  Yes Hoyt Koch, MD  pantoprazole (PROTONIX) 40 MG tablet Take 1 tablet (40 mg total) by mouth daily at 6 (six) AM. 10/04/12  Yes Charolette Forward, MD  PARoxetine (PAXIL) 10 MG tablet TAKE 1 TABLET BY MOUTH EVERY NIGHT AT BEDTIME 04/14/15  Yes [place] Clack, MD    traMADol (ULTRAM) 50 MG tablet Take 1 tablet (50 mg total) by mouth every 8 (eight) hours as needed. Patient taking differently: Take 50 mg by mouth every 8 (eight) hours as needed. For pain 12/05/13  Yes Hendricks Limes, MD  triamcinolone cream (KENALOG) 0.1 % APPLY TOPICALLY TWICE DAILY Patient taking differently: APPLY TOPICALLY TWICE DAILY as needed for itching 05/05/15  Yes Hoyt Koch, MD  BP 157/60 mmHg  Pulse 55  Temp(Src) 98 F (36.7 C) (Oral)  Resp 18  SpO2 96% Physical Exam  Constitutional: She is oriented to person, place, and time. She has a sickly appearance. No distress.  Elderly female resting, in no distress, speaking clearly.  HENT:  Head: Normocephalic and atraumatic.  Eyes: Conjunctivae and EOM are normal.  Cardiovascular: Normal rate and regular rhythm.   Pulmonary/Chest: Effort normal and breath sounds normal. No stridor. No respiratory distress.  Abdominal: She exhibits no distension.  Musculoskeletal: She exhibits no edema.       Legs: Neurological: She is alert and oriented to person, place, and time. No cranial nerve deficit.  Skin: Skin is warm and dry.  Psychiatric: She has a normal mood and affect.  Nursing note and vitals reviewed.   ED Course  Procedures (including critical care time) Labs Review Labs Reviewed  COMPREHENSIVE METABOLIC PANEL - Abnormal; Notable for the following:    Sodium 130 (*)    Chloride 96 (*)    Glucose, Bld 587 (*)    BUN 53 (*)    Creatinine, Ser 3.45 (*)    Calcium 8.5 (*)    Albumin 3.4 (*)    ALT 10 (*)    Alkaline Phosphatase 213 (*)    GFR calc non Af Amer 11 (*)    GFR calc Af Amer 13 (*)    All other components within normal limits  CBC WITH DIFFERENTIAL/PLATELET - Abnormal; Notable for the following:    RBC 3.84 (*)    Hemoglobin 11.7 (*)    HCT 35.2 (*)    All other components within normal limits  URINALYSIS, ROUTINE W REFLEX MICROSCOPIC (NOT AT [facility]) - Abnormal; Notable for the following:     Glucose, UA >1000 (*)    All other components within normal limits  CBG MONITORING, ED - Abnormal; Notable for the following:    Glucose-Capillary 527 (*)    All other components within normal limits  CBG MONITORING, ED - Abnormal; Notable for the following:    Glucose-Capillary 463 (*)    All other components within normal limits  URINE MICROSCOPIC-ADD ON  BLOOD GAS, VENOUS    Imaging Review Dg Knee Complete 4 Views Right  05/26/2015  CLINICAL DATA:  81 year old female with a history of right hip and knee pain EXAM: RIGHT KNEE - COMPLETE 4+ VIEW COMPARISON:  No prior plain film FINDINGS: No acute fracture identified. No significant soft tissue swelling. No joint effusion. Osteopenia. Vascular calcifications of the femoral popliteal system and tibial vessels. IMPRESSION: Negative for acute bony abnormality. Atherosclerosis involving the femoral popliteal and tibial vessels. Signed, Dulcy Fanny. Earleen Newport, DO [facility] Radiology Electronically Signed   By: Corrie Mckusick D.O.   On: 05/26/2015 16:05   Dg Hip Unilat With Pelvis 2-3 Views Right  05/26/2015  CLINICAL DATA:  RIGHT hip and knee pain, fell at home last night landing on RIGHT leg EXAM: DG HIP (WITH OR WITHOUT PELVIS) 2-3V RIGHT COMPARISON:  None FINDINGS: Osseous demineralization. Mild narrowing of LEFT hip joint space. Significant narrowing of RIGHT hip joint space with subchondral cyst formation, mild subchondral sclerosis and spur formation. SI joints symmetric. No acute fracture, dislocation, or bone destruction. Scattered atherosclerotic calcifications. IMPRESSION: Osseous demineralization with significant degenerative changes of RIGHT hip joint. No definite acute bony abnormalities. Electronically Signed   By: Lavonia Dana M.D.   On: 05/26/2015 16:05   I have personally reviewed and evaluated these  images and lab results as part of my medical decision-making.   Notably, the patient's  initial blood glucose was greater than 500. PH 7.3  Patient received IV fluids empirically, will receive IV insulin.   Repeat Accu-Chek 57 MDM  Elderly female presents with concern of ongoing weakness, and pain following fall that occurred yesterday. The patient is awake and alert, is found to have substantial hyperglycemia, with acidosis. Patient has no ketonuria, but does have elevated creatinine, and with her weakness, hyperglycemia, age, the patient received IV fluids, insulin, was admitted for further evaluation and management. Patient's orthopedic evaluation is reassuring, with no initial evidence for fracture, no distal loss of neurovascular status. Patient tolerated initial intervention well.   Carmin Muskrat, MD 05/26/15 [id number]

## 2015-05-26 NOTE — H&P (Signed)
[facility] History and Physical  Ski Polich SWH:[id number] DOB: Jun 13, 1929 DOA: 05/26/2015  Referring physician: Carmin Muskrat, M.D. PCP: [name], MD   Chief Complaint: Fall.  HPI: [name] is a 81 y.o. female with a past medical history of type 2 diabetes, CAD, status post CABG in 2000, MI in 2000, non-Q-wave MI in 2011, status post stent placement 2, hypertension, hyperlipidemia, anemia, depression, mild dementia, chronic kidney disease who was brought to the emergency department after having a fall this morning around 5:30 AM at her house. Per patient's daughter, she usually uses a walker to ambulate, but early this morning she wanted to take the dog outside and did not use her walker to assist her. When she was going down the steps, she lost her balance and fell on her right leg sustaining injury to her right knee and right thigh area. She denies LOC, dizziness, dyspnea, diaphoresis, chest pain, palpitations or nausea. She states that this was strictly an accident, but complains of generalized weakness, increased thirst, polyuria, but no polyphagia. Her daughter states that she missed her 26 units of subcutaneous Levemir that she was supposed to get this morning. Her blood glucose lately has been around the 200's, per patient's daughter.   While in the ER, it was noticed that the patient had an elevated blood glucose without ketosis.    Review of Systems:  Constitutional:  Positive fatigue No weight loss, night sweats, Fevers, chills.  HEENT:  No headaches, Difficulty swallowing,Tooth/dental problems,Sore throat,  No sneezing, itching, ear ache, nasal congestion, post nasal drip,  Cardio-vascular:  No chest pain, Orthopnea, PND, swelling in lower extremities, anasarca, dizziness, palpitations  GI:  Positive for 2 episodes of diarrhea yesterday which subsided after the patient's daughter gave her an Imodium tablet yesterday afternoon. No heartburn, indigestion,  abdominal pain, nausea, vomiting,  change in bowel habits, loss of appetite  Resp:  No shortness of breath with exertion or at rest. No excess mucus, no productive cough, No non-productive cough, No coughing up of blood.No change in color of mucus.No wheezing.No chest wall deformity  Skin:  Right knee and ankle area with chronic skin lesions. GU:  no dysuria, change in color of urine, no urgency or frequency. No flank pain.  Musculoskeletal:  Right knee joint pain or swelling. Right knee decreased range of motion.  No back pain.  Psych:  No change in mood or affect. No depression or anxiety. No memory loss.   Past Medical History  Diagnosis Date  . 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 ANEMIA   . DEPRESSION   . HYPERTENSION   . PVD   . INSOMNIA, CHRONIC   . Dementia   . DIABETES MELLITUS, TYPE II     renal, neuro - peripheral neuropathy  . Hypercholesteremia   . Chronic kidney disease (CKD), stage IV (severe) ([place])   . Anemia of chronic disease   . Angina   . Non-Q wave myocardial infarction ([place]) 10/2009    /E-chart  . Inferior MI ([place]) 2000    "quiet"  . Breast cancer ([place]) 2000    right  . Loose stools   . Neurodermatitis    Past Surgical History  Procedure Laterality Date  . Capsulectomy  12/27/2010    L eye  . Breast lumpectomy  2000    right  . Cataract extraction w/ intraocular lens  implant, bilateral    . Coronary artery bypass graft  2000  CABG X1  . Coronary angioplasty with stent placement      "twice"  . Eye surgery      Cataracts (R) eye 06/1999 & (L) eye 09/1999- [place]  . Combined hysteroscopy diagnostic / d&c  07/2004    /e-chart  . Dilation and curettage of uterus    . Cardiac surgery    . Left heart catheterization with coronary angiogram N/A 11/22/2011    Procedure: LEFT HEART CATHETERIZATION WITH CORONARY ANGIOGRAM;  Surgeon: Clent Demark, MD;  Location: [facility] CATH LAB;  Service: Cardiovascular;  Laterality: N/A;  . Left heart  catheterization with coronary angiogram N/A 10/03/2012    Procedure: LEFT HEART CATHETERIZATION WITH CORONARY ANGIOGRAM;  Surgeon: Clent Demark, MD;  Location[facility] CATH LAB;  Service: Cardiovascular;  Laterality: N/A;   Social History:  reports that she has quit smoking. Her smoking use included Cigarettes. She has a 20 pack-year smoking history. She has never used smokeless tobacco. She reports that she drinks alcohol. She reports that she does not use illicit drugs.  Allergies  Allergen Reactions  . Ace Inhibitors Cough    Family History  Problem Relation Age of Onset  . Coronary artery disease Father      Prior to Admission medications   Medication Sig Start Date End Date Taking? Authorizing Provider  acetaminophen (TYLENOL) 500 MG tablet Take 1,000 mg by mouth every 6 (six) hours as needed for mild pain.   Yes Historical Provider, MD  amLODipine (NORVASC) 5 MG tablet Take 1 tablet (5 mg total) by mouth daily. 12/29/14  Yes [place] Clack, MD  aspirin 81 MG tablet Take 1 tablet (81 mg total) by mouth daily. 11/23/11  Yes Charolette Forward, MD  atorvastatin (LIPITOR) 20 MG tablet TAKE 1 TABLET BY MOUTH ONCE DAILY 05/05/15  Yes Hoyt Koch, MD  Calcium Carbonate-Vitamin D 600-400 MG-UNIT per tablet Take 1 tablet by mouth 2 (two) times daily. 05/18/12  Yes [place] Clack, MD  clopidogrel (PLAVIX) 75 MG tablet Take 1 tablet (75 mg total) by mouth daily. 12/29/14  Yes [place] Clack, MD  donepezil (ARICEPT) 10 MG tablet TAKE 1 TABLET BY MOUTH EVERY NIGHT AT BEDTIME 04/14/15  Yes [place] Clack, MD  epoetin alfa (EPOGEN,PROCRIT) 82993 UNIT/ML injection Inject 10,000 Units into the skin every 28 (twenty-eight) days.    Yes Historical Provider, MD  ferrous sulfate 325 (65 FE) MG tablet Take 325 mg by mouth daily with breakfast. 05/18/12  Yes [place] Clack, MD  furosemide (LASIX) 40 MG tablet TAKE ONE TABLET BY MOUTH TWICE DAILY 05/05/15  Yes Hoyt Koch, MD  HUMULIN  R 100 UNIT/ML injection INJECT 8 UNITS SUBCUTANEOUSLY THREE TIMES A DAY (BEFORE MEALS) 04/13/15  Yes Philemon Kingdom, MD  Insulin Detemir (LEVEMIR FLEXTOUCH) 100 UNIT/ML Pen INJECT 22 UNITS SUBCUTANEOUSLY EVERY NIGHT AT BEDTIME 11/21/14  Yes Jessica U Vann, DO  Insulin Detemir (LEVEMIR FLEXTOUCH) 100 UNIT/ML Pen INJECT 26 UNITS INTO THE SKIN IN THE MORNING. **PT NEEDS APPT FOR FUTURE REFILLS**LAST REFILL** 05/08/15  Yes Philemon Kingdom, MD  Insulin Pen Needle (EASY TOUCH PEN NEEDLES) 31G X 8 MM MISC Inject 1 pen into the skin daily. 10/27/14  Yes [place] Clack, MD  Insulin Syringe-Needle U-100 (INSULIN SYRINGE 1CC/28G) 28G X 1/2" 1 ML MISC Use to check blood sugar three times daily ICD 10 E11 29 11/28/14  Yes Hendricks Limes, MD  isosorbide mononitrate (IMDUR) 60 MG 24 hr tablet Take 60 mg by mouth daily.  Yes Historical Provider, MD  losartan (COZAAR) 50 MG tablet Take 1 tablet (50 mg total) by mouth daily. 10/04/12  Yes Charolette Forward, MD  LYRICA 75 MG capsule TAKE 1 CAPSULE BY MOUTH TWICE DAILY 05/05/15  Yes Hoyt Koch, MD  pantoprazole (PROTONIX) 40 MG tablet Take 1 tablet (40 mg total) by mouth daily at 6 (six) AM. 10/04/12  Yes Charolette Forward, MD  PARoxetine (PAXIL) 10 MG tablet TAKE 1 TABLET BY MOUTH EVERY NIGHT AT BEDTIME 04/14/15  Yes [place] Clack, MD  traMADol (ULTRAM) 50 MG tablet Take 1 tablet (50 mg total) by mouth every 8 (eight) hours as needed. Patient taking differently: Take 50 mg by mouth every 8 (eight) hours as needed. For pain 12/05/13  Yes Hendricks Limes, MD  triamcinolone cream (KENALOG) 0.1 % APPLY TOPICALLY TWICE DAILY Patient taking differently: APPLY TOPICALLY TWICE DAILY as needed for itching 05/05/15  Yes Hoyt Koch, MD   Physical Exam: Filed Vitals:   05/26/15 1503 05/26/15 1717  BP: 141/66 157/60  Pulse: 60 55  Temp: 98 F (36.7 C)   TempSrc: Oral   Resp: 16 18  SpO2: 96% 96%    Wt Readings from Last 3 Encounters:  11/28/14 72.802  kg (160 lb 8 oz)  11/18/14 72.984 kg (160 lb 14.4 oz)  09/19/14 77.111 kg (170 lb)    General:  Appears calm and comfortable Eyes: PERRL, normal lids, irises & conjunctiva ENT: grossly normal hearing, lips & tongue are dry. Neck: no LAD, masses or thyromegaly Cardiovascular: RRR, no m/r/g. No LE edema. Telemetry: SR, no arrhythmias  Respiratory: CTA bilaterally, no w/r/r. Normal respiratory effort. Abdomen: soft, ntnd Skin: Hyperpigmentation and hyperkeratotic skin lesions on right knee and right ankle area. Musculoskeletal: grossly normal tone BUE/BLE Psychiatric: grossly normal mood and affect, speech fluent and appropriate Neurologic: grossly non-focal.          Labs on Admission:  Basic Metabolic Panel:  Recent Labs Lab 05/26/15 1528  NA 130*  K 4.8  CL 96*  CO2 23  GLUCOSE 587*  BUN 53*  CREATININE 3.45*  CALCIUM 8.5*   Liver Function Tests:  Recent Labs Lab 05/26/15 1528  AST 17  ALT 10*  ALKPHOS 213*  BILITOT 0.6  PROT 7.8  ALBUMIN 3.4*   CBC:  Recent Labs Lab 05/22/15 0944 05/26/15 1528  WBC  --  7.6  NEUTROABS  --  5.4  HGB 11.3* 11.7*  HCT  --  35.2*  MCV  --  91.7  PLT  --  184    CBG:  Recent Labs Lab 05/26/15 1512 05/26/15 1744  GLUCAP 527* 463*    Radiological Exams on Admission: Dg Knee Complete 4 Views Right  05/26/2015  CLINICAL DATA:  81 year old female with a history of right hip and knee pain EXAM: RIGHT KNEE - COMPLETE 4+ VIEW COMPARISON:  No prior plain film FINDINGS: No acute fracture identified. No significant soft tissue swelling. No joint effusion. Osteopenia. Vascular calcifications of the femoral popliteal system and tibial vessels. IMPRESSION: Negative for acute bony abnormality. Atherosclerosis involving the femoral popliteal and tibial vessels. Signed, Dulcy Fanny. Earleen Newport, DO [facility] Radiology Electronically Signed   By: Corrie Mckusick D.O.   On: 05/26/2015 16:05    Dg Hip Unilat With Pelvis 2-3 Views Right  05/26/2015  CLINICAL DATA:  RIGHT hip and knee pain, fell at home last night landing on RIGHT leg EXAM: DG HIP (WITH OR WITHOUT PELVIS) 2-3V  RIGHT COMPARISON:  None FINDINGS: Osseous demineralization. Mild narrowing of LEFT hip joint space. Significant narrowing of RIGHT hip joint space with subchondral cyst formation, mild subchondral sclerosis and spur formation. SI joints symmetric. No acute fracture, dislocation, or bone destruction. Scattered atherosclerotic calcifications. IMPRESSION: Osseous demineralization with significant degenerative changes of RIGHT hip joint. No definite acute bony abnormalities. Electronically Signed   By: Lavonia Dana M.D.   On: 05/26/2015 16:05      Assessment/Plan Principal Problem:   Uncontrolled diabetes mellitus with hyperosmolarity, with long-term current use of insulin.     Hyperglycemia Admit to telemetry monitoring. Continue insulin infusion and monitor CBG closely. Follow-up electrolytes, BUN and creatinine. Check hemoglobin A1c. Resume long-acting and regular insulin once blood glucose is lower.  Active Problems:   Hyperlipidemia Continue atorvastatin and monitor LFTs periodically.    ANEMIA Monitor H&H periodically. Continue monthly Epogen as per nephrology.    Depression Continue paroxetine.    Essential hypertension Continue current antihypertensive regimen. Monitor blood pressure periodically.    Dementia Continue Aricept.    CKD (chronic kidney disease) stage 4, GFR 15-29 ml/min (HCC) Most recent creatinine is 3.45, baseline creatinine is usually under 3.    Hyperkeratotic lesions Continue triamcinolone cream as needed.    Code Status: Full code. DVT Prophylaxis: Lovenox SQ. Family Communication:  Disposition Plan: Admit to a stepdown to continue insulin infusion.  Time spent: Over 70 minutes were spent during the process of this admission.  Reubin Milan [facility] Pager [phone].

## 2015-05-27 DIAGNOSIS — E0800 Diabetes mellitus due to underlying condition with hyperosmolarity without nonketotic hyperglycemic-hyperosmolar coma (NKHHC): Secondary | ICD-10-CM | POA: Diagnosis not present

## 2015-05-27 DIAGNOSIS — N179 Acute kidney failure, unspecified: Secondary | ICD-10-CM

## 2015-05-27 DIAGNOSIS — D631 Anemia in chronic kidney disease: Secondary | ICD-10-CM | POA: Diagnosis present

## 2015-05-27 DIAGNOSIS — E785 Hyperlipidemia, unspecified: Secondary | ICD-10-CM

## 2015-05-27 DIAGNOSIS — I252 Old myocardial infarction: Secondary | ICD-10-CM | POA: Diagnosis not present

## 2015-05-27 DIAGNOSIS — I739 Peripheral vascular disease, unspecified: Secondary | ICD-10-CM | POA: Diagnosis present

## 2015-05-27 DIAGNOSIS — E1100 Type 2 diabetes mellitus with hyperosmolarity without nonketotic hyperglycemic-hyperosmolar coma (NKHHC): Secondary | ICD-10-CM | POA: Diagnosis present

## 2015-05-27 DIAGNOSIS — F0391 Unspecified dementia with behavioral disturbance: Secondary | ICD-10-CM

## 2015-05-27 DIAGNOSIS — F329 Major depressive disorder, single episode, unspecified: Secondary | ICD-10-CM | POA: Diagnosis present

## 2015-05-27 DIAGNOSIS — I10 Essential (primary) hypertension: Secondary | ICD-10-CM | POA: Diagnosis not present

## 2015-05-27 DIAGNOSIS — M79604 Pain in right leg: Secondary | ICD-10-CM | POA: Diagnosis not present

## 2015-05-27 DIAGNOSIS — Y92009 Unspecified place in unspecified non-institutional (private) residence as the place of occurrence of the external cause: Secondary | ICD-10-CM | POA: Diagnosis not present

## 2015-05-27 DIAGNOSIS — E0821 Diabetes mellitus due to underlying condition with diabetic nephropathy: Secondary | ICD-10-CM

## 2015-05-27 DIAGNOSIS — R7309 Other abnormal glucose: Secondary | ICD-10-CM

## 2015-05-27 DIAGNOSIS — E872 Acidosis: Secondary | ICD-10-CM | POA: Diagnosis present

## 2015-05-27 DIAGNOSIS — W109XXA Fall (on) (from) unspecified stairs and steps, initial encounter: Secondary | ICD-10-CM | POA: Diagnosis present

## 2015-05-27 DIAGNOSIS — Z9842 Cataract extraction status, left eye: Secondary | ICD-10-CM | POA: Diagnosis not present

## 2015-05-27 DIAGNOSIS — E1165 Type 2 diabetes mellitus with hyperglycemia: Secondary | ICD-10-CM | POA: Diagnosis present

## 2015-05-27 DIAGNOSIS — N184 Chronic kidney disease, stage 4 (severe): Secondary | ICD-10-CM

## 2015-05-27 DIAGNOSIS — Z79899 Other long term (current) drug therapy: Secondary | ICD-10-CM | POA: Diagnosis not present

## 2015-05-27 DIAGNOSIS — Z7982 Long term (current) use of aspirin: Secondary | ICD-10-CM | POA: Diagnosis not present

## 2015-05-27 DIAGNOSIS — E871 Hypo-osmolality and hyponatremia: Secondary | ICD-10-CM | POA: Diagnosis present

## 2015-05-27 DIAGNOSIS — Z7902 Long term (current) use of antithrombotics/antiplatelets: Secondary | ICD-10-CM | POA: Diagnosis not present

## 2015-05-27 DIAGNOSIS — Z9841 Cataract extraction status, right eye: Secondary | ICD-10-CM | POA: Diagnosis not present

## 2015-05-27 DIAGNOSIS — Z8249 Family history of ischemic heart disease and other diseases of the circulatory system: Secondary | ICD-10-CM | POA: Diagnosis not present

## 2015-05-27 DIAGNOSIS — Z961 Presence of intraocular lens: Secondary | ICD-10-CM | POA: Diagnosis present

## 2015-05-27 DIAGNOSIS — F0390 Unspecified dementia without behavioral disturbance: Secondary | ICD-10-CM | POA: Diagnosis present

## 2015-05-27 DIAGNOSIS — E7800 Pure hypercholesterolemia, unspecified: Secondary | ICD-10-CM | POA: Diagnosis present

## 2015-05-27 DIAGNOSIS — Z794 Long term (current) use of insulin: Secondary | ICD-10-CM | POA: Diagnosis not present

## 2015-05-27 DIAGNOSIS — L859 Epidermal thickening, unspecified: Secondary | ICD-10-CM | POA: Diagnosis present

## 2015-05-27 DIAGNOSIS — R739 Hyperglycemia, unspecified: Secondary | ICD-10-CM

## 2015-05-27 DIAGNOSIS — I2510 Atherosclerotic heart disease of native coronary artery without angina pectoris: Secondary | ICD-10-CM | POA: Diagnosis present

## 2015-05-27 DIAGNOSIS — Z79891 Long term (current) use of opiate analgesic: Secondary | ICD-10-CM | POA: Diagnosis not present

## 2015-05-27 DIAGNOSIS — E1121 Type 2 diabetes mellitus with diabetic nephropathy: Secondary | ICD-10-CM | POA: Diagnosis present

## 2015-05-27 DIAGNOSIS — E1122 Type 2 diabetes mellitus with diabetic chronic kidney disease: Secondary | ICD-10-CM | POA: Diagnosis present

## 2015-05-27 DIAGNOSIS — Z87891 Personal history of nicotine dependence: Secondary | ICD-10-CM | POA: Diagnosis not present

## 2015-05-27 DIAGNOSIS — Z955 Presence of coronary angioplasty implant and graft: Secondary | ICD-10-CM | POA: Diagnosis not present

## 2015-05-27 DIAGNOSIS — I129 Hypertensive chronic kidney disease with stage 1 through stage 4 chronic kidney disease, or unspecified chronic kidney disease: Secondary | ICD-10-CM | POA: Diagnosis present

## 2015-05-27 DIAGNOSIS — Z951 Presence of aortocoronary bypass graft: Secondary | ICD-10-CM | POA: Diagnosis not present

## 2015-05-27 DIAGNOSIS — Z888 Allergy status to other drugs, medicaments and biological substances status: Secondary | ICD-10-CM | POA: Diagnosis not present

## 2015-05-27 DIAGNOSIS — Z853 Personal history of malignant neoplasm of breast: Secondary | ICD-10-CM | POA: Diagnosis not present

## 2015-05-27 LAB — CBC
HCT: 32 % — ABNORMAL LOW (ref 36.0–46.0)
Hemoglobin: 10.6 g/dL — ABNORMAL LOW (ref 12.0–15.0)
MCH: 29.9 pg (ref 26.0–34.0)
MCHC: 33.1 g/dL (ref 30.0–36.0)
MCV: 90.4 fL (ref 78.0–100.0)
PLATELETS: 181 10*3/uL (ref 150–400)
RBC: 3.54 MIL/uL — ABNORMAL LOW (ref 3.87–5.11)
RDW: 13.1 % (ref 11.5–15.5)
WBC: 7.7 10*3/uL (ref 4.0–10.5)

## 2015-05-27 LAB — COMPREHENSIVE METABOLIC PANEL
ALT: 9 U/L — AB (ref 14–54)
AST: 13 U/L — AB (ref 15–41)
Albumin: 3 g/dL — ABNORMAL LOW (ref 3.5–5.0)
Alkaline Phosphatase: 183 U/L — ABNORMAL HIGH (ref 38–126)
Anion gap: 10 (ref 5–15)
BUN: 55 mg/dL — AB (ref 6–20)
CHLORIDE: 101 mmol/L (ref 101–111)
CO2: 22 mmol/L (ref 22–32)
CREATININE: 3.3 mg/dL — AB (ref 0.44–1.00)
Calcium: 8.3 mg/dL — ABNORMAL LOW (ref 8.9–10.3)
GFR calc Af Amer: 14 mL/min — ABNORMAL LOW (ref >60)
GFR calc non Af Amer: 12 mL/min — ABNORMAL LOW (ref >60)
GLUCOSE: 363 mg/dL — AB (ref 65–99)
Potassium: 4.3 mmol/L (ref 3.5–5.1)
SODIUM: 133 mmol/L — AB (ref 135–145)
Total Bilirubin: 0.9 mg/dL (ref 0.3–1.2)
Total Protein: 7 g/dL (ref 6.5–8.1)

## 2015-05-27 LAB — PHOSPHORUS: PHOSPHORUS: 4 mg/dL (ref 2.5–4.6)

## 2015-05-27 LAB — GLUCOSE, CAPILLARY
Glucose-Capillary: 308 mg/dL — ABNORMAL HIGH (ref 65–99)
Glucose-Capillary: 168 mg/dL — ABNORMAL HIGH (ref 65–99)
Glucose-Capillary: 286 mg/dL — ABNORMAL HIGH (ref 65–99)
Glucose-Capillary: 172 mg/dL — ABNORMAL HIGH (ref 65–99)

## 2015-05-27 LAB — MAGNESIUM: Magnesium: 1.9 mg/dL (ref 1.7–2.4)

## 2015-05-27 NOTE — Care Management Note (Signed)
Case Management Note  Patient Details  Name: [name] MRN: [id number] Date of Birth: [birth year]  Subjective/Objective:   Per patient request spoke ot dtr/son on phone Alexis/Larry about PT recc-HHPT. [facility] chosen-TC rep Kristen aware of referral await HHPT, face to face order.                 Action/Plan:d/c home w/HHC.   Expected Discharge Date:   (unknown)               Expected Discharge Plan:  [place]  In-House Referral:     Discharge planning Services  CM Consult  Post Acute Care Choice:    Choice offered to:  Adult Children  DME Arranged:    DME Agency:     HH Arranged:    HH Agency:  [facility]  Status of Service:  In process, will continue to follow  Medicare Important Message Given:    Date Medicare IM Given:    Medicare IM give by:    Date Additional Medicare IM Given:    Additional Medicare Important Message give by:     If discussed at [place] of Stay Meetings, dates discussed:    Additional Comments:  Dessa Phi, RN 05/27/2015, 4:13 PM

## 2015-05-27 NOTE — Evaluation (Signed)
Physical Therapy Evaluation Patient Details Name: [name] MRN: [id number] DOB: [birth year] Today's Date: 05/27/2015   History of Present Illness  81 y.o. female with a past medical history of type 2 diabetes, CAD, status post CABG in 2000, MI in 2000, non-Q-wave MI in 2011, status post stent placement 2, hypertension, hyperlipidemia, anemia, depression, mild dementia, chronic kidney disease and presented to hospital after sustaining fall at home.  R knee xray negative for acute bony abnormality  Clinical Impression  Pt admitted with above diagnosis. Pt currently with functional limitations due to the deficits listed below (see PT Problem List).  Pt will benefit from skilled PT to increase their independence and safety with mobility to allow discharge to the venue listed below.  Pt requiring at least mod assist to stand at EOB today and unable to tolerate ambulation.  Recommend at least mod assist available at home.  If assist not available would benefit from SNF.  Pt very unsteady and presents with generalized weakness and R toe and knee pain.     Follow Up Recommendations Home health PT;Supervision/Assistance - 24 hour    Equipment Recommendations  None recommended by PT    Recommendations for Other Services       Precautions / Restrictions Precautions Precautions: Fall      Mobility  Bed Mobility Overal bed mobility: Needs Assistance Bed Mobility: Supine to Sit;Sit to Supine     Supine to sit: Mod assist;HOB elevated Sit to supine: Mod assist   General bed mobility comments: verbal cues for self assist, pt requiring more assist for trunk and scooting hips  Transfers Overall transfer level: Needs assistance Equipment used: Rolling walker (2 wheeled) Transfers: Sit to/from Stand Sit to Stand: Mod assist         General transfer comment: verbal cues for safe technique, increased assist due to weakness and pain, pt attempted to side step up [facility] however very unsteady so  assisted safely to return to sitting  Ambulation/Gait                Stairs            Wheelchair Mobility    Modified Rankin (Stroke Patients Only)       Balance Overall balance assessment: History of Falls;Needs assistance Sitting-balance support: Feet supported;Bilateral upper extremity supported Sitting balance-Leahy Scale: Poor     Standing balance support: Bilateral upper extremity supported Standing balance-Leahy Scale: Poor                               Pertinent Vitals/Pain Pain Assessment: Faces Faces Pain Scale: Hurts even more Pain Location: 1st R toe, R knee Pain Descriptors / Indicators: Sore Pain Intervention(s): Limited activity within patient's tolerance;Monitored during session;Ice applied (RN aware)    Home Living Family/patient expects to be discharged to:: Private residence Living Arrangements: Children Available Help at Discharge: Family;Personal care attendant;Available PRN/intermittently Type of Home: Apartment Home Access: Level entry     Home Layout: One level Home Equipment: Walker - 2 wheels;Cane - single point;Bedside commode      Prior Function Level of Independence: Needs assistance   Gait / Transfers Assistance Needed: typically has caregiver or family around, uses RW for ambulation           Hand Dominance        Extremity/Trunk Assessment   Upper Extremity Assessment: Generalized weakness           Lower Extremity  Assessment: Generalized weakness         Communication   Communication: No difficulties  Cognition Arousal/Alertness: Awake/alert Behavior During Therapy: WFL for tasks assessed/performed Overall Cognitive Status: History of cognitive impairments - at baseline                      General Comments      Exercises        Assessment/Plan    PT Assessment Patient needs continued PT services  PT Diagnosis Difficulty walking;Acute pain;Generalized weakness    PT Problem List Decreased strength;Decreased activity tolerance;Decreased mobility;Decreased balance;Decreased safety awareness;Pain  PT Treatment Interventions DME instruction;Gait training;Functional mobility training;Patient/family education;Therapeutic activities;Therapeutic exercise;Balance training   PT Goals (Current goals can be found in the Care Plan section) Acute Rehab PT Goals PT Goal Formulation: With patient/family Time For Goal Achievement: 06/03/15 Potential to Achieve Goals: Good    Frequency Min 3X/week   Barriers to discharge        Co-evaluation               End of Session Equipment Utilized During Treatment: Gait belt Activity Tolerance: Patient limited by pain Patient left: in bed;with call bell/phone within reach;with family/visitor present;with nursing/sitter in room;with bed alarm set Nurse Communication: Mobility status    Functional Assessment Tool Used: clinical judgement Functional Limitation: Mobility: Walking and moving around Mobility: Walking and Moving Around Current Status [phone]): At least 40 percent but less than 60 percent impaired, limited or restricted Mobility: Walking and Moving Around Goal Status [phone]): At least 1 percent but less than 20 percent impaired, limited or restricted    Time: 1435-1452 PT Time Calculation (min) (ACUTE ONLY): 17 min   Charges:   PT Evaluation $Initial PT Evaluation Tier I: 1 Procedure     PT G Codes:   PT G-Codes **NOT FOR INPATIENT CLASS** Functional Assessment Tool Used: clinical judgement Functional Limitation: Mobility: Walking and moving around Mobility: Walking and Moving Around Current Status ([id number]): At least 40 percent but less than 60 percent impaired, limited or restricted Mobility: Walking and Moving Around Goal Status [phone]): At least 1 percent but less than 20 percent impaired, limited or restricted    Nahun Kronberg,KATHrine E 05/27/2015, 3:43 PM Carmelia Bake, PT, DPT 05/27/2015 Pager:  [phone]

## 2015-05-27 NOTE — Progress Notes (Addendum)
Patient ID: [name], female   DOB: 26-Aug-1928, 81 y.o.   MRN: [id number]  [facility] PROGRESS NOTE  [name] PYK:[id number] DOB: [birth year] DOA: 05/26/2015 PCP: [name], MD   Brief narrative:    81 y.o. female with a past medical history of type 2 diabetes, CAD, status post CABG in 2000, MI in 2000, non-Q-wave MI in 2011, status post stent placement 2, hypertension, hyperlipidemia, anemia, depression, mild dementia, chronic kidney disease who was brought to the emergency department after an episode of fall at home while trying to get up the steps morning prior to the admission.   While in the ER, it was noticed that the patient had an elevated blood glucose without ketosis. [facility] asked to admit for further evaluation.   Assessment/Plan:    Principal Problem:   Diabetes mellitus with long-term current use of insulin ([place]), complications of nephropathy and neuropathy  - CBG better controlled this AM - continue insulin detemir BID - continue Lyrican - continue SSI  Active Problems:   Fall - likely mechanical - PT eval requested     Acute hyponatremia - pre renal in etiology - IVF provided and Na is trending up towards normal range  - stop IVF and monitor response     ANEMIA of chronic disease, CKD - slight drop in Hg overnight - no signs of bleeding - CBC In AM    Essential hypertension - reasonable inpatient control - continue with home medical regimen except losartan    Dementia - at baseline this AM    Acute on chronic kidney disease stage 4, GFR 15-29 ml/min (HCC) - exacerbated by hyperglycemia - IVF have been provided and Cr is trending down - stop Losartan until renal function stabilizes  - repeat BMP In AM  DVT prophylaxis - Lovenox SQ  Code Status: Full.  Family Communication:  plan of care discussed with the patient and daughter at bedside  Disposition Plan: Home in 24 hours   IV access:  Peripheral IV  Procedures and diagnostic  studies:    Dg Knee Complete 4 Views Right 05/26/2015   Negative for acute bony abnormality. Atherosclerosis involving the femoral popliteal and tibial vessels.    Dg Hip Unilat With Pelvis 2-3 Views Right 05/26/2015 Osseous demineralization with significant degenerative changes of RIGHT hip joint. No definite acute bony abnormalities.  Medical Consultants:  None   Other Consultants:  PT  IAnti-Infectives:   None  Faye Ramsay, MD  [facility] Pager [phone]  If 7PM-7AM, please contact night-coverage [email or web address] Password [email or web address] 05/27/2015, 5:03 PM   LOS: 1 day   HPI/Subjective: No events overnight.   Objective: Filed Vitals:   05/27/15 0700 05/27/15 0709 05/27/15 1100 05/27/15 1337  BP:   130/60 108/50  Pulse:   68 81  Temp:    98.9 F (37.2 C)  TempSrc:    Oral  Resp:    15  Height: 5\' 4"  (1.626 m)     Weight:  71.8 kg (158 lb 4.6 oz)    SpO2:    96%    Intake/Output Summary (Last 24 hours) at 05/27/15 1703 Last data filed at 05/27/15 1542  Gross per 24 hour  Intake 2153.75 ml  Output    600 ml  Net 1553.75 ml    Exam:   General:  Pt is alert, follows commands appropriately, not in acute distress  Cardiovascular: Regular rate and rhythm,  no rubs, no gallops  Respiratory: Clear to auscultation bilaterally, no wheezing, no crackles, no  rhonchi  Abdomen: Soft, non tender, non distended, bowel sounds present, no guarding   Data Reviewed: Basic Metabolic Panel:  Recent Labs Lab 05/26/15 1528 05/27/15 0448  NA 130* 133*  K 4.8 4.3  CL 96* 101  CO2 23 22  GLUCOSE 587* 363*  BUN 53* 55*  CREATININE 3.45* 3.30*  CALCIUM 8.5* 8.3*  MG  --  1.9  PHOS  --  4.0   Liver Function Tests:  Recent Labs Lab 05/26/15 1528 05/27/15 0448  AST 17 13*  ALT 10* 9*  ALKPHOS 213* 183*  BILITOT 0.6 0.9  PROT 7.8 7.0  ALBUMIN 3.4* 3.0*   CBC:  Recent Labs Lab 05/22/15 0944 05/26/15 1528 05/27/15 0448  WBC  --  7.6 7.7  NEUTROABS  --  5.4  --    HGB 11.3* 11.7* 10.6*  HCT  --  35.2* 32.0*  MCV  --  91.7 90.4  PLT  --  184 181   CBG:  Recent Labs Lab 05/26/15 1924 05/26/15 2058 05/27/15 0732 05/27/15 1133 05/27/15 1649  GLUCAP 385* 346* 308* 168* 286*    No results found for this or any previous visit (from the past 240 hour(s)).   Scheduled Meds: . sodium chloride   Intravenous STAT  . amLODipine  5 mg Oral Daily  . aspirin EC  81 mg Oral Daily  . atorvastatin  20 mg Oral Daily  . calcium-vitamin D  1 tablet Oral BID  . clopidogrel  75 mg Oral Daily  . donepezil  10 mg Oral QHS  . enoxaparin (LOVENOX) injection  30 mg Subcutaneous Q24H  . ferrous sulfate  325 mg Oral Q breakfast  . furosemide  40 mg Oral BID  . insulin aspart  0-15 Units Subcutaneous TID WC  . insulin detemir  22 Units Subcutaneous Q2200  . insulin detemir  26 Units Subcutaneous Daily  . isosorbide mononitrate  60 mg Oral Daily  . losartan  50 mg Oral Daily  . pantoprazole  40 mg Oral Q0600  . PARoxetine  10 mg Oral QHS  . pregabalin  75 mg Oral BID   Continuous Infusions: . dextrose 5 % and 0.45% NaCl

## 2015-05-27 NOTE — Care Management Obs Status (Signed)
[place] NOTIFICATION   Patient Details  Name: [name] MRN: [id number] Date of Birth: [birth year]   Medicare Observation Status Notification Given:  Yes    Dessa Phi, RN 05/27/2015, 10:59 AM

## 2015-05-27 NOTE — Care Management Note (Signed)
Case Management Note  Patient Details  Name: Janin Kozlowski MRN: [id number] Date of Birth: [birth year]  Subjective/Objective:  81 y/o f admitted w/Uncontrolled DM. From home w/dtr.Has private duty sitter.PT cons-await recommendations.                  Action/Plan:d/c plan home.   Expected Discharge Date:   (unknown)               Expected Discharge Plan:  Home/Self Care  In-House Referral:     Discharge planning Services  CM Consult  Post Acute Care Choice:    Choice offered to:     DME Arranged:    DME Agency:     HH Arranged:    HH Agency:     Status of Service:  In process, will continue to follow  Medicare Important Message Given:    Date Medicare IM Given:    Medicare IM give by:    Date Additional Medicare IM Given:    Additional Medicare Important Message give by:     If discussed at [place] of Stay Meetings, dates discussed:    Additional Comments:  Dessa Phi, RN 05/27/2015, 10:59 AM

## 2015-05-28 DIAGNOSIS — N183 Chronic kidney disease, stage 3 (moderate): Secondary | ICD-10-CM

## 2015-05-28 DIAGNOSIS — E0800 Diabetes mellitus due to underlying condition with hyperosmolarity without nonketotic hyperglycemic-hyperosmolar coma (NKHHC): Secondary | ICD-10-CM

## 2015-05-28 DIAGNOSIS — Z794 Long term (current) use of insulin: Secondary | ICD-10-CM

## 2015-05-28 LAB — BASIC METABOLIC PANEL
Anion gap: 10 (ref 5–15)
BUN: 56 mg/dL — ABNORMAL HIGH (ref 6–20)
CHLORIDE: 103 mmol/L (ref 101–111)
CO2: 22 mmol/L (ref 22–32)
CREATININE: 3.44 mg/dL — AB (ref 0.44–1.00)
Calcium: 8.5 mg/dL — ABNORMAL LOW (ref 8.9–10.3)
GFR calc non Af Amer: 11 mL/min — ABNORMAL LOW (ref >60)
GFR, EST AFRICAN AMERICAN: 13 mL/min — AB (ref >60)
Glucose, Bld: 70 mg/dL (ref 65–99)
POTASSIUM: 3.9 mmol/L (ref 3.5–5.1)
SODIUM: 135 mmol/L (ref 135–145)

## 2015-05-28 LAB — CBC
HEMATOCRIT: 31.3 % — AB (ref 36.0–46.0)
HEMOGLOBIN: 10.4 g/dL — AB (ref 12.0–15.0)
MCH: 30.7 pg (ref 26.0–34.0)
MCHC: 33.2 g/dL (ref 30.0–36.0)
MCV: 92.3 fL (ref 78.0–100.0)
Platelets: 182 10*3/uL (ref 150–400)
RBC: 3.39 MIL/uL — AB (ref 3.87–5.11)
RDW: 13.6 % (ref 11.5–15.5)
WBC: 6.7 10*3/uL (ref 4.0–10.5)

## 2015-05-28 LAB — GLUCOSE, CAPILLARY
GLUCOSE-CAPILLARY: 62 mg/dL — AB (ref 65–99)
GLUCOSE-CAPILLARY: 241 mg/dL — AB (ref 65–99)

## 2015-05-28 LAB — HEMOGLOBIN A1C
Hgb A1c MFr Bld: 13.8 % — ABNORMAL HIGH (ref 4.8–5.6)
Mean Plasma Glucose: 349 mg/dL

## 2015-05-28 NOTE — Discharge Summary (Signed)
Physician Discharge Summary  [name] YIR:[id number] DOB: 12-17-1928 DOA: 05/26/2015  PCP: Nichole Grant, MD  Admit date: 05/26/2015 Discharge date: 05/28/2015  Recommendations for Outpatient Follow-up:  1. Pt will need to follow up with PCP in 2-3 weeks post discharge 2. Please obtain BMP to evaluate electrolytes and kidney function 3. Please also check CBC to evaluate Hg and Hct levels 4. Please note that losartan was stopped until renal function stabilizes   Discharge Diagnoses:  Principal Problem:   Uncontrolled diabetes mellitus with hyperosmolarity, with long-term current use of insulin (HCC) Active Problems:   Diabetic nephropathies ([place])   Acute renal failure superimposed on stage 3 chronic kidney disease (HCC)   Hyperlipidemia   ANEMIA   Depression   Essential hypertension   Dementia   CKD (chronic kidney disease) stage 4, GFR 15-29 ml/min (HCC)   Hyperglycemia   AKI (acute kidney injury) ([place])  Discharge Condition: Stable  Diet recommendation: Heart healthy diet discussed in details   Brief narrative:    81 y.o. female with a past medical history of type 2 diabetes, CAD, status post CABG in 2000, MI in 2000, non-Q-wave MI in 2011, status post stent placement 2, hypertension, hyperlipidemia, anemia, depression, mild dementia, chronic kidney disease who was brought to the emergency department after an episode of fall at home while trying to get up the steps morning prior to the admission.   While in the ER, it was noticed that the patient had an elevated blood glucose without ketosis. [facility] asked to admit for further evaluation.   Assessment/Plan:    Principal Problem:  Diabetes mellitus with long-term current use of insulin ([place]), complications of nephropathy and neuropathy  - CBG better controlled this AM - continue insulin detemir BID per home medical regimen  - continue Lyrican - stable for d/c home  Active Problems:  Fall - likely mechanical -  PT eval requested, HH PT requested upon discharge    Acute hyponatremia - pre renal in etiology - IVF provided and Na is trending up towards normal range    ANEMIA of chronic disease, CKD - no signs of bleeding   Essential hypertension - reasonable inpatient control - continue with home medical regimen except losartan   Dementia - at baseline this AM   Acute on chronic kidney disease stage 4, GFR 15-29 ml/min (HCC) - exacerbated by hyperglycemia - IVF have been provided and Cr is trending down - stopped Losartan until renal function stabilizes   DVT prophylaxis - Lovenox SQ  Code Status: Full.  Family Communication: plan of care discussed with the patient and daughter at bedside  Disposition Plan: Home   IV access:  Peripheral IV  Procedures and diagnostic studies:   Dg Knee Complete 4 Views Right 05/26/2015 Negative for acute bony abnormality. Atherosclerosis involving the femoral popliteal and tibial vessels.   Dg Hip Unilat With Pelvis 2-3 Views Right 05/26/2015 Osseous demineralization with significant degenerative changes of RIGHT hip joint. No definite acute bony abnormalities.  Medical Consultants:  None   Other Consultants:  PT  IAnti-Infectives:   None       Discharge Exam: Filed Vitals:   05/28/15 0553  BP: 127/42  Pulse: 54  Temp: 97.9 F (36.6 C)  Resp: 18   Filed Vitals:   05/27/15 1100 05/27/15 1337 05/27/15 2126 05/28/15 0553  BP: 130/60 108/50 138/57 127/42  Pulse: 68 81 68 54  Temp:  98.9 F (37.2 C) 98.7 F (37.1 C) 97.9 F (36.6 C)  TempSrc:  Oral Oral Oral  Resp:  15 18 18   Height:      Weight:      SpO2:  96% 94% 97%    General: Pt is alert, follows commands appropriately, not in acute distress Cardiovascular: Regular rate and rhythm, no rubs, no gallops Respiratory: Clear to auscultation bilaterally, no wheezing, no crackles, no rhonchi Abdominal: Soft, non tender, non distended, bowel sounds +,  no guarding  Discharge Instructions     Medication List    STOP taking these medications        losartan 50 MG tablet  Commonly known as:  COZAAR      TAKE these medications        acetaminophen 500 MG tablet  Commonly known as:  TYLENOL  Take 1,000 mg by mouth every 6 (six) hours as needed for mild pain.     amLODipine 5 MG tablet  Commonly known as:  NORVASC  Take 1 tablet (5 mg total) by mouth daily.     aspirin 81 MG tablet  Take 1 tablet (81 mg total) by mouth daily.     atorvastatin 20 MG tablet  Commonly known as:  LIPITOR  TAKE 1 TABLET BY MOUTH ONCE DAILY     Calcium Carbonate-Vitamin D 600-400 MG-UNIT tablet  Take 1 tablet by mouth 2 (two) times daily.     clopidogrel 75 MG tablet  Commonly known as:  PLAVIX  Take 1 tablet (75 mg total) by mouth daily.     donepezil 10 MG tablet  Commonly known as:  ARICEPT  TAKE 1 TABLET BY MOUTH EVERY NIGHT AT BEDTIME     epoetin alfa 10000 UNIT/ML injection  Commonly known as:  EPOGEN,PROCRIT  Inject 10,000 Units into the skin every 28 (twenty-eight) days.     ferrous sulfate 325 (65 FE) MG tablet  Take 325 mg by mouth daily with breakfast.     furosemide 40 MG tablet  Commonly known as:  LASIX  TAKE ONE TABLET BY MOUTH TWICE DAILY     HUMULIN R 100 units/mL injection  Generic drug:  insulin regular  INJECT 8 UNITS SUBCUTANEOUSLY THREE TIMES A DAY (BEFORE MEALS)     Insulin Detemir 100 UNIT/ML Pen  Commonly known as:  LEVEMIR FLEXTOUCH  INJECT 22 UNITS SUBCUTANEOUSLY EVERY NIGHT AT BEDTIME     Insulin Detemir 100 UNIT/ML Pen  Commonly known as:  LEVEMIR FLEXTOUCH  INJECT 26 UNITS INTO THE SKIN IN THE MORNING. **PT NEEDS APPT FOR FUTURE REFILLS**LAST REFILL**     Insulin Pen Needle 31G X 8 MM Misc  Commonly known as:  EASY TOUCH PEN NEEDLES  Inject 1 pen into the skin daily.     INSULIN SYRINGE 1CC/28G 28G X 1/2" 1 ML Misc  Use to check blood sugar three times daily ICD 10 E11 29     isosorbide  mononitrate 60 MG 24 hr tablet  Commonly known as:  IMDUR  Take 60 mg by mouth daily.     LYRICA 75 MG capsule  Generic drug:  pregabalin  TAKE 1 CAPSULE BY MOUTH TWICE DAILY     pantoprazole 40 MG tablet  Commonly known as:  PROTONIX  Take 1 tablet (40 mg total) by mouth daily at 6 (six) AM.     PARoxetine 10 MG tablet  Commonly known as:  PAXIL  TAKE 1 TABLET BY MOUTH EVERY NIGHT AT BEDTIME     traMADol 50 MG tablet  Commonly known as:  ULTRAM  Take 1  tablet (50 mg total) by mouth every 8 (eight) hours as needed.     triamcinolone cream 0.1 %  Commonly known as:  KENALOG  APPLY TOPICALLY TWICE DAILY            Follow-up Information    Follow up with Nichole Grant, MD.   Specialty:  Internal Medicine   Contact information:   [address] [place] [address] [phone]        The results of significant diagnostics from this hospitalization (including imaging, microbiology, ancillary and laboratory) are listed below for reference.     Microbiology: No results found for this or any previous visit (from the past 240 hour(s)).   Labs: Basic Metabolic Panel:  Recent Labs Lab 05/26/15 1528 05/27/15 0448  NA 130* 133*  K 4.8 4.3  CL 96* 101  CO2 23 22  GLUCOSE 587* 363*  BUN 53* 55*  CREATININE 3.45* 3.30*  CALCIUM 8.5* 8.3*  MG  --  1.9  PHOS  --  4.0   Liver Function Tests:  Recent Labs Lab 05/26/15 1528 05/27/15 0448  AST 17 13*  ALT 10* 9*  ALKPHOS 213* 183*  BILITOT 0.6 0.9  PROT 7.8 7.0  ALBUMIN 3.4* 3.0*   No results for input(s): LIPASE, AMYLASE in the last 168 hours. No results for input(s): AMMONIA in the last 168 hours. CBC:  Recent Labs Lab 05/22/15 0944 05/26/15 1528 05/27/15 0448 05/28/15 0515  WBC  --  7.6 7.7 6.7  NEUTROABS  --  5.4  --   --   HGB 11.3* 11.7* 10.6* 10.4*  HCT  --  35.2* 32.0* 31.3*  MCV  --  91.7 90.4 92.3  PLT  --  184 181 182   CBG:  Recent Labs Lab  05/26/15 2058 05/27/15 0732 05/27/15 1133 05/27/15 1649 05/27/15 2143  GLUCAP 346* 308* 168* 286* 172*     SIGNED: Time coordinating discharge: 30 minutes  MAGICK-Askia Hazelip, MD  [facility] 05/28/2015, 7:01 AM Pager [phone]  If 7PM-7AM, please contact night-coverage [email or web address] Password [email or web address]

## 2015-05-28 NOTE — Progress Notes (Signed)
Discharge instruction reviewed with patient and daughter. Questions, concerns denied. Patient is A&Ox4, ambulatory with assist.

## 2015-05-28 NOTE — Discharge Instructions (Signed)
Acute Kidney Injury Acute kidney injury is any condition in which there is sudden (acute) damage to the kidneys. Acute kidney injury was previously known as acute kidney failure or acute renal failure. The kidneys are two organs that lie on either side of the spine between the middle of the back and the front of the abdomen. The kidneys:  Remove wastes and extra water from the blood.   Produce important hormones. These help keep bones strong, regulate blood pressure, and help create red blood cells.   Balance the fluids and chemicals in the blood and tissues. A small amount of kidney damage may not cause problems, but a large amount of damage may make it difficult or impossible for the kidneys to work the way they should. Acute kidney injury may develop into long-lasting (chronic) kidney disease. It may also develop into a life-threatening disease called end-stage kidney disease. Acute kidney injury can get worse very quickly, so it should be treated right away. Early treatment may prevent other kidney diseases from developing. CAUSES   A problem with blood flow to the kidneys. This may be caused by:   Blood loss.   Heart disease.   Severe burns.   Liver disease.  Direct damage to the kidneys. This may be caused by:  Some medicines.   A kidney infection.   Poisoning or consuming toxic substances.   A surgical wound.   A blow to the kidney area.   A problem with urine flow. This may be caused by:   Cancer.   Kidney stones.   An enlarged prostate. SIGNS AND SYMPTOMS   Swelling (edema) of the legs, ankles, or feet.   Tiredness (lethargy).   Nausea or vomiting.   Confusion.   Problems with urination, such as:   Painful or burning feeling during urination.   Decreased urine production.   Frequent accidents in children who are potty trained.   Bloody urine.   Muscle twitches and cramps.   Shortness of breath.   Seizures.   Chest  pain or pressure. Sometimes, no symptoms are present. DIAGNOSIS Acute kidney injury may be detected and diagnosed by tests, including blood, urine, imaging, or kidney biopsy tests.  TREATMENT Treatment of acute kidney injury varies depending on the cause and severity of the kidney damage. In mild cases, no treatment may be needed. The kidneys may heal on their own. If acute kidney injury is more severe, your health care provider will treat the cause of the kidney damage, help the kidneys heal, and prevent complications from occurring. Severe cases may require a procedure to remove toxic wastes from the body (dialysis) or surgery to repair kidney damage. Surgery may involve:   Repair of a torn kidney.   Removal of an obstruction. HOME CARE INSTRUCTIONS  Follow your prescribed diet.  Take medicines only as directed by your health care provider.  Do not take any new medicines (prescription, over-the-counter, or nutritional supplements) unless approved by your health care provider. Many medicines can worsen your kidney damage or may need to have the dose adjusted.   Keep all follow-up visits as directed by your health care provider. This is important.  Observe your condition to make sure you are healing as expected. SEEK IMMEDIATE MEDICAL CARE IF:  You are feeling ill or have severe pain in the back or side.   Your symptoms return or you have new symptoms.  You have any symptoms of end-stage kidney disease. These include:   Persistent itchiness.  Loss of appetite.   Headaches.   Abnormally dark or light skin.  Numbness in the hands or feet.   Easy bruising.   Frequent hiccups.   Menstruation stops.   You have a fever.  You have increased urine production.  You have pain or bleeding when urinating. MAKE SURE YOU:   Understand these instructions.  Will watch your condition.  Will get help right away if you are not doing well or get worse.   This  information is not intended to replace advice given to you by your health care provider. Make sure you discuss any questions you have with your health care provider.   Document Released: 01/24/2011 Document Revised: 08/01/2014 Document Reviewed: 03/09/2012 Elsevier Interactive Patient Education 2016 Elsevier Inc.  Blood Glucose Monitoring, Adult Monitoring your blood glucose (also know as blood sugar) helps you to manage your diabetes. It also helps you and your health care provider monitor your diabetes and determine how well your treatment plan is working. WHY SHOULD YOU MONITOR YOUR BLOOD GLUCOSE?  It can help you understand how food, exercise, and medicine affect your blood glucose.  It allows you to know what your blood glucose is at any given moment. You can quickly tell if you are having low blood glucose (hypoglycemia) or high blood glucose (hyperglycemia).  It can help you and your health care provider know how to adjust your medicines.  It can help you understand how to manage an illness or adjust medicine for exercise. WHEN SHOULD YOU TEST? Your health care provider will help you decide how often you should check your blood glucose. This may depend on the type of diabetes you have, your diabetes control, or the types of medicines you are taking. Be sure to write down all of your blood glucose readings so that this information can be reviewed with your health care provider. See below for examples of testing times that your health care provider may suggest. Type 1 Diabetes  Test at least 2 times per day if your diabetes is well controlled, if you are using an insulin pump, or if you perform multiple daily injections.  If your diabetes is not well controlled or if you are sick, you may need to test more often.  It is a good idea to also test:  Before every insulin injection.  Before and after exercise.  Between meals and 2 hours after a meal.  Occasionally between 2:00 a.m.  and 3:00 a.m. Type 2 Diabetes  If you are taking insulin, test at least 2 times per day. However, it is best to test before every insulin injection.  If you take medicines by mouth (orally), test 2 times a day.  If you are on a controlled diet, test once a day.  If your diabetes is not well controlled or if you are sick, you may need to monitor more often. HOW TO MONITOR YOUR BLOOD GLUCOSE Supplies Needed  Blood glucose meter.  Test strips for your meter. Each meter has its own strips. You must use the strips that go with your own meter.  A pricking needle (lancet).  A device that holds the lancet (lancing device).  A journal or log book to write down your results. Procedure  Wash your hands with soap and water. Alcohol is not preferred.  Prick the side of your finger (not the tip) with the lancet.  Gently milk the finger until a small drop of blood appears.  Follow the instructions that come with your  meter for inserting the test strip, applying blood to the strip, and using your blood glucose meter. Other Areas to Get Blood for Testing Some meters allow you to use other areas of your body (other than your finger) to test your blood. These areas are called alternative sites. The most common alternative sites are:  The forearm.  The thigh.  The back area of the lower leg.  The palm of the hand. The blood flow in these areas is slower. Therefore, the blood glucose values you get may be delayed, and the numbers are different from what you would get from your fingers. Do not use alternative sites if you think you are having hypoglycemia. Your reading will not be accurate. Always use a finger if you are having hypoglycemia. Also, if you cannot feel your lows (hypoglycemia unawareness), always use your fingers for your blood glucose checks. ADDITIONAL TIPS FOR GLUCOSE MONITORING  Do not reuse lancets.  Always carry your supplies with you.  All blood glucose meters have a  24-hour "hotline" number to call if you have questions or need help.  Adjust (calibrate) your blood glucose meter with a control solution after finishing a few boxes of strips. BLOOD GLUCOSE RECORD KEEPING It is a good idea to keep a daily record or log of your blood glucose readings. Most glucose meters, if not all, keep your glucose records stored in the meter. Some meters come with the ability to download your records to your home computer. Keeping a record of your blood glucose readings is especially helpful if you are wanting to look for patterns. Make notes to go along with the blood glucose readings because you might forget what happened at that exact time. Keeping good records helps you and your health care provider to work together to achieve good diabetes management.    This information is not intended to replace advice given to you by your health care provider. Make sure you discuss any questions you have with your health care provider.   Document Released: 07/14/2003 Document Revised: 08/01/2014 Document Reviewed: 12/03/2012 Elsevier Interactive Patient Education [employer].

## 2015-05-28 NOTE — Care Management Note (Signed)
Case Management Note  Patient Details  Name: Dontavia Brand MRN: [id number] Date of Birth: 08-18-1928  Subjective/Objective:  [facility] rep Kristen awareof HHPTordered, & d/c.                  Action/Plan:d/c home w/HHC.   Expected Discharge Date:   (unknown)               Expected Discharge Plan:  [place]  In-House Referral:     Discharge planning Services  CM Consult  Post Acute Care Choice:    Choice offered to:  Adult Children  DME Arranged:    DME Agency:     HH Arranged:  PT HH Agency:  [place]  Status of Service:  Completed, signed off  Medicare Important Message Given:    Date Medicare IM Given:    Medicare IM give by:    Date Additional Medicare IM Given:    Additional Medicare Important Message give by:     If discussed at [place] of Stay Meetings, dates discussed:    Additional Comments:  Dessa Phi, RN 05/28/2015, 11:16 AM

## 2015-05-29 ENCOUNTER — Encounter (HOSPITAL_COMMUNITY)
Admission: RE | Admit: 2015-05-29 | Discharge: 2015-05-29 | Disposition: A | Discharge status: Home / Self Care | Payer: Medicare Other | Source: Ambulatory Visit | Attending: Nephrology | Admitting: Nephrology

## 2015-05-29 DIAGNOSIS — N183 Chronic kidney disease, stage 3 (moderate): Secondary | ICD-10-CM | POA: Diagnosis not present

## 2015-05-29 DIAGNOSIS — D631 Anemia in chronic kidney disease: Secondary | ICD-10-CM | POA: Diagnosis not present

## 2015-05-29 MED ORDER — EPOETIN ALFA 10000 UNIT/ML IJ SOLN: 10000.0000 [IU] | INTRAMUSCULAR | Status: DC

## 2015-05-29 MED ORDER — SODIUM CHLORIDE 0.9 % IV SOLN
510.0000 mg | INTRAVENOUS | Status: DC
  Administered 2015-05-29: 510 mg via INTRAVENOUS
  Filled 2015-05-29: qty 17

## 2015-06-02 DIAGNOSIS — R531 Weakness: Secondary | ICD-10-CM | POA: Diagnosis not present

## 2015-06-02 DIAGNOSIS — E785 Hyperlipidemia, unspecified: Secondary | ICD-10-CM | POA: Diagnosis not present

## 2015-06-02 DIAGNOSIS — L280 Lichen simplex chronicus: Secondary | ICD-10-CM | POA: Diagnosis not present

## 2015-06-02 DIAGNOSIS — F0390 Unspecified dementia without behavioral disturbance: Secondary | ICD-10-CM | POA: Diagnosis not present

## 2015-06-02 DIAGNOSIS — F329 Major depressive disorder, single episode, unspecified: Secondary | ICD-10-CM | POA: Diagnosis not present

## 2015-06-02 DIAGNOSIS — Z794 Long term (current) use of insulin: Secondary | ICD-10-CM | POA: Diagnosis not present

## 2015-06-02 DIAGNOSIS — E1100 Type 2 diabetes mellitus with hyperosmolarity without nonketotic hyperglycemic-hyperosmolar coma (NKHHC): Secondary | ICD-10-CM | POA: Diagnosis not present

## 2015-06-02 DIAGNOSIS — G4700 Insomnia, unspecified: Secondary | ICD-10-CM | POA: Diagnosis not present

## 2015-06-02 DIAGNOSIS — I129 Hypertensive chronic kidney disease with stage 1 through stage 4 chronic kidney disease, or unspecified chronic kidney disease: Secondary | ICD-10-CM | POA: Diagnosis not present

## 2015-06-02 DIAGNOSIS — N184 Chronic kidney disease, stage 4 (severe): Secondary | ICD-10-CM | POA: Diagnosis not present

## 2015-06-02 DIAGNOSIS — I252 Old myocardial infarction: Secondary | ICD-10-CM | POA: Diagnosis not present

## 2015-06-02 DIAGNOSIS — I739 Peripheral vascular disease, unspecified: Secondary | ICD-10-CM | POA: Diagnosis not present

## 2015-06-02 DIAGNOSIS — E1122 Type 2 diabetes mellitus with diabetic chronic kidney disease: Secondary | ICD-10-CM | POA: Diagnosis not present

## 2015-06-02 DIAGNOSIS — Z853 Personal history of malignant neoplasm of breast: Secondary | ICD-10-CM | POA: Diagnosis not present

## 2015-06-02 DIAGNOSIS — I25119 Atherosclerotic heart disease of native coronary artery with unspecified angina pectoris: Secondary | ICD-10-CM | POA: Diagnosis not present

## 2015-06-02 DIAGNOSIS — E1165 Type 2 diabetes mellitus with hyperglycemia: Secondary | ICD-10-CM | POA: Diagnosis not present

## 2015-06-02 DIAGNOSIS — Z87891 Personal history of nicotine dependence: Secondary | ICD-10-CM | POA: Diagnosis not present

## 2015-06-02 DIAGNOSIS — Z955 Presence of coronary angioplasty implant and graft: Secondary | ICD-10-CM | POA: Diagnosis not present

## 2015-06-02 DIAGNOSIS — Z7982 Long term (current) use of aspirin: Secondary | ICD-10-CM | POA: Diagnosis not present

## 2015-06-02 DIAGNOSIS — D631 Anemia in chronic kidney disease: Secondary | ICD-10-CM | POA: Diagnosis not present

## 2015-06-02 DIAGNOSIS — Z9181 History of falling: Secondary | ICD-10-CM | POA: Diagnosis not present

## 2015-06-02 DIAGNOSIS — E1142 Type 2 diabetes mellitus with diabetic polyneuropathy: Secondary | ICD-10-CM | POA: Diagnosis not present

## 2015-06-02 DIAGNOSIS — M25561 Pain in right knee: Secondary | ICD-10-CM | POA: Diagnosis not present

## 2015-06-03 DIAGNOSIS — E1165 Type 2 diabetes mellitus with hyperglycemia: Secondary | ICD-10-CM | POA: Diagnosis not present

## 2015-06-03 DIAGNOSIS — R531 Weakness: Secondary | ICD-10-CM | POA: Diagnosis not present

## 2015-06-03 DIAGNOSIS — E1122 Type 2 diabetes mellitus with diabetic chronic kidney disease: Secondary | ICD-10-CM | POA: Diagnosis not present

## 2015-06-03 DIAGNOSIS — I129 Hypertensive chronic kidney disease with stage 1 through stage 4 chronic kidney disease, or unspecified chronic kidney disease: Secondary | ICD-10-CM | POA: Diagnosis not present

## 2015-06-03 DIAGNOSIS — E1100 Type 2 diabetes mellitus with hyperosmolarity without nonketotic hyperglycemic-hyperosmolar coma (NKHHC): Secondary | ICD-10-CM | POA: Diagnosis not present

## 2015-06-03 DIAGNOSIS — M25561 Pain in right knee: Secondary | ICD-10-CM | POA: Diagnosis not present

## 2015-06-04 ENCOUNTER — Encounter: Payer: Self-pay | Admitting: Internal Medicine

## 2015-06-04 ENCOUNTER — Other Ambulatory Visit (INDEPENDENT_AMBULATORY_CARE_PROVIDER_SITE_OTHER): Payer: Medicare Other

## 2015-06-04 ENCOUNTER — Telehealth: Payer: Self-pay

## 2015-06-04 ENCOUNTER — Other Ambulatory Visit (HOSPITAL_COMMUNITY): Payer: Self-pay

## 2015-06-04 ENCOUNTER — Ambulatory Visit (INDEPENDENT_AMBULATORY_CARE_PROVIDER_SITE_OTHER): Payer: Medicare Other | Admitting: Internal Medicine

## 2015-06-04 VITALS — BP 112/58 | HR 65 | Temp 97.6°F | Resp 18 | Ht 63.0 in | Wt 157.0 lb

## 2015-06-04 DIAGNOSIS — F0390 Unspecified dementia without behavioral disturbance: Secondary | ICD-10-CM

## 2015-06-04 DIAGNOSIS — E1165 Type 2 diabetes mellitus with hyperglycemia: Secondary | ICD-10-CM

## 2015-06-04 DIAGNOSIS — Z794 Long term (current) use of insulin: Secondary | ICD-10-CM

## 2015-06-04 DIAGNOSIS — E1122 Type 2 diabetes mellitus with diabetic chronic kidney disease: Secondary | ICD-10-CM | POA: Diagnosis not present

## 2015-06-04 DIAGNOSIS — N184 Chronic kidney disease, stage 4 (severe): Secondary | ICD-10-CM

## 2015-06-04 DIAGNOSIS — IMO0002 Reserved for concepts with insufficient information to code with codable children: Secondary | ICD-10-CM

## 2015-06-04 DIAGNOSIS — R5383 Other fatigue: Secondary | ICD-10-CM | POA: Diagnosis not present

## 2015-06-04 DIAGNOSIS — I2510 Atherosclerotic heart disease of native coronary artery without angina pectoris: Secondary | ICD-10-CM

## 2015-06-04 DIAGNOSIS — I200 Unstable angina: Secondary | ICD-10-CM | POA: Diagnosis not present

## 2015-06-04 LAB — BASIC METABOLIC PANEL
BUN: 51 mg/dL — ABNORMAL HIGH (ref 6–23)
CALCIUM: 9.4 mg/dL (ref 8.4–10.5)
CO2: 26 mEq/L (ref 19–32)
Chloride: 103 mEq/L (ref 96–112)
Creatinine, Ser: 3.5 mg/dL — ABNORMAL HIGH (ref 0.40–1.20)
GFR: 15.99 mL/min — AB (ref >60.00)
GLUCOSE: 48 mg/dL — AB (ref 70–99)
POTASSIUM: 4.7 meq/L (ref 3.5–5.1)
SODIUM: 139 meq/L (ref 135–145)

## 2015-06-04 LAB — TSH: TSH: 3.49 u[IU]/mL (ref 0.35–4.50)

## 2015-06-04 NOTE — Progress Notes (Signed)
Pre visit review using our clinic review tool, if applicable. No additional management support is needed unless otherwise documented below in the visit note. 

## 2015-06-04 NOTE — Assessment & Plan Note (Signed)
Follow-up with Dr. Charlotta Newton

## 2015-06-04 NOTE — Assessment & Plan Note (Signed)
BMET  The increase risk of heart attack or stroke of almost 180% with an A1c of 13.8% was discussed with her daughter.

## 2015-06-04 NOTE — Assessment & Plan Note (Signed)
TSH 

## 2015-06-04 NOTE — Patient Instructions (Addendum)
  Your next office appointment will be determined based upon review of your pending labs.  Those written interpretation of the lab results and instructions will be transmitted to you by mail for your records.  Critical results will be called.   Followup as needed for any active or acute issue. Please report any significant change in your symptoms.    Use an anti-inflammatory cream such as Aspercreme or Zostrix cream twice a day to the affected area as needed. In lieu of this warm moist compresses or  hot water bottle can be used. Do not apply ice .Arthritis strength Tylenol recommended as per the package insert.

## 2015-06-04 NOTE — Telephone Encounter (Signed)
CRITICAL GLUCOSE 38 [place] at 3:44. From hope in the [place] lab. Will inform MD.

## 2015-06-04 NOTE — Telephone Encounter (Signed)
Her daughter was called; juice and peanut butter recommended. No insulin this evening. Insulin doses to be cut in half until she is seen by Dr. Cruzita Lederer.

## 2015-06-04 NOTE — Progress Notes (Signed)
   Subjective:    Patient ID: [name], female    DOB: 10-24-1928, 81 y.o.   MRN: [id number]  HPI She was seen in the emergency room 05/26/15 for back pain related to mechanical fall. There was no neurologic or cardiologic prodrome prior to the fall. In the ER she was found to have profound hyperglycemia prompting admission for uncontrolled diabetes with hyperosmolality. This is in the context of advanced stage III renal insufficiency and coronary disease.  Admission sodium was 130 and glucose 587. A1c was 13.8%. Creatinine 3.45, BUN 53, and GFR 13. At the time of discharge these values were glucose 70, BUN 56, creatinine 3.44, and GFR 13. She exhibited significant anemia. Her admission hemoglobin was 11.7 and hematocrit 35.2. At discharge these were 10.4 and 31.3.  She last saw her Endocrinologist 09/01/14.  Past history includes coronary artery stenting 2.  The daughter who is with her today is not familiar with her active medical history. She does live with another daughter Rolanda Jay [id number] [id number]. Apparently the son Consuella Lose is the healthcare power of attorney. Apparently there is some family discord as to their mother's healthcare disposition.  Review of Systems Since discharge from the hospital she has continued to have back pain  in the mid to lower area on the left up to 8 on a 10 scale. She's been using an over-the-counter medication ( Melcaf ?)  which is providing some relief. She has been having urinary frequency 3-5 times per day.  Her fluid intake has decreased.  Blood glucose was 300 at 12:00 today. She's not monitoring blood pressure at home. She denies other cardiovascular symptoms at this time.     Objective:   Physical Exam  Pertinent or positive findings include: She is profoundly somnolent and lethargic. She was unable to give the date or her daughter's birthday. She finally identified the President after several minutes.  Complete dentures present. She  appears to have slight asterixis in both hands.  The first heart sound is increased. Breath sounds are decreased. Pedal pulses are decreased but palpable.  General appearance :adequately nourished; in no distress.  Eyes: No conjunctival inflammation or scleral icterus is present.  Oral exam:  Lips and gums are healthy appearing.There is no oropharyngeal erythema or exudate noted.   Heart:  Normal rate and regular rhythm. S2 normal without gallop, murmur, click, rub or other extra sounds    Lungs:Chest clear to auscultation; no wheezes, rhonchi,rales ,or rubs present.No increased work of breathing.   Abdomen: bowel sounds normal, soft and non-tender without masses, organomegaly or hernias noted.  No guarding or rebound. No flank tenderness to percussion.  Vascular : all pulses equal ; no bruits present.  Skin:Warm & dry.  Intact without suspicious lesions or rashes ; no tenting .   Lymphatic: No lymphadenopathy is noted about the head, neck, axilla.   Neuro: Strength, tone decreased.     Assessment & Plan:  See Current Assessment & Plan in Problem List under specific Diagnosis

## 2015-06-05 ENCOUNTER — Encounter (HOSPITAL_COMMUNITY)
Admission: RE | Admit: 2015-06-05 | Discharge: 2015-06-05 | Disposition: A | Discharge status: Home / Self Care | Payer: Medicare Other | Source: Ambulatory Visit | Attending: Nephrology | Admitting: Nephrology

## 2015-06-05 ENCOUNTER — Other Ambulatory Visit: Payer: Self-pay

## 2015-06-05 ENCOUNTER — Telehealth: Payer: Self-pay

## 2015-06-05 DIAGNOSIS — R531 Weakness: Secondary | ICD-10-CM | POA: Diagnosis not present

## 2015-06-05 DIAGNOSIS — E1100 Type 2 diabetes mellitus with hyperosmolarity without nonketotic hyperglycemic-hyperosmolar coma (NKHHC): Secondary | ICD-10-CM | POA: Diagnosis not present

## 2015-06-05 DIAGNOSIS — I129 Hypertensive chronic kidney disease with stage 1 through stage 4 chronic kidney disease, or unspecified chronic kidney disease: Secondary | ICD-10-CM | POA: Diagnosis not present

## 2015-06-05 DIAGNOSIS — E1122 Type 2 diabetes mellitus with diabetic chronic kidney disease: Secondary | ICD-10-CM | POA: Diagnosis not present

## 2015-06-05 DIAGNOSIS — D631 Anemia in chronic kidney disease: Secondary | ICD-10-CM | POA: Diagnosis not present

## 2015-06-05 DIAGNOSIS — E1165 Type 2 diabetes mellitus with hyperglycemia: Secondary | ICD-10-CM | POA: Diagnosis not present

## 2015-06-05 DIAGNOSIS — M25561 Pain in right knee: Secondary | ICD-10-CM | POA: Diagnosis not present

## 2015-06-05 MED ORDER — SODIUM CHLORIDE 0.9 % IV SOLN
510.0000 mg | INTRAVENOUS | Status: AC
  Administered 2015-06-05: 510 mg via INTRAVENOUS
  Filled 2015-06-05: qty 17

## 2015-06-05 NOTE — Patient Outreach (Signed)
[place] [facility]) Care Management  06/05/2015  Kymm Sock [birth year] [id number]   Issue: In-basket communication received from: Dr. Linna Darner office contact:  Dian Queen.  "MD that assessed pt yesterday feels that pt would benefit from being monitored more closely. BS range from 300+ to below 40. Unfortunately, pt does not have adequate support and supervision to help control sugars and to confirm medication adherence.  Son would like to keep patient out of an ALF and what can he do to prevent this. Reaching out to [facility] to verify if pt does qualify for [facility] services."   RN CM responded via in-basket:  Received your message and I did verify that patient is on the All Payor List for [facility] services.    I will outreach for Screening on Monday 11/14.  My plan will be to refer out to our [place] CM and SW for home assessment and discuss options.   Even if patient is eligible for [facility] services, this may not be enough to provide the level of care she needs.   Our team will discuss all options.    Referral sent to [facility] Referral Department  Respectfully,   Mariann Laster, RN, BSN, [facility], [place] Management Care Management Coordinator [phone] Direct [phone] Cell [phone] Office [phone] Fax

## 2015-06-05 NOTE — Telephone Encounter (Signed)
MD that assessed pt yesterday feels that pt would benefit from being monitored more closely. BS range from 300+ to below 40. Unfortunately, pt does not have adequate support and supervision to help control sugars and to confirm medication adherence.   Reaching out to [facility] to verify if pt does qualify for [facility] services.

## 2015-06-05 NOTE — Telephone Encounter (Signed)
[name] called. Stated that he has questions on this. He would like to do what he can to keep her out of an ALF. He wants to know what can be done to help prevent this.

## 2015-06-08 ENCOUNTER — Other Ambulatory Visit: Payer: Self-pay

## 2015-06-08 NOTE — Telephone Encounter (Signed)
Spoke to [name] (pt son and caregiver). Informed him about the goals of [facility] and assess what services pt will qualify for.   [name] stated understanding.

## 2015-06-08 NOTE — Patient Outreach (Signed)
[place] [facility]) Care Management  06/08/2015  [name] 1929/01/16 [id number]   Referral from MD, assigned Mariann Laster, RN to outreach for [place] Management services.  Thanks, Ronnell Fresh[place]. [place], [place] Assistant Phone: [phone] Fax: [phone]

## 2015-06-08 NOTE — Patient Outreach (Signed)
[name]) Care Management  06/08/2015  [name] 1928/12/04 [id number]  Telephonic Screening Note:   Referral Date:  06/08/2015 Referral Source:  Dr. Unice Cobble Referral Issue:  "MD feels patient would benefit from being monitored more closely.  BS range from 300+ to below 40.  Unfortunately, [patient] does not have adequate support and supervision to help control sugars and to confirm medication adherence." H/o [name] would like to do what he can to keep patient out of an ALF and would like to know what can be done to help prevent this.   Outreach call to Shelby Dubin [phone].  Contact not reached.    Plan:  RN CM left HIPAA compliant voice message with name and number requesting call back.  RN CM scheduled for next outreach call.   Mariann Laster, RN, BSN, [employer] [facility] CCM  [employer] Management Coordinator [phone] Direct [phone] Cell [phone] Office [phone] Fax

## 2015-06-10 ENCOUNTER — Other Ambulatory Visit: Payer: Self-pay

## 2015-06-10 ENCOUNTER — Ambulatory Visit: Payer: Self-pay

## 2015-06-10 ENCOUNTER — Other Ambulatory Visit: Payer: Self-pay | Admitting: Internal Medicine

## 2015-06-10 NOTE — Patient Outreach (Signed)
[place] [facility]) Care Management  06/10/2015  [name] September 19, 1928 [id number]  Outreach call to patient's son.  Left Voice message.  Additional outreach to patient's daughter.  Daughter states plan in place and [facility] services are not needed.  States in process of establishing services with [facility] and believes this program will meet patients needs.   Plan:  RN CM notified [facility] Care Management Assistance:  Case closed; active with other program ([facility]).  RN CM sent Dr. Asa Lente and Dr. Linna Darner (referring MD) case closure notification letter.   Mariann Laster, RN, BSN, [facility], CCM  [employer] Management Coordinator [phone] Direct [phone] Cell [phone] Office [phone] Fax

## 2015-06-12 ENCOUNTER — Other Ambulatory Visit: Payer: Self-pay | Admitting: Internal Medicine

## 2015-06-12 DIAGNOSIS — I129 Hypertensive chronic kidney disease with stage 1 through stage 4 chronic kidney disease, or unspecified chronic kidney disease: Secondary | ICD-10-CM | POA: Diagnosis not present

## 2015-06-12 DIAGNOSIS — R531 Weakness: Secondary | ICD-10-CM | POA: Diagnosis not present

## 2015-06-12 DIAGNOSIS — M25561 Pain in right knee: Secondary | ICD-10-CM | POA: Diagnosis not present

## 2015-06-12 DIAGNOSIS — E1100 Type 2 diabetes mellitus with hyperosmolarity without nonketotic hyperglycemic-hyperosmolar coma (NKHHC): Secondary | ICD-10-CM | POA: Diagnosis not present

## 2015-06-12 DIAGNOSIS — E1122 Type 2 diabetes mellitus with diabetic chronic kidney disease: Secondary | ICD-10-CM | POA: Diagnosis not present

## 2015-06-12 DIAGNOSIS — E1165 Type 2 diabetes mellitus with hyperglycemia: Secondary | ICD-10-CM | POA: Diagnosis not present

## 2015-06-16 ENCOUNTER — Ambulatory Visit: Payer: Medicare Other | Admitting: Internal Medicine

## 2015-06-17 ENCOUNTER — Other Ambulatory Visit (HOSPITAL_COMMUNITY): Payer: Self-pay | Admitting: *Deleted

## 2015-06-19 ENCOUNTER — Encounter (HOSPITAL_COMMUNITY): Payer: Medicare Other

## 2015-06-19 DIAGNOSIS — E1122 Type 2 diabetes mellitus with diabetic chronic kidney disease: Secondary | ICD-10-CM | POA: Diagnosis not present

## 2015-06-19 DIAGNOSIS — E1100 Type 2 diabetes mellitus with hyperosmolarity without nonketotic hyperglycemic-hyperosmolar coma (NKHHC): Secondary | ICD-10-CM | POA: Diagnosis not present

## 2015-06-19 DIAGNOSIS — E1165 Type 2 diabetes mellitus with hyperglycemia: Secondary | ICD-10-CM | POA: Diagnosis not present

## 2015-06-19 DIAGNOSIS — M25561 Pain in right knee: Secondary | ICD-10-CM | POA: Diagnosis not present

## 2015-06-19 DIAGNOSIS — R531 Weakness: Secondary | ICD-10-CM | POA: Diagnosis not present

## 2015-06-19 DIAGNOSIS — I129 Hypertensive chronic kidney disease with stage 1 through stage 4 chronic kidney disease, or unspecified chronic kidney disease: Secondary | ICD-10-CM | POA: Diagnosis not present

## 2015-06-25 ENCOUNTER — Emergency Department (HOSPITAL_COMMUNITY)
Admission: EM | Admit: 2015-06-25 | Discharge: 2015-06-26 | Disposition: A | Discharge status: Home / Self Care | Payer: Medicare Other | Attending: Emergency Medicine | Admitting: Emergency Medicine

## 2015-06-25 ENCOUNTER — Telehealth: Payer: Self-pay | Admitting: Internal Medicine

## 2015-06-25 ENCOUNTER — Encounter (HOSPITAL_COMMUNITY): Payer: Self-pay | Admitting: Emergency Medicine

## 2015-06-25 DIAGNOSIS — Z853 Personal history of malignant neoplasm of breast: Secondary | ICD-10-CM | POA: Insufficient documentation

## 2015-06-25 DIAGNOSIS — Z794 Long term (current) use of insulin: Secondary | ICD-10-CM | POA: Insufficient documentation

## 2015-06-25 DIAGNOSIS — Z87891 Personal history of nicotine dependence: Secondary | ICD-10-CM | POA: Insufficient documentation

## 2015-06-25 DIAGNOSIS — Z9861 Coronary angioplasty status: Secondary | ICD-10-CM | POA: Insufficient documentation

## 2015-06-25 DIAGNOSIS — D649 Anemia, unspecified: Secondary | ICD-10-CM | POA: Diagnosis not present

## 2015-06-25 DIAGNOSIS — E1140 Type 2 diabetes mellitus with diabetic neuropathy, unspecified: Secondary | ICD-10-CM | POA: Insufficient documentation

## 2015-06-25 DIAGNOSIS — Z872 Personal history of diseases of the skin and subcutaneous tissue: Secondary | ICD-10-CM | POA: Diagnosis not present

## 2015-06-25 DIAGNOSIS — F329 Major depressive disorder, single episode, unspecified: Secondary | ICD-10-CM | POA: Diagnosis not present

## 2015-06-25 DIAGNOSIS — I129 Hypertensive chronic kidney disease with stage 1 through stage 4 chronic kidney disease, or unspecified chronic kidney disease: Secondary | ICD-10-CM | POA: Insufficient documentation

## 2015-06-25 DIAGNOSIS — R339 Retention of urine, unspecified: Secondary | ICD-10-CM | POA: Diagnosis present

## 2015-06-25 DIAGNOSIS — I252 Old myocardial infarction: Secondary | ICD-10-CM | POA: Diagnosis not present

## 2015-06-25 DIAGNOSIS — Z7982 Long term (current) use of aspirin: Secondary | ICD-10-CM | POA: Diagnosis not present

## 2015-06-25 DIAGNOSIS — Z951 Presence of aortocoronary bypass graft: Secondary | ICD-10-CM | POA: Diagnosis not present

## 2015-06-25 DIAGNOSIS — I25119 Atherosclerotic heart disease of native coronary artery with unspecified angina pectoris: Secondary | ICD-10-CM | POA: Insufficient documentation

## 2015-06-25 DIAGNOSIS — F0390 Unspecified dementia without behavioral disturbance: Secondary | ICD-10-CM | POA: Insufficient documentation

## 2015-06-25 DIAGNOSIS — Z9889 Other specified postprocedural states: Secondary | ICD-10-CM | POA: Insufficient documentation

## 2015-06-25 DIAGNOSIS — N184 Chronic kidney disease, stage 4 (severe): Secondary | ICD-10-CM | POA: Diagnosis not present

## 2015-06-25 DIAGNOSIS — Z79899 Other long term (current) drug therapy: Secondary | ICD-10-CM | POA: Diagnosis not present

## 2015-06-25 DIAGNOSIS — R739 Hyperglycemia, unspecified: Secondary | ICD-10-CM

## 2015-06-25 DIAGNOSIS — E7800 Pure hypercholesterolemia, unspecified: Secondary | ICD-10-CM | POA: Diagnosis not present

## 2015-06-25 DIAGNOSIS — E1165 Type 2 diabetes mellitus with hyperglycemia: Secondary | ICD-10-CM | POA: Diagnosis not present

## 2015-06-25 DIAGNOSIS — Z8669 Personal history of other diseases of the nervous system and sense organs: Secondary | ICD-10-CM | POA: Insufficient documentation

## 2015-06-25 DIAGNOSIS — Z7902 Long term (current) use of antithrombotics/antiplatelets: Secondary | ICD-10-CM | POA: Insufficient documentation

## 2015-06-25 LAB — URINE MICROSCOPIC-ADD ON: RBC / HPF: NONE SEEN RBC/hpf (ref 0–5)

## 2015-06-25 LAB — CBC WITH DIFFERENTIAL/PLATELET
Basophils Absolute: 0 10*3/uL (ref 0.0–0.1)
Basophils Relative: 0 %
EOS ABS: 0.2 10*3/uL (ref 0.0–0.7)
EOS PCT: 3 %
HCT: 35.8 % — ABNORMAL LOW (ref 36.0–46.0)
Hemoglobin: 12.1 g/dL (ref 12.0–15.0)
LYMPHS ABS: 2.5 10*3/uL (ref 0.7–4.0)
LYMPHS PCT: 35 %
MCH: 31.1 pg (ref 26.0–34.0)
MCHC: 33.8 g/dL (ref 30.0–36.0)
MCV: 92 fL (ref 78.0–100.0)
MONO ABS: 0.4 10*3/uL (ref 0.1–1.0)
MONOS PCT: 6 %
Neutro Abs: 4 10*3/uL (ref 1.7–7.7)
Neutrophils Relative %: 56 %
PLATELETS: 206 10*3/uL (ref 150–400)
RBC: 3.89 MIL/uL (ref 3.87–5.11)
RDW: 13.3 % (ref 11.5–15.5)
WBC: 7.2 10*3/uL (ref 4.0–10.5)

## 2015-06-25 LAB — URINALYSIS, ROUTINE W REFLEX MICROSCOPIC
Bilirubin Urine: NEGATIVE
Hgb urine dipstick: NEGATIVE
KETONES UR: NEGATIVE mg/dL
Nitrite: NEGATIVE
PH: 5 (ref 5.0–8.0)
Protein, ur: NEGATIVE mg/dL
Specific Gravity, Urine: 1.013 (ref 1.005–1.030)

## 2015-06-25 LAB — BASIC METABOLIC PANEL
ANION GAP: 10 (ref 5–15)
BUN: 47 mg/dL — AB (ref 6–20)
CHLORIDE: 94 mmol/L — AB (ref 101–111)
CO2: 23 mmol/L (ref 22–32)
Calcium: 8.6 mg/dL — ABNORMAL LOW (ref 8.9–10.3)
Creatinine, Ser: 3.26 mg/dL — ABNORMAL HIGH (ref 0.44–1.00)
GFR calc Af Amer: 14 mL/min — ABNORMAL LOW (ref >60)
GFR, EST NON AFRICAN AMERICAN: 12 mL/min — AB (ref >60)
GLUCOSE: 532 mg/dL — AB (ref 65–99)
POTASSIUM: 5.3 mmol/L — AB (ref 3.5–5.1)
Sodium: 127 mmol/L — ABNORMAL LOW (ref 135–145)

## 2015-06-25 LAB — CBG MONITORING, ED: Glucose-Capillary: 464 mg/dL — ABNORMAL HIGH (ref 65–99)

## 2015-06-25 MED ORDER — INSULIN ASPART 100 UNIT/ML ~~LOC~~ SOLN
10.0000 [IU] | Freq: Once | SUBCUTANEOUS | Status: AC
  Administered 2015-06-25: 10 [IU] via SUBCUTANEOUS
  Filled 2015-06-25: qty 1

## 2015-06-25 MED ORDER — SODIUM CHLORIDE 0.9 % IV BOLUS (SEPSIS)
1000.0000 mL | Freq: Once | INTRAVENOUS | Status: AC
  Administered 2015-06-25: 1000 mL via INTRAVENOUS

## 2015-06-25 MED ORDER — INSULIN ASPART 100 UNIT/ML ~~LOC~~ SOLN: 12.0000 [IU] | Freq: Once | SUBCUTANEOUS | Status: DC

## 2015-06-25 MED ORDER — SODIUM CHLORIDE 0.9 % IV SOLN
INTRAVENOUS | Status: DC
  Administered 2015-06-25: 21:00:00 via INTRAVENOUS

## 2015-06-25 NOTE — Discharge Instructions (Signed)
Hyperglycemia °Hyperglycemia occurs when the glucose (sugar) in your blood is too high. Hyperglycemia can happen for many reasons, but it most often happens to people who do not know they have diabetes or are not managing their diabetes properly.  °CAUSES  °Whether you have diabetes or not, there are other causes of hyperglycemia. Hyperglycemia can occur when you have diabetes, but it can also occur in other situations that you might not be as aware of, such as: °Diabetes °· If you have diabetes and are having problems controlling your blood glucose, hyperglycemia could occur because of some of the following reasons: °¨ Not following your meal plan. °¨ Not taking your diabetes medications or not taking it properly. °¨ Exercising less or doing less activity than you normally do. °¨ Being sick. °Pre-diabetes °· This cannot be ignored. Before people develop Type 2 diabetes, they almost always have "pre-diabetes." This is when your blood glucose levels are higher than normal, but not yet high enough to be diagnosed as diabetes. Research has shown that some long-term damage to the body, especially the heart and circulatory system, may already be occurring during pre-diabetes. If you take action to manage your blood glucose when you have pre-diabetes, you may delay or prevent Type 2 diabetes from developing. °Stress °· If you have diabetes, you may be "diet" controlled or on oral medications or insulin to control your diabetes. However, you may find that your blood glucose is higher than usual in the hospital whether you have diabetes or not. This is often referred to as "stress hyperglycemia." Stress can elevate your blood glucose. This happens because of hormones put out by the body during times of stress. If stress has been the cause of your high blood glucose, it can be followed regularly by your caregiver. That way he/she can make sure your hyperglycemia does not continue to get worse or progress to  diabetes. °Steroids °· Steroids are medications that act on the infection fighting system (immune system) to block inflammation or infection. One side effect can be a rise in blood glucose. Most people can produce enough extra insulin to allow for this rise, but for those who cannot, steroids make blood glucose levels go even higher. It is not unusual for steroid treatments to "uncover" diabetes that is developing. It is not always possible to determine if the hyperglycemia will go away after the steroids are stopped. A special blood test called an A1c is sometimes done to determine if your blood glucose was elevated before the steroids were started. °SYMPTOMS °· Thirsty. °· Frequent urination. °· Dry mouth. °· Blurred vision. °· Tired or fatigue. °· Weakness. °· Sleepy. °· Tingling in feet or leg. °DIAGNOSIS  °Diagnosis is made by monitoring blood glucose in one or all of the following ways: °· A1c test. This is a chemical found in your blood. °· Fingerstick blood glucose monitoring. °· Laboratory results. °TREATMENT  °First, knowing the cause of the hyperglycemia is important before the hyperglycemia can be treated. Treatment may include, but is not be limited to: °· Education. °· Change or adjustment in medications. °· Change or adjustment in meal plan. °· Treatment for an illness, infection, etc. °· More frequent blood glucose monitoring. °· Change in exercise plan. °· Decreasing or stopping steroids. °· Lifestyle changes. °HOME CARE INSTRUCTIONS  °· Test your blood glucose as directed. °· Exercise regularly. Your caregiver will give you instructions about exercise. Pre-diabetes or diabetes which comes on with stress is helped by exercising. °· Eat wholesome,   balanced meals. Eat often and at regular, fixed times. Your caregiver or nutritionist will give you a meal plan to guide your sugar intake. °· Being at an ideal weight is important. If needed, losing as [place] as 10 to 15 pounds may help improve blood  glucose levels. °SEEK MEDICAL CARE IF:  °· You have questions about medicine, activity, or diet. °· You continue to have symptoms (problems such as increased thirst, urination, or weight gain). °SEEK IMMEDIATE MEDICAL CARE IF:  °· You are vomiting or have diarrhea. °· Your breath smells fruity. °· You are breathing faster or slower. °· You are very sleepy or incoherent. °· You have numbness, tingling, or pain in your feet or hands. °· You have chest pain. °· Your symptoms get worse even though you have been following your caregiver's orders. °· If you have any other questions or concerns. °  °This information is not intended to replace advice given to you by your health care provider. Make sure you discuss any questions you have with your health care provider. °  °Document Released: 01/04/2001 Document Revised: 10/03/2011 Document Reviewed: 03/17/2015 °Elsevier Interactive Patient Education ©2016 Elsevier Inc. ° °

## 2015-06-25 NOTE — ED Notes (Addendum)
Patient states she has felt like she has had urinary retention since yesterday. Patient states she urinated a small amount today, but still feel like her bladder is full. Denies nausea, vomiting, diarrhea. Hx of chronic kidney disease.

## 2015-06-25 NOTE — Telephone Encounter (Signed)
Patient Name: [name]  DOB: 03-Jan-1929    Initial Comment Caller States mother has not urinated since early yesterday morning. she has chronic kidney disease.    Nurse Assessment  Nurse: Raphael Gibney, RN, Vanita Ingles Date/Time (Eastern Time): 06/25/2015 2:09:31 PM  Confirm and document reason for call. If symptomatic, describe symptoms. ---Caller states mother has not urinated since early yesterday am. She has chronic kidney disease. She has the urge to urinate but can not. she is drinking.  Has the patient traveled out of the country within the last 30 days? ---Not Applicable  Does the patient have any new or worsening symptoms? ---Yes  Will a triage be completed? ---Yes  Related visit to physician within the last 2 weeks? ---No  Does the PT have any chronic conditions? (i.e. diabetes, asthma, etc.) ---Yes  List chronic conditions. ---chronic kidney disease; diabetes  Is this a behavioral health call? ---No     Guidelines    Guideline Title Affirmed Question Affirmed Notes  Urinary Symptoms [1] Unable to urinate (or only a few drops) > 4 hours AND [2] bladder feels very full (e.g., palpable bladder or strong urge to urinate)    Final Disposition User   Go to ED Now Raphael Gibney, RN, [facility] - ED   Disagree/Comply: Comply

## 2015-06-25 NOTE — ED Provider Notes (Signed)
CSN: [id number]     Arrival date & time 06/25/15  1555 History   First MD Initiated Contact with Patient 06/25/15 1955     Chief Complaint  Patient presents with  . Urinary Retention     (Consider location/radiation/quality/duration/timing/severity/associated sxs/prior Treatment) HPI Comments: Patient here with decreased urination since yesterday. Denies any dysuria and has been urinating small amounts. She has a sense of urinary urgency. No fever, vomiting, diarrhea. Does have a history of chronic kidney disease. Has had a history of urinary retention as well 2. Symptoms have been progressively worse. And nothing makes them better. No treatment use prior to arrival  The history is provided by the patient.    Past Medical History  Diagnosis Date  . 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 ANEMIA   . DEPRESSION   . HYPERTENSION   . PVD   . INSOMNIA, CHRONIC   . Dementia   . DIABETES MELLITUS, TYPE II     renal, neuro - peripheral neuropathy  . Hypercholesteremia   . Chronic kidney disease (CKD), stage IV (severe) ([place])   . Anemia of chronic disease   . Angina   . Non-Q wave myocardial infarction ([place]) 10/2009    /E-chart  . Inferior MI ([place]) 2000    "quiet"  . Breast cancer ([place]) 2000    right  . Loose stools   . Neurodermatitis    Past Surgical History  Procedure Laterality Date  . Capsulectomy  12/27/2010    L eye  . Breast lumpectomy  2000    right  . Cataract extraction w/ intraocular lens  implant, bilateral    . Coronary artery bypass graft  2000    CABG X1  . Coronary angioplasty with stent placement      "twice"  . Eye surgery      Cataracts (R) eye 06/1999 & (L) eye 09/1999- [facility]  . Combined hysteroscopy diagnostic / d&c  07/2004    /e-chart  . Dilation and curettage of uterus    . Cardiac surgery    . Left heart catheterization with coronary angiogram N/A 11/22/2011    Procedure: LEFT HEART CATHETERIZATION WITH CORONARY ANGIOGRAM;  Surgeon: Clent Demark, MD;  Location: [facility] CATH LAB;  Service: Cardiovascular;  Laterality: N/A;  . Left heart catheterization with coronary angiogram N/A 10/03/2012    Procedure: LEFT HEART CATHETERIZATION WITH CORONARY ANGIOGRAM;  Surgeon: Clent Demark, MD;  Location: [facility] CATH LAB;  Service: Cardiovascular;  Laterality: N/A;   Family History  Problem Relation Age of Onset  . Coronary artery disease Father    Social History  Substance Use Topics  . Smoking status: Former Smoker -- 1.00 packs/day for 20 years    Types: Cigarettes  . Smokeless tobacco: Never Used     Comment: 11/21/11 "quit smoking 30-40 years ago  . Alcohol Use: No     Comment: 11/21/11 "used to drink quit alot; last drink of alcohol maybe 30 years ago"   OB History    No data available     Review of Systems  All other systems reviewed and are negative.     Allergies  Ace inhibitors  Home Medications   Prior to Admission medications   Medication Sig Start Date End Date Taking? Authorizing Provider  acetaminophen (TYLENOL) 500 MG tablet Take 1,000 mg by mouth every 6 (six) hours as needed for mild pain.    Historical Provider, MD  amLODipine (NORVASC) 5 MG  tablet Take 1 tablet (5 mg total) by mouth daily. 12/29/14   Rowe Clack, MD  aspirin 81 MG tablet Take 1 tablet (81 mg total) by mouth daily. 11/23/11   Charolette Forward, MD  atorvastatin (LIPITOR) 20 MG tablet TAKE 1 TABLET BY MOUTH ONCE DAILY 05/05/15   Hoyt Koch, MD  Calcium Carbonate-Vitamin D 600-400 MG-UNIT per tablet Take 1 tablet by mouth 2 (two) times daily. 05/18/12   Rowe Clack, MD  clopidogrel (PLAVIX) 75 MG tablet Take 1 tablet (75 mg total) by mouth daily. 12/29/14   Rowe Clack, MD  donepezil (ARICEPT) 10 MG tablet TAKE 1 TABLET BY MOUTH EVERY NIGHT AT BEDTIME 04/14/15   Rowe Clack, MD  epoetin alfa (EPOGEN,PROCRIT) 09811 UNIT/ML injection Inject 10,000 Units into the skin every 28 (twenty-eight) days.     Historical Provider,  MD  ferrous sulfate 325 (65 FE) MG tablet Take 325 mg by mouth daily with breakfast. 05/18/12   Rowe Clack, MD  furosemide (LASIX) 40 MG tablet TAKE ONE TABLET BY MOUTH TWICE DAILY 05/05/15   Hoyt Koch, MD  HUMULIN R 100 UNIT/ML injection INJECT 8 UNITS SUBCUTANEOUSLY THREE TIMES A DAY (BEFORE MEALS) 06/10/15   Philemon Kingdom, MD  Insulin Detemir (LEVEMIR FLEXTOUCH) 100 UNIT/ML Pen INJECT 22 UNITS SUBCUTANEOUSLY EVERY NIGHT AT BEDTIME 11/21/14   Geradine Girt, DO  Insulin Detemir (LEVEMIR FLEXTOUCH) 100 UNIT/ML Pen INJECT 26 UNITS INTO THE SKIN IN THE MORNING. **PT NEEDS APPT FOR FUTURE REFILLS**LAST REFILL** 05/08/15   Philemon Kingdom, MD  Insulin Pen Needle (EASY TOUCH PEN NEEDLES) 31G X 8 MM MISC Inject 1 pen into the skin daily. 10/27/14   Rowe Clack, MD  Insulin Syringe-Needle U-100 (INSULIN SYRINGE 1CC/28G) 28G X 1/2" 1 ML MISC Use to check blood sugar three times daily ICD 10 E11 29 11/28/14   Hendricks Limes, MD  isosorbide mononitrate (IMDUR) 60 MG 24 hr tablet Take 60 mg by mouth daily.    Historical Provider, MD  LYRICA 75 MG capsule TAKE 1 CAPSULE BY MOUTH TWICE DAILY 05/05/15   Hoyt Koch, MD  pantoprazole (PROTONIX) 40 MG tablet Take 1 tablet (40 mg total) by mouth daily at 6 (six) AM. 10/04/12   Charolette Forward, MD  PARoxetine (PAXIL) 10 MG tablet TAKE 1 TABLET BY MOUTH EVERY NIGHT AT BEDTIME 04/14/15   Rowe Clack, MD  traMADol (ULTRAM) 50 MG tablet Take 1 tablet (50 mg total) by mouth every 8 (eight) hours as needed. Patient taking differently: Take 50 mg by mouth every 8 (eight) hours as needed. For pain 12/05/13   Hendricks Limes, MD  triamcinolone cream (KENALOG) 0.1 % APPLY TOPICALLY TWICE DAILY 06/15/15   Rowe Clack, MD   BP 167/59 mmHg  Pulse 64  Temp(Src) 97.9 F (36.6 C) (Oral)  Resp 15  Ht 5\' 3"  (1.6 m)  Wt 65.772 kg  BMI 25.69 kg/m2  SpO2 100% Physical Exam  Constitutional: She is oriented to person, place, and time.  She appears well-developed and well-nourished.  Non-toxic appearance. No distress.  HENT:  Head: Normocephalic and atraumatic.  Eyes: Conjunctivae, EOM and lids are normal. Pupils are equal, round, and reactive to light.  Neck: Normal range of motion. Neck supple. No tracheal deviation present. No thyroid mass present.  Cardiovascular: Normal rate, regular rhythm and normal heart sounds.  Exam reveals no gallop.   No murmur heard. Pulmonary/Chest: Effort normal and breath sounds normal. No stridor.  No respiratory distress. She has no decreased breath sounds. She has no wheezes. She has no rhonchi. She has no rales.  Abdominal: Soft. Normal appearance and bowel sounds are normal. She exhibits no distension. There is no tenderness. There is no rebound and no CVA tenderness.  Musculoskeletal: Normal range of motion. She exhibits no edema or tenderness.  Neurological: She is alert and oriented to person, place, and time. She has normal strength. No cranial nerve deficit or sensory deficit. GCS eye subscore is 4. GCS verbal subscore is 5. GCS motor subscore is 6.  Skin: Skin is warm and dry. No abrasion and no rash noted.  Psychiatric: She has a normal mood and affect. Her speech is normal and behavior is normal.  Nursing note and vitals reviewed.   ED Course  Procedures (including critical care time) Labs Review Labs Reviewed  URINALYSIS, ROUTINE W REFLEX MICROSCOPIC (NOT AT [facility])  CBC WITH DIFFERENTIAL/PLATELET    Imaging Review No results found. I have personally reviewed and evaluated these images and lab results as part of my medical decision-making.   EKG Interpretation None      MDM   Final diagnoses:  None    Patient had increased blood sugar and this was treated with IV fluids as well as insulin. She states that she has not used her normal p.m. dose. Repeat blood sugar has improved. Mild hyperkalemia noted at 5.3 and patient's renal function appears to be consistent with  her prior renal insufficiency. She is requesting to go home and will follow-up with her Dr.    Lacretia Leigh, MD 06/25/15 [phone]

## 2015-06-25 NOTE — ED Notes (Signed)
The nurse is going to call the IV team

## 2015-06-27 ENCOUNTER — Telehealth: Payer: Self-pay

## 2015-06-27 NOTE — Telephone Encounter (Signed)
-----   Message from Standley Brooking, RN sent at 06/05/2015  5:04 PM EST ----- Regarding: Patient follow-up response Kamron, Marklin [birth year] MRN:  [id number]  Received your message and I did verify that patient is on the All Payor List for [facility] services.    I will outreach for Screening on Monday 11/14.  My plan will be to refer out to our [facility] CM and SW for home assessment and discuss options.   Even if patient is eligible for PCS services, this may not be enough to provide the level of care she needs.   Our team will discuss all options.    Have a Blessed weekend!  Respectfully,   Mariann Laster, RN, BSN, [facility], [place] Management Care Management Coordinator [phone] Direct [phone] Cell [phone] Office [phone] Fax

## 2015-06-29 ENCOUNTER — Encounter: Payer: Self-pay | Admitting: Internal Medicine

## 2015-06-29 ENCOUNTER — Ambulatory Visit (INDEPENDENT_AMBULATORY_CARE_PROVIDER_SITE_OTHER): Payer: Medicare Other | Admitting: Internal Medicine

## 2015-06-29 VITALS — BP 112/60 | HR 86 | Temp 97.9°F | Resp 12 | Wt 154.8 lb

## 2015-06-29 DIAGNOSIS — Z794 Long term (current) use of insulin: Secondary | ICD-10-CM

## 2015-06-29 DIAGNOSIS — N184 Chronic kidney disease, stage 4 (severe): Secondary | ICD-10-CM

## 2015-06-29 DIAGNOSIS — I200 Unstable angina: Secondary | ICD-10-CM | POA: Diagnosis not present

## 2015-06-29 DIAGNOSIS — E1165 Type 2 diabetes mellitus with hyperglycemia: Secondary | ICD-10-CM | POA: Diagnosis not present

## 2015-06-29 DIAGNOSIS — E1122 Type 2 diabetes mellitus with diabetic chronic kidney disease: Secondary | ICD-10-CM | POA: Diagnosis not present

## 2015-06-29 DIAGNOSIS — IMO0002 Reserved for concepts with insufficient information to code with codable children: Secondary | ICD-10-CM

## 2015-06-29 MED ORDER — INSULIN REGULAR HUMAN 100 UNIT/ML IJ SOLN: INTRAMUSCULAR | Status: DC

## 2015-06-29 MED ORDER — INSULIN DETEMIR 100 UNIT/ML FLEXPEN: PEN_INJECTOR | SUBCUTANEOUS | Status: DC

## 2015-06-29 NOTE — Progress Notes (Signed)
Patient ID: [name], female   DOB: 01/06/1929, 81 y.o.   MRN: [id number]  Subjective:   HPI [name] is an 81 y.o. woman, returning for management of DM2, dx 1990, insulin-dependent, uncontrolled, with complications (CAD-status post MI in 2000 and 2011, PVD, CKD stage IV, peripheral neuropathy). Her daughter Ubaldo Glassing is with her today >> pt now lives with her. She was lost for f/u since 08/2014.   Her last hemoglobin A1c was: Lab Results  Component Value Date   HGBA1C 13.8* 05/27/2015   HGBA1C 15.4* 11/18/2014   HGBA1C 13.8* 07/23/2014  Previously: 11.9%, prev. 14.3% (08/24/2012).  She has decreased compliance due to dementia, mental confusion, and different caregivers.  She is on: - Levemir 23 units in am - misses it many times as daughter comes home and pt is sleeping - Regular 8 units tid (switched from NovoLog b/c her grazing habit)  - they tell me she is not taking the insulin at the center (eats b'fast and lunch there weekdays).  Patient checks her sugars 2x a day (no log).  - am: 150-400  (but mostly in the 200s) >> 200-300 >> 200s >> 200s - night: 300s She has hypoglycemia awareness, but unclear at what value.  She had a low, at 50, at OV with PCP - they do not know why... 4 days ago: 532...  - She has chronic kidney disease, with her last BUN/creatinine: Lab Results  Component Value Date   BUN 47* 06/25/2015   Lab Results  Component Value Date   CREATININE 3.26* 06/25/2015   - She sees her eye dr. every year. Last visit in 2015. She had cataract sx in the past.  - She has numbness and tingling in her legs and also burning R>L.   I reviewed pt's medications, allergies, PMH, social hx, family hx, and changes were documented in the history of present illness. Otherwise, unchanged from my initial visit note.  Review of Systems Constitutional: no weight gain or loss, no fatigue, + poor sleep Eyes: no blurry vision, no xerophthalmia ENT: no sore throat, no nodules  palpated in throat, no dysphagia/odynophagia, no hoarseness Cardiovascular: no CP/ SOB/palpitations/eg swelling Respiratory: no cough/SOB Gastrointestinal: no N/V/D/C Musculoskeletal: no muscle/joint aches Skin: no rash  Objective:   Physical Exam BP 112/60 mmHg  Pulse 86  Temp(Src) 97.9 F (36.6 C) (Oral)  Resp 12  Wt 154 lb 12.8 oz (70.217 kg)  SpO2 94% Body mass index is 27.43 kg/(m^2).  Wt Readings from Last 3 Encounters:  06/29/15 154 lb 12.8 oz (70.217 kg)  06/25/15 145 lb (65.772 kg)  06/05/15 159 lb (72.122 kg)   Constitutional: overweight, in NAD, mostly nonverbal Eyes: PERRLA, EOMI, no exophthalmos ENT: moist mucous membranes, no thyromegaly, no cervical lymphadenopathy Cardiovascular: RRR, No MRG Respiratory: CTA B Gastrointestinal: abdomen soft, NT, ND, BS+ Musculoskeletal: no deformities, strength intact in all 4 Skin: moist, warm     Assessment:     1. DM2, insulin-dependent, uncontrolled, with complications - CAD-status post MI in 2000 and 2011 - PVD - CKD stage IV - Peripheral neuropathy  Plan:     1. The patient has a long history of DM2, complicated with chronic kidney disease, which limits our treatment choices to insulin. She again returns after a long absence. She is taking the insulin doses intermittently - during the week, she is not getting b'fast and lunch mealtime insulin at the center! Also, she is not getting Levemir at all when she is sleeping and daughter comes  home after a 6 pm-6am shift. Unfortunately, I cannot evaluate the appropriateness of the regimen doses if she is getting intermittent doses. To avoid the risk of hypoglycemia, we backed off th doses and I strongly advised her to have her insulin with her at the center so she can be given the 2 R doses before the meals she eats there. Also, I advised her the daughter to wake pt up when she comes home so she can get the Levemir.    Patient Instructions  Please decrease Levemir to 20 units  in am. Decrease the Regular insulin to 5 units before a meal. Take the Regular insulin at the center, also!  Please return in 1-1.5 months with your sugar log.   - No labs needed today >> we reviewed her last HbA1c, which is terrible. - given new logs >> check sugars 2-3x a day, before meals and at bedtime - hx obtained from daughter >> pt does not check her sugars, does not give herself injections - I will see the patient back in 1-1.5 month with her log

## 2015-06-29 NOTE — Patient Instructions (Addendum)
Please decrease Levemir to 20 units in am. Decrease the Regular insulin to 5 units before a meal. Take the Regular insulin at the center, also!  Please return in 1-1.5 months with your sugar log.

## 2015-07-01 DIAGNOSIS — I129 Hypertensive chronic kidney disease with stage 1 through stage 4 chronic kidney disease, or unspecified chronic kidney disease: Secondary | ICD-10-CM | POA: Diagnosis not present

## 2015-07-01 DIAGNOSIS — I10 Essential (primary) hypertension: Secondary | ICD-10-CM | POA: Diagnosis not present

## 2015-07-01 DIAGNOSIS — I208 Other forms of angina pectoris: Secondary | ICD-10-CM | POA: Diagnosis not present

## 2015-07-01 DIAGNOSIS — I2510 Atherosclerotic heart disease of native coronary artery without angina pectoris: Secondary | ICD-10-CM | POA: Diagnosis not present

## 2015-07-01 DIAGNOSIS — E119 Type 2 diabetes mellitus without complications: Secondary | ICD-10-CM | POA: Diagnosis not present

## 2015-07-01 DIAGNOSIS — I252 Old myocardial infarction: Secondary | ICD-10-CM | POA: Diagnosis not present

## 2015-07-01 DIAGNOSIS — E785 Hyperlipidemia, unspecified: Secondary | ICD-10-CM | POA: Diagnosis not present

## 2015-07-01 DIAGNOSIS — M1990 Unspecified osteoarthritis, unspecified site: Secondary | ICD-10-CM | POA: Diagnosis not present

## 2015-07-01 DIAGNOSIS — D649 Anemia, unspecified: Secondary | ICD-10-CM | POA: Diagnosis not present

## 2015-07-02 ENCOUNTER — Encounter (HOSPITAL_COMMUNITY): Payer: Medicare Other

## 2015-07-03 ENCOUNTER — Other Ambulatory Visit: Payer: Self-pay | Admitting: Internal Medicine

## 2015-07-09 ENCOUNTER — Other Ambulatory Visit: Payer: Self-pay | Admitting: Internal Medicine

## 2015-07-18 ENCOUNTER — Inpatient Hospital Stay (HOSPITAL_COMMUNITY)
Admission: EM | Admit: 2015-07-18 | Discharge: 2015-07-19 | DRG: 700 | Disposition: A | Discharge status: Home / Self Care | Payer: Medicare Other | Attending: Internal Medicine | Admitting: Internal Medicine

## 2015-07-18 ENCOUNTER — Encounter (HOSPITAL_COMMUNITY): Payer: Self-pay | Admitting: Emergency Medicine

## 2015-07-18 DIAGNOSIS — R739 Hyperglycemia, unspecified: Secondary | ICD-10-CM | POA: Diagnosis present

## 2015-07-18 DIAGNOSIS — E860 Dehydration: Secondary | ICD-10-CM | POA: Diagnosis present

## 2015-07-18 DIAGNOSIS — E1122 Type 2 diabetes mellitus with diabetic chronic kidney disease: Secondary | ICD-10-CM | POA: Diagnosis not present

## 2015-07-18 DIAGNOSIS — I252 Old myocardial infarction: Secondary | ICD-10-CM

## 2015-07-18 DIAGNOSIS — I2510 Atherosclerotic heart disease of native coronary artery without angina pectoris: Secondary | ICD-10-CM | POA: Diagnosis present

## 2015-07-18 DIAGNOSIS — Z8249 Family history of ischemic heart disease and other diseases of the circulatory system: Secondary | ICD-10-CM

## 2015-07-18 DIAGNOSIS — E1165 Type 2 diabetes mellitus with hyperglycemia: Secondary | ICD-10-CM | POA: Diagnosis not present

## 2015-07-18 DIAGNOSIS — N179 Acute kidney failure, unspecified: Secondary | ICD-10-CM | POA: Diagnosis not present

## 2015-07-18 DIAGNOSIS — D638 Anemia in other chronic diseases classified elsewhere: Secondary | ICD-10-CM | POA: Diagnosis present

## 2015-07-18 DIAGNOSIS — F329 Major depressive disorder, single episode, unspecified: Secondary | ICD-10-CM | POA: Diagnosis present

## 2015-07-18 DIAGNOSIS — F0390 Unspecified dementia without behavioral disturbance: Secondary | ICD-10-CM | POA: Diagnosis present

## 2015-07-18 DIAGNOSIS — Z7902 Long term (current) use of antithrombotics/antiplatelets: Secondary | ICD-10-CM | POA: Diagnosis not present

## 2015-07-18 DIAGNOSIS — E7800 Pure hypercholesterolemia, unspecified: Secondary | ICD-10-CM | POA: Diagnosis present

## 2015-07-18 DIAGNOSIS — I739 Peripheral vascular disease, unspecified: Secondary | ICD-10-CM | POA: Diagnosis present

## 2015-07-18 DIAGNOSIS — Z79899 Other long term (current) drug therapy: Secondary | ICD-10-CM | POA: Diagnosis not present

## 2015-07-18 DIAGNOSIS — E869 Volume depletion, unspecified: Secondary | ICD-10-CM | POA: Diagnosis present

## 2015-07-18 DIAGNOSIS — IMO0002 Reserved for concepts with insufficient information to code with codable children: Secondary | ICD-10-CM | POA: Diagnosis present

## 2015-07-18 DIAGNOSIS — Z951 Presence of aortocoronary bypass graft: Secondary | ICD-10-CM

## 2015-07-18 DIAGNOSIS — Z9842 Cataract extraction status, left eye: Secondary | ICD-10-CM

## 2015-07-18 DIAGNOSIS — E1129 Type 2 diabetes mellitus with other diabetic kidney complication: Secondary | ICD-10-CM | POA: Diagnosis present

## 2015-07-18 DIAGNOSIS — Z955 Presence of coronary angioplasty implant and graft: Secondary | ICD-10-CM

## 2015-07-18 DIAGNOSIS — Z9841 Cataract extraction status, right eye: Secondary | ICD-10-CM | POA: Diagnosis not present

## 2015-07-18 DIAGNOSIS — G4700 Insomnia, unspecified: Secondary | ICD-10-CM | POA: Diagnosis present

## 2015-07-18 DIAGNOSIS — N19 Unspecified kidney failure: Secondary | ICD-10-CM

## 2015-07-18 DIAGNOSIS — Z961 Presence of intraocular lens: Secondary | ICD-10-CM | POA: Diagnosis present

## 2015-07-18 DIAGNOSIS — I129 Hypertensive chronic kidney disease with stage 1 through stage 4 chronic kidney disease, or unspecified chronic kidney disease: Secondary | ICD-10-CM | POA: Diagnosis present

## 2015-07-18 DIAGNOSIS — Z853 Personal history of malignant neoplasm of breast: Secondary | ICD-10-CM

## 2015-07-18 DIAGNOSIS — E875 Hyperkalemia: Secondary | ICD-10-CM | POA: Diagnosis not present

## 2015-07-18 DIAGNOSIS — Z888 Allergy status to other drugs, medicaments and biological substances status: Secondary | ICD-10-CM | POA: Diagnosis not present

## 2015-07-18 DIAGNOSIS — N184 Chronic kidney disease, stage 4 (severe): Secondary | ICD-10-CM | POA: Diagnosis not present

## 2015-07-18 DIAGNOSIS — Z794 Long term (current) use of insulin: Secondary | ICD-10-CM | POA: Diagnosis not present

## 2015-07-18 DIAGNOSIS — Z87891 Personal history of nicotine dependence: Secondary | ICD-10-CM

## 2015-07-18 DIAGNOSIS — R339 Retention of urine, unspecified: Secondary | ICD-10-CM | POA: Diagnosis present

## 2015-07-18 LAB — I-STAT CHEM 8, ED
BUN: 42 mg/dL — AB (ref 6–20)
CALCIUM ION: 1.1 mmol/L — AB (ref 1.13–1.30)
CHLORIDE: 92 mmol/L — AB (ref 101–111)
CREATININE: 3.3 mg/dL — AB (ref 0.44–1.00)
Glucose, Bld: 649 mg/dL (ref 65–99)
HCT: 39 % (ref 36.0–46.0)
Hemoglobin: 13.3 g/dL (ref 12.0–15.0)
Potassium: 6.3 mmol/L (ref 3.5–5.1)
Sodium: 128 mmol/L — ABNORMAL LOW (ref 135–145)
TCO2: 25 mmol/L (ref 0–100)

## 2015-07-18 LAB — CBC WITH DIFFERENTIAL/PLATELET
BASOS PCT: 0 %
Basophils Absolute: 0 10*3/uL (ref 0.0–0.1)
EOS ABS: 0.2 10*3/uL (ref 0.0–0.7)
Eosinophils Relative: 3 %
HEMATOCRIT: 35 % — AB (ref 36.0–46.0)
HEMOGLOBIN: 11.6 g/dL — AB (ref 12.0–15.0)
LYMPHS ABS: 1.8 10*3/uL (ref 0.7–4.0)
Lymphocytes Relative: 31 %
MCH: 31 pg (ref 26.0–34.0)
MCHC: 33.1 g/dL (ref 30.0–36.0)
MCV: 93.6 fL (ref 78.0–100.0)
MONO ABS: 0.4 10*3/uL (ref 0.1–1.0)
MONOS PCT: 7 %
NEUTROS PCT: 59 %
Neutro Abs: 3.5 10*3/uL (ref 1.7–7.7)
Platelets: 209 10*3/uL (ref 150–400)
RBC: 3.74 MIL/uL — ABNORMAL LOW (ref 3.87–5.11)
RDW: 13.1 % (ref 11.5–15.5)
WBC: 5.9 10*3/uL (ref 4.0–10.5)

## 2015-07-18 LAB — URINALYSIS, ROUTINE W REFLEX MICROSCOPIC
BILIRUBIN URINE: NEGATIVE
HGB URINE DIPSTICK: NEGATIVE
KETONES UR: NEGATIVE mg/dL
Leukocytes, UA: NEGATIVE
Nitrite: NEGATIVE
PH: 5.5 (ref 5.0–8.0)
Protein, ur: NEGATIVE mg/dL
SPECIFIC GRAVITY, URINE: 1.022 (ref 1.005–1.030)

## 2015-07-18 LAB — GLUCOSE, CAPILLARY
GLUCOSE-CAPILLARY: 141 mg/dL — AB (ref 65–99)
GLUCOSE-CAPILLARY: 280 mg/dL — AB (ref 65–99)
GLUCOSE-CAPILLARY: 355 mg/dL — AB (ref 65–99)
Glucose-Capillary: 254 mg/dL — ABNORMAL HIGH (ref 65–99)
Glucose-Capillary: 231 mg/dL — ABNORMAL HIGH (ref 65–99)

## 2015-07-18 LAB — COMPREHENSIVE METABOLIC PANEL
ALBUMIN: 3.3 g/dL — AB (ref 3.5–5.0)
ALK PHOS: 227 U/L — AB (ref 38–126)
ALT: 10 U/L — ABNORMAL LOW (ref 14–54)
ANION GAP: 10 (ref 5–15)
AST: 16 U/L (ref 15–41)
BILIRUBIN TOTAL: 0.4 mg/dL (ref 0.3–1.2)
BUN: 47 mg/dL — ABNORMAL HIGH (ref 6–20)
CALCIUM: 8.4 mg/dL — AB (ref 8.9–10.3)
CO2: 24 mmol/L (ref 22–32)
Chloride: 93 mmol/L — ABNORMAL LOW (ref 101–111)
Creatinine, Ser: 3.53 mg/dL — ABNORMAL HIGH (ref 0.44–1.00)
GFR calc non Af Amer: 11 mL/min — ABNORMAL LOW (ref >60)
GFR, EST AFRICAN AMERICAN: 13 mL/min — AB (ref >60)
Glucose, Bld: 699 mg/dL (ref 65–99)
POTASSIUM: 6.4 mmol/L — AB (ref 3.5–5.1)
SODIUM: 127 mmol/L — AB (ref 135–145)
TOTAL PROTEIN: 7.8 g/dL (ref 6.5–8.1)

## 2015-07-18 LAB — BASIC METABOLIC PANEL
ANION GAP: 8 (ref 5–15)
BUN: 41 mg/dL — ABNORMAL HIGH (ref 6–20)
CALCIUM: 8.7 mg/dL — AB (ref 8.9–10.3)
CO2: 24 mmol/L (ref 22–32)
Chloride: 103 mmol/L (ref 101–111)
Creatinine, Ser: 3.06 mg/dL — ABNORMAL HIGH (ref 0.44–1.00)
GFR calc non Af Amer: 13 mL/min — ABNORMAL LOW (ref >60)
GFR, EST AFRICAN AMERICAN: 15 mL/min — AB (ref >60)
Glucose, Bld: 265 mg/dL — ABNORMAL HIGH (ref 65–99)
Potassium: 4.6 mmol/L (ref 3.5–5.1)
Sodium: 135 mmol/L (ref 135–145)

## 2015-07-18 LAB — URINE MICROSCOPIC-ADD ON
RBC / HPF: NONE SEEN RBC/hpf (ref 0–5)
SQUAMOUS EPITHELIAL / LPF: NONE SEEN

## 2015-07-18 LAB — CBG MONITORING, ED: Glucose-Capillary: 385 mg/dL — ABNORMAL HIGH (ref 65–99)

## 2015-07-18 MED ORDER — CALCIUM-VITAMIN D 600-200 MG-UNIT PO TABS: 1.0000 | ORAL_TABLET | Freq: Two times a day (BID) | ORAL | Status: DC

## 2015-07-18 MED ORDER — INSULIN ASPART 100 UNIT/ML ~~LOC~~ SOLN
0.0000 [IU] | Freq: Three times a day (TID) | SUBCUTANEOUS | Status: DC
  Administered 2015-07-18: 8 [IU] via SUBCUTANEOUS

## 2015-07-18 MED ORDER — ENOXAPARIN SODIUM 30 MG/0.3ML ~~LOC~~ SOLN
30.0000 mg | SUBCUTANEOUS | Status: DC
  Administered 2015-07-18: 30 mg via SUBCUTANEOUS
  Filled 2015-07-18: qty 0.3

## 2015-07-18 MED ORDER — PAROXETINE HCL 20 MG PO TABS
10.0000 mg | ORAL_TABLET | Freq: Every day | ORAL | Status: DC
  Administered 2015-07-18: 10 mg via ORAL
  Filled 2015-07-18: qty 1

## 2015-07-18 MED ORDER — ONDANSETRON HCL 4 MG/2ML IJ SOLN: 4.0000 mg | Freq: Four times a day (QID) | INTRAMUSCULAR | Status: DC | PRN

## 2015-07-18 MED ORDER — INSULIN ASPART 100 UNIT/ML ~~LOC~~ SOLN
0.0000 [IU] | Freq: Every day | SUBCUTANEOUS | Status: DC
  Administered 2015-07-18: 5 [IU] via SUBCUTANEOUS

## 2015-07-18 MED ORDER — SODIUM CHLORIDE 0.9 % IV SOLN
INTRAVENOUS | Status: DC
  Administered 2015-07-18: 23:00:00 via INTRAVENOUS
  Administered 2015-07-18: 75 mL/h via INTRAVENOUS

## 2015-07-18 MED ORDER — DONEPEZIL HCL 5 MG PO TABS
10.0000 mg | ORAL_TABLET | Freq: Every day | ORAL | Status: DC
  Administered 2015-07-18: 10 mg via ORAL
  Filled 2015-07-18: qty 2

## 2015-07-18 MED ORDER — PANTOPRAZOLE SODIUM 40 MG PO TBEC
40.0000 mg | DELAYED_RELEASE_TABLET | Freq: Every day | ORAL | Status: DC
  Administered 2015-07-18 – 2015-07-19 (×2): 40 mg via ORAL
  Filled 2015-07-18 (×2): qty 1

## 2015-07-18 MED ORDER — DEXTROSE-NACL 5-0.45 % IV SOLN
INTRAVENOUS | Status: DC
Start: 2015-07-18 — End: 2015-07-18

## 2015-07-18 MED ORDER — FERROUS SULFATE 325 (65 FE) MG PO TABS
325.0000 mg | ORAL_TABLET | Freq: Every day | ORAL | Status: DC
  Administered 2015-07-19: 325 mg via ORAL
  Filled 2015-07-18: qty 1

## 2015-07-18 MED ORDER — ATORVASTATIN CALCIUM 10 MG PO TABS
20.0000 mg | ORAL_TABLET | Freq: Every day | ORAL | Status: DC
  Administered 2015-07-18: 20 mg via ORAL
  Filled 2015-07-18 (×2): qty 2

## 2015-07-18 MED ORDER — INSULIN ASPART 100 UNIT/ML IV SOLN
10.0000 [IU] | Freq: Once | INTRAVENOUS | Status: AC
  Administered 2015-07-18: 10 [IU] via INTRAVENOUS
  Filled 2015-07-18: qty 0.1

## 2015-07-18 MED ORDER — ACETAMINOPHEN 650 MG RE SUPP: 650.0000 mg | Freq: Four times a day (QID) | RECTAL | Status: DC | PRN

## 2015-07-18 MED ORDER — DEXTROSE 50 % IV SOLN: 25.0000 mL | INTRAVENOUS | Status: DC | PRN

## 2015-07-18 MED ORDER — SODIUM CHLORIDE 0.9 % IV SOLN
1.0000 g | Freq: Once | INTRAVENOUS | Status: AC
  Administered 2015-07-18: 1 g via INTRAVENOUS
  Filled 2015-07-18: qty 10

## 2015-07-18 MED ORDER — CALCIUM CARBONATE-VITAMIN D 500-200 MG-UNIT PO TABS
1.0000 | ORAL_TABLET | Freq: Two times a day (BID) | ORAL | Status: DC
  Administered 2015-07-18 – 2015-07-19 (×2): 1 via ORAL
  Filled 2015-07-18 (×2): qty 1

## 2015-07-18 MED ORDER — SODIUM CHLORIDE 0.9 % IV SOLN
INTRAVENOUS | Status: DC
  Administered 2015-07-18: 3.3 [IU]/h via INTRAVENOUS
  Filled 2015-07-18: qty 2.5

## 2015-07-18 MED ORDER — ONDANSETRON HCL 4 MG PO TABS: 4.0000 mg | ORAL_TABLET | Freq: Four times a day (QID) | ORAL | Status: DC | PRN

## 2015-07-18 MED ORDER — ACETAMINOPHEN 325 MG PO TABS: 650.0000 mg | ORAL_TABLET | Freq: Four times a day (QID) | ORAL | Status: DC | PRN

## 2015-07-18 MED ORDER — SODIUM CHLORIDE 0.9 % IV BOLUS (SEPSIS)
1000.0000 mL | Freq: Once | INTRAVENOUS | Status: AC
  Administered 2015-07-18: 1000 mL via INTRAVENOUS

## 2015-07-18 MED ORDER — AMLODIPINE BESYLATE 10 MG PO TABS
10.0000 mg | ORAL_TABLET | Freq: Every day | ORAL | Status: DC
  Administered 2015-07-18 – 2015-07-19 (×2): 10 mg via ORAL
  Filled 2015-07-18 (×2): qty 1

## 2015-07-18 MED ORDER — INSULIN REGULAR BOLUS VIA INFUSION
0.0000 [IU] | Freq: Three times a day (TID) | INTRAVENOUS | Status: DC
  Filled 2015-07-18: qty 10

## 2015-07-18 MED ORDER — INSULIN DETEMIR 100 UNIT/ML ~~LOC~~ SOLN
25.0000 [IU] | Freq: Two times a day (BID) | SUBCUTANEOUS | Status: DC
  Administered 2015-07-18 (×2): 25 [IU] via SUBCUTANEOUS
  Filled 2015-07-18 (×3): qty 0.25

## 2015-07-18 MED ORDER — ACETAMINOPHEN 500 MG PO TABS: 500.0000 mg | ORAL_TABLET | Freq: Four times a day (QID) | ORAL | Status: DC | PRN

## 2015-07-18 MED ORDER — CLOPIDOGREL BISULFATE 75 MG PO TABS
75.0000 mg | ORAL_TABLET | Freq: Every day | ORAL | Status: DC
  Administered 2015-07-18 – 2015-07-19 (×2): 75 mg via ORAL
  Filled 2015-07-18 (×2): qty 1

## 2015-07-18 MED ORDER — ISOSORBIDE MONONITRATE ER 30 MG PO TB24
120.0000 mg | ORAL_TABLET | Freq: Every morning | ORAL | Status: DC
  Administered 2015-07-18 – 2015-07-19 (×2): 120 mg via ORAL
  Filled 2015-07-18 (×2): qty 4

## 2015-07-18 MED ORDER — PREGABALIN 75 MG PO CAPS
75.0000 mg | ORAL_CAPSULE | Freq: Two times a day (BID) | ORAL | Status: DC
  Administered 2015-07-18 – 2015-07-19 (×3): 75 mg via ORAL
  Filled 2015-07-18 (×3): qty 1

## 2015-07-18 NOTE — ED Notes (Signed)
Awake. Verbally responsive. A/O x2. Resp even and unlabored. No audible adventitious breath sounds noted. ABC's intact. IV infusing NS at 969ml/hr without difficulty. Family at bedside.

## 2015-07-18 NOTE — ED Notes (Signed)
Dr.and RN notified of pt's [id number] K-6.3

## 2015-07-18 NOTE — ED Notes (Signed)
Highest bladder scan reading obtained = 197 ml.

## 2015-07-18 NOTE — H&P (Signed)
[facility] History and Physical  [name] KPT:[id number] DOB: Jul 01, 1929 DOA: 07/18/2015  Referring physician: EDP PCP: [name], MD   Chief Complaint: didn't urinate yesterday  HPI: [name] is a 81 y.o. female with past medical history of Uncontrolled diabetes on insulin, Dementia, chronic kidney disease stage IV, CAD, hypertension, anemia of chronic disease presents to the ER today with the above complaints. Patient is a very poor historian due to dementia but she can tell me is that she didn't urinate yesterday and just doesn't feel too great today. She denies any fevers or chills. No cough congestion shortness of breath, no nausea vomiting or diarrhea. I called her daughter, she reports that she fills out her insulin syringes for her mother, and the patient administers it herself In the emergency room noted to have hyperkalemia and CBGs of 700 without DKA, [facility] consulted  Review of Systems: Unable to obtain due to dementia Constitutional:  No weight loss, night sweats, Fevers, chills, fatigue.  HEENT:  No headaches, Difficulty swallowing,Tooth/dental problems,Sore throat,  No sneezing, itching, ear ache, nasal congestion, post nasal drip,  Cardio-vascular:  No chest pain, Orthopnea, PND, swelling in lower extremities, anasarca, dizziness, palpitations  GI:  No heartburn, indigestion, abdominal pain, nausea, vomiting, diarrhea, change in bowel habits, loss of appetite  Resp:  No shortness of breath with exertion or at rest. No excess mucus, no productive cough, No non-productive cough, No coughing up of blood.No change in color of mucus.No wheezing.No chest wall deformity  Skin:  no rash or lesions.  GU:  no dysuria, change in color of urine, no urgency or frequency. No flank pain.  Musculoskeletal:  No joint pain or swelling. No decreased range of motion. No back pain.  Psych:  No change in mood or affect. No depression or anxiety. No memory loss.   Past  Medical History  Diagnosis Date  . 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 ANEMIA   . DEPRESSION   . HYPERTENSION   . PVD   . INSOMNIA, CHRONIC   . Dementia   . DIABETES MELLITUS, TYPE II     renal, neuro - peripheral neuropathy  . Hypercholesteremia   . Chronic kidney disease (CKD), stage IV (severe) ([place])   . Anemia of chronic disease   . Angina   . Non-Q wave myocardial infarction ([place]) 10/2009    /E-chart  . Inferior MI ([place]) 2000    "quiet"  . Breast cancer ([place]) 2000    right  . Loose stools   . Neurodermatitis    Past Surgical History  Procedure Laterality Date  . Capsulectomy  12/27/2010    L eye  . Breast lumpectomy  2000    right  . Cataract extraction w/ intraocular lens  implant, bilateral    . Coronary artery bypass graft  2000    CABG X1  . Coronary angioplasty with stent placement      "twice"  . Eye surgery      Cataracts (R) eye 06/1999 & (L) eye 09/1999- [place]  . Combined hysteroscopy diagnostic / d&c  07/2004    /e-chart  . Dilation and curettage of uterus    . Cardiac surgery    . Left heart catheterization with coronary angiogram N/A 11/22/2011    Procedure: LEFT HEART CATHETERIZATION WITH CORONARY ANGIOGRAM;  Surgeon: Clent Demark, MD;  Location: [facility] CATH LAB;  Service: Cardiovascular;  Laterality: N/A;  . Left heart catheterization with coronary angiogram N/A  10/03/2012    Procedure: LEFT HEART CATHETERIZATION WITH CORONARY ANGIOGRAM;  Surgeon: Clent Demark, MD;  Location: [facility] CATH LAB;  Service: Cardiovascular;  Laterality: N/A;   Social History:  reports that she has quit smoking. Her smoking use included Cigarettes. She has a 20 pack-year smoking history. She has never used smokeless tobacco. She reports that she does not drink alcohol or use illicit drugs.  Allergies  Allergen Reactions  . Ace Inhibitors Cough    Family History  Problem Relation Age of Onset  . Coronary artery disease Father     Prior to Admission medications    Medication Sig Start Date End Date Taking? Authorizing Provider  acetaminophen (TYLENOL) 500 MG tablet Take 1,000 mg by mouth every 6 (six) hours as needed for mild pain.   Yes Historical Provider, MD  atorvastatin (LIPITOR) 20 MG tablet TAKE 1 TABLET BY MOUTH ONCE DAILY 05/05/15  Yes Hoyt Koch, MD  Calcium-Vitamin D 600-200 MG-UNIT tablet Take 1 tablet by mouth 2 (two) times daily.   Yes Historical Provider, MD  clopidogrel (PLAVIX) 75 MG tablet Take 1 tablet (75 mg total) by mouth daily. 12/29/14  Yes Rowe Clack, MD  donepezil (ARICEPT) 10 MG tablet Take 1 tablet (10 mg total) by mouth at bedtime. Must establish with NEW PCP for additional refills. 07/10/15  Yes Rowe Clack, MD  epoetin alfa (EPOGEN,PROCRIT) 51884 UNIT/ML injection Inject 10,000 Units into the skin every 28 (twenty-eight) days.    Yes Historical Provider, MD  ferrous sulfate 325 (65 FE) MG tablet Take 325 mg by mouth daily with breakfast.   Yes Historical Provider, MD  furosemide (LASIX) 40 MG tablet TAKE ONE TABLET BY MOUTH TWICE DAILY 05/05/15  Yes Hoyt Koch, MD  Insulin Detemir (LEVEMIR FLEXTOUCH) 100 UNIT/ML Pen Inject 20 Units into the skin every morning. Patient taking differently: Inject 22-26 Units into the skin 2 (two) times daily. Pt takes 26 units in the am and 22 units at bedtime. 07/03/15  Yes Philemon Kingdom, MD  insulin regular (HUMULIN R) 100 units/mL injection Inject 0.05 mLs (5 Units total) into the skin 3 (three) times daily before meals. Patient taking differently: Inject 8 Units into the skin 3 (three) times daily before meals.  07/10/15  Yes Philemon Kingdom, MD  isosorbide mononitrate (IMDUR) 120 MG 24 hr tablet Take 120 mg by mouth every morning. 03/27/15  Yes Historical Provider, MD  LYRICA 75 MG capsule TAKE 1 CAPSULE BY MOUTH TWICE DAILY 05/05/15  Yes Hoyt Koch, MD  pantoprazole (PROTONIX) 40 MG tablet Take 1 tablet (40 mg total) by mouth daily at 6 (six) AM.  10/04/12  Yes Charolette Forward, MD  PARoxetine (PAXIL) 10 MG tablet Take 1 tablet (10 mg total) by mouth at bedtime. Must establish with NEW PCP for additional refills. 07/10/15  Yes Rowe Clack, MD  traMADol (ULTRAM) 50 MG tablet Take 1 tablet (50 mg total) by mouth every 8 (eight) hours as needed. Patient taking differently: Take 50 mg by mouth every 8 (eight) hours as needed. For pain 12/05/13  Yes Hendricks Limes, MD  triamcinolone cream (KENALOG) 0.1 % APPLY TOPICALLY TWICE DAILY 06/15/15  Yes Rowe Clack, MD  amLODipine (NORVASC) 5 MG tablet Take 1 tablet (5 mg total) by mouth daily. Patient not taking: Reported on 07/18/2015 12/29/14   Rowe Clack, MD  aspirin 81 MG tablet Take 1 tablet (81 mg total) by mouth daily. Patient not taking: Reported on 07/18/2015  11/23/11   Charolette Forward, MD  Insulin Pen Needle (EASY TOUCH PEN NEEDLES) 31G X 8 MM MISC Inject 1 pen into the skin daily. 10/27/14   Rowe Clack, MD  Insulin Syringe-Needle U-100 (INSULIN SYRINGE 1CC/28G) 28G X 1/2" 1 ML MISC Use to check blood sugar three times daily ICD 10 E11 29 11/28/14   Hendricks Limes, MD   Physical Exam: Filed Vitals:   07/18/15 0730 07/18/15 0800 07/18/15 0900 07/18/15 1003  BP: 175/73 165/70 177/87 189/63  Pulse: 55 51 55 51  Temp:    97.6 F (36.4 C)  TempSrc:    Oral  Resp: 18  17 18   Height:    5' 3"  (1.6 m)  Weight:    70.353 kg (155 lb 1.6 oz)  SpO2: 96% 96% 100% 100%    Wt Readings from Last 3 Encounters:  07/18/15 70.353 kg (155 lb 1.6 oz)  06/29/15 70.217 kg (154 lb 12.8 oz)  06/25/15 65.772 kg (145 lb)    General:  Appears calm and comfortable, alert awake oriented to self and place Eyes: PERRL, normal lids, irises & conjunctiva ENT: grossly normal hearing, lips & tongue Neck: no LAD, masses or thyromegaly Cardiovascular: RRR, no m/r/g. No LE edema. Telemetry: SR, no arrhythmias  Respiratory: CTA bilaterally, no w/r/r. Normal respiratory effort. Abdomen: soft,  ntnd Skin: no rash or induration seen on limited exam Musculoskeletal: grossly normal tone BUE/BLE Psychiatric: grossly normal mood and affect, speech fluent and appropriate Neurologic: grossly non-focal.          Labs on Admission:  Basic Metabolic Panel:  Recent Labs Lab 07/18/15 0741 07/18/15 0801 07/18/15 1125  NA 128* 127* 135  K 6.3* 6.4* 4.6  CL 92* 93* 103  CO2  --  24 24  GLUCOSE 649* 699* 265*  BUN 42* 47* 41*  CREATININE 3.30* 3.53* 3.06*  CALCIUM  --  8.4* 8.7*   Liver Function Tests:  Recent Labs Lab 07/18/15 0801  AST 16  ALT 10*  ALKPHOS 227*  BILITOT 0.4  PROT 7.8  ALBUMIN 3.3*   No results for input(s): LIPASE, AMYLASE in the last 168 hours. No results for input(s): AMMONIA in the last 168 hours. CBC:  Recent Labs Lab 07/18/15 0741 07/18/15 0801  WBC  --  5.9  NEUTROABS  --  3.5  HGB 13.3 11.6*  HCT 39.0 35.0*  MCV  --  93.6  PLT  --  209   Cardiac Enzymes: No results for input(s): CKTOTAL, CKMB, CKMBINDEX, TROPONINI in the last 168 hours.  BNP (last 3 results) No results for input(s): BNP in the last 8760 hours.  ProBNP (last 3 results) No results for input(s): PROBNP in the last 8760 hours.  CBG:  Recent Labs Lab 07/18/15 0934 07/18/15 1054 07/18/15 1155 07/18/15 1255  GLUCAP 385* 254* 231* 141*    Radiological Exams on Admission: No results found.  EKG: Independently reviewed. Sinus rhythm, intraventricular conduction delay  Assessment/Plan   Hyperkalemia -Likely due to renal insufficiency and volume depletion -Given calcium gluconate -Expect this to improve with insulindrip that she is just started on -Repeat B met this afternoon    Uncontrolled type 2 diabetes with renal manifestation ([place]) -Without diabetic ketoacidosis -With dehydration, CBGs and 700 range -Insulin drip per Glucomander protocol, 1 CBGs less than 200 transitioned back to Levemir 0.5 units twice a day and sliding scale    Dementia -May  need more supervision especially with her medicines to enforce compliance -continue aricept,  paxil    CKD (chronic kidney disease) stage 4, GFR 15-29 ml/min (HCC) -Creatinine stable at baseline followed by Dr. Justin Mend at [facility]    CAD -stable, resume ASA, plavix, statin   HTN -resume amlodipine, imdur  DVT proph: lovenox  Code Status: DNR DVT Prophylaxis:lovenox Family Communication: none at bedside, called and d/w daughter Disposition Plan: inpatient)  Time spent: 86mn  Darianna Amy [facility] Pager [phone]

## 2015-07-18 NOTE — ED Notes (Signed)
Labs relayed to [place], RN at this time: Glucose 699 and K+ of 6.4.

## 2015-07-18 NOTE — ED Notes (Signed)
Pt reported having urinary retention. Incontinent of bladder. Denies abd pain/discomfort.

## 2015-07-18 NOTE — ED Notes (Signed)
Awake. Verbally responsive. A/O x4. Resp even and unlabored. No audible adventitious breath sounds noted. ABC's intact. SR on monitor. IV saline lock patent and intact. Family at bedside. 

## 2015-07-18 NOTE — ED Provider Notes (Signed)
CSN: [id number]     Arrival date & time 07/18/15  [id number] History   First MD Initiated Contact with Patient 07/18/15 [phone]     Chief Complaint  Patient presents with  . Urinary Retention     (Consider location/radiation/quality/duration/timing/severity/associated sxs/prior Treatment) HPI Patient presents with decreased urine production for one day. States she last urinated on Thursday. Also admits to drinking less fluids. Denies any flank or abdominal pain. No fever or chills. No new medication. Past Medical History  Diagnosis Date  . 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 ANEMIA   . DEPRESSION   . HYPERTENSION   . PVD   . INSOMNIA, CHRONIC   . Dementia   . DIABETES MELLITUS, TYPE II     renal, neuro - peripheral neuropathy  . Hypercholesteremia   . Chronic kidney disease (CKD), stage IV (severe) ([place])   . Anemia of chronic disease   . Angina   . Non-Q wave myocardial infarction ([place]) 10/2009    /E-chart  . Inferior MI ([place]) 2000    "quiet"  . Breast cancer ([place]) 2000    right  . Loose stools   . Neurodermatitis    Past Surgical History  Procedure Laterality Date  . Capsulectomy  12/27/2010    L eye  . Breast lumpectomy  2000    right  . Cataract extraction w/ intraocular lens  implant, bilateral    . Coronary artery bypass graft  2000    CABG X1  . Coronary angioplasty with stent placement      "twice"  . Eye surgery      Cataracts (R) eye 06/1999 & (L) eye 09/1999- [place]  . Combined hysteroscopy diagnostic / d&c  07/2004    /e-chart  . Dilation and curettage of uterus    . Cardiac surgery    . Left heart catheterization with coronary angiogram N/A 11/22/2011    Procedure: LEFT HEART CATHETERIZATION WITH CORONARY ANGIOGRAM;  Surgeon: Clent Demark, MD;  Location: [facility] CATH LAB;  Service: Cardiovascular;  Laterality: N/A;  . Left heart catheterization with coronary angiogram N/A 10/03/2012    Procedure: LEFT HEART CATHETERIZATION WITH CORONARY ANGIOGRAM;   Surgeon: Clent Demark, MD;  Location: [facility] CATH LAB;  Service: Cardiovascular;  Laterality: N/A;   Family History  Problem Relation Age of Onset  . Coronary artery disease Father    Social History  Substance Use Topics  . Smoking status: Former Smoker -- 1.00 packs/day for 20 years    Types: Cigarettes  . Smokeless tobacco: Never Used     Comment: 11/21/11 "quit smoking 30-40 years ago  . Alcohol Use: No     Comment: 11/21/11 "used to drink quit alot; last drink of alcohol maybe 30 years ago"   OB History    No data available     Review of Systems  Constitutional: Negative for fever and chills.  Gastrointestinal: Negative for nausea, vomiting, abdominal pain, diarrhea and constipation.  Genitourinary: Positive for decreased urine volume. Negative for dysuria, frequency, hematuria, flank pain and difficulty urinating.  Musculoskeletal: Negative for back pain.  Skin: Negative for rash.  Neurological: Negative for dizziness, weakness, light-headedness and numbness.  All other systems reviewed and are negative.     Allergies  Ace inhibitors  Home Medications   Prior to Admission medications   Medication Sig Start Date End Date Taking? Authorizing Provider  acetaminophen (TYLENOL) 500 MG tablet Take 1,000 mg by mouth every 6 (six) hours  as needed for mild pain.   Yes Historical Provider, MD  atorvastatin (LIPITOR) 20 MG tablet TAKE 1 TABLET BY MOUTH ONCE DAILY 05/05/15  Yes Hoyt Koch, MD  Calcium-Vitamin D 600-200 MG-UNIT tablet Take 1 tablet by mouth 2 (two) times daily.   Yes Historical Provider, MD  clopidogrel (PLAVIX) 75 MG tablet Take 1 tablet (75 mg total) by mouth daily. 12/29/14  Yes Rowe Clack, MD  donepezil (ARICEPT) 10 MG tablet Take 1 tablet (10 mg total) by mouth at bedtime. Must establish with NEW PCP for additional refills. 07/10/15  Yes Rowe Clack, MD  epoetin alfa (EPOGEN,PROCRIT) 16109 UNIT/ML injection Inject 10,000 Units into the skin  every 28 (twenty-eight) days.    Yes Historical Provider, MD  ferrous sulfate 325 (65 FE) MG tablet Take 325 mg by mouth daily with breakfast.   Yes Historical Provider, MD  furosemide (LASIX) 40 MG tablet TAKE ONE TABLET BY MOUTH TWICE DAILY 05/05/15  Yes Hoyt Koch, MD  Insulin Detemir (LEVEMIR FLEXTOUCH) 100 UNIT/ML Pen Inject 20 Units into the skin every morning. Patient taking differently: Inject 22-26 Units into the skin 2 (two) times daily. Pt takes 26 units in the am and 22 units at bedtime. 07/03/15  Yes Philemon Kingdom, MD  insulin regular (HUMULIN R) 100 units/mL injection Inject 0.05 mLs (5 Units total) into the skin 3 (three) times daily before meals. Patient taking differently: Inject 8 Units into the skin 3 (three) times daily before meals.  07/10/15  Yes Philemon Kingdom, MD  isosorbide mononitrate (IMDUR) 120 MG 24 hr tablet Take 120 mg by mouth every morning. 03/27/15  Yes Historical Provider, MD  LYRICA 75 MG capsule TAKE 1 CAPSULE BY MOUTH TWICE DAILY 05/05/15  Yes Hoyt Koch, MD  pantoprazole (PROTONIX) 40 MG tablet Take 1 tablet (40 mg total) by mouth daily at 6 (six) AM. 10/04/12  Yes Charolette Forward, MD  PARoxetine (PAXIL) 10 MG tablet Take 1 tablet (10 mg total) by mouth at bedtime. Must establish with NEW PCP for additional refills. 07/10/15  Yes Rowe Clack, MD  traMADol (ULTRAM) 50 MG tablet Take 1 tablet (50 mg total) by mouth every 8 (eight) hours as needed. Patient taking differently: Take 50 mg by mouth every 8 (eight) hours as needed. For pain 12/05/13  Yes Hendricks Limes, MD  triamcinolone cream (KENALOG) 0.1 % APPLY TOPICALLY TWICE DAILY 06/15/15  Yes Rowe Clack, MD  amLODipine (NORVASC) 5 MG tablet Take 1 tablet (5 mg total) by mouth daily. Patient not taking: Reported on 07/18/2015 12/29/14   Rowe Clack, MD  aspirin 81 MG tablet Take 1 tablet (81 mg total) by mouth daily. Patient not taking: Reported on 07/18/2015 11/23/11    Charolette Forward, MD  Insulin Pen Needle (EASY TOUCH PEN NEEDLES) 31G X 8 MM MISC Inject 1 pen into the skin daily. 10/27/14   Rowe Clack, MD  Insulin Syringe-Needle U-100 (INSULIN SYRINGE 1CC/28G) 28G X 1/2" 1 ML MISC Use to check blood sugar three times daily ICD 10 E11 29 11/28/14   Hendricks Limes, MD   BP 165/70 mmHg  Pulse 51  Temp(Src) 97.8 F (36.6 C) (Oral)  Resp 18  SpO2 96% Physical Exam  Constitutional: She is oriented to person, place, and time. She appears well-developed and well-nourished. No distress.  HENT:  Head: Normocephalic and atraumatic.  Mouth/Throat: Oropharynx is clear and moist.  Eyes: EOM are normal. Pupils are equal, round, and reactive  to light.  Neck: Normal range of motion. Neck supple.  Cardiovascular: Normal rate and regular rhythm.   Pulmonary/Chest: Effort normal and breath sounds normal. No respiratory distress. She has no wheezes. She has no rales. She exhibits no tenderness.  Abdominal: Soft. Bowel sounds are normal. She exhibits no distension and no mass. There is no tenderness. There is no rebound and no guarding.  Musculoskeletal: Normal range of motion. She exhibits no edema or tenderness.  No CVA tenderness bilaterally  Neurological: She is alert and oriented to person, place, and time.  Skin: Skin is warm and dry. No rash noted. No erythema.  Psychiatric: She has a normal mood and affect. Her behavior is normal.  Nursing note and vitals reviewed.   ED Course  Procedures (including critical care time) Labs Review Labs Reviewed  URINALYSIS, ROUTINE W REFLEX MICROSCOPIC (NOT AT [facility]) - Abnormal; Notable for the following:    Glucose, UA >1000 (*)    All other components within normal limits  CBC WITH DIFFERENTIAL/PLATELET - Abnormal; Notable for the following:    RBC 3.74 (*)    Hemoglobin 11.6 (*)    HCT 35.0 (*)    All other components within normal limits  COMPREHENSIVE METABOLIC PANEL - Abnormal; Notable for the following:     Sodium 127 (*)    Potassium 6.4 (*)    Chloride 93 (*)    Glucose, Bld 699 (*)    BUN 47 (*)    Creatinine, Ser 3.53 (*)    Calcium 8.4 (*)    Albumin 3.3 (*)    ALT 10 (*)    Alkaline Phosphatase 227 (*)    GFR calc non Af Amer 11 (*)    GFR calc Af Amer 13 (*)    All other components within normal limits  URINE MICROSCOPIC-ADD ON - Abnormal; Notable for the following:    Bacteria, UA RARE (*)    All other components within normal limits  I-STAT CHEM 8, ED - Abnormal; Notable for the following:    Sodium 128 (*)    Potassium 6.3 (*)    Chloride 92 (*)    BUN 42 (*)    Creatinine, Ser 3.30 (*)    Glucose, Bld 649 (*)    Calcium, Ion 1.10 (*)    All other components within normal limits    Imaging Review No results found. I have personally reviewed and evaluated these images and lab results as part of my medical decision-making.   EKG Interpretation   Date/Time:  Saturday July 18 2015 08:50:46 EST Ventricular Rate:  57 PR Interval:  160 QRS Duration: 119 QT Interval:  479 QTC Calculation: 466 R Axis:   -2 Text Interpretation:  Sinus rhythm Nonspecific intraventricular conduction  delay Low voltage, precordial leads Confirmed by Lita Mains  MD, Aleczander Fandino  ([id number]) on 07/18/2015 9:11:25 AM      MDM   Final diagnoses:  Hyperglycemia  Hyperkalemia  Renal failure    Patient is very well-appearing. Benign abdominal exam. UA and renal function pending.   Patient with significantly elevated blood glucose and potassium level. No new EKG changes. Vital signs remained stable. Started on IV insulin, fluids and calcium. Discussed with hospitalist and we'll admit to telemetry bed.  Julianne Rice, MD 07/18/15 [phone]

## 2015-07-18 NOTE — ED Notes (Signed)
Pt presents to ED from home with c/o urinary retention.   Pt reports that she has had her last urination last Thursday evening; stated, "I haven't urinated the whole day yesterday".  Pt denies abdominal pain or any other symptoms.

## 2015-07-19 DIAGNOSIS — Z794 Long term (current) use of insulin: Secondary | ICD-10-CM

## 2015-07-19 DIAGNOSIS — E1122 Type 2 diabetes mellitus with diabetic chronic kidney disease: Secondary | ICD-10-CM | POA: Insufficient documentation

## 2015-07-19 DIAGNOSIS — N184 Chronic kidney disease, stage 4 (severe): Secondary | ICD-10-CM

## 2015-07-19 DIAGNOSIS — E1165 Type 2 diabetes mellitus with hyperglycemia: Secondary | ICD-10-CM

## 2015-07-19 DIAGNOSIS — R739 Hyperglycemia, unspecified: Secondary | ICD-10-CM

## 2015-07-19 DIAGNOSIS — IMO0002 Reserved for concepts with insufficient information to code with codable children: Secondary | ICD-10-CM | POA: Insufficient documentation

## 2015-07-19 LAB — BASIC METABOLIC PANEL
Anion gap: 9 (ref 5–15)
BUN: 38 mg/dL — AB (ref 6–20)
CO2: 22 mmol/L (ref 22–32)
CREATININE: 2.66 mg/dL — AB (ref 0.44–1.00)
Calcium: 8.4 mg/dL — ABNORMAL LOW (ref 8.9–10.3)
Chloride: 106 mmol/L (ref 101–111)
GFR calc Af Amer: 18 mL/min — ABNORMAL LOW (ref >60)
GFR, EST NON AFRICAN AMERICAN: 15 mL/min — AB (ref >60)
GLUCOSE: 74 mg/dL (ref 65–99)
POTASSIUM: 4.8 mmol/L (ref 3.5–5.1)
Sodium: 137 mmol/L (ref 135–145)

## 2015-07-19 LAB — CBC
HCT: 30.2 % — ABNORMAL LOW (ref 36.0–46.0)
Hemoglobin: 10.2 g/dL — ABNORMAL LOW (ref 12.0–15.0)
MCH: 30.8 pg (ref 26.0–34.0)
MCHC: 33.8 g/dL (ref 30.0–36.0)
MCV: 91.2 fL (ref 78.0–100.0)
PLATELETS: 181 10*3/uL (ref 150–400)
RBC: 3.31 MIL/uL — AB (ref 3.87–5.11)
RDW: 13.2 % (ref 11.5–15.5)
WBC: 7.1 10*3/uL (ref 4.0–10.5)

## 2015-07-19 LAB — GLUCOSE, CAPILLARY: Glucose-Capillary: 68 mg/dL (ref 65–99)

## 2015-07-19 MED ORDER — INSULIN DETEMIR 100 UNIT/ML ~~LOC~~ SOLN
20.0000 [IU] | Freq: Two times a day (BID) | SUBCUTANEOUS | Status: DC
  Administered 2015-07-19: 20 [IU] via SUBCUTANEOUS
  Filled 2015-07-19: qty 0.2

## 2015-07-19 MED ORDER — INSULIN REGULAR HUMAN 100 UNIT/ML IJ SOLN: 5.0000 [IU] | Freq: Three times a day (TID) | INTRAMUSCULAR | Status: DC

## 2015-07-19 MED ORDER — INSULIN DETEMIR 100 UNIT/ML FLEXPEN: 22.0000 [IU] | PEN_INJECTOR | Freq: Two times a day (BID) | SUBCUTANEOUS | Status: DC

## 2015-07-19 NOTE — Progress Notes (Signed)
Writer is taking over care of patient, agree with previous RN assessment, patient denies any needs at this time. Will continue to monitor.

## 2015-07-21 ENCOUNTER — Telehealth: Payer: Self-pay

## 2015-07-21 LAB — HEMOGLOBIN A1C
HEMOGLOBIN A1C: 18 % — AB (ref 4.8–5.6)
MEAN PLASMA GLUCOSE: 470 mg/dL

## 2015-07-21 NOTE — Discharge Summary (Signed)
Physician Discharge Summary  Rolena Sappenfield [id number] DOB: Jul 17, 1929 DOA: 07/18/2015  PCP: [name], MD  Admit date: 07/18/2015 Discharge date: 07/19/2015  Time spent: 45 minutes  Recommendations for Outpatient Follow-up:  1. Needs quick FU with PCP, may need ALF or alternate living arrangement if unable to manage DM appropriately, unable to make FU since office closed for Christmas holidays 2. Dr.Webb in 2-3weeks   Discharge Diagnoses:  Active Problems:   Uncontrolled type 2 diabetes with renal manifestation (HCC)   Dementia   CKD (chronic kidney disease) stage 4, GFR 15-29 ml/min (HCC)   Hyperglycemia   Hyperkalemia   Uncontrolled type 2 diabetes mellitus with stage 4 chronic kidney disease, with long-term current use of insulin ([place])   Discharge Condition: stable  Diet recommendation: Diabetic, low sodium  Filed Weights   07/18/15 1003  Weight: 70.353 kg (155 lb 1.6 oz)    History of present illness:  Chief Complaint: didn't urinate yesterday HPI: [name] is a 81 y.o. female with past medical history of Uncontrolled diabetes on insulin, Dementia, chronic kidney disease stage IV, CAD, hypertension, anemia of chronic disease presented to the ER 12/24 with the above complaints. Patient is a very poor historian due to dementia but she can tell me is that she didn't urinate 12/23 and just doesn't feel too great. She denied any fevers or chills. No cough congestion shortness of breath, no nausea vomiting or diarrhea. I called her daughter, she reported that she fills out her insulin syringes for her mother, and the patient administers it herself In the emergency room noted to have hyperkalemia and CBGs of 700 without DKA  Hospital Course:   Hyperkalemia -due to renal insufficiency and volume depletion -Given calcium gluconate -Improved with Insulin    Uncontrolled type 2 diabetes with renal manifestation (HCC) -Without diabetic ketoacidosis -With  dehydration, CBGs and 700 range on admission -unable to tell us with certainty if she missed any insulin doses in last 24hours. -Started on Insulin drip per Glucomander protocol and IVF, once CBGs were less than 200 transitioned back to Levemir22 units twice a day and sliding scale. -discussed with patient, son and daughter and advised compliance and need to consider alternate living arrangement, since today is christmas they are adamant to be discharged home   Dementia -Needs more supervision especially with her medicines to enforce compliance, discussed with son and daughter today -continue aricept, paxil -i would suspect that she would not be a good Dialysis candidate due to age/dementia, FU with Dr.Webb   CKD (chronic kidney disease) stage 4, GFR 15-29 ml/min (HCC) -Creatinine stable at baseline followed by Dr. Justin Mend at [facility]  CAD -stable, resumed ASA, plavix, statin  HTN -stable, resumed amlodipine, imdur  Discharge Exam: Filed Vitals:   07/18/15 2315 07/19/15 0615  BP: 133/53 137/50  Pulse: 63 66  Temp: 98.1 F (36.7 C) 98.4 F (36.9 C)  Resp: 18 18    General: AAOx2 Cardiovascular: S1S2/RRR Respiratory: CTAB  Discharge Instructions   Discharge Instructions    Diet - low sodium heart healthy    Complete by:  As directed      Diet Carb Modified    Complete by:  As directed      Increase activity slowly    Complete by:  As directed           Discharge Medication List as of 07/19/2015  9:50 AM    CONTINUE these medications which have CHANGED   Details  Insulin Detemir (LEVEMIR  FLEXTOUCH) 100 UNIT/ML Pen Inject 22 Units into the skin 2 (two) times daily between meals., Starting 07/19/2015, Until Discontinued, No Print    insulin regular (HUMULIN R) 100 units/mL injection Inject 0.05 mLs (5 Units total) into the skin 3 (three) times daily before meals., Starting 07/19/2015, Until Discontinued, No Print      CONTINUE these medications which have  NOT CHANGED   Details  acetaminophen (TYLENOL) 500 MG tablet Take 1,000 mg by mouth every 6 (six) hours as needed for mild pain., Until Discontinued, Historical Med    atorvastatin (LIPITOR) 20 MG tablet TAKE 1 TABLET BY MOUTH ONCE DAILY, Normal    Calcium-Vitamin D 600-200 MG-UNIT tablet Take 1 tablet by mouth 2 (two) times daily., Until Discontinued, Historical Med    clopidogrel (PLAVIX) 75 MG tablet Take 1 tablet (75 mg total) by mouth daily., Starting 12/29/2014, Until Discontinued, Normal    donepezil (ARICEPT) 10 MG tablet Take 1 tablet (10 mg total) by mouth at bedtime. Must establish with NEW PCP for additional refills., Starting 07/10/2015, Until Discontinued, Normal    epoetin alfa (EPOGEN,PROCRIT) 29562 UNIT/ML injection Inject 10,000 Units into the skin every 28 (twenty-eight) days. , Until Discontinued, Historical Med    ferrous sulfate 325 (65 FE) MG tablet Take 325 mg by mouth daily with breakfast., Until Discontinued, Historical Med    furosemide (LASIX) 40 MG tablet TAKE ONE TABLET BY MOUTH TWICE DAILY, Normal    isosorbide mononitrate (IMDUR) 120 MG 24 hr tablet Take 120 mg by mouth every morning., Starting 03/27/2015, Until Discontinued, Historical Med    LYRICA 75 MG capsule TAKE 1 CAPSULE BY MOUTH TWICE DAILY, Print    pantoprazole (PROTONIX) 40 MG tablet Take 1 tablet (40 mg total) by mouth daily at 6 (six) AM., Starting 10/04/2012, Until Discontinued, Normal    PARoxetine (PAXIL) 10 MG tablet Take 1 tablet (10 mg total) by mouth at bedtime. Must establish with NEW PCP for additional refills., Starting 07/10/2015, Until Discontinued, Normal    traMADol (ULTRAM) 50 MG tablet Take 1 tablet (50 mg total) by mouth every 8 (eight) hours as needed., Starting 12/05/2013, Until Discontinued, Print    triamcinolone cream (KENALOG) 0.1 % APPLY TOPICALLY TWICE DAILY, Normal    amLODipine (NORVASC) 5 MG tablet Take 1 tablet (5 mg total) by mouth daily., Starting 12/29/2014, Until  Discontinued, Normal    aspirin 81 MG tablet Take 1 tablet (81 mg total) by mouth daily., Starting 11/23/2011, Until Discontinued, Print    Insulin Pen Needle (EASY TOUCH PEN NEEDLES) 31G X 8 MM MISC Inject 1 pen into the skin daily., Starting 10/27/2014, Until Discontinued, Normal    Insulin Syringe-Needle U-100 (INSULIN SYRINGE 1CC/28G) 28G X 1/2" 1 ML MISC Use to check blood sugar three times daily ICD 10 E11 29, Normal       Allergies  Allergen Reactions  . Ace Inhibitors Cough   Follow-up Information    Follow up with [name], MD. Schedule an appointment as soon as possible for a visit in 1 day.   Specialty:  Internal Medicine   Contact information:   [address] [place] [address] [phone]       Follow up with Sherril Croon, MD. Schedule an appointment as soon as possible for a visit in 1 month.   Specialty:  Nephrology   Contact information:   [place] [address] [phone]        The results of  significant diagnostics from this hospitalization (including imaging, microbiology, ancillary and laboratory) are listed below for reference.    Significant Diagnostic Studies: No results found.  Microbiology: No results found for this or any previous visit (from the past 240 hour(s)).   Labs: Basic Metabolic Panel:  Recent Labs Lab 07/18/15 0741 07/18/15 0801 07/18/15 1125 07/19/15 0510  NA 128* 127* 135 137  K 6.3* 6.4* 4.6 4.8  CL 92* 93* 103 106  CO2  --  24 24 22   GLUCOSE 649* 699* 265* 74  BUN 42* 47* 41* 38*  CREATININE 3.30* 3.53* 3.06* 2.66*  CALCIUM  --  8.4* 8.7* 8.4*   Liver Function Tests:  Recent Labs Lab 07/18/15 0801  AST 16  ALT 10*  ALKPHOS 227*  BILITOT 0.4  PROT 7.8  ALBUMIN 3.3*   No results for input(s): LIPASE, AMYLASE in the last 168 hours. No results for input(s): AMMONIA in the last 168 hours. CBC:  Recent Labs Lab 07/18/15 0741 07/18/15 0801 07/19/15 0510   WBC  --  5.9 7.1  NEUTROABS  --  3.5  --   HGB 13.3 11.6* 10.2*  HCT 39.0 35.0* 30.2*  MCV  --  93.6 91.2  PLT  --  209 181   Cardiac Enzymes: No results for input(s): CKTOTAL, CKMB, CKMBINDEX, TROPONINI in the last 168 hours. BNP: BNP (last 3 results) No results for input(s): BNP in the last 8760 hours.  ProBNP (last 3 results) No results for input(s): PROBNP in the last 8760 hours.  CBG:  Recent Labs Lab 07/18/15 1155 07/18/15 1255 07/18/15 1731 07/18/15 2313 07/19/15 0742  GLUCAP 231* 141* 280* 355* 68       Signed:  Roshonda Sperl MD  FACP  [facility] 07/21/2015, 5:12 PM

## 2015-07-21 NOTE — Telephone Encounter (Signed)
TCM list 07/21/15 - admitted for urinary retention. Spoke to pt dtr [employer].   Transition Care Management Follow-up Telephone Call   Date discharged? 07/19/2015  How have you been since you were released from the hospital? Going well at home.   Do you understand why you were in the hospital? Yes  Do you understand the discharge instructions? yes  Where were you discharged to? Home  Items Reviewed:  Medications reviewed: yes  Allergies reviewed: yes  Dietary changes reviewed: yes  Referrals reviewed: yes  Functional Questionnaire:  Activities of Daily Living (ADLs):    States they are independent in the following: All ADLs  States they require assistance with the following: balance is an issue sometimes.   Any transportation issues/concerns?: Daughter is able to transport.   Any patient concerns? Not at this time  Confirmed importance and date/time of follow-up visits scheduled: yes  Provider Appointment booked with Dr. Quay Burow  Confirmed with patient if condition begins to worsen call PCP or go to the ER.  Patient was given the office number and encouraged to call back with question or concerns: Yes

## 2015-07-23 ENCOUNTER — Encounter (HOSPITAL_COMMUNITY)
Admission: RE | Admit: 2015-07-23 | Discharge: 2015-07-23 | Disposition: A | Discharge status: Home / Self Care | Payer: Medicare Other | Source: Ambulatory Visit | Attending: Nephrology | Admitting: Nephrology

## 2015-07-23 DIAGNOSIS — D631 Anemia in chronic kidney disease: Secondary | ICD-10-CM | POA: Insufficient documentation

## 2015-07-23 DIAGNOSIS — N183 Chronic kidney disease, stage 3 (moderate): Secondary | ICD-10-CM | POA: Diagnosis not present

## 2015-07-23 LAB — POCT HEMOGLOBIN-HEMACUE: HEMOGLOBIN: 11 g/dL — AB (ref 12.0–15.0)

## 2015-07-23 MED ORDER — EPOETIN ALFA 10000 UNIT/ML IJ SOLN
INTRAMUSCULAR | Status: AC
  Filled 2015-07-23: qty 1

## 2015-07-23 MED ORDER — EPOETIN ALFA 10000 UNIT/ML IJ SOLN
10000.0000 [IU] | INTRAMUSCULAR | Status: DC
  Administered 2015-07-23: 10000 [IU] via SUBCUTANEOUS

## 2015-07-24 ENCOUNTER — Ambulatory Visit: Payer: Medicare Other | Admitting: Internal Medicine

## 2015-07-29 ENCOUNTER — Encounter: Payer: Self-pay | Admitting: Internal Medicine

## 2015-07-29 ENCOUNTER — Ambulatory Visit (INDEPENDENT_AMBULATORY_CARE_PROVIDER_SITE_OTHER): Payer: Medicare Other | Admitting: Internal Medicine

## 2015-07-29 VITALS — BP 134/62 | HR 69 | Temp 98.5°F | Resp 18 | Wt 155.0 lb

## 2015-07-29 DIAGNOSIS — I10 Essential (primary) hypertension: Secondary | ICD-10-CM | POA: Diagnosis not present

## 2015-07-29 DIAGNOSIS — Z794 Long term (current) use of insulin: Secondary | ICD-10-CM

## 2015-07-29 DIAGNOSIS — N184 Chronic kidney disease, stage 4 (severe): Secondary | ICD-10-CM

## 2015-07-29 DIAGNOSIS — F0390 Unspecified dementia without behavioral disturbance: Secondary | ICD-10-CM

## 2015-07-29 DIAGNOSIS — E0800 Diabetes mellitus due to underlying condition with hyperosmolarity without nonketotic hyperglycemic-hyperosmolar coma (NKHHC): Secondary | ICD-10-CM

## 2015-07-29 NOTE — Assessment & Plan Note (Signed)
Has an appointment with nephrology

## 2015-07-29 NOTE — Assessment & Plan Note (Signed)
Significant enough that she should not be administering her own medication-briefly discussed this with her daughter Continue Aricept

## 2015-07-29 NOTE — Progress Notes (Signed)
Subjective:    Patient ID: [name], female    DOB: Nov 30, 1928, 82 y.o.   MRN: [id number]  HPI  She is here is for follow-up from the hospital. She was admitted 12/24 and discharged on 12/25 .  She is here with her daughter who she has now moved in with.  She does plan to establish care with a new primary care physician at [facility].     She went to the ED because she did not urinate for a day and did not feel well.  She has dementia and was not able to give any further information in the ED.  Her daughter does fill her insulin syringes, but she administers her own medication.  She also eats on her own.  Her diabetes has not been controlled and in the ED her sugar was in the 700's.  She was not able to tell the ED doctors if she missed her insulin doses recently or not.  She did not have DKA.  She had hyperkalemia from CKD and dehydration.  She received calcium gluconate and insulin and her potassium improved.    Today her sugars was 220, but since being home it has been lower, on average 130-150.  She has moved in with her daughter, but her daughter works and she is sometimes alone.  She eats pretty good, except for when her daughter goes to work.   Her daughter helps with her medications, but does not always monitor her taking it.    They have no questions or concerns.  [name] had no complaints and feels well.    Medications and allergies reviewed with patient and updated if appropriate.  Patient Active Problem List   Diagnosis Date Noted  . Uncontrolled type 2 diabetes mellitus with stage 4 chronic kidney disease, with long-term current use of insulin ([place])   . Hyperkalemia 07/18/2015  . Lethargy 06/04/2015  . Diabetic nephropathies ([place]) 05/27/2015  . Acute renal failure superimposed on stage 3 chronic kidney disease ([place]) 05/27/2015  . AKI (acute kidney injury) ([place])   . Hyperglycemia 05/26/2015  . Uncontrolled diabetes mellitus with hyperosmolarity, with long-term current use of insulin  ([place]) 05/26/2015  . Non-compliant behavior 04/01/2015  . Syncope 11/18/2014  . Skin change   . Breast cancer ([place])   . Edema   . Hyperosmolar (nonketotic) coma ([place]) 04/01/2012  . Fracture of left toe 04/01/2012  . CKD (chronic kidney disease) stage 4, GFR 15-29 ml/min (HCC) 04/01/2012  . Unstable angina ([place]) 11/21/2011  . Low back pain   . SYNCOPE 09/07/2009  . Depression 06/24/2009  . Dementia 06/24/2009  . INSOMNIA, CHRONIC 03/25/2009  . Hyperlipidemia 03/10/2009  . ANEMIA 03/10/2009  . Coronary atherosclerosis 03/10/2009  . PVD 03/10/2009  . Uncontrolled type 2 diabetes with renal manifestation ([place]) 03/09/2009  . Essential hypertension 03/09/2009    Current Outpatient Prescriptions on File Prior to Visit  Medication Sig Dispense Refill  . acetaminophen (TYLENOL) 500 MG tablet Take 1,000 mg by mouth every 6 (six) hours as needed for mild pain.    Marland Kitchen amLODipine (NORVASC) 5 MG tablet Take 1 tablet (5 mg total) by mouth daily. 90 tablet 4  . aspirin 81 MG tablet Take 1 tablet (81 mg total) by mouth daily. 30 tablet 3  . atorvastatin (LIPITOR) 20 MG tablet TAKE 1 TABLET BY MOUTH ONCE DAILY 90 tablet 3  . Calcium-Vitamin D 600-200 MG-UNIT tablet Take 1 tablet by mouth 2 (two) times daily.    Marland Kitchen  clopidogrel (PLAVIX) 75 MG tablet Take 1 tablet (75 mg total) by mouth daily. 90 tablet 4  . donepezil (ARICEPT) 10 MG tablet Take 1 tablet (10 mg total) by mouth at bedtime. Must establish with NEW PCP for additional refills. 90 tablet 0  . epoetin alfa (EPOGEN,PROCRIT) 60454 UNIT/ML injection Inject 10,000 Units into the skin every 28 (twenty-eight) days.     . ferrous sulfate 325 (65 FE) MG tablet Take 325 mg by mouth daily with breakfast.    . furosemide (LASIX) 40 MG tablet TAKE ONE TABLET BY MOUTH TWICE DAILY 180 tablet 0  . Insulin Detemir (LEVEMIR FLEXTOUCH) 100 UNIT/ML Pen Inject 22 Units into the skin 2 (two) times daily between meals.    . Insulin Pen Needle (EASY TOUCH PEN  NEEDLES) 31G X 8 MM MISC Inject 1 pen into the skin daily. 100 each 3  . insulin regular (HUMULIN R) 100 units/mL injection Inject 0.05 mLs (5 Units total) into the skin 3 (three) times daily before meals.    . Insulin Syringe-Needle U-100 (INSULIN SYRINGE 1CC/28G) 28G X 1/2" 1 ML MISC Use to check blood sugar three times daily ICD 10 E11 29 100 each 3  . isosorbide mononitrate (IMDUR) 120 MG 24 hr tablet Take 120 mg by mouth every morning.  3  . LYRICA 75 MG capsule TAKE 1 CAPSULE BY MOUTH TWICE DAILY 180 capsule 0  . pantoprazole (PROTONIX) 40 MG tablet Take 1 tablet (40 mg total) by mouth daily at 6 (six) AM. 30 tablet 3  . PARoxetine (PAXIL) 10 MG tablet Take 1 tablet (10 mg total) by mouth at bedtime. Must establish with NEW PCP for additional refills. 90 tablet 0  . traMADol (ULTRAM) 50 MG tablet Take 1 tablet (50 mg total) by mouth every 8 (eight) hours as needed. (Patient taking differently: Take 50 mg by mouth every 8 (eight) hours as needed. For pain) 30 tablet 0  . triamcinolone cream (KENALOG) 0.1 % APPLY TOPICALLY TWICE DAILY 45 g 0   No current facility-administered medications on file prior to visit.    Past Medical History  Diagnosis Date  . 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 ANEMIA   . DEPRESSION   . HYPERTENSION   . PVD   . INSOMNIA, CHRONIC   . Dementia   . DIABETES MELLITUS, TYPE II     renal, neuro - peripheral neuropathy  . Hypercholesteremia   . Chronic kidney disease (CKD), stage IV (severe) ([place])   . Anemia of chronic disease   . Angina   . Non-Q wave myocardial infarction ([place]) 10/2009    /E-chart  . Inferior MI ([place]) 2000    "quiet"  . Breast cancer ([place]) 2000    right  . Loose stools   . Neurodermatitis     Past Surgical History  Procedure Laterality Date  . Capsulectomy  12/27/2010    L eye  . Breast lumpectomy  2000    right  . Cataract extraction w/ intraocular lens  implant, bilateral    . Coronary artery bypass graft  2000    CABG  X1  . Coronary angioplasty with stent placement      "twice"  . Eye surgery      Cataracts (R) eye 06/1999 & (L) eye 09/1999- [facility]  . Combined hysteroscopy diagnostic / d&c  07/2004    /e-chart  . Dilation and curettage of uterus    . Cardiac surgery    .  Left heart catheterization with coronary angiogram N/A 11/22/2011    Procedure: LEFT HEART CATHETERIZATION WITH CORONARY ANGIOGRAM;  Surgeon: Clent Demark, MD;  Location: [facility] CATH LAB;  Service: Cardiovascular;  Laterality: N/A;  . Left heart catheterization with coronary angiogram N/A 10/03/2012    Procedure: LEFT HEART CATHETERIZATION WITH CORONARY ANGIOGRAM;  Surgeon: Clent Demark, MD;  Location: [facility] CATH LAB;  Service: Cardiovascular;  Laterality: N/A;    Social History   Social History  . Marital Status: Widowed    Spouse Name: N/A  . Number of Children: N/A  . Years of Education: N/A   Social History Main Topics  . Smoking status: Former Smoker -- 1.00 packs/day for 20 years    Types: Cigarettes  . Smokeless tobacco: Never Used     Comment: 11/21/11 "quit smoking 30-40 years ago  . Alcohol Use: No     Comment: 11/21/11 "used to drink quit alot; last drink of alcohol maybe 30 years ago"  . Drug Use: No  . Sexual Activity: Not Currently   Other Topics Concern  . None   Social History Narrative   Lives alone with her dog. Very active in her church chair. Volunteers at [employer]    Family History  Problem Relation Age of Onset  . Coronary artery disease Father     Review of Systems  Constitutional: Negative for fever and appetite change.  HENT: Negative for congestion and sore throat.   Respiratory: Negative for cough, shortness of breath and wheezing.   Cardiovascular: Negative for chest pain, palpitations and leg swelling.  Gastrointestinal: Positive for diarrhea (occaisional). Negative for abdominal pain and constipation.  Genitourinary: Negative for dysuria.  Psychiatric/Behavioral: Negative  for sleep disturbance.       Objective:   Filed Vitals:   07/29/15 1558  BP: 134/62  Pulse: 69  Temp: 98.5 F (36.9 C)  Resp: 18   Filed Weights   07/29/15 1558  Weight: 155 lb (70.308 kg)   Body mass index is 27.46 kg/(m^2).   Physical Exam Constitutional: Appears well-developed and well-nourished. No distress.  Neck: Neck supple. No tracheal deviation present. No thyromegaly present.  No carotid bruit. No cervical adenopathy.   Cardiovascular: Normal rate, regular rhythm and normal heart sounds.   No murmur heard.  No edema Pulmonary/Chest: Effort normal and breath sounds normal. No respiratory distress. No wheezes.  Abdomen: soft, nontender       Assessment & Plan:    See Problem List.  No follow up needed - she will be establishing with a new PCP at [facility]

## 2015-07-29 NOTE — Assessment & Plan Note (Signed)
Sugars seem to be controlled at home, but she has uncontrolled DM and that is not new Discussed with her daughter that she no current medications-she is administering her medications Following with endocrine Continue current insulin dose given her recent sugars in controlled

## 2015-07-29 NOTE — Assessment & Plan Note (Signed)
Controlled today Continue current medication

## 2015-07-29 NOTE — Progress Notes (Signed)
Pre visit review using our clinic review tool, if applicable. No additional management support is needed unless otherwise documented below in the visit note. 

## 2015-07-29 NOTE — Patient Instructions (Signed)
Medications reviewed and updated. No changes recommended at this time.  Continuing following with the diabetic specialist and kidney specialist.   Since you are transferring [facility] to [facility] we do not need to schedule a follow up visit.  If you have problems or concerns before you have completely established with your new primary care physician please call us.

## 2015-08-06 ENCOUNTER — Other Ambulatory Visit: Payer: Self-pay | Admitting: Internal Medicine

## 2015-08-07 NOTE — Telephone Encounter (Signed)
Sent to pharmacy 

## 2015-08-07 NOTE — Telephone Encounter (Signed)
I have never seen patient so will fill for one month but she needs to pick new provider and get visit scheduled for refills.

## 2015-08-10 ENCOUNTER — Emergency Department (HOSPITAL_COMMUNITY)
Admission: EM | Admit: 2015-08-10 | Discharge: 2015-08-10 | Disposition: A | Discharge status: Home / Self Care | Payer: Medicare Other | Attending: Emergency Medicine | Admitting: Emergency Medicine

## 2015-08-10 ENCOUNTER — Encounter (HOSPITAL_COMMUNITY): Payer: Self-pay | Admitting: Emergency Medicine

## 2015-08-10 DIAGNOSIS — Z862 Personal history of diseases of the blood and blood-forming organs and certain disorders involving the immune mechanism: Secondary | ICD-10-CM | POA: Insufficient documentation

## 2015-08-10 DIAGNOSIS — Z7902 Long term (current) use of antithrombotics/antiplatelets: Secondary | ICD-10-CM | POA: Insufficient documentation

## 2015-08-10 DIAGNOSIS — N189 Chronic kidney disease, unspecified: Secondary | ICD-10-CM | POA: Diagnosis not present

## 2015-08-10 DIAGNOSIS — Z79899 Other long term (current) drug therapy: Secondary | ICD-10-CM | POA: Insufficient documentation

## 2015-08-10 DIAGNOSIS — E7800 Pure hypercholesterolemia, unspecified: Secondary | ICD-10-CM | POA: Diagnosis not present

## 2015-08-10 DIAGNOSIS — Z87891 Personal history of nicotine dependence: Secondary | ICD-10-CM | POA: Diagnosis not present

## 2015-08-10 DIAGNOSIS — Z7982 Long term (current) use of aspirin: Secondary | ICD-10-CM | POA: Diagnosis not present

## 2015-08-10 DIAGNOSIS — Z872 Personal history of diseases of the skin and subcutaneous tissue: Secondary | ICD-10-CM | POA: Diagnosis not present

## 2015-08-10 DIAGNOSIS — R339 Retention of urine, unspecified: Secondary | ICD-10-CM | POA: Diagnosis not present

## 2015-08-10 DIAGNOSIS — Z853 Personal history of malignant neoplasm of breast: Secondary | ICD-10-CM | POA: Diagnosis not present

## 2015-08-10 DIAGNOSIS — E1140 Type 2 diabetes mellitus with diabetic neuropathy, unspecified: Secondary | ICD-10-CM | POA: Insufficient documentation

## 2015-08-10 DIAGNOSIS — F0390 Unspecified dementia without behavioral disturbance: Secondary | ICD-10-CM | POA: Insufficient documentation

## 2015-08-10 DIAGNOSIS — I129 Hypertensive chronic kidney disease with stage 1 through stage 4 chronic kidney disease, or unspecified chronic kidney disease: Secondary | ICD-10-CM | POA: Diagnosis not present

## 2015-08-10 DIAGNOSIS — F329 Major depressive disorder, single episode, unspecified: Secondary | ICD-10-CM | POA: Diagnosis not present

## 2015-08-10 DIAGNOSIS — Z8669 Personal history of other diseases of the nervous system and sense organs: Secondary | ICD-10-CM | POA: Insufficient documentation

## 2015-08-10 DIAGNOSIS — I25119 Atherosclerotic heart disease of native coronary artery with unspecified angina pectoris: Secondary | ICD-10-CM | POA: Diagnosis not present

## 2015-08-10 DIAGNOSIS — Z9889 Other specified postprocedural states: Secondary | ICD-10-CM | POA: Diagnosis not present

## 2015-08-10 DIAGNOSIS — Z794 Long term (current) use of insulin: Secondary | ICD-10-CM | POA: Insufficient documentation

## 2015-08-10 DIAGNOSIS — Z9861 Coronary angioplasty status: Secondary | ICD-10-CM | POA: Diagnosis not present

## 2015-08-10 DIAGNOSIS — Z7952 Long term (current) use of systemic steroids: Secondary | ICD-10-CM | POA: Diagnosis not present

## 2015-08-10 DIAGNOSIS — Z951 Presence of aortocoronary bypass graft: Secondary | ICD-10-CM | POA: Diagnosis not present

## 2015-08-10 DIAGNOSIS — I252 Old myocardial infarction: Secondary | ICD-10-CM | POA: Diagnosis not present

## 2015-08-10 DIAGNOSIS — N184 Chronic kidney disease, stage 4 (severe): Secondary | ICD-10-CM | POA: Diagnosis not present

## 2015-08-10 LAB — URINE MICROSCOPIC-ADD ON: Squamous Epithelial / LPF: NONE SEEN

## 2015-08-10 LAB — URINALYSIS, ROUTINE W REFLEX MICROSCOPIC
Bilirubin Urine: NEGATIVE
GLUCOSE, UA: NEGATIVE mg/dL
KETONES UR: NEGATIVE mg/dL
Nitrite: NEGATIVE
PROTEIN: NEGATIVE mg/dL
Specific Gravity, Urine: 1.007 (ref 1.005–1.030)
pH: 6 (ref 5.0–8.0)

## 2015-08-10 LAB — CBC WITH DIFFERENTIAL/PLATELET
BASOS ABS: 0 10*3/uL (ref 0.0–0.1)
BASOS PCT: 0 %
EOS ABS: 0.1 10*3/uL (ref 0.0–0.7)
EOS PCT: 2 %
HEMATOCRIT: 35.8 % — AB (ref 36.0–46.0)
Hemoglobin: 11.7 g/dL — ABNORMAL LOW (ref 12.0–15.0)
LYMPHS PCT: 30 %
Lymphs Abs: 1.9 10*3/uL (ref 0.7–4.0)
MCH: 30.7 pg (ref 26.0–34.0)
MCHC: 32.7 g/dL (ref 30.0–36.0)
MCV: 94 fL (ref 78.0–100.0)
MONO ABS: 0.3 10*3/uL (ref 0.1–1.0)
Monocytes Relative: 5 %
Neutro Abs: 4.1 10*3/uL (ref 1.7–7.7)
Neutrophils Relative %: 63 %
Platelets: 270 10*3/uL (ref 150–400)
RBC: 3.81 MIL/uL — ABNORMAL LOW (ref 3.87–5.11)
RDW: 13.7 % (ref 11.5–15.5)
WBC: 6.5 10*3/uL (ref 4.0–10.5)

## 2015-08-10 LAB — BASIC METABOLIC PANEL
Anion gap: 13 (ref 5–15)
BUN: 48 mg/dL — AB (ref 6–20)
CALCIUM: 8.6 mg/dL — AB (ref 8.9–10.3)
CO2: 23 mmol/L (ref 22–32)
CREATININE: 2.97 mg/dL — AB (ref 0.44–1.00)
Chloride: 93 mmol/L — ABNORMAL LOW (ref 101–111)
GFR calc Af Amer: 16 mL/min — ABNORMAL LOW (ref >60)
GFR, EST NON AFRICAN AMERICAN: 13 mL/min — AB (ref >60)
GLUCOSE: 171 mg/dL — AB (ref 65–99)
Potassium: 5 mmol/L (ref 3.5–5.1)
SODIUM: 129 mmol/L — AB (ref 135–145)

## 2015-08-10 NOTE — ED Provider Notes (Signed)
CSN: [id number]     Arrival date & time 08/10/15  [id number] History   First MD Initiated Contact with Patient 08/10/15 1112     Chief Complaint  Patient presents with  . Urinary Retention     (Consider location/radiation/quality/duration/timing/severity/associated sxs/prior Treatment) HPI   [name] is a 82 y.o. female presents for evaluation of decreased urinary For 3 days, with normal oral intake. No constipation. No change in chronic mild lower back pain. She is able to walk. There's been no fecal incontinence. No saddle anesthesia. There are no other known modifying factors.     Past Medical History  Diagnosis Date  . 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 ANEMIA   . DEPRESSION   . HYPERTENSION   . PVD   . INSOMNIA, CHRONIC   . Dementia   . DIABETES MELLITUS, TYPE II     renal, neuro - peripheral neuropathy  . Hypercholesteremia   . Chronic kidney disease (CKD), stage IV (severe) ([place])   . Anemia of chronic disease   . Angina   . Non-Q wave myocardial infarction ([place]) 10/2009    /E-chart  . Inferior MI ([place]) 2000    "quiet"  . Breast cancer ([place]) 2000    right  . Loose stools   . Neurodermatitis    Past Surgical History  Procedure Laterality Date  . Capsulectomy  12/27/2010    L eye  . Breast lumpectomy  2000    right  . Cataract extraction w/ intraocular lens  implant, bilateral    . Coronary artery bypass graft  2000    CABG X1  . Coronary angioplasty with stent placement      "twice"  . Eye surgery      Cataracts (R) eye 06/1999 & (L) eye 09/1999- [place]  . Combined hysteroscopy diagnostic / d&c  07/2004    /e-chart  . Dilation and curettage of uterus    . Cardiac surgery    . Left heart catheterization with coronary angiogram N/A 11/22/2011    Procedure: LEFT HEART CATHETERIZATION WITH CORONARY ANGIOGRAM;  Surgeon: Clent Demark, MD;  Location: [facility] CATH LAB;  Service: Cardiovascular;  Laterality: N/A;  . Left heart catheterization with coronary  angiogram N/A 10/03/2012    Procedure: LEFT HEART CATHETERIZATION WITH CORONARY ANGIOGRAM;  Surgeon: Clent Demark, MD;  Location: [facility] CATH LAB;  Service: Cardiovascular;  Laterality: N/A;   Family History  Problem Relation Age of Onset  . Coronary artery disease Father    Social History  Substance Use Topics  . Smoking status: Former Smoker -- 1.00 packs/day for 20 years    Types: Cigarettes  . Smokeless tobacco: Never Used     Comment: 11/21/11 "quit smoking 30-40 years ago  . Alcohol Use: No     Comment: 11/21/11 "used to drink quit alot; last drink of alcohol maybe 30 years ago"   OB History    No data available     Review of Systems  All other systems reviewed and are negative.     Allergies  Ace inhibitors  Home Medications   Prior to Admission medications   Medication Sig Start Date End Date Taking? Authorizing Provider  acetaminophen (TYLENOL) 500 MG tablet Take 1,000 mg by mouth every 6 (six) hours as needed for mild pain.   Yes Historical Provider, MD  amLODipine (NORVASC) 5 MG tablet Take 1 tablet (5 mg total) by mouth daily. Patient taking differently: Take 5 mg by  mouth daily with breakfast.  12/29/14  Yes Rowe Clack, MD  aspirin 81 MG tablet Take 1 tablet (81 mg total) by mouth daily. 11/23/11  Yes Charolette Forward, MD  atorvastatin (LIPITOR) 20 MG tablet TAKE 1 TABLET BY MOUTH ONCE DAILY 05/05/15  Yes Hoyt Koch, MD  clopidogrel (PLAVIX) 75 MG tablet Take 1 tablet (75 mg total) by mouth daily. 12/29/14  Yes Rowe Clack, MD  donepezil (ARICEPT) 10 MG tablet Take 1 tablet (10 mg total) by mouth at bedtime. Must establish with NEW PCP for additional refills. 07/10/15  Yes Rowe Clack, MD  EASY TOUCH INSULIN SYRINGE 29G X 1/2" 1 ML MISC USE TO INJECT INSULIN THREE TIMES A DAY 08/07/15  Yes Binnie Rail, MD  EASY TOUCH PEN NEEDLES 31G X 8 MM MISC USE AS DIRECTED TO INJECT INSULIN EVERY DAY 08/07/15  Yes Binnie Rail, MD  epoetin alfa  (EPOGEN,PROCRIT) 91478 UNIT/ML injection Inject 10,000 Units into the skin every 28 (twenty-eight) days.    Yes Historical Provider, MD  furosemide (LASIX) 40 MG tablet TAKE ONE TABLET BY MOUTH TWICE DAILY 05/05/15  Yes Hoyt Koch, MD  Insulin Detemir (LEVEMIR FLEXTOUCH) 100 UNIT/ML Pen Inject 22 Units into the skin 2 (two) times daily between meals. 07/19/15  Yes Domenic Polite, MD  insulin regular (HUMULIN R) 100 units/mL injection Inject 0.05 mLs (5 Units total) into the skin 3 (three) times daily before meals. Patient taking differently: Inject 10 Units into the skin 3 (three) times daily before meals.  07/19/15  Yes Domenic Polite, MD  isosorbide mononitrate (IMDUR) 120 MG 24 hr tablet Take 120 mg by mouth daily. Reported on 08/10/2015 03/27/15  Yes Historical Provider, MD  losartan (COZAAR) 50 MG tablet Take 50 mg by mouth daily with breakfast.   Yes Historical Provider, MD  LYRICA 75 MG capsule TAKE 1 CAPSULE BY MOUTH TWICE DAILY 08/07/15  Yes Hoyt Koch, MD  pantoprazole (PROTONIX) 40 MG tablet Take 1 tablet (40 mg total) by mouth daily at 6 (six) AM. 10/04/12  Yes Charolette Forward, MD  PARoxetine (PAXIL) 10 MG tablet Take 1 tablet (10 mg total) by mouth at bedtime. Must establish with NEW PCP for additional refills. 07/10/15  Yes Rowe Clack, MD  traMADol (ULTRAM) 50 MG tablet Take 1 tablet (50 mg total) by mouth every 8 (eight) hours as needed. Patient taking differently: Take 50 mg by mouth every 8 (eight) hours as needed. For pain 12/05/13  Yes Hendricks Limes, MD  triamcinolone cream (KENALOG) 0.1 % APPLY TOPICALLY TWICE DAILY Patient taking differently: APPLY TOPICALLY TWICE DAILY as needed for spots on leg 06/15/15  Yes Rowe Clack, MD   BP 118/59 mmHg  Pulse 61  Resp 18  SpO2 100% Physical Exam  Constitutional: She is oriented to person, place, and time. She appears well-developed.  Elderly, frail  HENT:  Head: Normocephalic and atraumatic.  Right  Ear: External ear normal.  Left Ear: External ear normal.  Eyes: Conjunctivae and EOM are normal. Pupils are equal, round, and reactive to light.  Neck: Normal range of motion and phonation normal. Neck supple.  Cardiovascular: Normal rate, regular rhythm and normal heart sounds.   Pulmonary/Chest: Effort normal and breath sounds normal. She exhibits no bony tenderness.  Abdominal: Soft. There is tenderness (Suprapubic, mild, without palpable mass.).  Musculoskeletal: Normal range of motion.  Neurological: She is alert and oriented to person, place, and time. No cranial nerve deficit or  sensory deficit. She exhibits normal muscle tone. Coordination normal.  Skin: Skin is warm, dry and intact.  Psychiatric: She has a normal mood and affect. Her behavior is normal.  Nursing note and vitals reviewed.   ED Course  Procedures (including critical care time)  Medications - No data to display  Patient Vitals for the past 24 hrs:  BP Pulse Resp SpO2  08/10/15 1528 118/59 mmHg 61 18 100 %  08/10/15 1455 132/62 mmHg 64 16 100 %  08/10/15 1423 92/65 mmHg 62 16 100 %  08/10/15 1128 164/69 mmHg (!) 57 16 100 %    3:32 PM Reevaluation with update and discussion. After initial assessment and treatment, an updated evaluation reveals patient is comfortable. Discussed pluses and minuses of keeping Foley catheter in, and the patient and her daughter decided to give a voiding trial with the catheter removed. We talked about her prior problems voiding, when she was admitted on 07/18/2015, which improved spontaneously. They have a follow-up appointment with a new primary care doctor, in 2 days' time, at which time they can reassess her voiding issues. Mailen Newborn L   Labs Review Labs Reviewed  URINALYSIS, ROUTINE W REFLEX MICROSCOPIC (NOT AT [facility]) - Abnormal; Notable for the following:    APPearance CLOUDY (*)    Hgb urine dipstick TRACE (*)    Leukocytes, UA LARGE (*)    All other components within  normal limits  BASIC METABOLIC PANEL - Abnormal; Notable for the following:    Sodium 129 (*)    Chloride 93 (*)    Glucose, Bld 171 (*)    BUN 48 (*)    Creatinine, Ser 2.97 (*)    Calcium 8.6 (*)    GFR calc non Af Amer 13 (*)    GFR calc Af Amer 16 (*)    All other components within normal limits  CBC WITH DIFFERENTIAL/PLATELET - Abnormal; Notable for the following:    RBC 3.81 (*)    Hemoglobin 11.7 (*)    HCT 35.8 (*)    All other components within normal limits  URINE MICROSCOPIC-ADD ON - Abnormal; Notable for the following:    Bacteria, UA RARE (*)    All other components within normal limits  URINE CULTURE    Imaging Review No results found. I have personally reviewed and evaluated these images and lab results as part of my medical decision-making.   EKG Interpretation None      MDM   Final diagnoses:  CRI (chronic renal insufficiency), unspecified stage  Urinary retention   Voiding difficulty with mild urinary retention, improved with Foley catheterization. Renal insufficiency appears to be at baseline. This does not represent a picture of urinary outlet obstruction. Doubt serous patella, infection, metabolic instability or impending vascular collapse.    Nursing Notes Reviewed/ Care Coordinated Applicable Imaging Reviewed Interpretation of Laboratory Data incorporated into ED treatment  The patient appears reasonably screened and/or stabilized for discharge and I doubt any other medical condition or other [facility] requiring further screening, evaluation, or treatment in the ED at this time prior to discharge.  Plan: Home Medications- usual; Home Treatments- rest; return here if the recommended treatment, does not improve the symptoms; Recommended follow up- PCP in 2 days as planned   Daleen Bo, MD 08/10/15 1536

## 2015-08-10 NOTE — Discharge Instructions (Signed)
Get plenty of rest, drink a lot of fluids. Follow-up with your new primary care doctor on Wednesday, as planned. Return here as needed for problems.   Chronic Kidney Disease Chronic kidney disease occurs when the kidneys are damaged over a long period. The kidneys are two organs that lie on either side of the spine between the middle of the back and the front of the abdomen. The kidneys:  Remove wastes and extra water from the blood.  Produce important hormones. These help keep bones strong, regulate blood pressure, and help create red blood cells.  Balance the fluids and chemicals in the blood and tissues. A small amount of kidney damage may not cause problems, but a large amount of damage may make it difficult or impossible for the kidneys to work the way they should. If steps are not taken to slow down the kidney damage or stop it from getting worse, the kidneys may stop working permanently. Most of the time, chronic kidney disease does not go away. However, it can often be controlled, and those with the disease can usually live normal lives. CAUSES The most common causes of chronic kidney disease are diabetes and high blood pressure (hypertension). Chronic kidney disease may also be caused by:  Diseases that cause the kidneys' filters to become inflamed.  Diseases that affect the immune system.  Genetic diseases.  Medicines that damage the kidneys, such as anti-inflammatory medicines.  Poisoning or exposure to toxic substances.  A reoccurring kidney or urinary infection.  A problem with urine flow. This may be caused by:  Cancer.  Kidney stones.  An enlarged prostate in males. SIGNS AND SYMPTOMS Because the kidney damage in chronic kidney disease occurs slowly, symptoms develop slowly and may not be obvious until the kidney damage becomes severe. A person may have a kidney disease for years without showing any symptoms. Symptoms can include:  Swelling (edema) of the legs,  ankles, or feet.  Tiredness (lethargy).  Nausea or vomiting.  Confusion.  Problems with urination, such as:  Decreased urine production.  Frequent urination, especially at night.  Frequent accidents in children who are potty trained.  Muscle twitches and cramps.  Shortness of breath.  Weakness.  Persistent itchiness.  Loss of appetite.  Metallic taste in the mouth.  Trouble sleeping.  Slowed development in children.  Short stature in children. DIAGNOSIS Chronic kidney disease may be detected and diagnosed by tests, including blood, urine, imaging, or kidney biopsy tests. TREATMENT Most chronic kidney diseases cannot be cured. Treatment usually involves relieving symptoms and preventing or slowing the progression of the disease. Treatment may include:  A special diet. You may need to avoid alcohol and foods thatare salty and high in potassium.  Medicines. These may:  Lower blood pressure.  Relieve anemia.  Relieve swelling.  Protect the bones. HOME CARE INSTRUCTIONS  Follow your prescribed diet. Your health care provider may instruct you to limit daily salt (sodium) and protein intake.  Take medicines only as directed by your health care provider. Do not take any new medicines (prescription, over-the-counter, or nutritional supplements) unless approved by your health care provider. Many medicines can worsen your kidney damage or need to have the dose adjusted.   Quit smoking if you smoke. Talk to your health care provider about a smoking cessation program.  Keep all follow-up visits as directed by your health care provider.  Monitor your blood pressure.  Start or continue an exercise plan.  Get immunizations as directed by your health  care provider.  Take vitamin and mineral supplements as directed by your health care provider. SEEK IMMEDIATE MEDICAL CARE IF:  Your symptoms get worse or you develop new symptoms.  You develop symptoms of  end-stage kidney disease. These include:  Headaches.  Abnormally dark or light skin.  Numbness in the hands or feet.  Easy bruising.  Frequent hiccups.  Menstruation stops.  You have a fever.  You have decreased urine production.  You havepain or bleeding when urinating. MAKE SURE YOU:  Understand these instructions.  Will watch your condition.  Will get help right away if you are not doing well or get worse. FOR MORE INFORMATION   American Association of Kidney Patients: BombTimer.gl  National Kidney Foundation: www.kidney.[place]: https://mathis.com/  [employer] [facility]: www.lifeoptions.org and www.kidneyschool.org   This information is not intended to replace advice given to you by your health care provider. Make sure you discuss any questions you have with your health care provider.   Document Released: 04/19/2008 Document Revised: 08/01/2014 Document Reviewed: 03/09/2012 Elsevier Interactive Patient Education [employer].

## 2015-08-10 NOTE — ED Notes (Signed)
Bladder scan 350 ml 

## 2015-08-10 NOTE — ED Notes (Signed)
Pt reports inability to void since Saturday; reports appropriate intake; denies pain.

## 2015-08-12 LAB — URINE CULTURE

## 2015-08-13 ENCOUNTER — Telehealth (HOSPITAL_COMMUNITY): Payer: Self-pay

## 2015-08-13 NOTE — Progress Notes (Signed)
ED Antimicrobial Stewardship Positive Culture Follow Up   [name] is an 82 y.o. female who presented to [facility] on 08/10/2015 with a chief complaint of  Chief Complaint  Patient presents with  . Urinary Retention    Recent Results (from the past 720 hour(s))  Urine culture     Status: None   Collection Time: 08/10/15 11:36 AM  Result Value Ref Range Status   Specimen Description URINE, CATHETERIZED  Final   Special Requests NONE  Final   Culture   Final    >=100,000 COLONIES/mL ENTEROCOCCUS SPECIES Performed at [facility]    Report Status 08/12/2015 FINAL  Final   Organism ID, Bacteria ENTEROCOCCUS SPECIES  Final      Susceptibility   Enterococcus species - MIC*    AMPICILLIN <=2 SENSITIVE Sensitive     LEVOFLOXACIN 1 SENSITIVE Sensitive     NITROFURANTOIN <=16 SENSITIVE Sensitive     VANCOMYCIN 1 SENSITIVE Sensitive     * >=100,000 COLONIES/mL ENTEROCOCCUS SPECIES    [x]  Patient discharged originally without antimicrobial agent and treatment is now indicated. Call patient for symptom check to verify. If patient is following up with PCP, then can hold off on treatment.  New antibiotic prescription if indicated: amoxicillin 500 mg twice daily for 7 days  ED Provider: Margarita Mail, PA-C  Melburn Popper, PharmD Clinical Pharmacy Resident Pager: [phone] 08/13/2015 8:58 AM

## 2015-08-13 NOTE — Telephone Encounter (Signed)
Post ED Visit - Positive Culture Follow-up: Successful Patient Follow-Up  Culture assessed and recommendations reviewed by: []  Elenor Quinones, Pharm.D. []  Heide Guile, Pharm.D., BCPS []  Parks Neptune, Pharm.D. []  Alycia Rossetti, Pharm.D., BCPS []  [place], [place].D., BCPS, AAHIVP []  Legrand Como, Pharm.D., BCPS, AAHIVP []  Milus Glazier, Pharm.D. []  Stephens November, Pharm.DJohnney Killian, Pharm.D.  Positive urine culture, >/= 100,000 colonies -> enterococcus species  [x]  Patient discharged without antimicrobial prescription and treatment is now indicated []  Organism is resistant to prescribed ED discharge antimicrobial []  Patient with positive blood cultures  Changes discussed with ED provider: A. Harris PA "Sx check/Fu" New antibiotic prescription "Amoxicillin 500 mg every 12 hrs x 7 days" Called to [facility] [phone] and given to RPh.  Contacted patient, date 08/13/15, time 09:53 Pt informed.  Pt still having UTI symptoms.  Rx called to [facility].   Dortha Kern 08/13/2015, 9:59 AM

## 2015-08-20 ENCOUNTER — Encounter (HOSPITAL_COMMUNITY)
Admission: RE | Admit: 2015-08-20 | Discharge: 2015-08-20 | Disposition: A | Discharge status: Home / Self Care | Payer: Medicare Other | Source: Ambulatory Visit | Attending: Nephrology | Admitting: Nephrology

## 2015-08-20 DIAGNOSIS — N183 Chronic kidney disease, stage 3 (moderate): Secondary | ICD-10-CM | POA: Diagnosis not present

## 2015-08-20 DIAGNOSIS — D631 Anemia in chronic kidney disease: Secondary | ICD-10-CM | POA: Insufficient documentation

## 2015-08-20 LAB — FERRITIN: FERRITIN: 461 ng/mL — AB (ref 11–307)

## 2015-08-20 LAB — IRON AND TIBC
IRON: 120 ug/dL (ref 28–170)
Saturation Ratios: 47 % — ABNORMAL HIGH (ref 10.4–31.8)
TIBC: 256 ug/dL (ref 250–450)
UIBC: 136 ug/dL

## 2015-08-20 LAB — POCT HEMOGLOBIN-HEMACUE: Hemoglobin: 11.5 g/dL — ABNORMAL LOW (ref 12.0–15.0)

## 2015-08-20 MED ORDER — EPOETIN ALFA 10000 UNIT/ML IJ SOLN
10000.0000 [IU] | INTRAMUSCULAR | Status: DC
  Administered 2015-08-20: 10000 [IU] via SUBCUTANEOUS

## 2015-08-20 MED ORDER — EPOETIN ALFA 10000 UNIT/ML IJ SOLN
INTRAMUSCULAR | Status: AC
  Filled 2015-08-20: qty 1

## 2015-08-25 ENCOUNTER — Ambulatory Visit: Payer: Medicare Other | Admitting: Internal Medicine

## 2015-09-17 ENCOUNTER — Other Ambulatory Visit (HOSPITAL_COMMUNITY): Payer: Self-pay | Admitting: *Deleted

## 2015-09-18 ENCOUNTER — Inpatient Hospital Stay (HOSPITAL_COMMUNITY)
Admission: RE | Admit: 2015-09-18 | Discharge: 2015-09-18 | Disposition: A | Discharge status: Home / Self Care | Payer: Medicare Other | Source: Ambulatory Visit | Attending: Nephrology | Admitting: Nephrology

## 2015-10-06 ENCOUNTER — Emergency Department (HOSPITAL_COMMUNITY)
Admission: EM | Admit: 2015-10-06 | Discharge: 2015-10-07 | Disposition: A | Discharge status: Home / Self Care | Payer: Medicare (Managed Care) | Attending: Emergency Medicine | Admitting: Emergency Medicine

## 2015-10-06 ENCOUNTER — Encounter (HOSPITAL_COMMUNITY): Payer: Self-pay

## 2015-10-06 DIAGNOSIS — Z862 Personal history of diseases of the blood and blood-forming organs and certain disorders involving the immune mechanism: Secondary | ICD-10-CM | POA: Diagnosis not present

## 2015-10-06 DIAGNOSIS — Z9861 Coronary angioplasty status: Secondary | ICD-10-CM | POA: Insufficient documentation

## 2015-10-06 DIAGNOSIS — Z951 Presence of aortocoronary bypass graft: Secondary | ICD-10-CM | POA: Insufficient documentation

## 2015-10-06 DIAGNOSIS — Z794 Long term (current) use of insulin: Secondary | ICD-10-CM | POA: Insufficient documentation

## 2015-10-06 DIAGNOSIS — Z7982 Long term (current) use of aspirin: Secondary | ICD-10-CM | POA: Diagnosis not present

## 2015-10-06 DIAGNOSIS — Z87891 Personal history of nicotine dependence: Secondary | ICD-10-CM | POA: Insufficient documentation

## 2015-10-06 DIAGNOSIS — Z7902 Long term (current) use of antithrombotics/antiplatelets: Secondary | ICD-10-CM | POA: Diagnosis not present

## 2015-10-06 DIAGNOSIS — Z853 Personal history of malignant neoplasm of breast: Secondary | ICD-10-CM | POA: Insufficient documentation

## 2015-10-06 DIAGNOSIS — E875 Hyperkalemia: Secondary | ICD-10-CM | POA: Diagnosis not present

## 2015-10-06 DIAGNOSIS — E119 Type 2 diabetes mellitus without complications: Secondary | ICD-10-CM | POA: Insufficient documentation

## 2015-10-06 DIAGNOSIS — F0390 Unspecified dementia without behavioral disturbance: Secondary | ICD-10-CM | POA: Insufficient documentation

## 2015-10-06 DIAGNOSIS — N184 Chronic kidney disease, stage 4 (severe): Secondary | ICD-10-CM | POA: Diagnosis not present

## 2015-10-06 DIAGNOSIS — I25119 Atherosclerotic heart disease of native coronary artery with unspecified angina pectoris: Secondary | ICD-10-CM | POA: Insufficient documentation

## 2015-10-06 DIAGNOSIS — I252 Old myocardial infarction: Secondary | ICD-10-CM | POA: Diagnosis not present

## 2015-10-06 DIAGNOSIS — I129 Hypertensive chronic kidney disease with stage 1 through stage 4 chronic kidney disease, or unspecified chronic kidney disease: Secondary | ICD-10-CM | POA: Diagnosis not present

## 2015-10-06 DIAGNOSIS — R7989 Other specified abnormal findings of blood chemistry: Secondary | ICD-10-CM | POA: Diagnosis present

## 2015-10-06 DIAGNOSIS — F329 Major depressive disorder, single episode, unspecified: Secondary | ICD-10-CM | POA: Diagnosis not present

## 2015-10-06 DIAGNOSIS — E7800 Pure hypercholesterolemia, unspecified: Secondary | ICD-10-CM | POA: Insufficient documentation

## 2015-10-06 LAB — CBC WITH DIFFERENTIAL/PLATELET
BASOS PCT: 0 %
Basophils Absolute: 0 10*3/uL (ref 0.0–0.1)
EOS ABS: 0.2 10*3/uL (ref 0.0–0.7)
EOS PCT: 4 %
HEMATOCRIT: 33.4 % — AB (ref 36.0–46.0)
HEMOGLOBIN: 10.8 g/dL — AB (ref 12.0–15.0)
LYMPHS PCT: 34 %
Lymphs Abs: 1.7 10*3/uL (ref 0.7–4.0)
MCH: 30.1 pg (ref 26.0–34.0)
MCHC: 32.3 g/dL (ref 30.0–36.0)
MCV: 93 fL (ref 78.0–100.0)
MONOS PCT: 5 %
Monocytes Absolute: 0.2 10*3/uL (ref 0.1–1.0)
NEUTROS ABS: 2.8 10*3/uL (ref 1.7–7.7)
NEUTROS PCT: 57 %
Platelets: ADEQUATE 10*3/uL (ref 150–400)
RBC: 3.59 MIL/uL — ABNORMAL LOW (ref 3.87–5.11)
RDW: 13.1 % (ref 11.5–15.5)
SMEAR REVIEW: ADEQUATE
WBC: 4.9 10*3/uL (ref 4.0–10.5)

## 2015-10-06 LAB — COMPREHENSIVE METABOLIC PANEL
ALK PHOS: 248 U/L — AB (ref 38–126)
ALT: 17 U/L (ref 14–54)
AST: 19 U/L (ref 15–41)
Albumin: 3.4 g/dL — ABNORMAL LOW (ref 3.5–5.0)
Anion gap: 9 (ref 5–15)
BILIRUBIN TOTAL: 0.6 mg/dL (ref 0.3–1.2)
BUN: 28 mg/dL — ABNORMAL HIGH (ref 6–20)
CALCIUM: 8.7 mg/dL — AB (ref 8.9–10.3)
CO2: 23 mmol/L (ref 22–32)
CREATININE: 2.22 mg/dL — AB (ref 0.44–1.00)
Chloride: 110 mmol/L (ref 101–111)
GFR calc non Af Amer: 19 mL/min — ABNORMAL LOW (ref >60)
GFR, EST AFRICAN AMERICAN: 22 mL/min — AB (ref >60)
Glucose, Bld: 179 mg/dL — ABNORMAL HIGH (ref 65–99)
Potassium: 5.7 mmol/L — ABNORMAL HIGH (ref 3.5–5.1)
SODIUM: 142 mmol/L (ref 135–145)
TOTAL PROTEIN: 7.7 g/dL (ref 6.5–8.1)

## 2015-10-06 LAB — I-STAT CHEM 8, ED
BUN: 34 mg/dL — ABNORMAL HIGH (ref 6–20)
CALCIUM ION: 1.11 mmol/L — AB (ref 1.13–1.30)
CHLORIDE: 107 mmol/L (ref 101–111)
CREATININE: 2.3 mg/dL — AB (ref 0.44–1.00)
GLUCOSE: 495 mg/dL — AB (ref 65–99)
HCT: 34 % — ABNORMAL LOW (ref 36.0–46.0)
Hemoglobin: 11.6 g/dL — ABNORMAL LOW (ref 12.0–15.0)
Potassium: 5.3 mmol/L — ABNORMAL HIGH (ref 3.5–5.1)
SODIUM: 140 mmol/L (ref 135–145)
TCO2: 22 mmol/L (ref 0–100)

## 2015-10-06 MED ORDER — PATIROMER SORBITEX CALCIUM 8.4 G PO PACK: 8.4000 g | PACK | Freq: Every day | ORAL | Status: DC

## 2015-10-06 MED ORDER — SODIUM CHLORIDE 0.9 % IV BOLUS (SEPSIS)
500.0000 mL | Freq: Once | INTRAVENOUS | Status: AC
  Administered 2015-10-06: 500 mL via INTRAVENOUS

## 2015-10-06 MED ORDER — FUROSEMIDE 10 MG/ML IJ SOLN
40.0000 mg | Freq: Once | INTRAMUSCULAR | Status: AC
  Administered 2015-10-06: 40 mg via INTRAVENOUS
  Filled 2015-10-06: qty 4

## 2015-10-06 MED ORDER — SODIUM POLYSTYRENE SULFONATE 15 GM/60ML PO SUSP
15.0000 g | Freq: Once | ORAL | Status: AC
  Administered 2015-10-06: 15 g via ORAL
  Filled 2015-10-06: qty 60

## 2015-10-06 NOTE — ED Provider Notes (Signed)
CSN: [id number]     Arrival date & time 10/06/15  1333 History   First MD Initiated Contact with Patient 10/06/15 2005     Chief Complaint  Patient presents with  . high potassium      (Consider location/radiation/quality/duration/timing/severity/associated sxs/prior Treatment) HPI  [name] is an 82 year old female with a history of chronic kidney disease stage IV, diabetes, hypertension, coronary artery disease who presents today because she was told to come to the ED because her potassium was high. He was noted to have some high potassium several weeks ago and her potassium was stopped. She had a recheck today and it was 5.7 here. She denies any change in her by mouth intake, change in urine output, chest pain, or syncope.  Past Medical History  Diagnosis Date  . 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 ANEMIA   . DEPRESSION   . HYPERTENSION   . PVD   . INSOMNIA, CHRONIC   . Dementia   . DIABETES MELLITUS, TYPE II     renal, neuro - peripheral neuropathy  . Hypercholesteremia   . Chronic kidney disease (CKD), stage IV (severe) ([place])   . Anemia of chronic disease   . Angina   . Non-Q wave myocardial infarction ([place]) 10/2009    /E-chart  . Inferior MI ([place]) 2000    "quiet"  . Breast cancer ([place]) 2000    right  . Loose stools   . Neurodermatitis    Past Surgical History  Procedure Laterality Date  . Capsulectomy  12/27/2010    L eye  . Breast lumpectomy  2000    right  . Cataract extraction w/ intraocular lens  implant, bilateral    . Coronary artery bypass graft  2000    CABG X1  . Coronary angioplasty with stent placement      "twice"  . Eye surgery      Cataracts (R) eye 06/1999 & (L) eye 09/1999- [place]  . Combined hysteroscopy diagnostic / d&c  07/2004    /e-chart  . Dilation and curettage of uterus    . Cardiac surgery    . Left heart catheterization with coronary angiogram N/A 11/22/2011    Procedure: LEFT HEART CATHETERIZATION WITH CORONARY ANGIOGRAM;   Surgeon: Clent Demark, MD;  Location[facility] C[facility] LAB;  Service: Cardiovascular;  Laterality: N/A;  . Left heart catheterization with coronary angiogram N/A 10/03/2012    Procedure: LEFT HEART CATHETERIZATION WITH CORONARY ANGIOGRAM;  Surgeon: Clent Demark, MD;  Location: [facility] CATH LAB;  Service: Cardiovascular;  Laterality: N/A;   Family History  Problem Relation Age of Onset  . Coronary artery disease Father    Social History  Substance Use Topics  . Smoking status: Former Smoker -- 1.00 packs/day for 20 years    Types: Cigarettes  . Smokeless tobacco: Never Used     Comment: 11/21/11 "quit smoking 30-40 years ago  . Alcohol Use: No     Comment: 11/21/11 "used to drink quit alot; last drink of alcohol maybe 30 years ago"   OB History    No data available     Review of Systems  All other systems reviewed and are negative.     Allergies  Ace inhibitors  Home Medications   Prior to Admission medications   Medication Sig Start Date End Date Taking? Authorizing Provider  acetaminophen (TYLENOL) 500 MG tablet Take 1,000 mg by mouth every 6 (six) hours as needed for mild pain.  Historical Provider, MD  amLODipine (NORVASC) 5 MG tablet Take 1 tablet (5 mg total) by mouth daily. Patient taking differently: Take 5 mg by mouth daily with breakfast.  12/29/14   Rowe Clack, MD  aspirin 81 MG tablet Take 1 tablet (81 mg total) by mouth daily. 11/23/11   Charolette Forward, MD  atorvastatin (LIPITOR) 20 MG tablet TAKE 1 TABLET BY MOUTH ONCE DAILY 05/05/15   Hoyt Koch, MD  clopidogrel (PLAVIX) 75 MG tablet Take 1 tablet (75 mg total) by mouth daily. 12/29/14   Rowe Clack, MD  donepezil (ARICEPT) 10 MG tablet Take 1 tablet (10 mg total) by mouth at bedtime. Must establish with NEW PCP for additional refills. 07/10/15   Rowe Clack, MD  EASY TOUCH INSULIN SYRINGE 29G X 1/2" 1 ML MISC USE TO INJECT INSULIN THREE TIMES A DAY 08/07/15   Binnie Rail, MD  EASY TOUCH PEN  NEEDLES 31G X 8 MM MISC USE AS DIRECTED TO INJECT INSULIN EVERY DAY 08/07/15   Binnie Rail, MD  epoetin alfa (EPOGEN,PROCRIT) 02725 UNIT/ML injection Inject 10,000 Units into the skin every 28 (twenty-eight) days.     Historical Provider, MD  furosemide (LASIX) 40 MG tablet TAKE ONE TABLET BY MOUTH TWICE DAILY 05/05/15   Hoyt Koch, MD  Insulin Detemir (LEVEMIR FLEXTOUCH) 100 UNIT/ML Pen Inject 22 Units into the skin 2 (two) times daily between meals. 07/19/15   Domenic Polite, MD  insulin regular (HUMULIN R) 100 units/mL injection Inject 0.05 mLs (5 Units total) into the skin 3 (three) times daily before meals. Patient taking differently: Inject 10 Units into the skin 3 (three) times daily before meals.  07/19/15   Domenic Polite, MD  isosorbide mononitrate (IMDUR) 120 MG 24 hr tablet Take 120 mg by mouth daily. Reported on 08/10/2015 03/27/15   Historical Provider, MD  losartan (COZAAR) 50 MG tablet Take 50 mg by mouth daily with breakfast.    Historical Provider, MD  LYRICA 75 MG capsule TAKE 1 CAPSULE BY MOUTH TWICE DAILY 08/07/15   Hoyt Koch, MD  pantoprazole (PROTONIX) 40 MG tablet Take 1 tablet (40 mg total) by mouth daily at 6 (six) AM. 10/04/12   Charolette Forward, MD  PARoxetine (PAXIL) 10 MG tablet Take 1 tablet (10 mg total) by mouth at bedtime. Must establish with NEW PCP for additional refills. 07/10/15   Rowe Clack, MD  traMADol (ULTRAM) 50 MG tablet Take 1 tablet (50 mg total) by mouth every 8 (eight) hours as needed. Patient taking differently: Take 50 mg by mouth every 8 (eight) hours as needed. For pain 12/05/13   Hendricks Limes, MD  triamcinolone cream (KENALOG) 0.1 % APPLY TOPICALLY TWICE DAILY Patient taking differently: APPLY TOPICALLY TWICE DAILY as needed for spots on leg 06/15/15   Rowe Clack, MD   BP 189/58 mmHg  Pulse 67  Temp(Src) 99.1 F (37.3 C) (Oral)  Resp 20  Ht 5\' 3"  (1.6 m)  Wt 73.029 kg  BMI 28.53 kg/m2  SpO2 98% Physical  Exam  Constitutional: She is oriented to person, place, and time. She appears well-developed and well-nourished. No distress.  HENT:  Head: Normocephalic and atraumatic.  Right Ear: External ear normal.  Left Ear: External ear normal.  Nose: Nose normal.  Eyes: Conjunctivae and EOM are normal. Pupils are equal, round, and reactive to light.  Neck: Normal range of motion. Neck supple.  Pulmonary/Chest: Effort normal.  Musculoskeletal: Normal range of motion.  Neurological: She is alert and oriented to person, place, and time. She exhibits normal muscle tone. Coordination normal.  Skin: Skin is warm and dry.  Psychiatric: She has a normal mood and affect. Her behavior is normal. Thought content normal.  Nursing note and vitals reviewed.   ED Course  Procedures (including critical care time) Labs Review Labs Reviewed  CBC WITH DIFFERENTIAL/PLATELET - Abnormal; Notable for the following:    RBC 3.59 (*)    Hemoglobin 10.8 (*)    HCT 33.4 (*)    All other components within normal limits  COMPREHENSIVE METABOLIC PANEL - Abnormal; Notable for the following:    Potassium 5.7 (*)    Glucose, Bld 179 (*)    BUN 28 (*)    Creatinine, Ser 2.22 (*)    Calcium 8.7 (*)    Albumin 3.4 (*)    Alkaline Phosphatase 248 (*)    GFR calc non Af Amer 19 (*)    GFR calc Af Amer 22 (*)    All other components within normal limits    Imaging Review No results found. I have personally reviewed and evaluated these images and lab results as part of my medical decision-making.   EKG Interpretation   Date/Time:  Tuesday October 06 2015 14:25:24 EDT Ventricular Rate:  53 PR Interval:  160 QRS Duration: 82 QT Interval:  452 QTC Calculation: 424 R Axis:   -31 Text Interpretation:  Sinus bradycardia Left axis deviation Inferior  infarct , age undetermined Anterolateral infarct , age undetermined  Abnormal ECG lateral q waves are new from first prior ekg of 18 July 2015 Confirmed by Jeanell Sparrow MD,  Andee Poles [id number]) on 10/06/2015 7:36:58 PM      MDM   Final diagnoses:  Hyperkalemia  This is a 82 year old female who comes in today with mild hypokalemia at 5.7. She does have an elevated creatinine 2.22. EKG does not show any evidence of hypokalemia. She is treated here with IV fluids, Lasix, Kayexalate.  Potassium to 5.3 after fluids and meds. Limit  Plan veltassa   recheck with her doctor tomorrow for repeat potassium.   Pattricia Boss, MD 10/06/15 [phone]

## 2015-10-06 NOTE — ED Notes (Signed)
Patient here after doctor called because potassium high. Reports unable to rest last pm. Denies any discomfort

## 2015-10-06 NOTE — ED Notes (Signed)
Pt left with all his belongings and ambulated out of the treatment area.  

## 2015-10-06 NOTE — Discharge Instructions (Signed)
Hyperkalemia  Hyperkalemia is when you have too much potassium in your blood. Potassium is normally removed (excreted) from your body by your kidneys. If there is too much potassium in your blood, it can affect your heart's ability to function.   CAUSES   Hyperkalemia may be caused by:   · Taking in too much potassium. You can do this by:    Using salt substitutes. They contain large amounts of potassium.    Taking potassium supplements.    Eating foods high in potassium.  · Excreting too [place] potassium. This can happen if:    Your kidneys are not working properly. Kidney (renal) disease, including short- or long-term renal failure, is a very common cause of hyperkalemia.    You are taking medicines that lower your excretion of potassium.    You have Addison disease.    You have a urinary tract blockage, such as kidney stones.    You are on treatment to mechanically clean your blood (dialysis) and you skip a treatment.  · Releasing a high amount of potassium from your cells into your blood. This can happen with:    Injury to muscles (rhabdomyolysis) or other tissues. Most potassium is stored in your muscles.    Severe burns or infections.    Acidic blood plasma (acidosis). Acidosis can result from many diseases, such as uncontrolled diabetes.  RISK FACTORS  The most common risk factor of hyperkalemia is kidney disease. Other risk factors of hyperkalemia include:  · Addison disease. This is a condition where your glands do not produce enough hormones.  · Alcoholism or heavy drug use.    · Using certain blood pressure medicines, such as angiotensin-converting enzyme (ACE) inhibitors, angiotensin II receptor blockers (ARBs), or potassium-sparing diuretics such as spironolactone.  · Severe injury or burn.  SIGNS AND SYMPTOMS   Oftentimes, there are no signs or symptoms of hyperkalemia.  However, when your potassium level becomes high enough, you may experience symptoms such as:  · Irregular or very slow  heartbeat.  · Nausea.  · Fatigue.  · Tingling of the skin or numbness of the hands or feet.  · Muscle weakness.  · Fatigue.  · Not being able to move (paralysis).  You may not have any symptoms of hyperkalemia.   DIAGNOSIS   Hyperkalemia may be diagnosed by:  · Physical exam.  · Blood tests.  · ECG (electrocardiogram).  · Discussion of prescription and non-prescription drug use.  TREATMENT   Treatment for hyperkalemia is often directed at the underlying cause. In some instances, treatment may include:   · Insulin.  · Glucose (sugar) and water solution given through a vein (intravenous or IV ).  · Dialysis.  · Medicines to remove the potassium from your body.  · Medicines to move calcium from your bloodstream into your tissues.  HOME CARE INSTRUCTIONS   · Take medicines only as directed by your health care provider.  · Do not take any supplements, natural products, herbs, or vitamins without reviewing them with your health care provider. Certain supplements and natural food products can have high amounts of potassium.  · Limit your alcohol intake as directed by your health care provider.  · Stop illegal drug use. If you need help quitting, ask your health care provider.  · Keep all follow-up visits as directed by your health care provider. This is important.  · If you have kidney disease, you may need to follow a low potassium diet. A dietitian can help educate you on   low potassium foods.  SEEK MEDICAL CARE IF:   · You notice an irregular or very slow heartbeat.  · You feel light-headed.  · You feel weak.  · You are nauseous.  · You have tingling or numbness in your hands or feet.  SEEK IMMEDIATE MEDICAL CARE IF:   · You have shortness of breath.  · You have chest pain or discomfort.  · You pass out.  · You have muscle paralysis.  MAKE SURE YOU:   · Understand these instructions.  · Will watch your condition.  · Will get help right away if you are not doing well or get worse.     This information is not intended to  replace advice given to you by your health care provider. Make sure you discuss any questions you have with your health care provider.     Document Released: 07/01/2002 Document Revised: 08/01/2014 Document Reviewed: 10/16/2013  Elsevier Interactive Patient Education ©2016 [employer]

## 2015-10-07 LAB — POTASSIUM: Potassium: 5.2 mmol/L — ABNORMAL HIGH (ref 3.5–5.1)

## 2016-02-06 ENCOUNTER — Inpatient Hospital Stay (HOSPITAL_COMMUNITY)
Admission: EM | Admit: 2016-02-06 | Discharge: 2016-02-11 | DRG: 291 | Disposition: A | Discharge status: Skilled Nursing Facility | Payer: Medicare (Managed Care) | Attending: Internal Medicine | Admitting: Internal Medicine

## 2016-02-06 DIAGNOSIS — I2510 Atherosclerotic heart disease of native coronary artery without angina pectoris: Secondary | ICD-10-CM | POA: Diagnosis present

## 2016-02-06 DIAGNOSIS — D631 Anemia in chronic kidney disease: Secondary | ICD-10-CM | POA: Diagnosis present

## 2016-02-06 DIAGNOSIS — R0602 Shortness of breath: Secondary | ICD-10-CM | POA: Diagnosis not present

## 2016-02-06 DIAGNOSIS — E1140 Type 2 diabetes mellitus with diabetic neuropathy, unspecified: Secondary | ICD-10-CM | POA: Diagnosis present

## 2016-02-06 DIAGNOSIS — Z961 Presence of intraocular lens: Secondary | ICD-10-CM | POA: Diagnosis present

## 2016-02-06 DIAGNOSIS — I509 Heart failure, unspecified: Secondary | ICD-10-CM

## 2016-02-06 DIAGNOSIS — J189 Pneumonia, unspecified organism: Secondary | ICD-10-CM

## 2016-02-06 DIAGNOSIS — E1121 Type 2 diabetes mellitus with diabetic nephropathy: Secondary | ICD-10-CM | POA: Diagnosis present

## 2016-02-06 DIAGNOSIS — I5031 Acute diastolic (congestive) heart failure: Secondary | ICD-10-CM | POA: Diagnosis present

## 2016-02-06 DIAGNOSIS — I252 Old myocardial infarction: Secondary | ICD-10-CM

## 2016-02-06 DIAGNOSIS — Z9842 Cataract extraction status, left eye: Secondary | ICD-10-CM

## 2016-02-06 DIAGNOSIS — I130 Hypertensive heart and chronic kidney disease with heart failure and stage 1 through stage 4 chronic kidney disease, or unspecified chronic kidney disease: Secondary | ICD-10-CM | POA: Diagnosis not present

## 2016-02-06 DIAGNOSIS — E1122 Type 2 diabetes mellitus with diabetic chronic kidney disease: Secondary | ICD-10-CM | POA: Diagnosis present

## 2016-02-06 DIAGNOSIS — Z7982 Long term (current) use of aspirin: Secondary | ICD-10-CM

## 2016-02-06 DIAGNOSIS — Z7902 Long term (current) use of antithrombotics/antiplatelets: Secondary | ICD-10-CM

## 2016-02-06 DIAGNOSIS — E1151 Type 2 diabetes mellitus with diabetic peripheral angiopathy without gangrene: Secondary | ICD-10-CM | POA: Diagnosis present

## 2016-02-06 DIAGNOSIS — Z79899 Other long term (current) drug therapy: Secondary | ICD-10-CM

## 2016-02-06 DIAGNOSIS — Z951 Presence of aortocoronary bypass graft: Secondary | ICD-10-CM

## 2016-02-06 DIAGNOSIS — G4700 Insomnia, unspecified: Secondary | ICD-10-CM | POA: Diagnosis present

## 2016-02-06 DIAGNOSIS — Z87891 Personal history of nicotine dependence: Secondary | ICD-10-CM

## 2016-02-06 DIAGNOSIS — E7800 Pure hypercholesterolemia, unspecified: Secondary | ICD-10-CM | POA: Diagnosis present

## 2016-02-06 DIAGNOSIS — F329 Major depressive disorder, single episode, unspecified: Secondary | ICD-10-CM | POA: Diagnosis present

## 2016-02-06 DIAGNOSIS — N184 Chronic kidney disease, stage 4 (severe): Secondary | ICD-10-CM | POA: Diagnosis present

## 2016-02-06 DIAGNOSIS — J9600 Acute respiratory failure, unspecified whether with hypoxia or hypercapnia: Secondary | ICD-10-CM | POA: Diagnosis present

## 2016-02-06 DIAGNOSIS — Z794 Long term (current) use of insulin: Secondary | ICD-10-CM

## 2016-02-06 DIAGNOSIS — F0390 Unspecified dementia without behavioral disturbance: Secondary | ICD-10-CM | POA: Diagnosis present

## 2016-02-06 DIAGNOSIS — Z9841 Cataract extraction status, right eye: Secondary | ICD-10-CM

## 2016-02-06 DIAGNOSIS — Z955 Presence of coronary angioplasty implant and graft: Secondary | ICD-10-CM

## 2016-02-06 DIAGNOSIS — Z853 Personal history of malignant neoplasm of breast: Secondary | ICD-10-CM

## 2016-02-06 DIAGNOSIS — J181 Lobar pneumonia, unspecified organism: Secondary | ICD-10-CM | POA: Diagnosis present

## 2016-02-06 NOTE — ED Notes (Signed)
Pt presents to the ED brought in by [facility]. EMS states that per daughter she has been progressively having worsening shortness of breath over the course of the last few days. HX of CHF and MI x 2 with stents. Upon arrival to scene she was 91% on RA, improved to 100% after albuterol nebulizer x 1 and was placed on CPAP in route. Exp. Wheezing present in route. Baseline orientation unknown.

## 2016-02-07 ENCOUNTER — Emergency Department (HOSPITAL_COMMUNITY): Payer: Medicare (Managed Care)

## 2016-02-07 ENCOUNTER — Encounter (HOSPITAL_COMMUNITY): Payer: Self-pay | Admitting: Emergency Medicine

## 2016-02-07 DIAGNOSIS — N184 Chronic kidney disease, stage 4 (severe): Secondary | ICD-10-CM | POA: Diagnosis present

## 2016-02-07 DIAGNOSIS — Z794 Long term (current) use of insulin: Secondary | ICD-10-CM | POA: Diagnosis not present

## 2016-02-07 DIAGNOSIS — Z79899 Other long term (current) drug therapy: Secondary | ICD-10-CM | POA: Diagnosis not present

## 2016-02-07 DIAGNOSIS — R0602 Shortness of breath: Secondary | ICD-10-CM | POA: Diagnosis present

## 2016-02-07 DIAGNOSIS — Z9841 Cataract extraction status, right eye: Secondary | ICD-10-CM | POA: Diagnosis not present

## 2016-02-07 DIAGNOSIS — F0390 Unspecified dementia without behavioral disturbance: Secondary | ICD-10-CM | POA: Diagnosis present

## 2016-02-07 DIAGNOSIS — E1140 Type 2 diabetes mellitus with diabetic neuropathy, unspecified: Secondary | ICD-10-CM | POA: Diagnosis present

## 2016-02-07 DIAGNOSIS — G4700 Insomnia, unspecified: Secondary | ICD-10-CM | POA: Diagnosis present

## 2016-02-07 DIAGNOSIS — I509 Heart failure, unspecified: Secondary | ICD-10-CM

## 2016-02-07 DIAGNOSIS — F329 Major depressive disorder, single episode, unspecified: Secondary | ICD-10-CM | POA: Diagnosis present

## 2016-02-07 DIAGNOSIS — Z853 Personal history of malignant neoplasm of breast: Secondary | ICD-10-CM | POA: Diagnosis not present

## 2016-02-07 DIAGNOSIS — I2510 Atherosclerotic heart disease of native coronary artery without angina pectoris: Secondary | ICD-10-CM | POA: Diagnosis not present

## 2016-02-07 DIAGNOSIS — E1122 Type 2 diabetes mellitus with diabetic chronic kidney disease: Secondary | ICD-10-CM | POA: Diagnosis present

## 2016-02-07 DIAGNOSIS — D631 Anemia in chronic kidney disease: Secondary | ICD-10-CM | POA: Diagnosis present

## 2016-02-07 DIAGNOSIS — G3183 Dementia with Lewy bodies: Secondary | ICD-10-CM | POA: Diagnosis not present

## 2016-02-07 DIAGNOSIS — Z955 Presence of coronary angioplasty implant and graft: Secondary | ICD-10-CM | POA: Diagnosis not present

## 2016-02-07 DIAGNOSIS — Z961 Presence of intraocular lens: Secondary | ICD-10-CM | POA: Diagnosis present

## 2016-02-07 DIAGNOSIS — E1151 Type 2 diabetes mellitus with diabetic peripheral angiopathy without gangrene: Secondary | ICD-10-CM | POA: Diagnosis present

## 2016-02-07 DIAGNOSIS — J181 Lobar pneumonia, unspecified organism: Secondary | ICD-10-CM | POA: Diagnosis not present

## 2016-02-07 DIAGNOSIS — Z9842 Cataract extraction status, left eye: Secondary | ICD-10-CM | POA: Diagnosis not present

## 2016-02-07 DIAGNOSIS — E1121 Type 2 diabetes mellitus with diabetic nephropathy: Secondary | ICD-10-CM | POA: Diagnosis present

## 2016-02-07 DIAGNOSIS — J9600 Acute respiratory failure, unspecified whether with hypoxia or hypercapnia: Secondary | ICD-10-CM | POA: Diagnosis present

## 2016-02-07 DIAGNOSIS — I10 Essential (primary) hypertension: Secondary | ICD-10-CM | POA: Diagnosis not present

## 2016-02-07 DIAGNOSIS — I252 Old myocardial infarction: Secondary | ICD-10-CM | POA: Diagnosis not present

## 2016-02-07 DIAGNOSIS — F0280 Dementia in other diseases classified elsewhere without behavioral disturbance: Secondary | ICD-10-CM | POA: Diagnosis not present

## 2016-02-07 DIAGNOSIS — I130 Hypertensive heart and chronic kidney disease with heart failure and stage 1 through stage 4 chronic kidney disease, or unspecified chronic kidney disease: Secondary | ICD-10-CM | POA: Diagnosis present

## 2016-02-07 DIAGNOSIS — E7800 Pure hypercholesterolemia, unspecified: Secondary | ICD-10-CM | POA: Diagnosis present

## 2016-02-07 DIAGNOSIS — Z951 Presence of aortocoronary bypass graft: Secondary | ICD-10-CM | POA: Diagnosis not present

## 2016-02-07 DIAGNOSIS — I5031 Acute diastolic (congestive) heart failure: Secondary | ICD-10-CM | POA: Diagnosis present

## 2016-02-07 LAB — CBC WITH DIFFERENTIAL/PLATELET
BASOS PCT: 0 %
Basophils Absolute: 0 10*3/uL (ref 0.0–0.1)
EOS ABS: 0.2 10*3/uL (ref 0.0–0.7)
Eosinophils Relative: 3 %
HEMATOCRIT: 30.9 % — AB (ref 36.0–46.0)
Hemoglobin: 10.1 g/dL — ABNORMAL LOW (ref 12.0–15.0)
LYMPHS ABS: 2.5 10*3/uL (ref 0.7–4.0)
Lymphocytes Relative: 30 %
MCH: 30 pg (ref 26.0–34.0)
MCHC: 32.7 g/dL (ref 30.0–36.0)
MCV: 91.7 fL (ref 78.0–100.0)
MONO ABS: 0.4 10*3/uL (ref 0.1–1.0)
MONOS PCT: 5 %
NEUTROS ABS: 5.2 10*3/uL (ref 1.7–7.7)
Neutrophils Relative %: 62 %
Platelets: 200 10*3/uL (ref 150–400)
RBC: 3.37 MIL/uL — ABNORMAL LOW (ref 3.87–5.11)
RDW: 14.8 % (ref 11.5–15.5)
WBC: 8.4 10*3/uL (ref 4.0–10.5)

## 2016-02-07 LAB — GLUCOSE, CAPILLARY
GLUCOSE-CAPILLARY: 199 mg/dL — AB (ref 65–99)
Glucose-Capillary: 327 mg/dL — ABNORMAL HIGH (ref 65–99)
Glucose-Capillary: 224 mg/dL — ABNORMAL HIGH (ref 65–99)
Glucose-Capillary: 162 mg/dL — ABNORMAL HIGH (ref 65–99)
Glucose-Capillary: 115 mg/dL — ABNORMAL HIGH (ref 65–99)

## 2016-02-07 LAB — URINE MICROSCOPIC-ADD ON

## 2016-02-07 LAB — BRAIN NATRIURETIC PEPTIDE
B Natriuretic Peptide: 290.6 pg/mL — ABNORMAL HIGH (ref 0.0–100.0)
B Natriuretic Peptide: 292.9 pg/mL — ABNORMAL HIGH (ref 0.0–100.0)

## 2016-02-07 LAB — MRSA PCR SCREENING: MRSA by PCR: NEGATIVE

## 2016-02-07 LAB — URINALYSIS, ROUTINE W REFLEX MICROSCOPIC
Bilirubin Urine: NEGATIVE
GLUCOSE, UA: 250 mg/dL — AB
Hgb urine dipstick: NEGATIVE
KETONES UR: NEGATIVE mg/dL
LEUKOCYTES UA: NEGATIVE
Nitrite: NEGATIVE
PH: 5.5 (ref 5.0–8.0)
Protein, ur: 100 mg/dL — AB
SPECIFIC GRAVITY, URINE: 1.017 (ref 1.005–1.030)

## 2016-02-07 LAB — I-STAT VENOUS BLOOD GAS, ED
Acid-base deficit: 4 mmol/L — ABNORMAL HIGH (ref 0.0–2.0)
BICARBONATE: 21.3 meq/L (ref 20.0–24.0)
O2 SAT: 69 %
PCO2 VEN: 37.6 mmHg — AB (ref 45.0–50.0)
PO2 VEN: 37 mmHg (ref 31.0–45.0)
TCO2: 22 mmol/L (ref 0–100)
pH, Ven: 7.362 — ABNORMAL HIGH (ref 7.250–7.300)

## 2016-02-07 LAB — I-STAT CHEM 8, ED
BUN: 28 mg/dL — ABNORMAL HIGH (ref 6–20)
CALCIUM ION: 1.05 mmol/L — AB (ref 1.12–1.23)
Chloride: 105 mmol/L (ref 101–111)
Creatinine, Ser: 2.2 mg/dL — ABNORMAL HIGH (ref 0.44–1.00)
Glucose, Bld: 305 mg/dL — ABNORMAL HIGH (ref 65–99)
HCT: 33 % — ABNORMAL LOW (ref 36.0–46.0)
Hemoglobin: 11.2 g/dL — ABNORMAL LOW (ref 12.0–15.0)
Potassium: 4.3 mmol/L (ref 3.5–5.1)
SODIUM: 142 mmol/L (ref 135–145)
TCO2: 22 mmol/L (ref 0–100)

## 2016-02-07 LAB — BASIC METABOLIC PANEL
ANION GAP: 11 (ref 5–15)
BUN: 28 mg/dL — AB (ref 6–20)
CHLORIDE: 104 mmol/L (ref 101–111)
CO2: 19 mmol/L — ABNORMAL LOW (ref 22–32)
Calcium: 8 mg/dL — ABNORMAL LOW (ref 8.9–10.3)
Creatinine, Ser: 2.29 mg/dL — ABNORMAL HIGH (ref 0.44–1.00)
GFR calc Af Amer: 21 mL/min — ABNORMAL LOW (ref >60)
GFR calc non Af Amer: 18 mL/min — ABNORMAL LOW (ref >60)
GLUCOSE: 353 mg/dL — AB (ref 65–99)
POTASSIUM: 4.9 mmol/L (ref 3.5–5.1)
Sodium: 134 mmol/L — ABNORMAL LOW (ref 135–145)

## 2016-02-07 LAB — I-STAT CG4 LACTIC ACID, ED: Lactic Acid, Venous: 1.56 mmol/L (ref 0.5–1.9)

## 2016-02-07 LAB — I-STAT TROPONIN, ED: TROPONIN I, POC: 0.02 ng/mL (ref 0.00–0.08)

## 2016-02-07 LAB — PROTIME-INR
INR: 1.06 (ref 0.00–1.49)
PROTHROMBIN TIME: 14 s (ref 11.6–15.2)

## 2016-02-07 MED ORDER — CLOPIDOGREL BISULFATE 75 MG PO TABS
75.0000 mg | ORAL_TABLET | Freq: Every day | ORAL | Status: DC
  Administered 2016-02-07 – 2016-02-11 (×5): 75 mg via ORAL
  Filled 2016-02-07 (×5): qty 1

## 2016-02-07 MED ORDER — ENOXAPARIN SODIUM 30 MG/0.3ML ~~LOC~~ SOLN
30.0000 mg | SUBCUTANEOUS | Status: DC
  Administered 2016-02-07 – 2016-02-10 (×4): 30 mg via SUBCUTANEOUS
  Filled 2016-02-07 (×4): qty 0.3

## 2016-02-07 MED ORDER — ATORVASTATIN CALCIUM 20 MG PO TABS
20.0000 mg | ORAL_TABLET | Freq: Every day | ORAL | Status: DC
  Administered 2016-02-07 – 2016-02-10 (×4): 20 mg via ORAL
  Filled 2016-02-07 (×4): qty 1

## 2016-02-07 MED ORDER — PREGABALIN 75 MG PO CAPS
75.0000 mg | ORAL_CAPSULE | Freq: Two times a day (BID) | ORAL | Status: DC
  Administered 2016-02-07: 75 mg via ORAL
  Filled 2016-02-07: qty 1

## 2016-02-07 MED ORDER — INSULIN DETEMIR 100 UNIT/ML ~~LOC~~ SOLN
22.0000 [IU] | Freq: Two times a day (BID) | SUBCUTANEOUS | Status: DC
  Filled 2016-02-07 (×3): qty 0.22

## 2016-02-07 MED ORDER — AZITHROMYCIN 500 MG IV SOLR
500.0000 mg | INTRAVENOUS | Status: DC
  Administered 2016-02-07 – 2016-02-09 (×2): 500 mg via INTRAVENOUS
  Filled 2016-02-07 (×3): qty 500

## 2016-02-07 MED ORDER — SODIUM CHLORIDE 0.9% FLUSH
3.0000 mL | Freq: Two times a day (BID) | INTRAVENOUS | Status: DC
  Administered 2016-02-07: 3 mL via INTRAVENOUS
  Administered 2016-02-07: 10 mL via INTRAVENOUS
  Administered 2016-02-08 – 2016-02-10 (×6): 3 mL via INTRAVENOUS

## 2016-02-07 MED ORDER — INSULIN DETEMIR 100 UNIT/ML FLEXPEN: 22.0000 [IU] | PEN_INJECTOR | Freq: Two times a day (BID) | SUBCUTANEOUS | Status: DC

## 2016-02-07 MED ORDER — CITALOPRAM HYDROBROMIDE 10 MG PO TABS
5.0000 mg | ORAL_TABLET | Freq: Every day | ORAL | Status: DC
  Administered 2016-02-08 – 2016-02-11 (×4): 5 mg via ORAL
  Filled 2016-02-07 (×4): qty 1

## 2016-02-07 MED ORDER — AMLODIPINE BESYLATE 10 MG PO TABS
10.0000 mg | ORAL_TABLET | Freq: Every day | ORAL | Status: DC
  Administered 2016-02-08 – 2016-02-11 (×4): 10 mg via ORAL
  Filled 2016-02-07 (×4): qty 1

## 2016-02-07 MED ORDER — ONDANSETRON HCL 4 MG PO TABS: 4.0000 mg | ORAL_TABLET | Freq: Four times a day (QID) | ORAL | Status: DC | PRN

## 2016-02-07 MED ORDER — INSULIN ASPART 100 UNIT/ML ~~LOC~~ SOLN
0.0000 [IU] | Freq: Every day | SUBCUTANEOUS | Status: DC
  Administered 2016-02-09: 4 [IU] via SUBCUTANEOUS

## 2016-02-07 MED ORDER — MEMANTINE HCL 10 MG PO TABS
10.0000 mg | ORAL_TABLET | Freq: Two times a day (BID) | ORAL | Status: DC
  Administered 2016-02-08 – 2016-02-11 (×8): 10 mg via ORAL
  Filled 2016-02-07 (×8): qty 1

## 2016-02-07 MED ORDER — ACETAMINOPHEN 650 MG RE SUPP: 650.0000 mg | Freq: Four times a day (QID) | RECTAL | Status: DC | PRN

## 2016-02-07 MED ORDER — INSULIN DETEMIR 100 UNIT/ML ~~LOC~~ SOLN
25.0000 [IU] | Freq: Two times a day (BID) | SUBCUTANEOUS | Status: DC
  Administered 2016-02-08 – 2016-02-11 (×8): 25 [IU] via SUBCUTANEOUS
  Filled 2016-02-07 (×9): qty 0.25

## 2016-02-07 MED ORDER — INSULIN ASPART 100 UNIT/ML ~~LOC~~ SOLN
0.0000 [IU] | Freq: Three times a day (TID) | SUBCUTANEOUS | Status: DC
  Administered 2016-02-07: 3 [IU] via SUBCUTANEOUS
  Administered 2016-02-07: 11 [IU] via SUBCUTANEOUS
  Administered 2016-02-07: 3 [IU] via SUBCUTANEOUS
  Administered 2016-02-08 – 2016-02-09 (×3): 2 [IU] via SUBCUTANEOUS
  Administered 2016-02-09: 5 [IU] via SUBCUTANEOUS
  Administered 2016-02-10: 3 [IU] via SUBCUTANEOUS
  Administered 2016-02-11: 2 [IU] via SUBCUTANEOUS

## 2016-02-07 MED ORDER — AMLODIPINE BESYLATE 5 MG PO TABS
5.0000 mg | ORAL_TABLET | Freq: Every day | ORAL | Status: DC
  Administered 2016-02-07: 5 mg via ORAL
  Filled 2016-02-07: qty 1

## 2016-02-07 MED ORDER — FUROSEMIDE 10 MG/ML IJ SOLN
80.0000 mg | Freq: Once | INTRAMUSCULAR | Status: AC
  Administered 2016-02-07: 80 mg via INTRAVENOUS
  Filled 2016-02-07: qty 8

## 2016-02-07 MED ORDER — DEXTROSE 5 % IV SOLN
2.0000 g | Freq: Once | INTRAVENOUS | Status: AC
  Administered 2016-02-07: 2 g via INTRAVENOUS
  Filled 2016-02-07: qty 2

## 2016-02-07 MED ORDER — PREGABALIN 75 MG PO CAPS
75.0000 mg | ORAL_CAPSULE | Freq: Every day | ORAL | Status: DC
  Administered 2016-02-08 – 2016-02-09 (×2): 75 mg via ORAL
  Filled 2016-02-07 (×2): qty 1

## 2016-02-07 MED ORDER — FUROSEMIDE 10 MG/ML IJ SOLN
40.0000 mg | Freq: Two times a day (BID) | INTRAMUSCULAR | Status: DC
  Administered 2016-02-07 – 2016-02-10 (×7): 40 mg via INTRAVENOUS
  Filled 2016-02-07 (×8): qty 4

## 2016-02-07 MED ORDER — PATIROMER SORBITEX CALCIUM 8.4 G PO PACK
8.4000 g | PACK | ORAL | Status: DC
  Administered 2016-02-07 – 2016-02-08 (×2): 8.4 g via ORAL
  Filled 2016-02-07 (×2): qty 4

## 2016-02-07 MED ORDER — ONDANSETRON HCL 4 MG/2ML IJ SOLN: 4.0000 mg | Freq: Four times a day (QID) | INTRAMUSCULAR | Status: DC | PRN

## 2016-02-07 MED ORDER — DONEPEZIL HCL 10 MG PO TABS: 10.0000 mg | ORAL_TABLET | Freq: Every day | ORAL | Status: DC

## 2016-02-07 MED ORDER — IPRATROPIUM-ALBUTEROL 0.5-2.5 (3) MG/3ML IN SOLN: 3.0000 mL | RESPIRATORY_TRACT | Status: DC | PRN

## 2016-02-07 MED ORDER — ACETAMINOPHEN 325 MG PO TABS: 650.0000 mg | ORAL_TABLET | Freq: Four times a day (QID) | ORAL | Status: DC | PRN

## 2016-02-07 MED ORDER — ASPIRIN 81 MG PO CHEW
81.0000 mg | CHEWABLE_TABLET | Freq: Every day | ORAL | Status: DC
  Administered 2016-02-07 – 2016-02-11 (×5): 81 mg via ORAL
  Filled 2016-02-07 (×5): qty 1

## 2016-02-07 MED ORDER — SENNOSIDES-DOCUSATE SODIUM 8.6-50 MG PO TABS: 1.0000 | ORAL_TABLET | Freq: Every evening | ORAL | Status: DC | PRN

## 2016-02-07 MED ORDER — CITALOPRAM HYDROBROMIDE 20 MG PO TABS
10.0000 mg | ORAL_TABLET | Freq: Every day | ORAL | Status: DC
  Administered 2016-02-07: 10 mg via ORAL
  Filled 2016-02-07: qty 1

## 2016-02-07 MED ORDER — DEXTROSE 5 % IV SOLN
500.0000 mg | Freq: Once | INTRAVENOUS | Status: AC
  Administered 2016-02-07: 500 mg via INTRAVENOUS
  Filled 2016-02-07: qty 500

## 2016-02-07 MED ORDER — PANTOPRAZOLE SODIUM 40 MG PO TBEC
40.0000 mg | DELAYED_RELEASE_TABLET | Freq: Every day | ORAL | Status: DC
  Administered 2016-02-07 – 2016-02-11 (×5): 40 mg via ORAL
  Filled 2016-02-07 (×5): qty 1

## 2016-02-07 MED ORDER — DEXTROSE 5 % IV SOLN
1.0000 g | INTRAVENOUS | Status: DC
  Administered 2016-02-07 – 2016-02-08 (×2): 1 g via INTRAVENOUS
  Filled 2016-02-07 (×4): qty 10

## 2016-02-07 NOTE — ED Provider Notes (Addendum)
CSN: [id number]     Arrival date & time 02/06/16  2352 History   By signing my name below, I, Evelene Croon, attest that this documentation has been prepared under the direction and in the presence of Everlene Balls, MD . Electronically Signed: Evelene Croon, Scribe. 02/07/2016. 12:10 AM.    Chief Complaint  Patient presents with  . Shortness of Breath  . Respiratory Distress   LEVEL 5 CAVEAT DUE TO ACUITY OF CONDITION  The history is provided by the EMS personnel. No language interpreter was used.   HPI Comments:  [name] is a 82 y.o. female with a history of MI x2, coronary stents, and CHF who presents to the Emergency Department via EMS for respiratory distress. Per EMS, pt's daughter stated that the pt was SOB this AM but improved throughout the day, however she progressively worsened this evening. Upon arrival to pt, EMS noted O2 saturation of 91% on RA. Pt is not on home oxygen. Pt received 1 albuterol treatment before being placed on CPAP which improved her O2 saturation to 100%. EMS notes initial BP of 230/100 and final BP of 181/71. EMS also place 20 gauge IV in the left forearm.   Past Medical History  Diagnosis Date  . 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 ANEMIA   . DEPRESSION   . HYPERTENSION   . PVD   . INSOMNIA, CHRONIC   . Dementia   . DIABETES MELLITUS, TYPE II     renal, neuro - peripheral neuropathy  . Hypercholesteremia   . Chronic kidney disease (CKD), stage IV (severe) ([place])   . Anemia of chronic disease   . Angina   . Non-Q wave myocardial infarction ([place]) 10/2009    /E-chart  . Inferior MI ([place]) 2000    "quiet"  . Breast cancer ([place]) 2000    right  . Loose stools   . Neurodermatitis    Past Surgical History  Procedure Laterality Date  . Capsulectomy  12/27/2010    L eye  . Breast lumpectomy  2000    right  . Cataract extraction w/ intraocular lens  implant, bilateral    . Coronary artery bypass graft  2000    CABG X1  . Coronary angioplasty  with stent placement      "twice"  . Eye surgery      Cataracts (R) eye 06/1999 & (L) eye 09/1999- [place]  . Combined hysteroscopy diagnostic / d&c  07/2004    /e-chart  . Dilation and curettage of uterus    . Cardiac surgery    . Left heart catheterization with coronary angiogram N/A 11/22/2011    Procedure: LEFT HEART CATHETERIZATION WITH CORONARY ANGIOGRAM;  Surgeon: Clent Demark, MD;  Location: [facility] CATH LAB;  Service: Cardiovascular;  Laterality: N/A;  . Left heart catheterization with coronary angiogram N/A 10/03/2012    Procedure: LEFT HEART CATHETERIZATION WITH CORONARY ANGIOGRAM;  Surgeon: Clent Demark, MD;  Location: [facility] CATH LAB;  Service: Cardiovascular;  Laterality: N/A;   Family History  Problem Relation Age of Onset  . Coronary artery disease Father    Social History  Substance Use Topics  . Smoking status: Former Smoker -- 1.00 packs/day for 20 years    Types: Cigarettes  . Smokeless tobacco: Never Used     Comment: 11/21/11 "quit smoking 30-40 years ago  . Alcohol Use: No     Comment: 11/21/11 "used to drink quit alot; last drink of alcohol maybe  30 years ago"   OB History    No data available     Review of Systems  Unable to perform ROS: Acuity of condition   Allergies  Ace inhibitors  Home Medications   Prior to Admission medications   Medication Sig Start Date End Date Taking? Authorizing Provider  acetaminophen (TYLENOL) 500 MG tablet Take 1,000 mg by mouth every 6 (six) hours as needed for mild pain.    Historical Provider, MD  amLODipine (NORVASC) 5 MG tablet Take 1 tablet (5 mg total) by mouth daily. Patient taking differently: Take 5 mg by mouth daily with breakfast.  12/29/14   Rowe Clack, MD  aspirin 81 MG tablet Take 1 tablet (81 mg total) by mouth daily. 11/23/11   Charolette Forward, MD  atorvastatin (LIPITOR) 20 MG tablet TAKE 1 TABLET BY MOUTH ONCE DAILY 05/05/15   Hoyt Koch, MD  citalopram (CELEXA) 10 MG tablet Take 10 mg by  mouth daily.    Historical Provider, MD  clopidogrel (PLAVIX) 75 MG tablet Take 1 tablet (75 mg total) by mouth daily. 12/29/14   Rowe Clack, MD  donepezil (ARICEPT) 10 MG tablet Take 1 tablet (10 mg total) by mouth at bedtime. Must establish with NEW PCP for additional refills. 07/10/15   Rowe Clack, MD  furosemide (LASIX) 40 MG tablet TAKE ONE TABLET BY MOUTH TWICE DAILY 05/05/15   Hoyt Koch, MD  Insulin Detemir (LEVEMIR FLEXTOUCH) 100 UNIT/ML Pen Inject 22 Units into the skin 2 (two) times daily between meals. 07/19/15   Domenic Polite, MD  insulin regular (HUMULIN R) 100 units/mL injection Inject 0.05 mLs (5 Units total) into the skin 3 (three) times daily before meals. Patient taking differently: Inject 10 Units into the skin 3 (three) times daily before meals.  07/19/15   Domenic Polite, MD  LYRICA 75 MG capsule TAKE 1 CAPSULE BY MOUTH TWICE DAILY 08/07/15   Hoyt Koch, MD  pantoprazole (PROTONIX) 40 MG tablet Take 1 tablet (40 mg total) by mouth daily at 6 (six) AM. 10/04/12   Charolette Forward, MD  patiromer (VELTASSA) 8.4 g packet Take 1 packet (8.4 g total) by mouth daily. 10/06/15   Pattricia Boss, MD  triamcinolone cream (KENALOG) 0.1 % APPLY TOPICALLY TWICE DAILY Patient taking differently: APPLY TOPICALLY TWICE DAILY as needed for spots on leg 06/15/15   Rowe Clack, MD   Pulse 75  Resp 27  SpO2 100% Physical Exam  Constitutional: She is oriented to person, place, and time. She appears well-developed and well-nourished. She appears distressed.  HENT:  Head: Normocephalic and atraumatic.  Nose: Nose normal.  Mouth/Throat: Oropharynx is clear and moist. No oropharyngeal exudate.  Eyes: Conjunctivae and EOM are normal. Pupils are equal, round, and reactive to light. No scleral icterus.  Neck: Normal range of motion. Neck supple. No JVD present. No tracheal deviation present. No thyromegaly present.  Cardiovascular: Normal rate, regular rhythm and  normal heart sounds.  Exam reveals no gallop and no friction rub.   No murmur heard. Pulmonary/Chest: She has no wheezes. She exhibits no tenderness.  BiPAP mask in place Shallow respirations but otherwise clear  Abdominal: Soft. Bowel sounds are normal. She exhibits no distension and no mass. There is no tenderness. There is no rebound and no guarding.  Musculoskeletal: Normal range of motion. She exhibits edema (trace). She exhibits no tenderness.  Lymphadenopathy:    She has no cervical adenopathy.  Neurological: She is alert and oriented to  person, place, and time. No cranial nerve deficit. She exhibits normal muscle tone.  Skin: Skin is warm and dry. No rash noted. No erythema. No pallor.  Nursing note and vitals reviewed.   ED Course  Procedures   DIAGNOSTIC STUDIES:  Oxygen Saturation is 100% on CPAP, normal by my interpretation.     Labs Review Labs Reviewed  CBC WITH DIFFERENTIAL/PLATELET - Abnormal; Notable for the following:    RBC 3.37 (*)    Hemoglobin 10.1 (*)    HCT 30.9 (*)    All other components within normal limits  BRAIN NATRIURETIC PEPTIDE - Abnormal; Notable for the following:    B Natriuretic Peptide 290.6 (*)    All other components within normal limits  URINALYSIS, ROUTINE W REFLEX MICROSCOPIC (NOT AT [facility]) - Abnormal; Notable for the following:    Glucose, UA 250 (*)    Protein, ur 100 (*)    All other components within normal limits  URINE MICROSCOPIC-ADD ON - Abnormal; Notable for the following:    Squamous Epithelial / LPF 0-5 (*)    Bacteria, UA FEW (*)    Casts HYALINE CASTS (*)    All other components within normal limits  I-STAT CHEM 8, ED - Abnormal; Notable for the following:    BUN 28 (*)    Creatinine, Ser 2.20 (*)    Glucose, Bld 305 (*)    Calcium, Ion 1.05 (*)    Hemoglobin 11.2 (*)    HCT 33.0 (*)    All other components within normal limits  I-STAT VENOUS BLOOD GAS, ED - Abnormal; Notable for the following:    pH, Ven 7.362  (*)    pCO2, Ven 37.6 (*)    Acid-base deficit 4.0 (*)    All other components within normal limits  CULTURE, BLOOD (ROUTINE X 2)  CULTURE, BLOOD (ROUTINE X 2)  PROTIME-INR  I-STAT TROPOININ, ED  I-STAT CG4 LACTIC ACID, ED    Imaging Review Dg Chest Port 1 View  02/07/2016  CLINICAL DATA:  82 year old female with shortness of for EXAM: PORTABLE CHEST 1 VIEW COMPARISON:  Chest radiograph dated 11/18/2014 FINDINGS: Single portable view of the chest demonstrates diffuse interstitial prominence and nodularity, likely congestive changes. An area of increased density at the right lung base may represent superimposed pneumonia. Clinical correlation and follow-up recommended. There is blunting of the right costophrenic angle likely related to airspace disease. Small right pleural effusion is not excluded. There is no pneumothorax. Stable cardiomegaly. No acute fracture. IMPRESSION: Cardiomegaly with congestive changes. Right lung base hazy airspace density may represent superimposed pneumonia. Clinical correlation and follow-up recommended. Electronically Signed   By: Anner Crete M.D.   On: 02/07/2016 00:25   I have personally reviewed and evaluated these images and lab results as part of my medical decision-making.   EKG Interpretation   Date/Time:  Saturday February 06 2016 23:55:40 EDT Ventricular Rate:  75 PR Interval:    QRS Duration: 119 QT Interval:  439 QTC Calculation: 491 R Axis:   -33 Text Interpretation:  Sinus rhythm Interpretation limited secondary to  artifact no changes seen Baseline wander in lead(s) I II aVR aVF Confirmed  by Glynn Octave [id number]) on 02/07/2016 12:16:00 AM      MDM   Final diagnoses:  None    Patient presents to the ED for severe SOB.  She has a history of CHF, but lungs are clear and there is only minimal swelling in her legs.  EMS reports initial  SBP of 230, perhaps a component of flash pulmonary edema.  Patient improved on bipap.  Awaiting  CXR and lab results to initiate further treatment. I anticipate admission.  12:53am :  I spoke with Dr. Claria Dice who will admit for further care.  1:02 AM CXR reveals CHF and PNA.  She was given IV lasix, cultures drawn, and she was treated with ceftriaxone/azithromycin.  Ispoke with the hospitalist who agrees to admit for further care.  She can likely transition to [place]. Will attempt to wean.   CRITICAL CARE Performed by: Everlene Balls, MD Total critical care time: 40 minutes - resp distress on bipap Critical care time was exclusive of separately billable procedures and treating other patients. Critical care was necessary to treat or prevent imminent or life-threatening deterioration. Critical care was time spent personally by me on the following activities: development of treatment plan with patient and/or surrogate as well as nursing, discussions with consultants, evaluation of patient's response to treatment, examination of patient, obtaining history from patient or surrogate, ordering and performing treatments and interventions, ordering and review of laboratory studies, ordering and review of radiographic studies, pulse oximetry and re-evaluation of patient's condition.   I personally performed the services described in this documentation, which was scribed in my presence. The recorded information has been reviewed and is accurate.      Everlene Balls, MD 02/07/16 0222

## 2016-02-07 NOTE — H&P (Signed)
PCP:   Nichole Grant, MD   Chief Complaint:  Shortness of breath  HPI: This 82 year old female lives at home with her daughter. She developed shortness of breath. She denies fevers or chills. She denies any wheezing. She denies any lower extremity edema. She denies any nausea, vomiting, abdominal pain or diarrhea. She came to ER because of her shortness of breath. In the the patient is on BiPAP and not feeling very talkative. She is awake and alert. No family members are present at bedside.  Review of Systems:  The patient denies anorexia, fever, weight loss,, vision loss, decreased hearing, hoarseness, chest pain, syncope, dyspnea on exertion, peripheral edema, balance deficits, hemoptysis, abdominal pain, melena, hematochezia, severe indigestion/heartburn, hematuria, incontinence, genital sores,  weakness, suspicious skin lesions, transient blindness, difficulty walking, depression, unusual weight change, abnormal bleeding, enlarged lymph nodes, angioedema, and breast masses.  Past Medical History: Past Medical History  Diagnosis Date  . 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 ANEMIA   . DEPRESSION   . HYPERTENSION   . PVD   . INSOMNIA, CHRONIC   . Dementia   . DIABETES MELLITUS, TYPE II     renal, neuro - peripheral neuropathy  . Hypercholesteremia   . Chronic kidney disease (CKD), stage IV (severe) ([place])   . Anemia of chronic disease   . Angina   . Non-Q wave myocardial infarction ([place]) 10/2009    /E-chart  . Inferior MI ([place]) 2000    "quiet"  . Breast cancer ([place]) 2000    right  . Loose stools   . Neurodermatitis    Past Surgical History  Procedure Laterality Date  . Capsulectomy  12/27/2010    L eye  . Breast lumpectomy  2000    right  . Cataract extraction w/ intraocular lens  implant, bilateral    . Coronary artery bypass graft  2000    CABG X1  . Coronary angioplasty with stent placement      "twice"  . Eye surgery      Cataracts (R) eye 06/1999 & (L) eye  09/1999- [place]  . Combined hysteroscopy diagnostic / d&c  07/2004    /e-chart  . Dilation and curettage of uterus    . Cardiac surgery    . Left heart catheterization with coronary angiogram N/A 11/22/2011    Procedure: LEFT HEART CATHETERIZATION WITH CORONARY ANGIOGRAM;  Surgeon: Clent Demark, MD;  Location: [facility] CATH LAB;  Service: Cardiovascular;  Laterality: N/A;  . Left heart catheterization with coronary angiogram N/A 10/03/2012    Procedure: LEFT HEART CATHETERIZATION WITH CORONARY ANGIOGRAM;  Surgeon: Clent Demark, MD;  Location: [facility] CATH LAB;  Service: Cardiovascular;  Laterality: N/A;    Medications: Prior to Admission medications   Medication Sig Start Date End Date Taking? Authorizing Provider  acetaminophen (TYLENOL) 500 MG tablet Take 1,000 mg by mouth every 6 (six) hours as needed for mild pain.    Historical Provider, MD  amLODipine (NORVASC) 5 MG tablet Take 1 tablet (5 mg total) by mouth daily. Patient taking differently: Take 5 mg by mouth daily with breakfast.  12/29/14   Rowe Clack, MD  aspirin 81 MG tablet Take 1 tablet (81 mg total) by mouth daily. 11/23/11   Charolette Forward, MD  atorvastatin (LIPITOR) 20 MG tablet TAKE 1 TABLET BY MOUTH ONCE DAILY 05/05/15   Hoyt Koch, MD  citalopram (CELEXA) 10 MG tablet Take 10 mg by mouth daily.    Historical  Provider, MD  clopidogrel (PLAVIX) 75 MG tablet Take 1 tablet (75 mg total) by mouth daily. 12/29/14   Rowe Clack, MD  donepezil (ARICEPT) 10 MG tablet Take 1 tablet (10 mg total) by mouth at bedtime. Must establish with NEW PCP for additional refills. 07/10/15   Rowe Clack, MD  furosemide (LASIX) 40 MG tablet TAKE ONE TABLET BY MOUTH TWICE DAILY 05/05/15   Hoyt Koch, MD  Insulin Detemir (LEVEMIR FLEXTOUCH) 100 UNIT/ML Pen Inject 22 Units into the skin 2 (two) times daily between meals. 07/19/15   Domenic Polite, MD  insulin regular (HUMULIN R) 100 units/mL injection Inject 0.05 mLs (5  Units total) into the skin 3 (three) times daily before meals. Patient taking differently: Inject 10 Units into the skin 3 (three) times daily before meals.  07/19/15   Domenic Polite, MD  LYRICA 75 MG capsule TAKE 1 CAPSULE BY MOUTH TWICE DAILY 08/07/15   Hoyt Koch, MD  pantoprazole (PROTONIX) 40 MG tablet Take 1 tablet (40 mg total) by mouth daily at 6 (six) AM. 10/04/12   Charolette Forward, MD  patiromer (VELTASSA) 8.4 g packet Take 1 packet (8.4 g total) by mouth daily. 10/06/15   Pattricia Boss, MD  triamcinolone cream (KENALOG) 0.1 % APPLY TOPICALLY TWICE DAILY Patient taking differently: APPLY TOPICALLY TWICE DAILY as needed for spots on leg 06/15/15   Rowe Clack, MD    Allergies:   Allergies  Allergen Reactions  . Ace Inhibitors Cough    Social History:  reports that she has quit smoking. Her smoking use included Cigarettes. She has a 20 pack-year smoking history. She has never used smokeless tobacco. She reports that she does not drink alcohol or use illicit drugs.  Family History: Family History  Problem Relation Age of Onset  . Coronary artery disease Father     Physical Exam: Filed Vitals:   02/07/16 0005 02/07/16 0015 02/07/16 0030 02/07/16 0045  BP:  153/61 170/63 167/66  Pulse:  69 70 68  Temp:      TempSrc:      Resp:  17 25 16   Height:      Weight:      SpO2: 100% 100% 100% 100%    General:  Alert and oriented times three, well developed and nourished, no acute distress Eyes: PERRLA, pink conjunctiva, no scleral icterus ENT: Moist oral mucosa, neck supple, no thyromegaly Lungs: clear to ascultation, no wheeze, no crackles, no use of accessory muscles Cardiovascular: regular rate and rhythm, no regurgitation, no gallops, no murmurs. No carotid bruits, no JVD Abdomen: soft, positive BS, non-tender, non-distended, no organomegaly, not an acute abdomen GU: not examined Neuro: CN II - XII grossly intact, sensation intact Musculoskeletal: strength  5/5 all extremities, no clubbing, cyanosis or edema Skin: no rash, no subcutaneous crepitation, no decubitus Psych: appropriate patient   Labs on Admission:   Recent Labs  02/07/16 0020  NA 142  K 4.3  CL 105  GLUCOSE 305*  BUN 28*  CREATININE 2.20*   No results for input(s): AST, ALT, ALKPHOS, BILITOT, PROT, ALBUMIN in the last 72 hours. No results for input(s): LIPASE, AMYLASE in the last 72 hours.  Recent Labs  02/07/16 0010 02/07/16 0020  WBC 8.4  --   NEUTROABS 5.2  --   HGB 10.1* 11.2*  HCT 30.9* 33.0*  MCV 91.7  --   PLT 200  --    No results for input(s): CKTOTAL, CKMB, CKMBINDEX, TROPONINI in the last  72 hours. Invalid input(s): POCBNP No results for input(s): DDIMER in the last 72 hours. No results for input(s): HGBA1C in the last 72 hours. No results for input(s): CHOL, HDL, LDLCALC, TRIG, CHOLHDL, LDLDIRECT in the last 72 hours. No results for input(s): TSH, T4TOTAL, T3FREE, THYROIDAB in the last 72 hours.  Invalid input(s): FREET3 No results for input(s): VITAMINB12, FOLATE, FERRITIN, TIBC, IRON, RETICCTPCT in the last 72 hours.  Micro Results: No results found for this or any previous visit (from the past 240 hour(s)).   Radiological Exams on Admission: Dg Chest Port 1 View  02/07/2016  CLINICAL DATA:  82 year old female with shortness of for EXAM: PORTABLE CHEST 1 VIEW COMPARISON:  Chest radiograph dated 11/18/2014 FINDINGS: Single portable view of the chest demonstrates diffuse interstitial prominence and nodularity, likely congestive changes. An area of increased density at the right lung base may represent superimposed pneumonia. Clinical correlation and follow-up recommended. There is blunting of the right costophrenic angle likely related to airspace disease. Small right pleural effusion is not excluded. There is no pneumothorax. Stable cardiomegaly. No acute fracture. IMPRESSION: Cardiomegaly with congestive changes. Right lung base hazy airspace  density may represent superimposed pneumonia. Clinical correlation and follow-up recommended. Electronically Signed   By: Anner Crete M.D.   On: 02/07/2016 00:25    Assessment/Plan Present on Admission:  Acute diastolic heart failure -Admit to stepdown, continue BiPAP -Duonebs as needed, BNP in a.m. -Patient last echo showed a EF of greater than 65% -i/o's, daily weights  Pneumonia , likely gram-negative organisms -Blood cultures ordered, IV antibiotics Rocephin and azithromycin ordered  Hypertension -Stable, continue home medications  Coronary artery disease -Stable, monitor  Diabetes mellitus type 2 -Stable, ADA diet -Resume home medications  Dementia -Stable,  Chronic kidney disease stage IV -Stable, at baseline  History of breast cancer -In remission  [name] 02/07/2016, 1:16 AM

## 2016-02-07 NOTE — ED Notes (Signed)
Pt removed from bipap per MD order, placed on 4L [place]. Pt tolerating well will continue to monitor.

## 2016-02-07 NOTE — Progress Notes (Signed)
PROGRESS NOTE    Kendriana Forston  [id number] DOB: 01-Dec-1928 DOA: 02/06/2016 PCP: [name], MD  Brief Narrative: This 82 year old female with CAD, Dementia, CHF, DM, CKD 4, Anemia lives at home with her daughter. She developed shortness of breath for few days. She came to ER because of her shortness of breath CXR with CHF and pneumonia  Assessment & Plan: Acute diastolic heart failure -off BiPAP -continue IV lasix 40mg  q12, incontinent and urine output inaccurate -last echo showed a EF of greater than 65% -daily weights -improving  Possible Community acquired pneumonia -continue Rocephin and azithromycin -FU Blood Cx  Hypertension -Stable, continue home medications  Coronary artery disease -Stable, continue ASA/plavix/statin  Diabetes mellitus type 2 -uncontrolled, increase levemir, SSI  Dementia -Stable, continue Aricept  Chronic kidney disease stage IV -better than baseline which is 2.6  History of breast cancer -In remission  Full Code Communication: no family at bedside Dispo: Home in 1-2days  Subjective: Breathing better, very poor historian  Objective: Filed Vitals:   02/07/16 0225 02/07/16 0300 02/07/16 0600 02/07/16 0750  BP: 180/57 170/56 182/56 164/55  Pulse: 84 68 66 67  Temp:    98.4 F (36.9 C)  TempSrc:    Oral  Resp: 22 15 14 19   Height: 5\' 3"  (1.6 m)     Weight: 73.7 kg (162 lb 7.7 oz)     SpO2: 96% 98% 100% 98%    Intake/Output Summary (Last 24 hours) at 02/07/16 1040 Last data filed at 02/07/16 0630  Gross per 24 hour  Intake      0 ml  Output    150 ml  Net   -150 ml   Filed Weights   02/07/16 0002 02/07/16 0225  Weight: 72.576 kg (160 lb) 73.7 kg (162 lb 7.7 oz)    Examination:  General exam: Appears calm and comfortable, oriented to self and partly to place only Respiratory system: Clear to auscultation. Respiratory effort normal. Cardiovascular system: S1 & S2 heard, RRR. No JVD, murmurs, rubs, gallops or clicks.  No pedal edema. Gastrointestinal system: Abdomen is nondistended, soft and nontender. No organomegaly or masses felt. Normal bowel sounds heard. Central nervous system: Alert and oriented. No focal neurological deficits. Extremities: Symmetric 5 x 5 power. Skin: No rashes, lesions or ulcers Psychiatry: Judgement and insight appear normal. Mood & affect appropriate.     Data Reviewed: I have personally reviewed following labs and imaging studies  CBC:  Recent Labs Lab 02/07/16 0010 02/07/16 0020  WBC 8.4  --   NEUTROABS 5.2  --   HGB 10.1* 11.2*  HCT 30.9* 33.0*  MCV 91.7  --   PLT 200  --    Basic Metabolic Panel:  Recent Labs Lab 02/07/16 0020 02/07/16 0642  NA 142 134*  K 4.3 4.9  CL 105 104  CO2  --  19*  GLUCOSE 305* 353*  BUN 28* 28*  CREATININE 2.20* 2.29*  CALCIUM  --  8.0*   GFR: Estimated Creatinine Clearance: 17.3 mL/min (by C-G formula based on Cr of 2.29). Liver Function Tests: No results for input(s): AST, ALT, ALKPHOS, BILITOT, PROT, ALBUMIN in the last 168 hours. No results for input(s): LIPASE, AMYLASE in the last 168 hours. No results for input(s): AMMONIA in the last 168 hours. Coagulation Profile:  Recent Labs Lab 02/07/16 0010  INR 1.06   Cardiac Enzymes: No results for input(s): CKTOTAL, CKMB, CKMBINDEX, TROPONINI in the last 168 hours. BNP (last 3 results) No results for input(s): PROBNP  in the last 8760 hours. HbA1C: No results for input(s): HGBA1C in the last 72 hours. CBG:  Recent Labs Lab 02/07/16 0752  GLUCAP 327*   Lipid Profile: No results for input(s): CHOL, HDL, LDLCALC, TRIG, CHOLHDL, LDLDIRECT in the last 72 hours. Thyroid Function Tests: No results for input(s): TSH, T4TOTAL, FREET4, T3FREE, THYROIDAB in the last 72 hours. Anemia Panel: No results for input(s): VITAMINB12, FOLATE, FERRITIN, TIBC, IRON, RETICCTPCT in the last 72 hours. Urine analysis:    Component Value Date/Time   COLORURINE YELLOW 02/07/2016  0025   APPEARANCEUR CLEAR 02/07/2016 0025   LABSPEC 1.017 02/07/2016 0025   PHURINE 5.5 02/07/2016 0025   GLUCOSEU 250* 02/07/2016 0025   GLUCOSEU >=1000 08/24/2012 1148   HGBUR NEGATIVE 02/07/2016 0025   BILIRUBINUR NEGATIVE 02/07/2016 0025   KETONESUR NEGATIVE 02/07/2016 0025   PROTEINUR 100* 02/07/2016 0025   UROBILINOGEN 0.2 05/26/2015 1654   NITRITE NEGATIVE 02/07/2016 0025   LEUKOCYTESUR NEGATIVE 02/07/2016 0025   Sepsis Labs: @LABRCNTIP (procalcitonin:4,lacticidven:4)  ) Recent Results (from the past 240 hour(s))  MRSA PCR Screening     Status: None   Collection Time: 02/07/16  2:30 AM  Result Value Ref Range Status   MRSA by PCR NEGATIVE NEGATIVE Final    Comment:        The GeneXpert MRSA Assay (FDA approved for NASAL specimens only), is one component of a comprehensive MRSA colonization surveillance program. It is not intended to diagnose MRSA infection nor to guide or monitor treatment for MRSA infections.          Radiology Studies: Dg Chest Port 1 View  02/07/2016  CLINICAL DATA:  82 year old female with shortness of for EXAM: PORTABLE CHEST 1 VIEW COMPARISON:  Chest radiograph dated 11/18/2014 FINDINGS: Single portable view of the chest demonstrates diffuse interstitial prominence and nodularity, likely congestive changes. An area of increased density at the right lung base may represent superimposed pneumonia. Clinical correlation and follow-up recommended. There is blunting of the right costophrenic angle likely related to airspace disease. Small right pleural effusion is not excluded. There is no pneumothorax. Stable cardiomegaly. No acute fracture. IMPRESSION: Cardiomegaly with congestive changes. Right lung base hazy airspace density may represent superimposed pneumonia. Clinical correlation and follow-up recommended. Electronically Signed   By: Anner Crete M.D.   On: 02/07/2016 00:25        Scheduled Meds: . amLODipine  5 mg Oral Daily  .  aspirin  81 mg Oral Daily  . atorvastatin  20 mg Oral q1800  . azithromycin  500 mg Intravenous Q24H  . cefTRIAXone (ROCEPHIN)  IV  1 g Intravenous Q24H  . citalopram  10 mg Oral Daily  . clopidogrel  75 mg Oral Daily  . donepezil  10 mg Oral QHS  . enoxaparin (LOVENOX) injection  30 mg Subcutaneous Q24H  . furosemide  40 mg Intravenous BID  . insulin aspart  0-15 Units Subcutaneous TID WC  . insulin aspart  0-5 Units Subcutaneous QHS  . insulin detemir  22 Units Subcutaneous BID  . pantoprazole  40 mg Oral Daily  . patiromer  8.4 g Oral Q24H  . pregabalin  75 mg Oral BID  . sodium chloride flush  3 mL Intravenous Q12H   Continuous Infusions:    LOS: 0 days    Time spent: 21min    Domenic Polite, MD [facility] Pager [phone]  If 7PM-7AM, please contact night-coverage [email or web address] Password [email or web address] 02/07/2016, 10:40 AM

## 2016-02-08 ENCOUNTER — Inpatient Hospital Stay (HOSPITAL_COMMUNITY): Payer: Medicare (Managed Care)

## 2016-02-08 DIAGNOSIS — J9600 Acute respiratory failure, unspecified whether with hypoxia or hypercapnia: Secondary | ICD-10-CM

## 2016-02-08 LAB — BASIC METABOLIC PANEL
Anion gap: 8 (ref 5–15)
BUN: 25 mg/dL — AB (ref 6–20)
CHLORIDE: 105 mmol/L (ref 101–111)
CO2: 23 mmol/L (ref 22–32)
CREATININE: 2.22 mg/dL — AB (ref 0.44–1.00)
Calcium: 8.6 mg/dL — ABNORMAL LOW (ref 8.9–10.3)
GFR calc Af Amer: 22 mL/min — ABNORMAL LOW (ref >60)
GFR calc non Af Amer: 19 mL/min — ABNORMAL LOW (ref >60)
Glucose, Bld: 139 mg/dL — ABNORMAL HIGH (ref 65–99)
Potassium: 4.3 mmol/L (ref 3.5–5.1)
SODIUM: 136 mmol/L (ref 135–145)

## 2016-02-08 LAB — CBC
HEMATOCRIT: 30.8 % — AB (ref 36.0–46.0)
HEMOGLOBIN: 10.1 g/dL — AB (ref 12.0–15.0)
MCH: 29.7 pg (ref 26.0–34.0)
MCHC: 32.8 g/dL (ref 30.0–36.0)
MCV: 90.6 fL (ref 78.0–100.0)
Platelets: 171 10*3/uL (ref 150–400)
RBC: 3.4 MIL/uL — AB (ref 3.87–5.11)
RDW: 14.6 % (ref 11.5–15.5)
WBC: 7 10*3/uL (ref 4.0–10.5)

## 2016-02-08 LAB — GLUCOSE, CAPILLARY
GLUCOSE-CAPILLARY: 121 mg/dL — AB (ref 65–99)
Glucose-Capillary: 145 mg/dL — ABNORMAL HIGH (ref 65–99)
Glucose-Capillary: 132 mg/dL — ABNORMAL HIGH (ref 65–99)
Glucose-Capillary: 52 mg/dL — ABNORMAL LOW (ref 65–99)
Glucose-Capillary: 94 mg/dL (ref 65–99)

## 2016-02-08 MED ORDER — HYDRALAZINE HCL 20 MG/ML IJ SOLN
5.0000 mg | Freq: Once | INTRAMUSCULAR | Status: AC
  Administered 2016-02-08: 5 mg via INTRAVENOUS
  Filled 2016-02-08: qty 1

## 2016-02-08 NOTE — Progress Notes (Addendum)
PROGRESS NOTE    [name]  [id number] DOB: Oct 05, 1928 DOA: 02/06/2016 PCP: [name], MD  Brief Narrative: This 82 year old female with CAD, Dementia, CHF, DM, CKD 4, Anemia lives at home with her daughter. She developed shortness of breath for few days. She came to ER because of her shortness of breath CXR with CHF and pneumonia  Assessment & Plan: Acute diastolic heart failure -off BiPAP -continue IV lasix 40mg  q12, incontinent and urine output inaccurate -last echo showed a EF of greater than 65% -daily weights -improving slowly  Possible Community acquired pneumonia -continue Rocephin and azithromycin -Blood Cx NGTD -repeat CXR, change Abx to pO in am if stable  Hypertension -Stable, continue home medications  Coronary artery disease -Stable, continue ASA/plavix/statin  Diabetes mellitus type 2 -improved, increased levemir, SSI  Dementia -Stable, continue Aricept  Chronic kidney disease stage IV -better than baseline which is 2.6  History of breast cancer -In remission  Full Code Communication: no family at bedside Dispo: Home in 1-2days  Subjective: Breathing better, very poor historian  Objective: Filed Vitals:   02/08/16 0552 02/08/16 0616 02/08/16 0622 02/08/16 1045  BP: 193/57  181/53 189/53  Pulse: 67  63 59  Temp: 98.5 F (36.9 C)     TempSrc: Oral     Resp: 18     Height:      Weight:  71.6 kg (157 lb 13.6 oz)    SpO2: 96%       Intake/Output Summary (Last 24 hours) at 02/08/16 1210 Last data filed at 02/08/16 1000  Gross per 24 hour  Intake    363 ml  Output   2150 ml  Net  -1787 ml   Filed Weights   02/07/16 0002 02/07/16 0225 02/08/16 0616  Weight: 72.576 kg (160 lb) 73.7 kg (162 lb 7.7 oz) 71.6 kg (157 lb 13.6 oz)    Examination:  General exam: Appears calm and comfortable, oriented to self and partly to place only Respiratory system: crackles at both bases Cardiovascular system: S1 & S2 heard, RRR. No JVD,  murmurs, rubs, gallops or clicks. No pedal edema. Gastrointestinal system: Abdomen is nondistended, soft and nontender. No organomegaly or masses felt. Normal bowel sounds heard. Central nervous system:  No focal neurological deficits. Extremities: Symmetric 5 x 5 power. Skin: No rashes, lesions or ulcers Psychiatry: Judgement and insight appear normal. Mood & affect appropriate.     Data Reviewed: I have personally reviewed following labs and imaging studies  CBC:  Recent Labs Lab 02/07/16 0010 02/07/16 0020 02/08/16 0657  WBC 8.4  --  7.0  NEUTROABS 5.2  --   --   HGB 10.1* 11.2* 10.1*  HCT 30.9* 33.0* 30.8*  MCV 91.7  --  90.6  PLT 200  --  [id number]   Basic Metabolic Panel:  Recent Labs Lab 02/07/16 0020 02/07/16 0642 02/08/16 0657  NA 142 134* 136  K 4.3 4.9 4.3  CL 105 104 105  CO2  --  19* 23  GLUCOSE 305* 353* 139*  BUN 28* 28* 25*  CREATININE 2.20* 2.29* 2.22*  CALCIUM  --  8.0* 8.6*   GFR: Estimated Creatinine Clearance: 17.6 mL/min (by C-G formula based on Cr of 2.22). Liver Function Tests: No results for input(s): AST, ALT, ALKPHOS, BILITOT, PROT, ALBUMIN in the last 168 hours. No results for input(s): LIPASE, AMYLASE in the last 168 hours. No results for input(s): AMMONIA in the last 168 hours. Coagulation Profile:  Recent Labs Lab 02/07/16 0010  INR 1.06   Cardiac Enzymes: No results for input(s): CKTOTAL, CKMB, CKMBINDEX, TROPONINI in the last 168 hours. BNP (last 3 results) No results for input(s): PROBNP in the last 8760 hours. HbA1C: No results for input(s): HGBA1C in the last 72 hours. CBG:  Recent Labs Lab 02/07/16 1240 02/07/16 1603 02/07/16 2158 02/08/16 0749 02/08/16 1119  GLUCAP 199* 162* 115* 145* 132*   Lipid Profile: No results for input(s): CHOL, HDL, LDLCALC, TRIG, CHOLHDL, LDLDIRECT in the last 72 hours. Thyroid Function Tests: No results for input(s): TSH, T4TOTAL, FREET4, T3FREE, THYROIDAB in the last 72  hours. Anemia Panel: No results for input(s): VITAMINB12, FOLATE, FERRITIN, TIBC, IRON, RETICCTPCT in the last 72 hours. Urine analysis:    Component Value Date/Time   COLORURINE YELLOW 02/07/2016 0025   APPEARANCEUR CLEAR 02/07/2016 0025   LABSPEC 1.017 02/07/2016 0025   PHURINE 5.5 02/07/2016 0025   GLUCOSEU 250* 02/07/2016 0025   GLUCOSEU >=1000 08/24/2012 1148   HGBUR NEGATIVE 02/07/2016 0025   BILIRUBINUR NEGATIVE 02/07/2016 0025   KETONESUR NEGATIVE 02/07/2016 0025   PROTEINUR 100* 02/07/2016 0025   UROBILINOGEN 0.2 05/26/2015 1654   NITRITE NEGATIVE 02/07/2016 0025   LEUKOCYTESUR NEGATIVE 02/07/2016 0025   Sepsis Labs: @LABRCNTIP (procalcitonin:4,lacticidven:4)  ) Recent Results (from the past 240 hour(s))  MRSA PCR Screening     Status: None   Collection Time: 02/07/16  2:30 AM  Result Value Ref Range Status   MRSA by PCR NEGATIVE NEGATIVE Final    Comment:        The GeneXpert MRSA Assay (FDA approved for NASAL specimens only), is one component of a comprehensive MRSA colonization surveillance program. It is not intended to diagnose MRSA infection nor to guide or monitor treatment for MRSA infections.          Radiology Studies: Dg Chest Port 1 View  02/07/2016  CLINICAL DATA:  82 year old female with shortness of for EXAM: PORTABLE CHEST 1 VIEW COMPARISON:  Chest radiograph dated 11/18/2014 FINDINGS: Single portable view of the chest demonstrates diffuse interstitial prominence and nodularity, likely congestive changes. An area of increased density at the right lung base may represent superimposed pneumonia. Clinical correlation and follow-up recommended. There is blunting of the right costophrenic angle likely related to airspace disease. Small right pleural effusion is not excluded. There is no pneumothorax. Stable cardiomegaly. No acute fracture. IMPRESSION: Cardiomegaly with congestive changes. Right lung base hazy airspace density may represent  superimposed pneumonia. Clinical correlation and follow-up recommended. Electronically Signed   By: Anner Crete M.D.   On: 02/07/2016 00:25        Scheduled Meds: . amLODipine  10 mg Oral Daily  . aspirin  81 mg Oral Daily  . atorvastatin  20 mg Oral q1800  . azithromycin  500 mg Intravenous Q24H  . cefTRIAXone (ROCEPHIN)  IV  1 g Intravenous Q24H  . citalopram  5 mg Oral Daily  . clopidogrel  75 mg Oral Daily  . enoxaparin (LOVENOX) injection  30 mg Subcutaneous Q24H  . furosemide  40 mg Intravenous BID  . insulin aspart  0-15 Units Subcutaneous TID WC  . insulin aspart  0-5 Units Subcutaneous QHS  . insulin detemir  25 Units Subcutaneous BID  . memantine  10 mg Oral BID  . pantoprazole  40 mg Oral Daily  . pregabalin  75 mg Oral Daily  . sodium chloride flush  3 mL Intravenous Q12H   Continuous Infusions:    LOS: 1 day    Time spent:  28min    Domenic Polite, MD [facility] Pager [phone]  If 7PM-7AM, please contact night-coverage [email or web address] Password [email or web address] 02/08/2016, 12:10 PM

## 2016-02-08 NOTE — Progress Notes (Signed)
MD notified that patient has a BP of 193/56.  Hydralazine order placed. Will give medication, and continue to monitor patient.

## 2016-02-08 NOTE — Evaluation (Signed)
Physical Therapy Evaluation Patient Details Name: [name] MRN: [id number] DOB: June 09, 1929 Today's Date: 02/08/2016   History of Present Illness  82 y o female with onset of SOB and hazy R lung on imaging was dx'd with PNA and CHF.  PMHx:  cardiomegaly, CAD, anemia, depression, PVD, CKD 4, PN, DM, breast CA  Clinical Impression  Pt is able to walk only for short trips and demonstrates a significant likelihood of falling.  Her gait is buckled in appearance, slow to maneuver and requires continual support on a walker for success.  Will anticipate SNF needed and acutely follow to increase LE strength and control of balance on the walker.  Her family is aware that this is being considered and are supportive of the plan.    Follow Up Recommendations SNF    Equipment Recommendations  None recommended by PT    Recommendations for Other Services Rehab consult     Precautions / Restrictions Precautions Precautions: Fall (telemetry) Restrictions Weight Bearing Restrictions: No      Mobility  Bed Mobility Overal bed mobility: Needs Assistance Bed Mobility: Supine to Sit;Sit to Supine     Supine to sit: Min assist;Mod assist Sit to supine: Min assist   General bed mobility comments: mainly helped return to bed with lifting of LE's  Transfers Overall transfer level: Needs assistance Equipment used: Rolling walker (2 wheeled);1 person hand held assist Transfers: Sit to/from [facility] Sit to Stand: Min assist;Mod assist Stand pivot transfers: Min assist       General transfer comment: reminders for hand placement and controlling descent and landing on bed high enough to manage return to supine   Ambulation/Gait Ambulation/Gait assistance: Min assist Ambulation Distance (Feet): 30 Feet Assistive device: Rolling walker (2 wheeled);1 person hand held assist Gait Pattern/deviations: Wide base of support;Decreased stride length;Step-through pattern;Step-to  pattern;Trunk flexed;Drifts right/left (slow to turn) Gait velocity: reduced Gait velocity interpretation: Below normal speed for age/gender General Gait Details: pt has a buckled appearance to legs with turns especially but with minor help could manage the walker  Stairs            Wheelchair Mobility    Modified Rankin (Stroke Patients Only)       Balance Overall balance assessment: History of Falls;Needs assistance Sitting-balance support: Feet supported Sitting balance-Leahy Scale: Fair   Postural control: Posterior lean Standing balance support: Bilateral upper extremity supported Standing balance-Leahy Scale: Poor                               Pertinent Vitals/Pain Pain Assessment: No/denies pain    Home Living Family/patient expects to be discharged to:: Private residence Living Arrangements: Children Available Help at Discharge: Family;Personal care attendant;Available PRN/intermittently Type of Home: Apartment Home Access: Level entry     Home Layout: One level        Prior Function Level of Independence: Needs assistance   Gait / Transfers Assistance Needed: family or caregiver to assist with RW or rollator  ADL's / Homemaking Assistance Needed: continual help to manage gait and ADls        Hand Dominance        Extremity/Trunk Assessment   Upper Extremity Assessment: Generalized weakness           Lower Extremity Assessment: Generalized weakness      Cervical / Trunk Assessment: Kyphotic  Communication   Communication: No difficulties  Cognition Arousal/Alertness: Awake/alert Behavior During Therapy: [facility]  for tasks assessed/performed Overall Cognitive Status: Within Functional Limits for tasks assessed                      General Comments General comments (skin integrity, edema, etc.): Pt is up to walk with PT and can manage only a short trip but after to sidestep up the bed was too fatiguing due to first  walk    Exercises        Assessment/Plan    PT Assessment Patient needs continued PT services  PT Diagnosis Difficulty walking;Generalized weakness   PT Problem List Decreased strength;Decreased range of motion;Decreased activity tolerance;Decreased balance;Decreased mobility;Decreased coordination;Decreased knowledge of use of DME;Decreased safety awareness;Cardiopulmonary status limiting activity  PT Treatment Interventions DME instruction;Gait training;Functional mobility training;Therapeutic activities;Therapeutic exercise;Balance training;Neuromuscular re-education;Patient/family education   PT Goals (Current goals can be found in the Care Plan section) Acute Rehab PT Goals Patient Stated Goal: none stated PT Goal Formulation: With patient/family Time For Goal Achievement: 02/22/16 Potential to Achieve Goals: Good    Frequency Min 2X/week   Barriers to discharge Decreased caregiver support;Inaccessible home environment home alone part of the day    Co-evaluation               End of Session Equipment Utilized During Treatment: Gait belt Activity Tolerance: Patient tolerated treatment well;Patient limited by fatigue;Patient limited by lethargy Patient left: in bed;with call bell/phone within reach;with bed alarm set Nurse Communication: Mobility status         Time: [id number] PT Time Calculation (min) (ACUTE ONLY): 29 min   Charges:   PT Evaluation $PT Eval Moderate Complexity: 1 Procedure PT Treatments $Gait Training: 8-22 mins   PT G CodesRamond Dial 2016-03-03, 5:15 PM    Mee Hives, PT MS Acute Rehab Dept. Number: [place] and [place]

## 2016-02-08 NOTE — Progress Notes (Signed)
Inpatient Diabetes Program Recommendations  AACE/ADA: New Consensus Statement on Inpatient Glycemic Control (2015)  Target Ranges:  Prepandial:   less than 140 mg/dL      Peak postprandial:   less than 180 mg/dL (1-2 hours)      Critically ill patients:  140 - 180 mg/dL  Results for MAGELINE, [facility] (MRN [id number]) as of 02/08/2016 10:45  Ref. Range 07/19/2015 05:10  Hemoglobin A1C Latest Ref Range: 4.8-5.6 % 18.0 (H)     Review of Glycemic Control  Diabetes history: DM2 Outpatient Diabetes medications: Levemir 20 units bid Current orders for Inpatient glycemic control: Levemir 25 units bid + Novolog correction 0-15 units tid + 0-5 units hs  Inpatient Diabetes Program Recommendations:  Noted A1c from 07/19/15 18.0. Please consider A1c to determine if glycemic control has improved. Will follow.  Thank you, Nani Gasser. Caelie Remsburg, RN, MSN, CDE Inpatient Glycemic Control Team Team Pager [phone] (8am-5pm) 02/08/2016 10:49 AM

## 2016-02-08 NOTE — Progress Notes (Signed)
Hypoglycemic Event  CBG: 52  Treatment: 15 GM carbohydrate snack  Symptoms: None  Follow-up CBG: Time:1730 CBG Result:121  Possible Reasons for Event: Unknown  Comments/MD notified:    [name] [name]

## 2016-02-08 NOTE — Progress Notes (Signed)
CSW received call from pt PACE worker- Earnest Bailey [phone]- Earnest Bailey reports that pt is normally fairly independent at home with rollator and attends [employer] 4x/wk while dtr is working  [employer] CSW reports that pt is needing SNF they would be able to authorize- PT has been ordered -awaiting recommendations  CSW will follow for PT recommendations  Domenica Reamer, [place] Social Worker [phone]

## 2016-02-09 LAB — BASIC METABOLIC PANEL
Anion gap: 9 (ref 5–15)
BUN: 27 mg/dL — AB (ref 6–20)
CHLORIDE: 103 mmol/L (ref 101–111)
CO2: 24 mmol/L (ref 22–32)
CREATININE: 2.36 mg/dL — AB (ref 0.44–1.00)
Calcium: 8.8 mg/dL — ABNORMAL LOW (ref 8.9–10.3)
GFR calc non Af Amer: 18 mL/min — ABNORMAL LOW (ref >60)
GFR, EST AFRICAN AMERICAN: 20 mL/min — AB (ref >60)
GLUCOSE: 94 mg/dL (ref 65–99)
Potassium: 4 mmol/L (ref 3.5–5.1)
Sodium: 136 mmol/L (ref 135–145)

## 2016-02-09 LAB — CBC
HEMATOCRIT: 31.5 % — AB (ref 36.0–46.0)
HEMOGLOBIN: 10.5 g/dL — AB (ref 12.0–15.0)
MCH: 29.7 pg (ref 26.0–34.0)
MCHC: 33.3 g/dL (ref 30.0–36.0)
MCV: 89.2 fL (ref 78.0–100.0)
Platelets: 186 10*3/uL (ref 150–400)
RBC: 3.53 MIL/uL — ABNORMAL LOW (ref 3.87–5.11)
RDW: 14.4 % (ref 11.5–15.5)
WBC: 6.4 10*3/uL (ref 4.0–10.5)

## 2016-02-09 LAB — GLUCOSE, CAPILLARY
GLUCOSE-CAPILLARY: 222 mg/dL — AB (ref 65–99)
GLUCOSE-CAPILLARY: 117 mg/dL — AB (ref 65–99)
Glucose-Capillary: 124 mg/dL — ABNORMAL HIGH (ref 65–99)
Glucose-Capillary: 331 mg/dL — ABNORMAL HIGH (ref 65–99)

## 2016-02-09 LAB — HEMOGLOBIN A1C
Hgb A1c MFr Bld: 8.1 % — ABNORMAL HIGH (ref 4.8–5.6)
Mean Plasma Glucose: 186 mg/dL

## 2016-02-09 MED ORDER — PREGABALIN 25 MG PO CAPS
50.0000 mg | ORAL_CAPSULE | Freq: Every day | ORAL | Status: DC
  Administered 2016-02-10 – 2016-02-11 (×2): 50 mg via ORAL
  Filled 2016-02-09 (×2): qty 2

## 2016-02-09 MED ORDER — HYDRALAZINE HCL 20 MG/ML IJ SOLN
5.0000 mg | Freq: Once | INTRAMUSCULAR | Status: AC
  Administered 2016-02-09: 5 mg via INTRAVENOUS
  Filled 2016-02-09: qty 1

## 2016-02-09 MED ORDER — CEFUROXIME AXETIL 250 MG PO TABS
250.0000 mg | ORAL_TABLET | Freq: Two times a day (BID) | ORAL | Status: DC
  Administered 2016-02-09 – 2016-02-11 (×4): 250 mg via ORAL
  Filled 2016-02-09 (×5): qty 1

## 2016-02-09 NOTE — Progress Notes (Signed)
PROGRESS NOTE    [name]  [id number] DOB: October 28, 1928 DOA: 02/06/2016 PCP: [name], MD  Brief Narrative: This 82 year old female with CAD, Dementia, CHF, DM, CKD 4, Anemia lives at home with her daughter. She developed shortness of breath for few days. She came to ER because of her shortness of breath CXR with CHF and pneumonia. Improving slowly with lasix and Abx Pt recommended SNF  Assessment & Plan: Acute diastolic heart failure -off BiPAP -on IV lasix 40mg  q12, incontinent and urine output inaccurate, but negative 2.3L -still with crackles and elevated JVD will continue IV lasix one more day -last echo showed a EF of greater than 65% -daily weights -improving slowly  Possible Community acquired pneumonia -s/p 2days of Rocephin and azithromycin -Blood Cx NGTD -change to PO Cefuroxime for 22more days  Hypertension -Stable, continue home medications  Coronary artery disease -Stable, continue ASA/plavix/statin  Diabetes mellitus type 2 -improved, increased levemir, SSI  Dementia -Stable, continue Aricept  Chronic kidney disease stage IV -better than baseline which is 2.6 -still stable, monitor  History of breast cancer -In remission  Full Code Communication: called and d/w daughter Ms.Alston Dispo: SNF in 1-2days  Subjective: Breathing better, very poor historian  Objective: Filed Vitals:   02/08/16 1305 02/08/16 2144 02/09/16 0517 02/09/16 0804  BP: 158/59 175/50 189/58 147/41  Pulse: 62 64 60 61  Temp: 99.1 F (37.3 C) 98.4 F (36.9 C) 98.2 F (36.8 C)   TempSrc: Oral Oral Oral   Resp: 20 18 16    Height:      Weight:   71.8 kg (158 lb 4.6 oz)   SpO2: 97% 93% 100% 98%    Intake/Output Summary (Last 24 hours) at 02/09/16 1110 Last data filed at 02/09/16 0819  Gross per 24 hour  Intake    720 ml  Output    800 ml  Net    -80 ml   Filed Weights   02/07/16 0225 02/08/16 0616 02/09/16 0517  Weight: 73.7 kg (162 lb 7.7 oz) 71.6 kg  (157 lb 13.6 oz) 71.8 kg (158 lb 4.6 oz)    Examination:  General exam: Appears calm and comfortable, oriented to self and partly to place only Respiratory system: crackles at both bases Cardiovascular system: S1 & S2 heard, RRR. No JVD, murmurs, rubs, gallops or clicks. No pedal edema. Gastrointestinal system: Abdomen is nondistended, soft and nontender. No organomegaly or masses felt. Normal bowel sounds heard. Central nervous system:  No focal neurological deficits. Extremities: Symmetric 5 x 5 power. Skin: No rashes, lesions or ulcers Psychiatry: Judgement and insight appear normal. Mood & affect appropriate.     Data Reviewed: I have personally reviewed following labs and imaging studies  CBC:  Recent Labs Lab 02/07/16 0010 02/07/16 0020 02/08/16 0657 02/09/16 0750  WBC 8.4  --  7.0 6.4  NEUTROABS 5.2  --   --   --   HGB 10.1* 11.2* 10.1* 10.5*  HCT 30.9* 33.0* 30.8* 31.5*  MCV 91.7  --  90.6 89.2  PLT 200  --  171 [id number]   Basic Metabolic Panel:  Recent Labs Lab 02/07/16 0020 02/07/16 0642 02/08/16 0657 02/09/16 0749  NA 142 134* 136 136  K 4.3 4.9 4.3 4.0  CL 105 104 105 103  CO2  --  19* 23 24  GLUCOSE 305* 353* 139* 94  BUN 28* 28* 25* 27*  CREATININE 2.20* 2.29* 2.22* 2.36*  CALCIUM  --  8.0* 8.6* 8.8*   GFR: Estimated Creatinine  Clearance: 16.6 mL/min (by C-G formula based on Cr of 2.36). Liver Function Tests: No results for input(s): AST, ALT, ALKPHOS, BILITOT, PROT, ALBUMIN in the last 168 hours. No results for input(s): LIPASE, AMYLASE in the last 168 hours. No results for input(s): AMMONIA in the last 168 hours. Coagulation Profile:  Recent Labs Lab 02/07/16 0010  INR 1.06   Cardiac Enzymes: No results for input(s): CKTOTAL, CKMB, CKMBINDEX, TROPONINI in the last 168 hours. BNP (last 3 results) No results for input(s): PROBNP in the last 8760 hours. HbA1C:  Recent Labs  02/08/16 0657  HGBA1C 8.1*   CBG:  Recent Labs Lab  02/08/16 1119 02/08/16 1654 02/08/16 1735 02/08/16 2144 02/09/16 0808  GLUCAP 132* 52* 121* 94 124*   Lipid Profile: No results for input(s): CHOL, HDL, LDLCALC, TRIG, CHOLHDL, LDLDIRECT in the last 72 hours. Thyroid Function Tests: No results for input(s): TSH, T4TOTAL, FREET4, T3FREE, THYROIDAB in the last 72 hours. Anemia Panel: No results for input(s): VITAMINB12, FOLATE, FERRITIN, TIBC, IRON, RETICCTPCT in the last 72 hours. Urine analysis:    Component Value Date/Time   COLORURINE YELLOW 02/07/2016 0025   APPEARANCEUR CLEAR 02/07/2016 0025   LABSPEC 1.017 02/07/2016 0025   PHURINE 5.5 02/07/2016 0025   GLUCOSEU 250* 02/07/2016 0025   GLUCOSEU >=1000 08/24/2012 1148   HGBUR NEGATIVE 02/07/2016 0025   BILIRUBINUR NEGATIVE 02/07/2016 0025   KETONESUR NEGATIVE 02/07/2016 0025   PROTEINUR 100* 02/07/2016 0025   UROBILINOGEN 0.2 05/26/2015 1654   NITRITE NEGATIVE 02/07/2016 0025   LEUKOCYTESUR NEGATIVE 02/07/2016 0025   Sepsis Labs: @LABRCNTIP (procalcitonin:4,lacticidven:4)  ) Recent Results (from the past 240 hour(s))  Culture, blood (Routine X 2) w Reflex to ID Panel     Status: None (Preliminary result)   Collection Time: 02/07/16  1:00 AM  Result Value Ref Range Status   Specimen Description BLOOD RIGHT HAND  Final   Special Requests IN PEDIATRIC BOTTLE 4CC  Final   Culture NO GROWTH 1 DAY  Final   Report Status PENDING  Incomplete  Culture, blood (Routine X 2) w Reflex to ID Panel     Status: None (Preliminary result)   Collection Time: 02/07/16  1:15 AM  Result Value Ref Range Status   Specimen Description BLOOD RIGHT WRIST  Final   Special Requests IN PEDIATRIC BOTTLE 1CC  Final   Culture NO GROWTH 1 DAY  Final   Report Status PENDING  Incomplete  MRSA PCR Screening     Status: None   Collection Time: 02/07/16  2:30 AM  Result Value Ref Range Status   MRSA by PCR NEGATIVE NEGATIVE Final    Comment:        The GeneXpert MRSA Assay (FDA approved for NASAL  specimens only), is one component of a comprehensive MRSA colonization surveillance program. It is not intended to diagnose MRSA infection nor to guide or monitor treatment for MRSA infections.          Radiology Studies: Dg Chest 2 View  02/08/2016  CLINICAL DATA:  Shortness of breath. EXAM: CHEST  2 VIEW COMPARISON:  Radiographs of February 07, 2016. FINDINGS: Stable cardiomegaly. No pneumothorax is noted. Atherosclerosis of thoracic aorta is noted. Minimal bilateral pleural effusions are noted. Minimal bilateral perihilar edema may be present. Bony thorax is unremarkable. IMPRESSION: Aortic atherosclerosis. Minimal bilateral perihilar edema. Minimal bilateral pleural effusions. Electronically Signed   By: Marijo Conception, M.D.   On: 02/08/2016 14:48        Scheduled Meds: . amLODipine  10 mg Oral Daily  . aspirin  81 mg Oral Daily  . atorvastatin  20 mg Oral q1800  . azithromycin  500 mg Intravenous Q24H  . cefTRIAXone (ROCEPHIN)  IV  1 g Intravenous Q24H  . citalopram  5 mg Oral Daily  . clopidogrel  75 mg Oral Daily  . enoxaparin (LOVENOX) injection  30 mg Subcutaneous Q24H  . furosemide  40 mg Intravenous BID  . insulin aspart  0-15 Units Subcutaneous TID WC  . insulin aspart  0-5 Units Subcutaneous QHS  . insulin detemir  25 Units Subcutaneous BID  . memantine  10 mg Oral BID  . pantoprazole  40 mg Oral Daily  . [START ON 02/10/2016] pregabalin  50 mg Oral Daily  . sodium chloride flush  3 mL Intravenous Q12H   Continuous Infusions:    LOS: 2 days    Time spent: 42min    Domenic Polite, MD [facility] Pager [phone]  If 7PM-7AM, please contact night-coverage [email or web address] Password [email or web address] 02/09/2016, 11:10 AM

## 2016-02-09 NOTE — Progress Notes (Signed)
Occupational Therapy Evaluation Patient Details Name: [name] MRN: [id number] DOB: [birth year] Today's Date: 02/09/2016    History of Present Illness 82 y o female with onset of SOB and hazy R lung on imaging was dx'd with PNA and CHF.  PMHx:  cardiomegaly, CAD, anemia, depression, PVD, CKD 4, PN, DM, breast CA   Clinical Impression   Pt is a current [facility] participant and reportedly requires some assistance with all ADLs and basic transfers according to PT evaluation (No family present for OT evaluation). Pt currently requires max assist for LB ADLs and mod assist for basic transfers. Pt will benefit from continued acute OT to increase independence and safety with ADLs and mobility to allow for safe discharge home. See below for discharge and follow up recommendations. Will continue to follow acutely.    Follow Up Recommendations  Other (comment) - Unsure if pt is at her physical baseline since no family present during evaluation. If [facility] staff feel pt is capable of attending day center with increased physical needs and daughter can provide 24/7 assistance when pt is home from center, then I feel pt would be safe to discharge home with [facility] PT/OT/aide.   Equipment Recommendations  Other (comment) (TBD once family member is present to confirm current DME)    Recommendations for Other Services       Precautions / Restrictions Precautions Precautions: Fall Restrictions Weight Bearing Restrictions: No      Mobility Bed Mobility Overal bed mobility: Needs Assistance Bed Mobility: Supine to Sit;Rolling Rolling: Min guard   Supine to sit: Min assist     General bed mobility comments: Assist for trunk support to come to sitting position. VCs for hand placement and safety.  Transfers Overall transfer level: Needs assistance Equipment used: Rolling walker (2 wheeled) Transfers: Sit to/from Stand Sit to Stand: Mod assist         General transfer comment: Heavy mod assist for  boost to stand and to stabilize balance. VCs for safe hand placement and safe RW use.     Balance Overall balance assessment: Needs assistance Sitting-balance support: No upper extremity supported;Feet supported Sitting balance-Leahy Scale: Fair     Standing balance support: Bilateral upper extremity supported;During functional activity Standing balance-Leahy Scale: Poor Standing balance comment: reliant on UE support for balance                            ADL Overall ADL's : Needs assistance/impaired         Upper Body Bathing: Set up;Sitting   Lower Body Bathing: Maximal assistance;Sit to/from stand Lower Body Bathing Details (indicate cue type and reason): assist for sit-stand and for thorough bathing Upper Body Dressing : Set up;Sitting   Lower Body Dressing: Maximal assistance;Sit to/from stand Lower Body Dressing Details (indicate cue type and reason): assist for sit-stand and to start clothing over feet Toilet Transfer: Moderate assistance;Ambulation;BSC;RW;Cueing for safety Toilet Transfer Details (indicate cue type and reason): assist for sit-stand and VCs for hand placement Toileting- Clothing Manipulation and Hygiene: Maximal assistance;Sit to/from stand Toileting - Clothing Manipulation Details (indicate cue type and reason): assist for sit-stand and thorough pericare     Functional mobility during ADLs: Moderate assistance;Rolling walker       Vision Vision Assessment?: No apparent visual deficits   Perception     Praxis      Pertinent Vitals/Pain Pain Assessment: No/denies pain     Hand Dominance Right   Extremity/Trunk Assessment  Upper Extremity Assessment Upper Extremity Assessment: Generalized weakness   Lower Extremity Assessment Lower Extremity Assessment: Generalized weakness   Cervical / Trunk Assessment Cervical / Trunk Assessment: Kyphotic   Communication Communication Communication: No difficulties   Cognition  Arousal/Alertness: Awake/alert Behavior During Therapy: WFL for tasks assessed/performed Overall Cognitive Status: History of cognitive impairments - at baseline       Memory: Decreased short-term memory             General Comments       Exercises       Shoulder Instructions      Home Living Family/patient expects to be discharged to:: Private residence Living Arrangements: Children (daughter) Available Help at Discharge: Family;Personal care attendant;Available PRN/intermittently Type of Home: Apartment Home Access: Level entry     Home Layout: One level     Bathroom Shower/Tub: Occupational psychologist: Standard     Home Equipment: Environmental consultant - 2 wheels;Cane - single point;Bedside commode   Additional Comments: Some information obtained from PT evaluation      Prior Functioning/Environment Level of Independence: Needs assistance  Gait / Transfers Assistance Needed: Pt reports using RW ADL's / Homemaking Assistance Needed: Pt reports she bathes and dresses herself. Unsure of accuracy of pt's statements due to h/c of cognitive deficits.   Comments: Pt reports attending [facility] day [facility] 5 days/week while daughter is at work. Unsure of accuracy of her responses pt is a poor historian and no family present during OT eval.    OT Diagnosis: Generalized weakness;Cognitive deficits   OT Problem List: Decreased activity tolerance;Impaired balance (sitting and/or standing);Decreased strength;Decreased cognition;Decreased safety awareness;Decreased knowledge of use of DME or AE;Obesity   OT Treatment/Interventions: Self-care/ADL training;Therapeutic exercise;Energy conservation;DME and/or AE instruction;Therapeutic activities;Cognitive remediation/compensation;Patient/family education;Balance training    OT Goals(Current goals can be found in the care plan section) Acute Rehab OT Goals Patient Stated Goal: to have a warm blanket OT Goal Formulation: With  patient Time For Goal Achievement: 02/23/16 Potential to Achieve Goals: Good ADL Goals Pt Will Perform Grooming: with set-up;sitting Pt Will Perform Upper Body Bathing: with set-up;sitting Pt Will Perform Lower Body Bathing: with set-up;with adaptive equipment;sit to/from stand Pt Will Transfer to Toilet: with min guard assist;ambulating;bedside commode (over toilet) Pt Will Perform Toileting - Clothing Manipulation and hygiene: with min guard assist;sit to/from stand  OT Frequency: Min 2X/week   Barriers to D/C:            Co-evaluation              End of Session Equipment Utilized During Treatment: Gait belt;Rolling walker Nurse Communication: Mobility status  Activity Tolerance: Patient tolerated treatment well Patient left: in chair;with call bell/phone within reach;with chair alarm set   Time: 1022-1040 OT Time Calculation (min): 18 min Charges:  OT General Charges $OT Visit: 1 Procedure OT Evaluation $OT Eval Moderate Complexity: 1 Procedure G-Codes:    Redmond Baseman, OTR/L [phone] 02/09/2016, 11:00 AM

## 2016-02-09 NOTE — NC FL2 (Signed)
[place] MEDICAID FL2 LEVEL OF CARE SCREENING TOOL     IDENTIFICATION  Patient Name: [name] Birthdate: 12/27/1928 Sex: female Admission Date (Current Location): 02/06/2016  [facility] and [place] Number:  Herbalist and Address:  [facility], [place] [address], [place], [place] [address]      Provider Number: [id number]  Attending Physician Name and Address:  Domenic Polite, MD  Relative Name and Phone Number:  Ubaldo Glassing, daughter, [phone]    Current Level of Care: Hospital Recommended Level of Care: [place] Prior Approval Number:    Date Approved/Denied:   PASRR Number: [id number]  Discharge Plan: SNF    Current Diagnoses: Patient Active Problem List   Diagnosis Date Noted  . CHF (congestive heart failure) ([place]) 02/07/2016  . Acute respiratory failure ([place]) 02/07/2016  . Uncontrolled type 2 diabetes mellitus with stage 4 chronic kidney disease, with long-term current use of insulin ([place])   . Hyperkalemia 07/18/2015  . Lethargy 06/04/2015  . Diabetic nephropathies ([place]) 05/27/2015  . Acute renal failure superimposed on stage 3 chronic kidney disease ([place]) 05/27/2015  . AKI (acute kidney injury) ([place])   . Hyperglycemia 05/26/2015  . Uncontrolled diabetes mellitus with hyperosmolarity, with long-term current use of insulin ([place]) 05/26/2015  . Non-compliant behavior 04/01/2015  . Syncope 11/18/2014  . Skin change   . Breast cancer ([place])   . Edema   . Hyperosmolar (nonketotic) coma ([place]) 04/01/2012  . Fracture of left toe 04/01/2012  . CKD (chronic kidney disease) stage 4, GFR 15-29 ml/min ([facility]) 04/01/2012  . Unstable angina ([place]) 11/21/2011  . Low back pain   . SYNCOPE 09/07/2009  . Depression 06/24/2009  . Dementia 06/24/2009  . INSOMNIA, CHRONIC 03/25/2009  . Hyperlipidemia 03/10/2009  . ANEMIA 03/10/2009  . Coronary atherosclerosis 03/10/2009  . PVD 03/10/2009  . Uncontrolled type 2 diabetes with renal  manifestation ([place]) 03/09/2009  . Essential hypertension 03/09/2009    Orientation RESPIRATION BLADDER Height & Weight     Self, Place  Normal Continent Weight: 71.8 kg (158 lb 4.6 oz) Height:  5\' 3"  (160 cm)  BEHAVIORAL SYMPTOMS/MOOD NEUROLOGICAL BOWEL NUTRITION STATUS      Continent Diet (Please see DC Summary)  AMBULATORY STATUS COMMUNICATION OF NEEDS Skin   Limited Assist Verbally Normal                       Personal Care Assistance Level of Assistance  Bathing, Feeding, Dressing Bathing Assistance: Limited assistance Feeding assistance: Limited assistance Dressing Assistance: Limited assistance     Functional Limitations Info             SPECIAL CARE FACTORS FREQUENCY  PT (By licensed PT), OT (By licensed OT)     PT Frequency: min 2x/week OT Frequency: min 2x/week            Contractures      Additional Factors Info  Code Status, Allergies, Insulin Sliding Scale Code Status Info: Full Allergies Info: Ace Inhibitors   Insulin Sliding Scale Info: insulin aspart (novoLOG) injection 0-15 Units;insulin aspart (novoLOG) injection 0-5 Units;       Current Medications (02/09/2016):  This is the current hospital active medication list Current Facility-Administered Medications  Medication Dose Route Frequency Provider Last Rate Last Dose  . acetaminophen (TYLENOL) tablet 650 mg  650 mg Oral Q6H PRN Debby Crosley, MD       Or  . acetaminophen (TYLENOL) suppository 650 mg  650 mg Rectal  Q6H PRN Quintella Baton, MD      . amLODipine (NORVASC) tablet 10 mg  10 mg Oral Daily Domenic Polite, MD   10 mg at 02/09/16 0806  . aspirin chewable tablet 81 mg  81 mg Oral Daily Debby Crosley, MD   81 mg at 02/09/16 0807  . atorvastatin (LIPITOR) tablet 20 mg  20 mg Oral q1800 Debby Crosley, MD   20 mg at 02/08/16 1720  . cefUROXime (CEFTIN) tablet 250 mg  250 mg Oral BID WC Domenic Polite, MD      . citalopram (CELEXA) tablet 5 mg  5 mg Oral Daily Domenic Polite, MD   5 mg  at 02/09/16 [id number]  . clopidogrel (PLAVIX) tablet 75 mg  75 mg Oral Daily Debby Crosley, MD   75 mg at 02/09/16 0807  . enoxaparin (LOVENOX) injection 30 mg  30 mg Subcutaneous Q24H Debby Crosley, MD   30 mg at 02/09/16 0808  . furosemide (LASIX) injection 40 mg  40 mg Intravenous BID Quintella Baton, MD   40 mg at 02/09/16 0807  . insulin aspart (novoLOG) injection 0-15 Units  0-15 Units Subcutaneous TID WC Quintella Baton, MD   2 Units at 02/09/16 0814  . insulin aspart (novoLOG) injection 0-5 Units  0-5 Units Subcutaneous QHS Debby Crosley, MD   0 Units at 02/07/16 2200  . insulin detemir (LEVEMIR) injection 25 Units  25 Units Subcutaneous BID Domenic Polite, MD   25 Units at 02/09/16 1039  . ipratropium-albuterol (DUONEB) 0.5-2.5 (3) MG/3ML nebulizer solution 3 mL  3 mL Nebulization Q4H PRN Debby Crosley, MD      . memantine (NAMENDA) tablet 10 mg  10 mg Oral BID Domenic Polite, MD   10 mg at 02/09/16 0806  . ondansetron (ZOFRAN) tablet 4 mg  4 mg Oral Q6H PRN Debby Crosley, MD       Or  . ondansetron (ZOFRAN) injection 4 mg  4 mg Intravenous Q6H PRN Debby Crosley, MD      . pantoprazole (PROTONIX) EC tablet 40 mg  40 mg Oral Daily Debby Crosley, MD   40 mg at 02/09/16 0806  . [START ON 02/10/2016] pregabalin (LYRICA) capsule 50 mg  50 mg Oral Daily Domenic Polite, MD      . senna-docusate (Senokot-S) tablet 1 tablet  1 tablet Oral QHS PRN Debby Crosley, MD      . sodium chloride flush (NS) 0.9 % injection 3 mL  3 mL Intravenous Q12H Quintella Baton, MD   Stopped at 02/09/16 1000     Discharge Medications: Please see discharge summary for a list of discharge medications.  Relevant Imaging Results:  Relevant Lab Results:   Additional Information SSN: [place], [place]

## 2016-02-09 NOTE — Clinical Social Work Note (Signed)
Clinical Social Work Assessment  Patient Details  Name: [name] MRN: [id number] Date of Birth: Jul 07, 1929  Date of referral:  02/09/16               Reason for consult:  Facility Placement                Permission sought to share information with:  Facility Sport and exercise psychologist, Family Supports Permission granted to share information::  No (Patient is disoriented; completed assessment with daughter)  Name::     Product manager::  [employer]  Relationship::  Daughter  Contact Information:  [phone]  Housing/Transportation Living arrangements for the past 2 months:  [place] of Information:  Adult Children Patient Interpreter Needed:  None Criminal Activity/Legal Involvement Pertinent to Current Situation/Hospitalization:  No - Comment as needed Significant Relationships:  Adult Children Lives with:  Self, Adult Children Do you feel safe going back to the place where you live?  No Need for family participation in patient care:  Yes (Comment)  Care giving concerns:  CSW received referral for possible SNF placement at time of discharge. Patient is disoriented. CSW spoke with patient's daughter at bedside regarding PT recommendation of SNF placement at time of discharge. Per patient's daughter, patient's daughter is currently unable to care for patient at their home given patient's current physical needs and fall risk. Patient's daughter expressed understanding of PT recommendation and are agreeable to SNF placement at time of discharge. CSW to continue to follow and assist with discharge planning needs.   Social Worker assessment / plan:  CSW spoke with patient's daughter concerning possibility of rehab at [facility] before returning home.  Employment status:  Retired Forensic scientist:  Managed Care PT Recommendations:  [place] / Referral to community resources:  [place]  Patient/Family's Response to care:  Patient's  daughter wants patient to go to [place] per [place]. [facility] can provide transport if during business hours.   Patient/Family's Understanding of and Emotional Response to Diagnosis, Current Treatment, and Prognosis:  Patient is realistic regarding therapy needs. No questions/concerns about plan or treatment.    Emotional Assessment Appearance:  Appears stated age Attitude/Demeanor/Rapport:  Unable to Assess Affect (typically observed):  Unable to Assess Orientation:  Oriented to Self, Oriented to Place Alcohol / Substance use:  Not Applicable Psych involvement (Current and /or in the community):  No (Comment)  Discharge Needs  Concerns to be addressed:  Care Coordination Readmission within the last 30 days:  No Current discharge risk:  None Barriers to Discharge:  Continued Medical Work up   [employer], [place] 02/09/2016, 11:34 AM

## 2016-02-09 NOTE — Care Management Important Message (Signed)
Important Message  Patient Details  Name: [name] MRN: [id number] Date of Birth: 1928/09/15   Medicare Important Message Given:  Yes    Hashem Goynes [name] 02/09/2016, 2:03 PM

## 2016-02-09 NOTE — Progress Notes (Signed)
Inpatient Diabetes Program Recommendations  AACE/ADA: New Consensus Statement on Inpatient Glycemic Control (2015)  Target Ranges:  Prepandial:   less than 140 mg/dL      Peak postprandial:   less than 180 mg/dL (1-2 hours)      Critically ill patients:  140 - 180 mg/dL   Lab Results  Component Value Date   GLUCAP 124* 02/09/2016   HGBA1C 8.1* 02/08/2016    Review of Glycemic Control  Diabetes history: DM2 Outpatient Diabetes medications: Levemir 20 units bid Current orders for Inpatient glycemic control: Levemir 25 units bid + Novolog correction 0-15 units tid + 0-5 units hs  Inpatient Diabetes Program Recommendations:  Noted A1c decreased to 8.1 and patient eating poorly over the past 24 hrs. Please consider decrease in Levemir to 20 units bid and decrease Novolog correction to 0-9 units tid with current hs correction.

## 2016-02-10 DIAGNOSIS — G3183 Dementia with Lewy bodies: Secondary | ICD-10-CM

## 2016-02-10 DIAGNOSIS — J181 Lobar pneumonia, unspecified organism: Secondary | ICD-10-CM

## 2016-02-10 DIAGNOSIS — I10 Essential (primary) hypertension: Secondary | ICD-10-CM

## 2016-02-10 DIAGNOSIS — I5031 Acute diastolic (congestive) heart failure: Secondary | ICD-10-CM

## 2016-02-10 DIAGNOSIS — F0280 Dementia in other diseases classified elsewhere without behavioral disturbance: Secondary | ICD-10-CM

## 2016-02-10 LAB — BASIC METABOLIC PANEL
ANION GAP: 10 (ref 5–15)
BUN: 41 mg/dL — ABNORMAL HIGH (ref 6–20)
CO2: 24 mmol/L (ref 22–32)
Calcium: 8.3 mg/dL — ABNORMAL LOW (ref 8.9–10.3)
Chloride: 101 mmol/L (ref 101–111)
Creatinine, Ser: 3.23 mg/dL — ABNORMAL HIGH (ref 0.44–1.00)
GFR, EST AFRICAN AMERICAN: 14 mL/min — AB (ref >60)
GFR, EST NON AFRICAN AMERICAN: 12 mL/min — AB (ref >60)
GLUCOSE: 113 mg/dL — AB (ref 65–99)
POTASSIUM: 4.3 mmol/L (ref 3.5–5.1)
SODIUM: 135 mmol/L (ref 135–145)

## 2016-02-10 LAB — GLUCOSE, CAPILLARY
GLUCOSE-CAPILLARY: 80 mg/dL (ref 65–99)
GLUCOSE-CAPILLARY: 74 mg/dL (ref 65–99)
GLUCOSE-CAPILLARY: 174 mg/dL — AB (ref 65–99)
Glucose-Capillary: 159 mg/dL — ABNORMAL HIGH (ref 65–99)

## 2016-02-10 LAB — CBC
HEMATOCRIT: 29.6 % — AB (ref 36.0–46.0)
HEMOGLOBIN: 9.9 g/dL — AB (ref 12.0–15.0)
MCH: 30 pg (ref 26.0–34.0)
MCHC: 33.4 g/dL (ref 30.0–36.0)
MCV: 89.7 fL (ref 78.0–100.0)
Platelets: 186 10*3/uL (ref 150–400)
RBC: 3.3 MIL/uL — AB (ref 3.87–5.11)
RDW: 14.5 % (ref 11.5–15.5)
WBC: 6 10*3/uL (ref 4.0–10.5)

## 2016-02-10 NOTE — Progress Notes (Signed)
Physical Therapy Treatment Patient Details Name: [name] MRN: [id number] DOB: 1928/08/29 Today's Date: 02/10/2016    History of Present Illness 82 y o female with onset of SOB and hazy R lung on imaging was dx'd with PNA and CHF.  PMHx:  cardiomegaly, CAD, anemia, depression, PVD, CKD 4, PN, DM, breast CA    PT Comments    Patient progressing slowly towards PT goals. Improved ambulation distance with encouragement but requires Min A for stability/safety. Pt fatigues during mobility requiring seated rest breaks. Able to stand for pericare with UE support and still requires assist for transfers. Will continue to follow.    Follow Up Recommendations  SNF     Equipment Recommendations  None recommended by PT    Recommendations for Other Services       Precautions / Restrictions Precautions Precautions: Fall Restrictions Weight Bearing Restrictions: No    Mobility  Bed Mobility Overal bed mobility: Needs Assistance Bed Mobility: Supine to Sit     Supine to sit: Min assist;HOB elevated     General bed mobility comments: Assist for trunk support to come to sitting position. VCs for hand placement and safety. Increased time. Use of rail.  Transfers Overall transfer level: Needs assistance Equipment used: Rolling walker (2 wheeled) Transfers: Sit to/from Stand Sit to Stand: Mod assist Stand pivot transfers: Min assist       General transfer comment: Assist to power to standing with cues for hand placement as pt pulling up on RW. Stood from [employer], from chair x3. from [facility] x1. SPT chair to/from [facility] Min A for balance.   Ambulation/Gait Ambulation/Gait assistance: Min assist Ambulation Distance (Feet): 30 Feet (x2 bouts) Assistive device: Rolling walker (2 wheeled) Gait Pattern/deviations: Step-through pattern;Decreased stride length;Shuffle;Trunk flexed Gait velocity: decreased Gait velocity interpretation: Below normal speed for age/gender General Gait Details:  Slow. shuffling gait with difficulty navigating RW at times. Fatigues. 1 seated rest break.    Stairs            Wheelchair Mobility    Modified Rankin (Stroke Patients Only)       Balance Overall balance assessment: Needs assistance Sitting-balance support: Feet supported;No upper extremity supported Sitting balance-Leahy Scale: Fair     Standing balance support: During functional activity Standing balance-Leahy Scale: Poor Standing balance comment: Reliant on BUEs for support in standing. Pt able to stand with UEs on sink for pericare.                    Cognition Arousal/Alertness: Awake/alert Behavior During Therapy: WFL for tasks assessed/performed Overall Cognitive Status: History of cognitive impairments - at baseline       Memory: Decreased short-term memory              Exercises      General Comments        Pertinent Vitals/Pain Pain Assessment: No/denies pain    Home Living                      Prior Function            PT Goals (current goals can now be found in the care plan section) Progress towards PT goals: Progressing toward goals    Frequency  Min 2X/week    PT Plan Current plan remains appropriate    Co-evaluation             End of Session Equipment Utilized During Treatment: Gait belt Activity Tolerance: Patient tolerated  treatment well;Patient limited by fatigue Patient left: in chair;with call bell/phone within reach;with chair alarm set     Time: 1020-1043 PT Time Calculation (min) (ACUTE ONLY): 23 min  Charges:  $Gait Training: 8-22 mins $Therapeutic Activity: 8-22 mins                    G Codes:      Hobart Marte A Niti Leisure 02/10/2016, 12:02 PM Wray Kearns, [place], DPT [phone]

## 2016-02-10 NOTE — Care Management Note (Signed)
Case Management Note  Patient Details  Name: [name] MRN: [id number] Date of Birth: 1928/12/30  Subjective/Objective:                 Spoke with patient at the bedside. She states that her plan at DC is to go a nursing facility. Patient is followed by [facility]. From home with daughter.  Admitted with CHF/ PNA   Action/Plan:  Will DC to SNF as facilitated by CSW when medically clear.   Expected Discharge Date:                  Expected Discharge Plan:  Skilled Nursing Facility  In-House Referral:  Clinical Social Work  Discharge planning Services  CM Consult  Post Acute Care Choice:    Choice offered to:  NA  DME Arranged:  N/A DME Agency:  NA  HH Arranged:    [place] Agency:  NA  Status of Service:  Completed, signed off  If discussed at [employer] of Stay Meetings, dates discussed:    Additional Comments:  Carles Collet, RN 02/10/2016, 10:48 AM

## 2016-02-10 NOTE — Progress Notes (Addendum)
Patient ID: [name], female   DOB: 11/26/1928, 82 y.o.   MRN: [id number]  PROGRESS NOTE    [name]  [id number] DOB: June 02, 1929 DOA: 02/06/2016  PCP: [name], MD   Brief Narrative:  82 year old female with past medical history significant for coronary artery disease, on aspirin and Plavix, dementia, chronic kidney disease stage IV, lives at home with her daughter. She presented to [facility] with shortness of breath for a few days prior to this admission. She was started on IV Lasix for possible decompensated CHF and on antibiotics for possible pneumonia.   Assessment & Plan:   Acute diastolic heart failure - Stable respiratory status, off BiPAP - She is on Lasix 40 mg IV every 12 hours but because of worsening renal function we will have to stop Lasix IV - Monitor renal function while off of IV Lasix - Patient's last 2-D echo with ejection fraction 65% - Weight in past 72 hours: 71.6 kg --> 71.8 kg --> 70.7 kg - Continue daily weight and strict intake and output  Lobar pneumonia, unspecified organism - Blood cultures so far negative  - S/p 2days of Rocephin and azithromycin - Patient on Ceftin at this time which she will continue for 3 more days from today   Essential hypertension - Continue Norvasc 10 mg daily  Coronary artery disease, native artery without angina pectoris - Stable, continue ASA, plavix, statin  Diabetes mellitus type 2 with diabetic nephropathy with long-term insulin use / diabetic neuropathy - CBGs in past 24 hours: 117, 331, 80 - Continue Levemir 25 units twice daily - Continue Lyrica for diabetic neuropathy  Dementia without behavioral disturbance - Stable, continue Namenda - Continue celexa   Chronic kidney disease stage IV - Patient creatinine 2.6 and continues to trend up - Creatinine 3.23 this morning, likely secondary to IV Lasix which we will stop today - Check creatinine tomorrow  Anemia of chronic kidney disease    - Hemoglobin stable   History of breast cancer - In remission   DVT prophylaxis: SCDs bilaterally Code Status: full code  Family Communication: Family not at the bedside Disposition Plan: To skilled nursing facility likely tomorrow if creatinine improving   Consultants:   None   Procedures:   None  Antimicrobials:   Ceftin    Subjective: No overnight events.   Objective: Filed Vitals:   02/09/16 0804 02/09/16 1453 02/09/16 2130 02/10/16 0603  BP: 147/41 135/54 185/60 160/57  Pulse: 61 66 66 64  Temp:   99.3 F (37.4 C) 98.4 F (36.9 C)  TempSrc:   Oral   Resp:  20 19 19   Height:      Weight:   70.761 kg (156 lb)   SpO2: 98% 99% 100% 98%    Intake/Output Summary (Last 24 hours) at 02/10/16 1013 Last data filed at 02/09/16 1600  Gross per 24 hour  Intake    240 ml  Output      0 ml  Net    240 ml   Filed Weights   02/08/16 0616 02/09/16 0517 02/09/16 2130  Weight: 71.6 kg (157 lb 13.6 oz) 71.8 kg (158 lb 4.6 oz) 70.761 kg (156 lb)    Examination:  General exam: Appears calm and comfortable  Respiratory system: Clear to auscultation. Respiratory effort normal. Cardiovascular system: S1 & S2 heard, Rate controlled  Gastrointestinal system: Abdomen is nondistended, soft and nontender. No organomegaly or masses felt. Normal bowel sounds heard. Central nervous system: No focal neurological  deficits. Extremities: Symmetric 5 x 5 power. Skin: No rashes, lesions or ulcers Psychiatry: Mood & affect appropriate.   Data Reviewed: I have personally reviewed following labs and imaging studies  CBC:  Recent Labs Lab 02/07/16 0010 02/07/16 0020 02/08/16 0657 02/09/16 0750 02/10/16 0616  WBC 8.4  --  7.0 6.4 6.0  NEUTROABS 5.2  --   --   --   --   HGB 10.1* 11.2* 10.1* 10.5* 9.9*  HCT 30.9* 33.0* 30.8* 31.5* 29.6*  MCV 91.7  --  90.6 89.2 89.7  PLT 200  --  171 186 [id number]   Basic Metabolic Panel:  Recent Labs Lab 02/07/16 0020 02/07/16 0642  02/08/16 0657 02/09/16 0749 02/10/16 0616  NA 142 134* 136 136 135  K 4.3 4.9 4.3 4.0 4.3  CL 105 104 105 103 101  CO2  --  19* 23 24 24   GLUCOSE 305* 353* 139* 94 113*  BUN 28* 28* 25* 27* 41*  CREATININE 2.20* 2.29* 2.22* 2.36* 3.23*  CALCIUM  --  8.0* 8.6* 8.8* 8.3*   GFR: Estimated Creatinine Clearance: 12 mL/min (by C-G formula based on Cr of 3.23). Liver Function Tests: No results for input(s): AST, ALT, ALKPHOS, BILITOT, PROT, ALBUMIN in the last 168 hours. No results for input(s): LIPASE, AMYLASE in the last 168 hours. No results for input(s): AMMONIA in the last 168 hours. Coagulation Profile:  Recent Labs Lab 02/07/16 0010  INR 1.06   Cardiac Enzymes: No results for input(s): CKTOTAL, CKMB, CKMBINDEX, TROPONINI in the last 168 hours. BNP (last 3 results) No results for input(s): PROBNP in the last 8760 hours. HbA1C:  Recent Labs  02/08/16 0657  HGBA1C 8.1*   CBG:  Recent Labs Lab 02/09/16 0808 02/09/16 1147 02/09/16 1718 02/09/16 2132 02/10/16 0802  GLUCAP 124* 222* 117* 331* 80   Lipid Profile: No results for input(s): CHOL, HDL, LDLCALC, TRIG, CHOLHDL, LDLDIRECT in the last 72 hours. Thyroid Function Tests: No results for input(s): TSH, T4TOTAL, FREET4, T3FREE, THYROIDAB in the last 72 hours. Anemia Panel: No results for input(s): VITAMINB12, FOLATE, FERRITIN, TIBC, IRON, RETICCTPCT in the last 72 hours. Urine analysis:    Component Value Date/Time   COLORURINE YELLOW 02/07/2016 0025   APPEARANCEUR CLEAR 02/07/2016 0025   LABSPEC 1.017 02/07/2016 0025   PHURINE 5.5 02/07/2016 0025   GLUCOSEU 250* 02/07/2016 0025   GLUCOSEU >=1000 08/24/2012 1148   HGBUR NEGATIVE 02/07/2016 0025   BILIRUBINUR NEGATIVE 02/07/2016 0025   KETONESUR NEGATIVE 02/07/2016 0025   PROTEINUR 100* 02/07/2016 0025   UROBILINOGEN 0.2 05/26/2015 1654   NITRITE NEGATIVE 02/07/2016 0025   LEUKOCYTESUR NEGATIVE 02/07/2016 0025   Sepsis  Labs: @LABRCNTIP (procalcitonin:4,lacticidven:4)   Culture, blood (Routine X 2) w Reflex to ID Panel     Status: None (Preliminary result)   Collection Time: 02/07/16  1:00 AM  Result Value Ref Range Status   Specimen Description BLOOD RIGHT HAND  Final   Special Requests IN PEDIATRIC BOTTLE 4CC  Final   Culture NO GROWTH 2 DAYS  Final   Report Status PENDING  Incomplete  Culture, blood (Routine X 2) w Reflex to ID Panel     Status: None (Preliminary result)   Collection Time: 02/07/16  1:15 AM  Result Value Ref Range Status   Specimen Description BLOOD RIGHT WRIST  Final   Special Requests IN PEDIATRIC BOTTLE 1CC  Final   Culture NO GROWTH 2 DAYS  Final   Report Status PENDING  Incomplete  MRSA PCR Screening  Status: None   Collection Time: 02/07/16  2:30 AM  Result Value Ref Range Status   MRSA by PCR NEGATIVE NEGATIVE Final      Radiology Studies: Dg Chest 2 View 02/08/2016   Aortic atherosclerosis. Minimal bilateral perihilar edema. Minimal bilateral pleural effusions. Electronically Signed   By: Marijo Conception, M.D.   On: 02/08/2016 14:48   Dg Chest Port 1 View 02/07/2016   Cardiomegaly with congestive changes. Right lung base hazy airspace density may represent superimposed pneumonia. Clinical correlation and follow-up recommended. Electronically Signed   By: Anner Crete M.D.   On: 02/07/2016 00:25       Scheduled Meds: . amLODipine  10 mg Oral Daily  . aspirin  81 mg Oral Daily  . atorvastatin  20 mg Oral q1800  . cefUROXime  250 mg Oral BID WC  . citalopram  5 mg Oral Daily  . clopidogrel  75 mg Oral Daily  . insulin aspart  0-15 Units Subcutaneous TID WC  . insulin aspart  0-5 Units Subcutaneous QHS  . insulin detemir  25 Units Subcutaneous BID  . memantine  10 mg Oral BID  . pantoprazole  40 mg Oral Daily  . pregabalin  50 mg Oral Daily  . sodium chloride flush  3 mL Intravenous Q12H   Continuous Infusions:    LOS: 3 days    Time spent: 25  minutes  Greater than 50% of the time spent on counseling and coordinating the care.   Leisa Lenz, MD [facility] Pager [phone]  If 7PM-7AM, please contact night-coverage [email or web address] Password [email or web address] 02/10/2016, 10:13 AM

## 2016-02-11 DIAGNOSIS — I2510 Atherosclerotic heart disease of native coronary artery without angina pectoris: Secondary | ICD-10-CM

## 2016-02-11 LAB — BASIC METABOLIC PANEL
ANION GAP: 9 (ref 5–15)
BUN: 49 mg/dL — ABNORMAL HIGH (ref 6–20)
CHLORIDE: 103 mmol/L (ref 101–111)
CO2: 24 mmol/L (ref 22–32)
CREATININE: 3.18 mg/dL — AB (ref 0.44–1.00)
Calcium: 8.6 mg/dL — ABNORMAL LOW (ref 8.9–10.3)
GFR calc non Af Amer: 12 mL/min — ABNORMAL LOW (ref >60)
GFR, EST AFRICAN AMERICAN: 14 mL/min — AB (ref >60)
Glucose, Bld: 46 mg/dL — ABNORMAL LOW (ref 65–99)
Potassium: 4.1 mmol/L (ref 3.5–5.1)
SODIUM: 136 mmol/L (ref 135–145)

## 2016-02-11 LAB — GLUCOSE, CAPILLARY
GLUCOSE-CAPILLARY: 48 mg/dL — AB (ref 65–99)
GLUCOSE-CAPILLARY: 82 mg/dL (ref 65–99)
GLUCOSE-CAPILLARY: 132 mg/dL — AB (ref 65–99)

## 2016-02-11 MED ORDER — INSULIN DETEMIR 100 UNIT/ML ~~LOC~~ SOLN: 25.0000 [IU] | Freq: Two times a day (BID) | SUBCUTANEOUS | Status: AC

## 2016-02-11 MED ORDER — INSULIN ASPART 100 UNIT/ML ~~LOC~~ SOLN: 0.0000 [IU] | Freq: Three times a day (TID) | SUBCUTANEOUS | Status: AC

## 2016-02-11 MED ORDER — CEFUROXIME AXETIL 250 MG PO TABS
250.0000 mg | ORAL_TABLET | Freq: Two times a day (BID) | ORAL | Status: DC
Start: 2016-02-11 — End: 2016-03-09

## 2016-02-11 MED ORDER — IPRATROPIUM-ALBUTEROL 0.5-2.5 (3) MG/3ML IN SOLN
3.0000 mL | RESPIRATORY_TRACT | Status: AC | PRN
Start: 2016-02-11 — End: ?

## 2016-02-11 NOTE — Discharge Instructions (Signed)
Cefuroxime tablets What is this medicine? CEFUROXIME (se fyoor OX eem) is a cephalosporin antibiotic. It is used to treat certain kinds of bacterial infections. It will not work for colds, flu, or other viral infections. This medicine may be used for other purposes; ask your health care provider or pharmacist if you have questions. What should I tell my health care provider before I take this medicine? They need to know if you have any of these conditions: -bleeding problems -bowel disease, like colitis -kidney disease -liver disease -an unusual or allergic reaction to cefuroxime, other antibiotics or medicines, foods, dyes or preservatives -pregnant or trying to get pregnant -breast-feeding How should I use this medicine? Take this medicine by mouth with a full glass of water. Follow the directions on the prescription label. Do not crush or chew. This medicine works best if you take it with food. Take your medicine at regular intervals. Do not take your medicine more often than directed. Take all of your medicine as directed even if you think your are better. Do not skip doses or stop your medicine early. Talk to your pediatrician regarding the use of this medicine in children. Special care may be needed. While this drug may be prescribed for children as young as 67 months of age for selected conditions, precautions do apply. Overdosage: If you think you have taken too much of this medicine contact a poison control center or emergency room at once. NOTE: This medicine is only for you. Do not share this medicine with others. What if I miss a dose? If you miss a dose, take it as soon as you can. If it is almost time for your next dose, take only that dose. Do not take double or extra doses. What may interact with this medicine? This medicine may interact with the following medications: -antacids -birth control pills -certain medicines for infection like amikacin, gentamicin,  tobramycin -diuretics -probenecid -warfarin This list may not describe all possible interactions. Give your health care provider a list of all the medicines, herbs, non-prescription drugs, or dietary supplements you use. Also tell them if you smoke, drink alcohol, or use illegal drugs. Some items may interact with your medicine. What should I watch for while using this medicine? Tell your doctor or health care professional if your symptoms do not improve or if you get new symptoms. Do not treat diarrhea with over the counter products. Contact your doctor if you have diarrhea that lasts more than 2 days or if it is severe and watery. This medicine can interfere with some urine glucose tests. If you use such tests, talk with your health care professional. If you are being treated for a sexually transmitted disease, avoid sexual contact until you have finished your treatment. Your sexual partner may also need treatment. What side effects may I notice from receiving this medicine? Side effects that you should report to your doctor or health care professional as soon as possible: -allergic reactions like skin rash, itching or hives, swelling of the face, lips, or tongue -dark urine -difficulty breathing -fever -irregular heartbeat or chest pain -redness, blistering, peeling or loosening of the skin, including inside the mouth -seizures -unusual bleeding or bruising -unusually weak or tired -white patches or sores in the mouth Side effects that usually do not require medical attention (report to your doctor or health care professional if they continue or are bothersome): -diarrhea -gas or heartburn -headache -nausea, vomiting -vaginal itching This list may not describe all possible side effects.  Call your doctor for medical advice about side effects. You may report side effects to FDA at 1-800-FDA-1088. Where should I keep my medicine? Keep out of the reach of children. Store at room  temperature between 15 and 30 degrees C (59 and 86 degrees F). Keep container tightly closed. Protect from moisture. Throw away any unused medicine after the expiration date. NOTE: This sheet is a summary. It may not cover all possible information. If you have questions about this medicine, talk to your doctor, pharmacist, or health care provider.    2016, Elsevier/Gold Standard. (2013-05-27 09:43:18)

## 2016-02-11 NOTE — Progress Notes (Signed)
Patient will discharge to [facility] SNF Anticipated discharge date: 7/20 Family notified: pt dtr Transportation by [facility]- will pick up RN at 12pm Report #: [id number]  [place] signing off.  Domenica Reamer, [place] Social Worker [phone]

## 2016-02-11 NOTE — Progress Notes (Signed)
[name] to be D/C'd to SNF per MD order.  Discussed with the patient and all questions fully answered.  VSS, Skin clean, dry and intact without evidence of skin break down, no evidence of skin tears noted. IV catheter discontinued intact. Site without signs and symptoms of complications. Dressing and pressure applied.  An After Visit Summary was printed and given to the patient. Patient received prescription.  D/c education completed with patient/family including follow up instructions, medication list, d/c activities limitations if indicated, with other d/c instructions as indicated by MD - patient able to verbalize understanding, all questions fully answered.   Patient instructed to return to ED, call 911, or call MD for any changes in condition.   Patient escorted via [place], and D/C to SNF via [facility].  Morley Kos Price 02/11/2016 3:21 PM

## 2016-02-11 NOTE — Care Management Important Message (Signed)
Important Message  Patient Details  Name: [name] MRN: [id number] Date of Birth: 1929/06/16   Medicare Important Message Given:  Yes    [name] 02/11/2016, 11:04 AM

## 2016-02-11 NOTE — Discharge Summary (Addendum)
Physician Discharge Summary  [name] [id number] DOB: Mar 27, 1929 DOA: 02/06/2016  PCP: [name], MD  Admit date: 02/06/2016 Discharge date: 02/11/2016  Recommendations for Outpatient Follow-up:  Continue ceftin for 3 more days on discharge  Discharge Diagnoses:  Active Problems:   CHF (congestive heart failure) ([place])   Acute respiratory failure (HCC)    Discharge Condition: stable   Diet recommendation: as tolerated   History of present illness:  82 year old female with past medical history significant for coronary artery disease, on aspirin and Plavix, dementia, chronic kidney disease stage IV, lives at home with her daughter. She presented to [facility] with shortness of breath for a few days prior to this admission. She was started on IV Lasix for possible decompensated CHF and on antibiotics for possible pneumonia.  Hospital Course:   Assessment & Plan:  Acute diastolic heart failure - Stable respiratory status, off BiPAP - She was on Lasix 40 mg IV every 12 hours but because of worsening renal function but we held it yesterday and we will continue to hold it until creatinine is back to baseline values of around 2.6.  - Her creatinine is better this morning, 3.18 - Patient's last 2-D echo with ejection fraction 65%  Lobar pneumonia, unspecified organism - Blood cultures so far negative  - S/p 2days of Rocephin and azithromycin - Patient on Ceftin, continue for 3 more days on discharge  Coronary artery disease, native artery without angina pectoris - Stable, continue ASA, plavix, statin  Diabetes mellitus type 2 with diabetic nephropathy with long-term insulin use / diabetic neuropathy - Continue Levemir 25 units twice dailyAnd sliding scale insulin on discharge - Continue Lyrica for diabetic neuropathy  Dementia without behavioral disturbance - Stable, continue Namenda - Continue celexa   Chronic kidney disease stage IV - Patient  creatinine 2.6 and continues to trend up - Creatinine 3.23 this morning, likely secondary to IV Lasix which we will stop today - Cr this am 3.18 - Continue to hold Lasix until creatinine back at baseline  Anemia of chronic kidney disease  - Hemoglobin stable   History of breast cancer - In remission   DVT prophylaxis: SCDs bilaterally Code Status: full code  Family Communication: Family not at the bedside   Consultants:   None  Procedures:   None  Antimicrobials:   Ceftin   Signed:  Leisa Lenz, MD  [facility] 02/11/2016, 8:43 AM  Pager #: [phone]  Time spent in minutes: less than 30 minutes   Discharge Exam: Filed Vitals:   02/10/16 2222 02/11/16 0602  BP: 146/50 168/49  Pulse: 65 71  Temp: 98.3 F (36.8 C) 97.8 F (36.6 C)  Resp: 16 20   Filed Vitals:   02/10/16 1018 02/10/16 1424 02/10/16 2222 02/11/16 0602  BP: 169/53 147/45 146/50 168/49  Pulse:  64 65 71  Temp:  98.7 F (37.1 C) 98.3 F (36.8 C) 97.8 F (36.6 C)  TempSrc:   Oral Oral  Resp:  19 16 20   Height:      Weight:    70.7 kg (155 lb 13.8 oz)  SpO2:  97% 91% 98%    General: Pt is alert, follows commands appropriately, not in acute distress Cardiovascular: Regular rate and rhythm, S1/S2 + Respiratory: Clear to auscultation bilaterally, no wheezing, no crackles, no rhonchi Abdominal: Soft, non tender, non distended, bowel sounds +, no guarding Extremities: no cyanosis, pulses palpable bilaterally DP and PT Neuro: Grossly nonfocal  Discharge Instructions  Discharge Instructions  Call MD for:  difficulty breathing, headache or visual disturbances    Complete by:  As directed      Call MD for:  persistant nausea and vomiting    Complete by:  As directed      Call MD for:  severe uncontrolled pain    Complete by:  As directed      Diet - low sodium heart healthy    Complete by:  As directed      Discharge instructions    Complete by:  As directed   Continue  ceftin for 3 more days on discharge     Increase activity slowly    Complete by:  As directed             Medication List    STOP taking these medications        furosemide 20 MG tablet  Commonly known as:  LASIX     pantoprazole 40 MG tablet  Commonly known as:  PROTONIX     sodium bicarbonate 650 MG tablet      TAKE these medications        acetaminophen 325 MG tablet  Commonly known as:  TYLENOL  Take 650 mg by mouth daily as needed (back pain).     amLODipine 10 MG tablet  Commonly known as:  NORVASC  Take 10 mg by mouth daily.     aspirin 81 MG tablet  Take 1 tablet (81 mg total) by mouth daily.     atorvastatin 20 MG tablet  Commonly known as:  LIPITOR  TAKE 1 TABLET BY MOUTH ONCE DAILY     cefUROXime 250 MG tablet  Commonly known as:  CEFTIN  Take 1 tablet (250 mg total) by mouth 2 (two) times daily with a meal.     citalopram 10 MG tablet  Commonly known as:  CELEXA  Take 5 mg by mouth daily.     clopidogrel 75 MG tablet  Commonly known as:  PLAVIX  Take 1 tablet (75 mg total) by mouth daily.     IMODIUM A-D 2 MG tablet  Generic drug:  loperamide  Take 2 mg by mouth every 4 (four) hours as needed for diarrhea or loose stools.     insulin aspart 100 UNIT/ML injection  Commonly known as:  novoLOG  Inject 0-15 Units into the skin 3 (three) times daily with meals.     insulin detemir 100 UNIT/ML injection  Commonly known as:  LEVEMIR  Inject 0.25 mLs (25 Units total) into the skin 2 (two) times daily.     ipratropium-albuterol 0.5-2.5 (3) MG/3ML Soln  Commonly known as:  DUONEB  Take 3 mLs by nebulization every 4 (four) hours as needed.     memantine 10 MG tablet  Commonly known as:  NAMENDA  Take 10 mg by mouth 2 (two) times daily.     pregabalin 50 MG capsule  Commonly known as:  LYRICA  Take 50 mg by mouth daily.     ranitidine 150 MG tablet  Commonly known as:  ZANTAC  Take 150 mg by mouth 2 (two) times daily.     triamcinolone  cream 0.1 %  Commonly known as:  KENALOG  APPLY TOPICALLY TWICE DAILY     Vitamin D3 50000 units Tabs  Take 50,000 Units by mouth every 30 (thirty) days. On or about the 1st week of each month           Follow-up Information    Follow  up with [name], MD. Schedule an appointment as soon as possible for a visit in 1 week.   Specialty:  Internal Medicine   Why:  Follow up appt after recent hospitalization   Contact information:   [address] [address] [phone]        The results of significant diagnostics from this hospitalization (including imaging, microbiology, ancillary and laboratory) are listed below for reference.    Significant Diagnostic Studies: Dg Chest 2 View  02/08/2016  CLINICAL DATA:  Shortness of breath. EXAM: CHEST  2 VIEW COMPARISON:  Radiographs of February 07, 2016. FINDINGS: Stable cardiomegaly. No pneumothorax is noted. Atherosclerosis of thoracic aorta is noted. Minimal bilateral pleural effusions are noted. Minimal bilateral perihilar edema may be present. Bony thorax is unremarkable. IMPRESSION: Aortic atherosclerosis. Minimal bilateral perihilar edema. Minimal bilateral pleural effusions. Electronically Signed   By: Marijo Conception, M.D.   On: 02/08/2016 14:48   Dg Chest Port 1 View  02/07/2016  CLINICAL DATA:  82 year old female with shortness of for EXAM: PORTABLE CHEST 1 VIEW COMPARISON:  Chest radiograph dated 11/18/2014 FINDINGS: Single portable view of the chest demonstrates diffuse interstitial prominence and nodularity, likely congestive changes. An area of increased density at the right lung base may represent superimposed pneumonia. Clinical correlation and follow-up recommended. There is blunting of the right costophrenic angle likely related to airspace disease. Small right pleural effusion is not excluded. There is no pneumothorax. Stable cardiomegaly. No acute fracture. IMPRESSION: Cardiomegaly with congestive changes. Right  lung base hazy airspace density may represent superimposed pneumonia. Clinical correlation and follow-up recommended. Electronically Signed   By: Anner Crete M.D.   On: 02/07/2016 00:25    Microbiology: Recent Results (from the past 240 hour(s))  Culture, blood (Routine X 2) w Reflex to ID Panel     Status: None (Preliminary result)   Collection Time: 02/07/16  1:00 AM  Result Value Ref Range Status   Specimen Description BLOOD RIGHT HAND  Final   Special Requests IN PEDIATRIC BOTTLE 4CC  Final   Culture NO GROWTH 3 DAYS  Final   Report Status PENDING  Incomplete  Culture, blood (Routine X 2) w Reflex to ID Panel     Status: None (Preliminary result)   Collection Time: 02/07/16  1:15 AM  Result Value Ref Range Status   Specimen Description BLOOD RIGHT WRIST  Final   Special Requests IN PEDIATRIC BOTTLE 1CC  Final   Culture NO GROWTH 3 DAYS  Final   Report Status PENDING  Incomplete  MRSA PCR Screening     Status: None   Collection Time: 02/07/16  2:30 AM  Result Value Ref Range Status   MRSA by PCR NEGATIVE NEGATIVE Final    Comment:        The GeneXpert MRSA Assay (FDA approved for NASAL specimens only), is one component of a comprehensive MRSA colonization surveillance program. It is not intended to diagnose MRSA infection nor to guide or monitor treatment for MRSA infections.      Labs: Basic Metabolic Panel:  Recent Labs Lab 02/07/16 0642 02/08/16 0657 02/09/16 0749 02/10/16 0616 02/11/16 0615  NA 134* 136 136 135 136  K 4.9 4.3 4.0 4.3 4.1  CL 104 105 103 101 103  CO2 19* 23 24 24 24   GLUCOSE 353* 139* 94 113* 46*  BUN 28* 25* 27* 41* 49*  CREATININE 2.29* 2.22* 2.36* 3.23* 3.18*  CALCIUM 8.0* 8.6* 8.8* 8.3* 8.6*   Liver Function  Tests: No results for input(s): AST, ALT, ALKPHOS, BILITOT, PROT, ALBUMIN in the last 168 hours. No results for input(s): LIPASE, AMYLASE in the last 168 hours. No results for input(s): AMMONIA in the last 168  hours. CBC:  Recent Labs Lab 02/07/16 0010 02/07/16 0020 02/08/16 0657 02/09/16 0750 02/10/16 0616  WBC 8.4  --  7.0 6.4 6.0  NEUTROABS 5.2  --   --   --   --   HGB 10.1* 11.2* 10.1* 10.5* 9.9*  HCT 30.9* 33.0* 30.8* 31.5* 29.6*  MCV 91.7  --  90.6 89.2 89.7  PLT 200  --  171 186 186   Cardiac Enzymes: No results for input(s): CKTOTAL, CKMB, CKMBINDEX, TROPONINI in the last 168 hours. BNP: BNP (last 3 results)  Recent Labs  02/07/16 0010 02/07/16 0642  BNP 290.6* 292.9*    ProBNP (last 3 results) No results for input(s): PROBNP in the last 8760 hours.  CBG:  Recent Labs Lab 02/10/16 1157 02/10/16 1636 02/10/16 2223 02/11/16 0803 02/11/16 0826  GLUCAP 74 159* 174* 48* 82

## 2016-02-12 LAB — CULTURE, BLOOD (ROUTINE X 2)
CULTURE: NO GROWTH
CULTURE: NO GROWTH

## 2016-03-08 ENCOUNTER — Inpatient Hospital Stay (HOSPITAL_COMMUNITY)
Admission: EM | Admit: 2016-03-08 | Discharge: 2016-03-13 | DRG: 291 | Disposition: A | Discharge status: Home Health | Payer: Medicare (Managed Care) | Attending: Internal Medicine | Admitting: Internal Medicine

## 2016-03-08 ENCOUNTER — Encounter (HOSPITAL_COMMUNITY): Payer: Self-pay | Admitting: Emergency Medicine

## 2016-03-08 DIAGNOSIS — I130 Hypertensive heart and chronic kidney disease with heart failure and stage 1 through stage 4 chronic kidney disease, or unspecified chronic kidney disease: Principal | ICD-10-CM | POA: Diagnosis present

## 2016-03-08 DIAGNOSIS — R339 Retention of urine, unspecified: Secondary | ICD-10-CM

## 2016-03-08 DIAGNOSIS — R0602 Shortness of breath: Secondary | ICD-10-CM | POA: Diagnosis not present

## 2016-03-08 DIAGNOSIS — Z79899 Other long term (current) drug therapy: Secondary | ICD-10-CM

## 2016-03-08 DIAGNOSIS — Z853 Personal history of malignant neoplasm of breast: Secondary | ICD-10-CM

## 2016-03-08 DIAGNOSIS — I252 Old myocardial infarction: Secondary | ICD-10-CM

## 2016-03-08 DIAGNOSIS — J9601 Acute respiratory failure with hypoxia: Secondary | ICD-10-CM | POA: Diagnosis present

## 2016-03-08 DIAGNOSIS — R079 Chest pain, unspecified: Secondary | ICD-10-CM | POA: Diagnosis present

## 2016-03-08 DIAGNOSIS — Z955 Presence of coronary angioplasty implant and graft: Secondary | ICD-10-CM

## 2016-03-08 DIAGNOSIS — Z794 Long term (current) use of insulin: Secondary | ICD-10-CM

## 2016-03-08 DIAGNOSIS — Z951 Presence of aortocoronary bypass graft: Secondary | ICD-10-CM

## 2016-03-08 DIAGNOSIS — F0390 Unspecified dementia without behavioral disturbance: Secondary | ICD-10-CM | POA: Diagnosis present

## 2016-03-08 DIAGNOSIS — Z792 Long term (current) use of antibiotics: Secondary | ICD-10-CM

## 2016-03-08 DIAGNOSIS — E875 Hyperkalemia: Secondary | ICD-10-CM | POA: Diagnosis present

## 2016-03-08 DIAGNOSIS — D638 Anemia in other chronic diseases classified elsewhere: Secondary | ICD-10-CM | POA: Diagnosis present

## 2016-03-08 DIAGNOSIS — K219 Gastro-esophageal reflux disease without esophagitis: Secondary | ICD-10-CM | POA: Diagnosis present

## 2016-03-08 DIAGNOSIS — Z9842 Cataract extraction status, left eye: Secondary | ICD-10-CM

## 2016-03-08 DIAGNOSIS — I161 Hypertensive emergency: Secondary | ICD-10-CM | POA: Diagnosis present

## 2016-03-08 DIAGNOSIS — Z7902 Long term (current) use of antithrombotics/antiplatelets: Secondary | ICD-10-CM

## 2016-03-08 DIAGNOSIS — R0600 Dyspnea, unspecified: Secondary | ICD-10-CM

## 2016-03-08 DIAGNOSIS — Z8249 Family history of ischemic heart disease and other diseases of the circulatory system: Secondary | ICD-10-CM

## 2016-03-08 DIAGNOSIS — E785 Hyperlipidemia, unspecified: Secondary | ICD-10-CM | POA: Diagnosis present

## 2016-03-08 DIAGNOSIS — E7800 Pure hypercholesterolemia, unspecified: Secondary | ICD-10-CM | POA: Diagnosis present

## 2016-03-08 DIAGNOSIS — I10 Essential (primary) hypertension: Secondary | ICD-10-CM | POA: Diagnosis present

## 2016-03-08 DIAGNOSIS — E1151 Type 2 diabetes mellitus with diabetic peripheral angiopathy without gangrene: Secondary | ICD-10-CM | POA: Diagnosis present

## 2016-03-08 DIAGNOSIS — Z87891 Personal history of nicotine dependence: Secondary | ICD-10-CM

## 2016-03-08 DIAGNOSIS — I509 Heart failure, unspecified: Secondary | ICD-10-CM

## 2016-03-08 DIAGNOSIS — I5033 Acute on chronic diastolic (congestive) heart failure: Secondary | ICD-10-CM | POA: Diagnosis present

## 2016-03-08 DIAGNOSIS — Z9841 Cataract extraction status, right eye: Secondary | ICD-10-CM

## 2016-03-08 DIAGNOSIS — Z888 Allergy status to other drugs, medicaments and biological substances status: Secondary | ICD-10-CM

## 2016-03-08 DIAGNOSIS — F329 Major depressive disorder, single episode, unspecified: Secondary | ICD-10-CM | POA: Diagnosis present

## 2016-03-08 DIAGNOSIS — I2510 Atherosclerotic heart disease of native coronary artery without angina pectoris: Secondary | ICD-10-CM | POA: Diagnosis present

## 2016-03-08 DIAGNOSIS — N184 Chronic kidney disease, stage 4 (severe): Secondary | ICD-10-CM | POA: Diagnosis present

## 2016-03-08 DIAGNOSIS — E1122 Type 2 diabetes mellitus with diabetic chronic kidney disease: Secondary | ICD-10-CM | POA: Diagnosis present

## 2016-03-08 DIAGNOSIS — Z961 Presence of intraocular lens: Secondary | ICD-10-CM | POA: Diagnosis present

## 2016-03-08 HISTORY — DX: Heart failure, unspecified: I50.9

## 2016-03-08 HISTORY — DX: Reserved for inherently not codable concepts without codable children: IMO0001

## 2016-03-08 NOTE — ED Provider Notes (Signed)
[facility] DEPT Provider Note   CSN: [id number] Arrival date & time: 03/08/16  2327  By signing my name below, I, Higinio Plan, attest that this documentation has been prepared under the direction and in the presence of Merryl Hacker, MD . Electronically Signed: Higinio Plan, Scribe. 03/08/2016. 12:24 AM.  History   Chief Complaint Chief Complaint  Patient presents with  . Chest Pain  . Shortness of Breath  . Weakness   The history is provided by the patient. No language interpreter was used.   HPI Comments: [name] is a 82 y.o. female with PMHx of chronic kidney disease, DM, HTN and HLD, who presents to the Emergency Department complaining of gradually worsening shortness of breath and chest pain that began last night. Reports orthopnea.  She notes she is not currently experiencing chest pain now in the ED. Per daughter, pt has had associated cough for the past few days. She denies fever, leg swelling, nausea and vomiting. Per daughter, pt has taken all of her HTN medication today; she notes her medication was recently adjusted by her PCP. Pt's daughter reports pt was recently admitted to [facility].  Past Medical History:  Diagnosis Date  . ANEMIA   . Anemia of chronic disease   . Angina   . Breast cancer ([place]) 2000   right  . CAD 2000, 2011   MI 2000, NQWMI 09/2009  . Chronic kidney disease (CKD), stage IV (severe) ([place])   . Dementia   . DEPRESSION   . DIABETES MELLITUS, TYPE II    renal, neuro - peripheral neuropathy  . Hypercholesteremia   . HYPERTENSION   . Inferior MI ([place]) 2000   "quiet"  . INSOMNIA, CHRONIC   . Loose stools   . Neurodermatitis   . Non-Q wave myocardial infarction ([place]) 10/2009   /E-chart  . PVD     Patient Active Problem List   Diagnosis Date Noted  . CHF (congestive heart failure) ([place]) 02/07/2016  . Acute respiratory failure ([place]) 02/07/2016  . Uncontrolled type 2 diabetes mellitus with stage 4 chronic kidney disease, with long-term  current use of insulin ([place])   . Hyperkalemia 07/18/2015  . Lethargy 06/04/2015  . Diabetic nephropathies ([place]) 05/27/2015  . Acute renal failure superimposed on stage 3 chronic kidney disease ([place]) 05/27/2015  . AKI (acute kidney injury) ([place])   . Hyperglycemia 05/26/2015  . Uncontrolled diabetes mellitus with hyperosmolarity, with long-term current use of insulin ([place]) 05/26/2015  . Non-compliant behavior 04/01/2015  . Syncope 11/18/2014  . Skin change   . Breast cancer ([place])   . Edema   . Hyperosmolar (nonketotic) coma ([place]) 04/01/2012  . Fracture of left toe 04/01/2012  . CKD (chronic kidney disease) stage 4, GFR 15-29 ml/min (HCC) 04/01/2012  . Unstable angina ([place]) 11/21/2011  . Low back pain   . SYNCOPE 09/07/2009  . Depression 06/24/2009  . Dementia 06/24/2009  . INSOMNIA, CHRONIC 03/25/2009  . Hyperlipidemia 03/10/2009  . ANEMIA 03/10/2009  . Coronary atherosclerosis 03/10/2009  . PVD 03/10/2009  . Uncontrolled type 2 diabetes with renal manifestation ([place]) 03/09/2009  . Essential hypertension 03/09/2009    Past Surgical History:  Procedure Laterality Date  . BREAST LUMPECTOMY  2000   right  . CAPSULECTOMY  12/27/2010   L eye  . CARDIAC SURGERY    . CATARACT EXTRACTION W/ INTRAOCULAR LENS  IMPLANT, BILATERAL    . COMBINED HYSTEROSCOPY DIAGNOSTIC / D&C  07/2004   /e-chart  . CORONARY ANGIOPLASTY WITH STENT  PLACEMENT     "twice"  . CORONARY ARTERY BYPASS GRAFT  2000   CABG X1  . DILATION AND CURETTAGE OF UTERUS    . EYE SURGERY     Cataracts (R) eye 06/1999 & (L) eye 09/1999- [facility]  . LEFT HEART CATHETERIZATION WITH CORONARY ANGIOGRAM N/A 11/22/2011   Procedure: LEFT HEART CATHETERIZATION WITH CORONARY ANGIOGRAM;  Surgeon: Clent Demark, MD;  Location: [facility] CATH LAB;  Service: Cardiovascular;  Laterality: N/A;  . LEFT HEART CATHETERIZATION WITH CORONARY ANGIOGRAM N/A 10/03/2012   Procedure: LEFT HEART CATHETERIZATION WITH CORONARY ANGIOGRAM;  Surgeon: Clent Demark, MD;  Location: [facility] CATH LAB;  Service: Cardiovascular;  Laterality: N/A;    OB History    No data available       Home Medications    Prior to Admission medications   Medication Sig Start Date End Date Taking? Authorizing Provider  acetaminophen (TYLENOL) 325 MG tablet Take 650 mg by mouth daily as needed (back pain).   Yes Historical Provider, MD  amLODipine (NORVASC) 10 MG tablet Take 10 mg by mouth daily.   Yes Historical Provider, MD  aspirin 81 MG tablet Take 1 tablet (81 mg total) by mouth daily. 11/23/11  Yes Charolette Forward, MD  atorvastatin (LIPITOR) 20 MG tablet TAKE 1 TABLET BY MOUTH ONCE DAILY 05/05/15  Yes Hoyt Koch, MD  Cholecalciferol (VITAMIN D3) 50000 units TABS Take 50,000 Units by mouth every 30 (thirty) days. On or about the 1st week of each month   Yes Historical Provider, MD  citalopram (CELEXA) 10 MG tablet Take 5 mg by mouth daily.    Yes Historical Provider, MD  clopidogrel (PLAVIX) 75 MG tablet Take 1 tablet (75 mg total) by mouth daily. 12/29/14  Yes Rowe Clack, MD  insulin aspart (NOVOLOG) 100 UNIT/ML injection Inject 0-15 Units into the skin 3 (three) times daily with meals. 02/11/16  Yes Robbie Lis, MD  insulin detemir (LEVEMIR) 100 UNIT/ML injection Inject 0.25 mLs (25 Units total) into the skin 2 (two) times daily. 02/11/16  Yes Robbie Lis, MD  ipratropium-albuterol (DUONEB) 0.5-2.5 (3) MG/3ML SOLN Take 3 mLs by nebulization every 4 (four) hours as needed. 02/11/16  Yes Robbie Lis, MD  loperamide (IMODIUM A-D) 2 MG tablet Take 2 mg by mouth every 4 (four) hours as needed for diarrhea or loose stools.   Yes Historical Provider, MD  memantine (NAMENDA) 10 MG tablet Take 10 mg by mouth 2 (two) times daily.   Yes Historical Provider, MD  pregabalin (LYRICA) 50 MG capsule Take 50 mg by mouth daily.   Yes Historical Provider, MD  ranitidine (ZANTAC) 150 MG tablet Take 150 mg by mouth 2 (two) times daily.   Yes Historical Provider, MD    triamcinolone cream (KENALOG) 0.1 % APPLY TOPICALLY TWICE DAILY Patient taking differently: APPLY TOPICALLY TWICE DAILY AS NEEDED FOR SPOTS ON ARMS AND LEGS 06/15/15  Yes Rowe Clack, MD  cefUROXime (CEFTIN) 250 MG tablet Take 1 tablet (250 mg total) by mouth 2 (two) times daily with a meal. Patient not taking: Reported on 03/08/2016 02/11/16   Robbie Lis, MD    Family History Family History  Problem Relation Age of Onset  . Coronary artery disease Father     Social History Social History  Substance Use Topics  . Smoking status: Former Smoker    Packs/day: 1.00    Years: 20.00    Types: Cigarettes  . Smokeless tobacco: Never Used  Comment: 11/21/11 "quit smoking 30-40 years ago  . Alcohol use No     Comment: 11/21/11 "used to drink quit alot; last drink of alcohol maybe 30 years ago"     Allergies   Ace inhibitors  Review of Systems Review of Systems  Constitutional: Negative for fever.  Respiratory: Positive for cough and shortness of breath.   Cardiovascular: Positive for chest pain. Negative for leg swelling.  Gastrointestinal: Negative for abdominal pain, nausea and vomiting.  All other systems reviewed and are negative.  Physical Exam Updated Vital Signs BP (!) 158/52 (BP Location: Right Arm)   Pulse 78   Temp 100 F (37.8 C) (Rectal)   Resp 19   Ht 5\' 3"  (1.6 m)   Wt 150 lb (68 kg)   SpO2 98%   BMI 26.57 kg/m   Physical Exam  Constitutional: She is oriented to person, place, and time.  Elderly, chronically ill-appearing  HENT:  Head: Normocephalic and atraumatic.  Eyes: Pupils are equal, round, and reactive to light.  Cardiovascular: Normal rate, regular rhythm and normal heart sounds.   No murmur heard. Pulmonary/Chest: Effort normal. No respiratory distress. She has wheezes.  Scant wheeze, on home O2, crackles in the bases  Abdominal: Soft. Bowel sounds are normal. There is no tenderness. There is no guarding.  Musculoskeletal: She  exhibits edema.  Neurological: She is alert and oriented to person, place, and time.  Skin: Skin is warm and dry.  Psychiatric: She has a normal mood and affect.  Nursing note and vitals reviewed.   ED Treatments / Results  Labs (all labs ordered are listed, but only abnormal results are displayed) Labs Reviewed  BASIC METABOLIC PANEL - Abnormal; Notable for the following:       Result Value   Potassium 5.8 (*)    Chloride 112 (*)    CO2 17 (*)    Glucose, Bld 288 (*)    BUN 33 (*)    Creatinine, Ser 2.45 (*)    Calcium 8.3 (*)    GFR calc non Af Amer 17 (*)    GFR calc Af Amer 20 (*)    All other components within normal limits  CBC - Abnormal; Notable for the following:    RBC 3.31 (*)    Hemoglobin 10.0 (*)    HCT 31.3 (*)    All other components within normal limits  I-STAT TROPOININ, ED    EKG  EKG Interpretation  Date/Time:  Tuesday March 08 2016 23:32:38 EDT Ventricular Rate:  77 PR Interval:    QRS Duration: 91 QT Interval:  389 QTC Calculation: 441 R Axis:   -28 Text Interpretation:  Sinus rhythm Probable left atrial enlargement Inferior infarct, old Anterior infarct, old No significant change since last tracing Confirmed by Siniya Lichty  MD, Jullisa Grigoryan ([id number]) on 03/08/2016 11:36:51 PM       Radiology Dg Chest 2 View  Result Date: 03/09/2016 CLINICAL DATA:  Chest pain and shortness of breath for 4 days. EXAM: CHEST  2 VIEW COMPARISON:  02/08/2016 FINDINGS: Cardiac enlargement with prominent central pulmonary vascularity. Diffuse interstitial pattern to the lungs suggesting edema. Small bilateral pleural effusions. Changes are progressing since previous study. No pneumothorax. Calcified and tortuous aorta. IMPRESSION: Cardiac enlargement with pulmonary vascular congestion, bilateral interstitial edema, and small bilateral effusions. Electronically Signed   By: Lucienne Capers M.D.   On: 03/09/2016 00:10    Procedures Procedures   CRITICAL CARE Performed by:  Merryl Hacker   Total critical  care time: 25 minutes  Critical care time was exclusive of separately billable procedures and treating other patients.  Critical care was necessary to treat or prevent imminent or life-threatening deterioration.  Critical care was time spent personally by me on the following activities: development of treatment plan with patient and/or surrogate as well as nursing, discussions with consultants, evaluation of patient's response to treatment, examination of patient, obtaining history from patient or surrogate, ordering and performing treatments and interventions, ordering and review of laboratory studies, ordering and review of radiographic studies, pulse oximetry and re-evaluation of patient's condition.   DIAGNOSTIC STUDIES:  Oxygen Saturation is 98% on RA, normal by my interpretation.    COORDINATION OF CARE:  12:17 AM Discussed treatment plan with pt at bedside and pt agreed to plan.  Medications Ordered in ED Medications  furosemide (LASIX) injection 40 mg (40 mg Intravenous Given 03/09/16 0127)  insulin aspart (novoLOG) injection 10 Units (10 Units Intravenous Given 03/09/16 0127)  dextrose 50 % solution 50 mL (50 mLs Intravenous Given 03/09/16 0127)    Initial Impression / Assessment and Plan / ED Course  I have reviewed the triage vital signs and the nursing notes.  Pertinent labs & imaging results that were available during my care of the patient were reviewed by me and considered in my medical decision making (see chart for details).  Clinical Course   Patient presents with worsening shortness of breath and orthopnea. Recent admission with diuresis. However, her renal function worsened and diuretics were stopped. Given physical exam, suspect volume overload.  Basic labwork obtained. Chest x-ray shows vascular congestion and pulmonary edema. She's also oxygen which she is not on at home. She was given 40 mg of IV Lasix. Creatinine is at her  baseline. She was also noted to be hyperkalemic. This is likely related to coming off of her diuretic and continuing to take supplemental potassium. She was given insulin and glucose. No acute EKG changes. Will admit to the hospital for gentle diuresis and monitoring of potassium.  I personally performed the services described in this documentation, which was scribed in my presence. The recorded information has been reviewed and is accurate.   Final Clinical Impressions(s) / ED Diagnoses   Final diagnoses:  Acute on chronic diastolic congestive heart failure (HCC)  Hyperkalemia    New Prescriptions New Prescriptions   No medications on file     Merryl Hacker, MD 03/09/16 [phone]

## 2016-03-08 NOTE — ED Triage Notes (Signed)
In EMS from home, reports CP, SOB, weakness since last night. Worsened today. Reports productive cough, dyspnea at rest noted. Labored breathing. Given 324 ASA. CBG 340. A/OX4.

## 2016-03-09 ENCOUNTER — Emergency Department (HOSPITAL_COMMUNITY): Payer: Medicare (Managed Care)

## 2016-03-09 DIAGNOSIS — I5033 Acute on chronic diastolic (congestive) heart failure: Secondary | ICD-10-CM | POA: Diagnosis not present

## 2016-03-09 DIAGNOSIS — Z79899 Other long term (current) drug therapy: Secondary | ICD-10-CM | POA: Diagnosis not present

## 2016-03-09 DIAGNOSIS — Z792 Long term (current) use of antibiotics: Secondary | ICD-10-CM | POA: Diagnosis not present

## 2016-03-09 DIAGNOSIS — I161 Hypertensive emergency: Secondary | ICD-10-CM | POA: Diagnosis not present

## 2016-03-09 DIAGNOSIS — I509 Heart failure, unspecified: Secondary | ICD-10-CM

## 2016-03-09 DIAGNOSIS — N184 Chronic kidney disease, stage 4 (severe): Secondary | ICD-10-CM | POA: Diagnosis not present

## 2016-03-09 DIAGNOSIS — I10 Essential (primary) hypertension: Secondary | ICD-10-CM

## 2016-03-09 DIAGNOSIS — Z951 Presence of aortocoronary bypass graft: Secondary | ICD-10-CM | POA: Diagnosis not present

## 2016-03-09 DIAGNOSIS — F0390 Unspecified dementia without behavioral disturbance: Secondary | ICD-10-CM | POA: Diagnosis not present

## 2016-03-09 DIAGNOSIS — Z87891 Personal history of nicotine dependence: Secondary | ICD-10-CM | POA: Diagnosis not present

## 2016-03-09 DIAGNOSIS — E785 Hyperlipidemia, unspecified: Secondary | ICD-10-CM

## 2016-03-09 DIAGNOSIS — E875 Hyperkalemia: Secondary | ICD-10-CM

## 2016-03-09 DIAGNOSIS — E1122 Type 2 diabetes mellitus with diabetic chronic kidney disease: Secondary | ICD-10-CM | POA: Diagnosis not present

## 2016-03-09 DIAGNOSIS — Z7902 Long term (current) use of antithrombotics/antiplatelets: Secondary | ICD-10-CM | POA: Diagnosis not present

## 2016-03-09 DIAGNOSIS — J9601 Acute respiratory failure with hypoxia: Secondary | ICD-10-CM | POA: Diagnosis not present

## 2016-03-09 DIAGNOSIS — K219 Gastro-esophageal reflux disease without esophagitis: Secondary | ICD-10-CM | POA: Diagnosis present

## 2016-03-09 DIAGNOSIS — R0602 Shortness of breath: Secondary | ICD-10-CM | POA: Diagnosis present

## 2016-03-09 DIAGNOSIS — Z8249 Family history of ischemic heart disease and other diseases of the circulatory system: Secondary | ICD-10-CM | POA: Diagnosis not present

## 2016-03-09 DIAGNOSIS — I130 Hypertensive heart and chronic kidney disease with heart failure and stage 1 through stage 4 chronic kidney disease, or unspecified chronic kidney disease: Secondary | ICD-10-CM | POA: Diagnosis not present

## 2016-03-09 DIAGNOSIS — D638 Anemia in other chronic diseases classified elsewhere: Secondary | ICD-10-CM | POA: Diagnosis not present

## 2016-03-09 DIAGNOSIS — Z888 Allergy status to other drugs, medicaments and biological substances status: Secondary | ICD-10-CM | POA: Diagnosis not present

## 2016-03-09 DIAGNOSIS — Z853 Personal history of malignant neoplasm of breast: Secondary | ICD-10-CM | POA: Diagnosis not present

## 2016-03-09 DIAGNOSIS — Z794 Long term (current) use of insulin: Secondary | ICD-10-CM | POA: Diagnosis not present

## 2016-03-09 DIAGNOSIS — I252 Old myocardial infarction: Secondary | ICD-10-CM | POA: Diagnosis not present

## 2016-03-09 DIAGNOSIS — Z955 Presence of coronary angioplasty implant and graft: Secondary | ICD-10-CM | POA: Diagnosis not present

## 2016-03-09 DIAGNOSIS — E7800 Pure hypercholesterolemia, unspecified: Secondary | ICD-10-CM | POA: Diagnosis not present

## 2016-03-09 DIAGNOSIS — R079 Chest pain, unspecified: Secondary | ICD-10-CM | POA: Diagnosis present

## 2016-03-09 DIAGNOSIS — E118 Type 2 diabetes mellitus with unspecified complications: Secondary | ICD-10-CM | POA: Diagnosis not present

## 2016-03-09 LAB — CBC
HEMATOCRIT: 31.3 % — AB (ref 36.0–46.0)
HEMOGLOBIN: 10 g/dL — AB (ref 12.0–15.0)
MCH: 30.2 pg (ref 26.0–34.0)
MCHC: 31.9 g/dL (ref 30.0–36.0)
MCV: 94.6 fL (ref 78.0–100.0)
Platelets: 212 10*3/uL (ref 150–400)
RBC: 3.31 MIL/uL — AB (ref 3.87–5.11)
RDW: 14.4 % (ref 11.5–15.5)
WBC: 8.3 10*3/uL (ref 4.0–10.5)

## 2016-03-09 LAB — GLUCOSE, CAPILLARY
GLUCOSE-CAPILLARY: 83 mg/dL (ref 65–99)
GLUCOSE-CAPILLARY: 103 mg/dL — AB (ref 65–99)
GLUCOSE-CAPILLARY: 111 mg/dL — AB (ref 65–99)
GLUCOSE-CAPILLARY: 82 mg/dL (ref 65–99)
Glucose-Capillary: 86 mg/dL (ref 65–99)
Glucose-Capillary: 49 mg/dL — ABNORMAL LOW (ref 65–99)

## 2016-03-09 LAB — BASIC METABOLIC PANEL
ANION GAP: 6 (ref 5–15)
ANION GAP: 7 (ref 5–15)
BUN: 32 mg/dL — ABNORMAL HIGH (ref 6–20)
BUN: 33 mg/dL — ABNORMAL HIGH (ref 6–20)
CALCIUM: 8.4 mg/dL — AB (ref 8.9–10.3)
CHLORIDE: 112 mmol/L — AB (ref 101–111)
CO2: 17 mmol/L — AB (ref 22–32)
CO2: 22 mmol/L (ref 22–32)
CREATININE: 2.45 mg/dL — AB (ref 0.44–1.00)
CREATININE: 2.54 mg/dL — AB (ref 0.44–1.00)
Calcium: 8.3 mg/dL — ABNORMAL LOW (ref 8.9–10.3)
Chloride: 112 mmol/L — ABNORMAL HIGH (ref 101–111)
GFR calc non Af Amer: 17 mL/min — ABNORMAL LOW (ref >60)
GFR, EST AFRICAN AMERICAN: 19 mL/min — AB (ref >60)
GFR, EST AFRICAN AMERICAN: 20 mL/min — AB (ref >60)
GFR, EST NON AFRICAN AMERICAN: 16 mL/min — AB (ref >60)
Glucose, Bld: 288 mg/dL — ABNORMAL HIGH (ref 65–99)
Glucose, Bld: 74 mg/dL (ref 65–99)
POTASSIUM: 5.8 mmol/L — AB (ref 3.5–5.1)
Potassium: 6 mmol/L — ABNORMAL HIGH (ref 3.5–5.1)
SODIUM: 136 mmol/L (ref 135–145)
SODIUM: 140 mmol/L (ref 135–145)

## 2016-03-09 LAB — I-STAT TROPONIN, ED: Troponin i, poc: 0.02 ng/mL (ref 0.00–0.08)

## 2016-03-09 LAB — POTASSIUM: Potassium: 5.8 mmol/L — ABNORMAL HIGH (ref 3.5–5.1)

## 2016-03-09 LAB — BRAIN NATRIURETIC PEPTIDE: B NATRIURETIC PEPTIDE 5: 416.4 pg/mL — AB (ref 0.0–100.0)

## 2016-03-09 MED ORDER — PREGABALIN 50 MG PO CAPS
50.0000 mg | ORAL_CAPSULE | Freq: Every day | ORAL | Status: DC
  Administered 2016-03-09 – 2016-03-13 (×5): 50 mg via ORAL
  Filled 2016-03-09 (×6): qty 1

## 2016-03-09 MED ORDER — FUROSEMIDE 10 MG/ML IJ SOLN
40.0000 mg | Freq: Every day | INTRAMUSCULAR | Status: DC
  Administered 2016-03-09 – 2016-03-11 (×3): 40 mg via INTRAVENOUS
  Filled 2016-03-09 (×3): qty 4

## 2016-03-09 MED ORDER — HYDRALAZINE HCL 20 MG/ML IJ SOLN
10.0000 mg | INTRAMUSCULAR | Status: DC | PRN
  Administered 2016-03-10: 10 mg via INTRAVENOUS
  Filled 2016-03-09: qty 1

## 2016-03-09 MED ORDER — ASPIRIN 81 MG PO CHEW
81.0000 mg | CHEWABLE_TABLET | Freq: Every day | ORAL | Status: DC
  Administered 2016-03-09 – 2016-03-13 (×5): 81 mg via ORAL
  Filled 2016-03-09 (×6): qty 1

## 2016-03-09 MED ORDER — CLOPIDOGREL BISULFATE 75 MG PO TABS
75.0000 mg | ORAL_TABLET | Freq: Every day | ORAL | Status: DC
  Administered 2016-03-09 – 2016-03-13 (×5): 75 mg via ORAL
  Filled 2016-03-09 (×6): qty 1

## 2016-03-09 MED ORDER — INSULIN ASPART 100 UNIT/ML ~~LOC~~ SOLN
10.0000 [IU] | Freq: Once | SUBCUTANEOUS | Status: AC
  Administered 2016-03-09: 10 [IU] via INTRAVENOUS
  Filled 2016-03-09: qty 1

## 2016-03-09 MED ORDER — CARVEDILOL 3.125 MG PO TABS
3.1250 mg | ORAL_TABLET | Freq: Two times a day (BID) | ORAL | Status: DC
  Administered 2016-03-09 – 2016-03-13 (×9): 3.125 mg via ORAL
  Filled 2016-03-09 (×10): qty 1

## 2016-03-09 MED ORDER — CARVEDILOL 3.125 MG PO TABS: 3.1250 mg | ORAL_TABLET | Freq: Two times a day (BID) | ORAL | Status: DC

## 2016-03-09 MED ORDER — FUROSEMIDE 10 MG/ML IJ SOLN
40.0000 mg | Freq: Once | INTRAMUSCULAR | Status: AC
  Administered 2016-03-09: 40 mg via INTRAVENOUS
  Filled 2016-03-09: qty 4

## 2016-03-09 MED ORDER — SODIUM CHLORIDE 0.9 % IV SOLN
250.0000 mL | INTRAVENOUS | Status: DC | PRN
Start: 2016-03-09 — End: 2016-03-11

## 2016-03-09 MED ORDER — FAMOTIDINE 20 MG PO TABS
20.0000 mg | ORAL_TABLET | Freq: Every day | ORAL | Status: DC
Start: 2016-03-10 — End: 2016-03-13
  Administered 2016-03-10 – 2016-03-12 (×3): 20 mg via ORAL
  Filled 2016-03-09 (×3): qty 1

## 2016-03-09 MED ORDER — MEMANTINE HCL 10 MG PO TABS
10.0000 mg | ORAL_TABLET | Freq: Two times a day (BID) | ORAL | Status: DC
  Administered 2016-03-09 – 2016-03-13 (×10): 10 mg via ORAL
  Filled 2016-03-09 (×12): qty 1

## 2016-03-09 MED ORDER — SODIUM POLYSTYRENE SULFONATE 15 GM/60ML PO SUSP
30.0000 g | Freq: Once | ORAL | Status: AC
  Administered 2016-03-09: 30 g via ORAL
  Filled 2016-03-09: qty 120

## 2016-03-09 MED ORDER — DEXTROSE 50 % IV SOLN
1.0000 | Freq: Once | INTRAVENOUS | Status: AC
  Administered 2016-03-09: 50 mL via INTRAVENOUS
  Filled 2016-03-09: qty 50

## 2016-03-09 MED ORDER — ONDANSETRON HCL 4 MG/2ML IJ SOLN: 4.0000 mg | Freq: Four times a day (QID) | INTRAMUSCULAR | Status: DC | PRN

## 2016-03-09 MED ORDER — IPRATROPIUM-ALBUTEROL 0.5-2.5 (3) MG/3ML IN SOLN
3.0000 mL | Freq: Four times a day (QID) | RESPIRATORY_TRACT | Status: DC
  Administered 2016-03-09 (×4): 3 mL via RESPIRATORY_TRACT
  Filled 2016-03-09 (×4): qty 3

## 2016-03-09 MED ORDER — FUROSEMIDE 20 MG PO TABS
20.0000 mg | ORAL_TABLET | Freq: Every day | ORAL | Status: DC
  Administered 2016-03-09: 20 mg via ORAL
  Filled 2016-03-09: qty 1

## 2016-03-09 MED ORDER — INSULIN DETEMIR 100 UNIT/ML ~~LOC~~ SOLN
25.0000 [IU] | Freq: Two times a day (BID) | SUBCUTANEOUS | Status: DC
  Administered 2016-03-09: 25 [IU] via SUBCUTANEOUS
  Filled 2016-03-09 (×5): qty 0.25

## 2016-03-09 MED ORDER — ENOXAPARIN SODIUM 30 MG/0.3ML ~~LOC~~ SOLN
30.0000 mg | SUBCUTANEOUS | Status: DC
  Administered 2016-03-09 – 2016-03-12 (×4): 30 mg via SUBCUTANEOUS
  Filled 2016-03-09 (×4): qty 0.3

## 2016-03-09 MED ORDER — INSULIN ASPART 100 UNIT/ML ~~LOC~~ SOLN: 0.0000 [IU] | Freq: Three times a day (TID) | SUBCUTANEOUS | Status: DC

## 2016-03-09 MED ORDER — SODIUM CHLORIDE 0.9% FLUSH
3.0000 mL | Freq: Two times a day (BID) | INTRAVENOUS | Status: DC
  Administered 2016-03-09 – 2016-03-11 (×4): 3 mL via INTRAVENOUS

## 2016-03-09 MED ORDER — SODIUM POLYSTYRENE SULFONATE 15 GM/60ML PO SUSP
15.0000 g | Freq: Once | ORAL | Status: AC
  Administered 2016-03-10: 15 g via ORAL
  Filled 2016-03-09: qty 60

## 2016-03-09 MED ORDER — ACETAMINOPHEN 325 MG PO TABS: 650.0000 mg | ORAL_TABLET | ORAL | Status: DC | PRN

## 2016-03-09 MED ORDER — FAMOTIDINE 20 MG PO TABS
20.0000 mg | ORAL_TABLET | Freq: Two times a day (BID) | ORAL | Status: DC
  Administered 2016-03-09 (×2): 20 mg via ORAL
  Filled 2016-03-09 (×2): qty 1

## 2016-03-09 MED ORDER — ATORVASTATIN CALCIUM 20 MG PO TABS
20.0000 mg | ORAL_TABLET | Freq: Every day | ORAL | Status: DC
  Administered 2016-03-09 – 2016-03-13 (×5): 20 mg via ORAL
  Filled 2016-03-09 (×6): qty 1

## 2016-03-09 MED ORDER — CITALOPRAM HYDROBROMIDE 10 MG PO TABS
5.0000 mg | ORAL_TABLET | Freq: Every day | ORAL | Status: DC
  Administered 2016-03-09 – 2016-03-13 (×5): 5 mg via ORAL
  Filled 2016-03-09 (×6): qty 1

## 2016-03-09 MED ORDER — SODIUM CHLORIDE 0.9% FLUSH: 3.0000 mL | INTRAVENOUS | Status: DC | PRN

## 2016-03-09 NOTE — Progress Notes (Signed)
PROGRESS NOTE    [name]  [id number] DOB: 1929-02-11 DOA: 03/08/2016 PCP: [name], MD   Brief Narrative: [name] is a 82 y.o. female with medical history significant of DM, HTN, HLD, CKD, and recent (7/16-7/20) admission for CHF and acute on CKD presenting with sob. She was admitted for evaluation of CHF exacerbation.   Assessment & Plan:   Principal Problem:   CHF (congestive heart failure) (HCC) Active Problems:   Hyperlipidemia   Essential hypertension   Dementia   CKD (chronic kidney disease) stage 4, GFR 15-29 ml/min (HCC)   Hyperkalemia   Mild acute on chronic diastolic heart failure.  Started on IV lasix.  Strict intake and output.  Daily weights.   Stage 4 CKD:  Monitor renal parameters on IV lasix.   Diabetes Mellitus: CBG (last 3)   Recent Labs  03/09/16 0712 03/09/16 1131 03/09/16 1603  GLUCAP 83 103* 111*    Resume SSI.   Hyperkalemia:  Probably from potassium supplements at home.  Kayexalate ordered.  Repeat K tonight.  EKG to check for peaked  T WAVES.   Hypertension Sub-optimal , prn hydralazine ordered.   Dementia: no agitation.   Hyperlipidemia; Resume     DVT prophylaxis: (Lovenox) Code Status: (Full/) Family Communication: noneat bedside. ) Disposition Plan: pending further evaluation.    Consultants:   none   Procedures:   Echocardiogram.    Antimicrobials: none    Subjective: Reports feeling better.   Objective: Vitals:   03/09/16 0200 03/09/16 0304 03/09/16 0314 03/09/16 0433  BP: 177/57  (!) 172/56   Pulse: 73  64   Resp: 13  16   Temp:   97.5 F (36.4 C)   TempSrc:   Oral   SpO2: 100%  100% 100%  Weight:  74.4 kg (164 lb)    Height:  5\' 3"  (1.6 m)      Intake/Output Summary (Last 24 hours) at 03/09/16 1307 Last data filed at 03/09/16 0838  Gross per 24 hour  Intake              360 ml  Output                0 ml  Net              360 ml   Filed Weights   03/08/16 2335  03/09/16 0304  Weight: 68 kg (150 lb) 74.4 kg (164 lb)    Examination:  General exam: Appears calm and comfortable  Respiratory system: Clear to auscultation. Respiratory effort normal. Cardiovascular system: S1 & S2 heard, RRR. No JVD, murmurs, rubs, gallops or clicks. No pedal edema. Gastrointestinal system: Abdomen is nondistended, soft and nontender. No organomegaly or masses felt. Normal bowel sounds heard. Central nervous system: alert and oriented.  Extremities: Symmetric 5 x 5 power. Skin: No rashes, lesions or ulcers     Data Reviewed: I have personally reviewed following labs and imaging studies  CBC:  Recent Labs Lab 03/09/16 0041  WBC 8.3  HGB 10.0*  HCT 31.3*  MCV 94.6  PLT 99991111   Basic Metabolic Panel:  Recent Labs Lab 03/09/16 0041  NA 136  K 5.8*  CL 112*  CO2 17*  GLUCOSE 288*  BUN 33*  CREATININE 2.45*  CALCIUM 8.3*   GFR: Estimated Creatinine Clearance: 16.2 mL/min (by C-G formula based on SCr of 2.45 mg/dL). Liver Function Tests: No results for input(s): AST, ALT, ALKPHOS, BILITOT, PROT, ALBUMIN in the last 168 hours.  No results for input(s): LIPASE, AMYLASE in the last 168 hours. No results for input(s): AMMONIA in the last 168 hours. Coagulation Profile: No results for input(s): INR, PROTIME in the last 168 hours. Cardiac Enzymes: No results for input(s): CKTOTAL, CKMB, CKMBINDEX, TROPONINI in the last 168 hours. BNP (last 3 results) No results for input(s): PROBNP in the last 8760 hours. HbA1C: No results for input(s): HGBA1C in the last 72 hours. CBG:  Recent Labs Lab 03/09/16 0409 03/09/16 0712 03/09/16 1131  GLUCAP 86 83 103*   Lipid Profile: No results for input(s): CHOL, HDL, LDLCALC, TRIG, CHOLHDL, LDLDIRECT in the last 72 hours. Thyroid Function Tests: No results for input(s): TSH, T4TOTAL, FREET4, T3FREE, THYROIDAB in the last 72 hours. Anemia Panel: No results for input(s): VITAMINB12, FOLATE, FERRITIN, TIBC, IRON,  RETICCTPCT in the last 72 hours. Sepsis Labs: No results for input(s): PROCALCITON, LATICACIDVEN in the last 168 hours.  No results found for this or any previous visit (from the past 240 hour(s)).       Radiology Studies: Dg Chest 2 View  Result Date: 03/09/2016 CLINICAL DATA:  Chest pain and shortness of breath for 4 days. EXAM: CHEST  2 VIEW COMPARISON:  02/08/2016 FINDINGS: Cardiac enlargement with prominent central pulmonary vascularity. Diffuse interstitial pattern to the lungs suggesting edema. Small bilateral pleural effusions. Changes are progressing since previous study. No pneumothorax. Calcified and tortuous aorta. IMPRESSION: Cardiac enlargement with pulmonary vascular congestion, bilateral interstitial edema, and small bilateral effusions. Electronically Signed   By: Lucienne Capers M.D.   On: 03/09/2016 00:10        Scheduled Meds: . aspirin  81 mg Oral Daily  . atorvastatin  20 mg Oral Daily  . carvedilol  3.125 mg Oral BID WC  . citalopram  5 mg Oral Daily  . clopidogrel  75 mg Oral Daily  . enoxaparin (LOVENOX) injection  30 mg Subcutaneous Q24H  . famotidine  20 mg Oral BID  . furosemide  20 mg Oral Daily  . insulin aspart  0-15 Units Subcutaneous TID WC  . insulin detemir  25 Units Subcutaneous BID  . ipratropium-albuterol  3 mL Nebulization Q6H  . memantine  10 mg Oral BID  . pregabalin  50 mg Oral Daily  . sodium chloride flush  3 mL Intravenous Q12H   Continuous Infusions:    LOS: 0 days    Time spent: 30 minutes.     Hosie Poisson, MD [facility] Pager [phone]  If 7PM-7AM, please contact night-coverage [email or web address] Password [email or web address] 03/09/2016, 1:07 PM

## 2016-03-09 NOTE — H&P (Signed)
History and Physical    [name] [id number] DOB: 1928-09-22 DOA: 03/08/2016  PCP: [name], MD Consultants:   Patient coming from: home - lives with daughter, Ubaldo Glassing  Chief Complaint: SOB  HPI: [name] is a 82 y.o. female with medical history significant of DM, HTN, HLD, CKD, and recent (7/16-7/20) admission for CHF and acute on CKD presenting with recurrent CHF exacerbation.  Patient has dementia, is a very poor historian, and was unaccompanied at the time of my evaluation.  She does endorse SOB.  Also reports that she couldn't see.  Asked granddaughter to take her to the store to get something because her leg was hurting.  Unsure why her leg was hurting.  No cough.  Didn't feel well when lying flat.  Sleeps on only 1 pillow.     ED Course: Per Dr. Dina Rich: Patient presents with worsening shortness of breath and orthopnea. Recent admission with diuresis. However, her renal function worsened and diuretics were stopped. Given physical exam, suspect volume overload.  Basic labwork obtained. Chest x-ray shows vascular congestion and pulmonary edema. She's also oxygen which she is not on at home. She was given 40 mg of IV Lasix. Creatinine is at her baseline. She was also noted to be hyperkalemic. This is likely related to coming off of her diuretic and continuing to take supplemental potassium. She was given insulin and glucose. No acute EKG changes. Will admit to the hospital for gentle diuresis and monitoring of potassium.  Review of Systems: As per HPI; otherwise 10 point review of systems reviewed and negative.   Ambulatory Status:  Ambulates with walker  Past Medical History:  Diagnosis Date  . ANEMIA   . Anemia of chronic disease   . Angina   . Breast cancer ([place]) 2000   right  . CAD 2000, 2011   MI 2000, NQWMI 09/2009  . Chronic kidney disease (CKD), stage IV (severe) ([place])   . Dementia   . DEPRESSION   . DIABETES MELLITUS, TYPE II    renal, neuro - peripheral  neuropathy  . Hypercholesteremia   . HYPERTENSION   . Inferior MI ([place]) 2000   "quiet"  . INSOMNIA, CHRONIC   . Loose stools   . Neurodermatitis   . Non-Q wave myocardial infarction ([place]) 10/2009   /E-chart  . PVD     Past Surgical History:  Procedure Laterality Date  . BREAST LUMPECTOMY  2000   right  . CAPSULECTOMY  12/27/2010   L eye  . CARDIAC SURGERY    . CATARACT EXTRACTION W/ INTRAOCULAR LENS  IMPLANT, BILATERAL    . COMBINED HYSTEROSCOPY DIAGNOSTIC / D&C  07/2004   /e-chart  . CORONARY ANGIOPLASTY WITH STENT PLACEMENT     "twice"  . CORONARY ARTERY BYPASS GRAFT  2000   CABG X1  . DILATION AND CURETTAGE OF UTERUS    . EYE SURGERY     Cataracts (R) eye 06/1999 & (L) eye 09/1999- [place]  . LEFT HEART CATHETERIZATION WITH CORONARY ANGIOGRAM N/A 11/22/2011   Procedure: LEFT HEART CATHETERIZATION WITH CORONARY ANGIOGRAM;  Surgeon: Clent Demark, MD;  Location: [facility] CATH LAB;  Service: Cardiovascular;  Laterality: N/A;  . LEFT HEART CATHETERIZATION WITH CORONARY ANGIOGRAM N/A 10/03/2012   Procedure: LEFT HEART CATHETERIZATION WITH CORONARY ANGIOGRAM;  Surgeon: Clent Demark, MD;  Location: [facility] CATH LAB;  Service: Cardiovascular;  Laterality: N/A;    Social History   Social History  . Marital status: Widowed    Spouse name: N/A  .  Number of children: N/A  . Years of education: N/A   Occupational History  . Not on file.   Social History Main Topics  . Smoking status: Former Smoker    Packs/day: 1.00    Years: 20.00    Types: Cigarettes  . Smokeless tobacco: Never Used     Comment: 11/21/11 "quit smoking 30-40 years ago  . Alcohol use No     Comment: 11/21/11 "used to drink quit alot; last drink of alcohol maybe 30 years ago"  . Drug use: No  . Sexual activity: Not Currently   Other Topics Concern  . Not on file   Social History Narrative   Lives alone with her dog. Very active in her church chair. Volunteers at [employer]    Allergies  Allergen  Reactions  . Ace Inhibitors Cough    Family History  Problem Relation Age of Onset  . Coronary artery disease Father     Prior to Admission medications   Medication Sig Start Date End Date Taking? Authorizing Provider  acetaminophen (TYLENOL) 325 MG tablet Take 650 mg by mouth daily as needed (back pain).   Yes Historical Provider, MD  amLODipine (NORVASC) 10 MG tablet Take 10 mg by mouth daily.   Yes Historical Provider, MD  aspirin 81 MG tablet Take 1 tablet (81 mg total) by mouth daily. 11/23/11  Yes Charolette Forward, MD  atorvastatin (LIPITOR) 20 MG tablet TAKE 1 TABLET BY MOUTH ONCE DAILY 05/05/15  Yes Hoyt Koch, MD  Cholecalciferol (VITAMIN D3) 50000 units TABS Take 50,000 Units by mouth every 30 (thirty) days. On or about the 1st week of each month   Yes Historical Provider, MD  citalopram (CELEXA) 10 MG tablet Take 5 mg by mouth daily.    Yes Historical Provider, MD  clopidogrel (PLAVIX) 75 MG tablet Take 1 tablet (75 mg total) by mouth daily. 12/29/14  Yes Rowe Clack, MD  insulin aspart (NOVOLOG) 100 UNIT/ML injection Inject 0-15 Units into the skin 3 (three) times daily with meals. 02/11/16  Yes Robbie Lis, MD  insulin detemir (LEVEMIR) 100 UNIT/ML injection Inject 0.25 mLs (25 Units total) into the skin 2 (two) times daily. 02/11/16  Yes Robbie Lis, MD  ipratropium-albuterol (DUONEB) 0.5-2.5 (3) MG/3ML SOLN Take 3 mLs by nebulization every 4 (four) hours as needed. 02/11/16  Yes Robbie Lis, MD  loperamide (IMODIUM A-D) 2 MG tablet Take 2 mg by mouth every 4 (four) hours as needed for diarrhea or loose stools.   Yes Historical Provider, MD  memantine (NAMENDA) 10 MG tablet Take 10 mg by mouth 2 (two) times daily.   Yes Historical Provider, MD  pregabalin (LYRICA) 50 MG capsule Take 50 mg by mouth daily.   Yes Historical Provider, MD  ranitidine (ZANTAC) 150 MG tablet Take 150 mg by mouth 2 (two) times daily.   Yes Historical Provider, MD  triamcinolone cream  (KENALOG) 0.1 % APPLY TOPICALLY TWICE DAILY Patient taking differently: APPLY TOPICALLY TWICE DAILY AS NEEDED FOR SPOTS ON ARMS AND LEGS 06/15/15  Yes Rowe Clack, MD  cefUROXime (CEFTIN) 250 MG tablet Take 1 tablet (250 mg total) by mouth 2 (two) times daily with a meal. Patient not taking: Reported on 03/08/2016 02/11/16   Robbie Lis, MD    Physical Exam: Vitals:   03/09/16 0200 03/09/16 0304 03/09/16 0314 03/09/16 0433  BP: 177/57  (!) 172/56   Pulse: 73  64   Resp: 13  16  Temp:   97.5 F (36.4 C)   TempSrc:   Oral   SpO2: 100%  100% 100%  Weight:  74.4 kg (164 lb)    Height:  5\' 3"  (1.6 m)       General: Appears calm and comfortable and is NAD but somewhat confused Eyes:  PERRL, EOMI, normal lids, iris ENT:  grossly normal hearing, lips & tongue, mmm Neck:  no LAD, masses or thyromegaly Cardiovascular:  RRR, no m/r/g. No LE edema.  Respiratory:  CTA bilaterally, no w/r/r. Normal respiratory effort. Abdomen:  soft, ntnd, NABS Skin:  no rash or induration seen on limited exam Musculoskeletal:  grossly normal tone BUE/BLE, good ROM, no bony abnormality; 1-2+ LE edema Psychiatric: grossly normal mood and affect, speech garbled, alert but not oriented other than to person Neurologic:  CN 2-12 grossly intact, moves all extremities in coordinated fashion, sensation intact  Labs on Admission: I have personally reviewed following labs and imaging studies  CBC:  Recent Labs Lab 03/09/16 0041  WBC 8.3  HGB 10.0*  HCT 31.3*  MCV 94.6  PLT 99991111   Basic Metabolic Panel:  Recent Labs Lab 03/09/16 0041  NA 136  K 5.8*  CL 112*  CO2 17*  GLUCOSE 288*  BUN 33*  CREATININE 2.45*  CALCIUM 8.3*   GFR: Estimated Creatinine Clearance: 16.2 mL/min (by C-G formula based on SCr of 2.45 mg/dL). Liver Function Tests: No results for input(s): AST, ALT, ALKPHOS, BILITOT, PROT, ALBUMIN in the last 168 hours. No results for input(s): LIPASE, AMYLASE in the last 168  hours. No results for input(s): AMMONIA in the last 168 hours. Coagulation Profile: No results for input(s): INR, PROTIME in the last 168 hours. Cardiac Enzymes: No results for input(s): CKTOTAL, CKMB, CKMBINDEX, TROPONINI in the last 168 hours. BNP (last 3 results) No results for input(s): PROBNP in the last 8760 hours. HbA1C: No results for input(s): HGBA1C in the last 72 hours. CBG:  Recent Labs Lab 03/09/16 0409  GLUCAP 86   Lipid Profile: No results for input(s): CHOL, HDL, LDLCALC, TRIG, CHOLHDL, LDLDIRECT in the last 72 hours. Thyroid Function Tests: No results for input(s): TSH, T4TOTAL, FREET4, T3FREE, THYROIDAB in the last 72 hours. Anemia Panel: No results for input(s): VITAMINB12, FOLATE, FERRITIN, TIBC, IRON, RETICCTPCT in the last 72 hours. Urine analysis:    Component Value Date/Time   COLORURINE YELLOW 02/07/2016 0025   APPEARANCEUR CLEAR 02/07/2016 0025   LABSPEC 1.017 02/07/2016 0025   PHURINE 5.5 02/07/2016 0025   GLUCOSEU 250 (A) 02/07/2016 0025   GLUCOSEU >=1000 08/24/2012 1148   HGBUR NEGATIVE 02/07/2016 0025   BILIRUBINUR NEGATIVE 02/07/2016 0025   KETONESUR NEGATIVE 02/07/2016 0025   PROTEINUR 100 (A) 02/07/2016 0025   UROBILINOGEN 0.2 05/26/2015 1654   NITRITE NEGATIVE 02/07/2016 0025   LEUKOCYTESUR NEGATIVE 02/07/2016 0025    Creatinine Clearance: Estimated Creatinine Clearance: 16.2 mL/min (by C-G formula based on SCr of 2.45 mg/dL).  Sepsis Labs: @LABRCNTIP (procalcitonin:4,lacticidven:4) )No results found for this or any previous visit (from the past 240 hour(s)).   Radiological Exams on Admission: Dg Chest 2 View  Result Date: 03/09/2016 CLINICAL DATA:  Chest pain and shortness of breath for 4 days. EXAM: CHEST  2 VIEW COMPARISON:  02/08/2016 FINDINGS: Cardiac enlargement with prominent central pulmonary vascularity. Diffuse interstitial pattern to the lungs suggesting edema. Small bilateral pleural effusions. Changes are progressing  since previous study. No pneumothorax. Calcified and tortuous aorta. IMPRESSION: Cardiac enlargement with pulmonary vascular congestion, bilateral interstitial edema, and  small bilateral effusions. Electronically Signed   By: Lucienne Capers M.D.   On: 03/09/2016 00:10    EKG: Independently reviewed.  NSR with rate 77; NSCSLT   Assessment/Plan Principal Problem:   CHF (congestive heart failure) (HCC) Active Problems:   Hyperlipidemia   Essential hypertension   Dementia   CKD (chronic kidney disease) stage 4, GFR 15-29 ml/min (HCC)   Hyperkalemia   HFpEF -Very mild CHF exacerbation - patient without obvious symptoms at the time of my evaluation -Echo in 4/16 showed EF 65-70% with no mention of diastolic function -Was taken off Lasix during prior hospitalization and it does not appear to have been restarted as an outpatient -Responded well to IV Lasix given in ER -Will resume low-dose Lasix PO -Consider repeat Echo -Did not have BNP, will order -Will not continue Norvasc at this time, as it may contribute to edema/CHF  Hyperkalemia -Likely iatrogenic, as patient has reportedly continued K+ supplementation despite not being on Lasix -Will hold K+ replacement for now and follow -As Lasix is being restarted, she may need potassium added back in the future -No peaked T waves of other obvious concerns for myocardial instability -Was given Lasix, insulin/glucose in the ER  HTN -Holding Norvasc -This appears to be her only BP agent and so she may need an additional medication added -Has reported ACE allergy  CKD -Baseline appears to be roughly 2.2 -Slightly above that today -Will follow after diuresis  Dementia -Limited history -May need ongoing help with medications and to ensure success at home    DVT prophylaxis: Lovenox  Code Status:  Full - confirmed with patient Family Communication: None present  Disposition Plan:  Home once clinically improved Consults called:  None  Admission status: Observation status, telemetry    Karmen Bongo MD [facility]  If 7PM-7AM, please contact night-coverage [email or web address] Password [email or web address]  03/09/2016, 6:20 AM

## 2016-03-09 NOTE — ED Notes (Signed)
Called main lab regarding K of 5.8, states that sample was not hemolyzed. EDP aware

## 2016-03-09 NOTE — ED Notes (Signed)
Admitting MD at bedside at this time.

## 2016-03-09 NOTE — Progress Notes (Signed)
Pt's K+ was 6.0 this evening. Orders for EKG, Lasix, and Kayexelate received. Repeat K+ = 5.8. Pt with first BM and with urine incontinence. MD paged. Will continue to monitor.

## 2016-03-10 ENCOUNTER — Encounter (HOSPITAL_COMMUNITY): Payer: Self-pay | Admitting: General Practice

## 2016-03-10 ENCOUNTER — Inpatient Hospital Stay (HOSPITAL_COMMUNITY): Payer: Medicare (Managed Care)

## 2016-03-10 DIAGNOSIS — I5033 Acute on chronic diastolic (congestive) heart failure: Secondary | ICD-10-CM

## 2016-03-10 DIAGNOSIS — E1122 Type 2 diabetes mellitus with diabetic chronic kidney disease: Secondary | ICD-10-CM

## 2016-03-10 DIAGNOSIS — I2510 Atherosclerotic heart disease of native coronary artery without angina pectoris: Secondary | ICD-10-CM

## 2016-03-10 DIAGNOSIS — Z7982 Long term (current) use of aspirin: Secondary | ICD-10-CM

## 2016-03-10 DIAGNOSIS — D649 Anemia, unspecified: Secondary | ICD-10-CM

## 2016-03-10 DIAGNOSIS — I509 Heart failure, unspecified: Secondary | ICD-10-CM

## 2016-03-10 DIAGNOSIS — Z955 Presence of coronary angioplasty implant and graft: Secondary | ICD-10-CM

## 2016-03-10 DIAGNOSIS — Z794 Long term (current) use of insulin: Secondary | ICD-10-CM

## 2016-03-10 LAB — CBC
HEMATOCRIT: 32.3 % — AB (ref 36.0–46.0)
HEMOGLOBIN: 10.2 g/dL — AB (ref 12.0–15.0)
MCH: 30.2 pg (ref 26.0–34.0)
MCHC: 31.6 g/dL (ref 30.0–36.0)
MCV: 95.6 fL (ref 78.0–100.0)
Platelets: 201 10*3/uL (ref 150–400)
RBC: 3.38 MIL/uL — ABNORMAL LOW (ref 3.87–5.11)
RDW: 14.5 % (ref 11.5–15.5)
WBC: 7 10*3/uL (ref 4.0–10.5)

## 2016-03-10 LAB — ECHOCARDIOGRAM COMPLETE
E decel time: 254 ms
FS: 24 % — AB (ref 28–44)
Height: 63 in
IVS/LV PW RATIO, ED: 0.91
LA ID, A-P, ES: 39 mm
LA diam end sys: 39 mm
LA diam index: 2.23 cm/m2
LA vol A4C: 66.9 mL
LA vol index: 36.8 mL/m2
LA vol: 64.4 mL
LV PW d: 11 mm — AB (ref 0.6–1.1)
LV e' LATERAL: 4.57 cm/s
LVOT area: 2.84 cm2
LVOT diameter: 19 mm
Lateral S' vel: 13.8 cm/s
MV Dec: 254
MV pk E vel: 0.7 m/s
RV sys press: 59 mmHg
Reg peak vel: 369 cm/s
TAPSE: 18 mm
TDI e' lateral: 4.57
TDI e' medial: 3.92
TR max vel: 369 cm/s
Weight: 2545.6 [oz_av]

## 2016-03-10 LAB — GLUCOSE, CAPILLARY
GLUCOSE-CAPILLARY: 148 mg/dL — AB (ref 65–99)
GLUCOSE-CAPILLARY: 160 mg/dL — AB (ref 65–99)
Glucose-Capillary: 104 mg/dL — ABNORMAL HIGH (ref 65–99)
Glucose-Capillary: 144 mg/dL — ABNORMAL HIGH (ref 65–99)
Glucose-Capillary: 150 mg/dL — ABNORMAL HIGH (ref 65–99)

## 2016-03-10 LAB — BLOOD GAS, ARTERIAL
Acid-base deficit: 4.5 mmol/L — ABNORMAL HIGH (ref 0.0–2.0)
Bicarbonate: 20.5 meq/L (ref 20.0–24.0)
Delivery systems: POSITIVE
Drawn by: 257081
Expiratory PAP: 8
FIO2: 100
Inspiratory PAP: 16
Mode: POSITIVE
O2 Saturation: 99.7 %
Patient temperature: 99.7
TCO2: 21.7 mmol/L (ref 0–100)
pCO2 arterial: 41.9 mmHg (ref 35.0–45.0)
pH, Arterial: 7.314 — ABNORMAL LOW (ref 7.350–7.450)
pO2, Arterial: 325 mmHg — ABNORMAL HIGH (ref 80.0–100.0)

## 2016-03-10 LAB — BASIC METABOLIC PANEL
Anion gap: 8 (ref 5–15)
BUN: 31 mg/dL — AB (ref 6–20)
CHLORIDE: 109 mmol/L (ref 101–111)
CO2: 23 mmol/L (ref 22–32)
Calcium: 8.5 mg/dL — ABNORMAL LOW (ref 8.9–10.3)
Creatinine, Ser: 2.71 mg/dL — ABNORMAL HIGH (ref 0.44–1.00)
GFR calc Af Amer: 17 mL/min — ABNORMAL LOW (ref >60)
GFR calc non Af Amer: 15 mL/min — ABNORMAL LOW (ref >60)
GLUCOSE: 153 mg/dL — AB (ref 65–99)
POTASSIUM: 5.1 mmol/L (ref 3.5–5.1)
SODIUM: 140 mmol/L (ref 135–145)

## 2016-03-10 LAB — MRSA PCR SCREENING: MRSA by PCR: POSITIVE — AB

## 2016-03-10 LAB — TROPONIN I
TROPONIN I: 0.09 ng/mL — AB (ref <0.03)
Troponin I: 0.03 ng/mL

## 2016-03-10 MED ORDER — INSULIN ASPART 100 UNIT/ML ~~LOC~~ SOLN
0.0000 [IU] | Freq: Three times a day (TID) | SUBCUTANEOUS | Status: DC
  Administered 2016-03-10 – 2016-03-11 (×2): 1 [IU] via SUBCUTANEOUS
  Administered 2016-03-11: 3 [IU] via SUBCUTANEOUS
  Administered 2016-03-12 (×2): 2 [IU] via SUBCUTANEOUS
  Administered 2016-03-12: 1 [IU] via SUBCUTANEOUS
  Administered 2016-03-13: 2 [IU] via SUBCUTANEOUS

## 2016-03-10 MED ORDER — CHLORHEXIDINE GLUCONATE CLOTH 2 % EX PADS
6.0000 | MEDICATED_PAD | Freq: Every day | CUTANEOUS | Status: DC
  Administered 2016-03-11 – 2016-03-13 (×3): 6 via TOPICAL

## 2016-03-10 MED ORDER — FUROSEMIDE 10 MG/ML IJ SOLN
40.0000 mg | Freq: Once | INTRAMUSCULAR | Status: AC
  Administered 2016-03-10: 40 mg via INTRAVENOUS
  Filled 2016-03-10: qty 4

## 2016-03-10 MED ORDER — AMLODIPINE BESYLATE 5 MG PO TABS
5.0000 mg | ORAL_TABLET | Freq: Every day | ORAL | Status: DC
  Administered 2016-03-10 – 2016-03-11 (×2): 5 mg via ORAL
  Filled 2016-03-10 (×2): qty 1

## 2016-03-10 MED ORDER — IPRATROPIUM-ALBUTEROL 0.5-2.5 (3) MG/3ML IN SOLN
3.0000 mL | Freq: Three times a day (TID) | RESPIRATORY_TRACT | Status: DC
  Administered 2016-03-10 – 2016-03-13 (×11): 3 mL via RESPIRATORY_TRACT
  Filled 2016-03-10 (×11): qty 3

## 2016-03-10 MED ORDER — MUPIROCIN 2 % EX OINT
1.0000 "application " | TOPICAL_OINTMENT | Freq: Two times a day (BID) | CUTANEOUS | Status: DC
  Administered 2016-03-10 – 2016-03-13 (×7): 1 via NASAL
  Filled 2016-03-10 (×2): qty 22

## 2016-03-10 MED ORDER — INSULIN ASPART 100 UNIT/ML ~~LOC~~ SOLN: 0.0000 [IU] | Freq: Every day | SUBCUTANEOUS | Status: DC

## 2016-03-10 NOTE — Progress Notes (Signed)
Patient transferred to [facility] 04 accompanied by RB, RT, and Rapid response nurse with  Bipab. Report given prior to transfer.

## 2016-03-10 NOTE — Progress Notes (Signed)
PT Cancellation Note  Patient Details Name: [name] MRN: [id number] DOB: February 24, 1929   Cancelled Treatment:    Reason Eval/Treat Not Completed: Medical issues which prohibited therapy (rapid response called).  Will await a new order for PT to continue.   Ramond Dial 03/10/2016, 12:12 PM    Mee Hives, PT MS Acute Rehab Dept. Number: [place] and [place]

## 2016-03-10 NOTE — Progress Notes (Signed)
CBG down to 49 last night. CBG = 82 upon reassessment. Pt received only 1 dose of Levemir since admission. Levemir held for 2 nights in a row.  Will continue to monitor.

## 2016-03-10 NOTE — Progress Notes (Signed)
  Echocardiogram 2D Echocardiogram started in echo lab, but patient started deteriorating, so rapid response was called.  Will try and finish echo later today.  Johny Chess 03/10/2016, 10:37 AM

## 2016-03-10 NOTE — Significant Event (Signed)
Rapid Response Event Note  Overview: Time Called: 1013 Arrival Time: 1015 Event Type: Respiratory  Initial Focused Assessment: Patient in Echo Lab when she became acutely SOB. Upon my arrival patient with crackles up to her collar bone, using accessory muscles, labored breathing. Frothy sputum.   150/103,  HR 118  RR 28  Interventions: Orally suctioned, repositioned in the bed Placed on [place] Notified floor RN of change in patient status, will notify MD RT at bedside with NRB.  Placed on NRB  Transported to 3E27 for further care AM dose of 40mg  Lasix given IV upon arrival. MD at bedside.   Placed patient on Bipap Patient much improved Crackles in lower lobes ABG and PCXR done Transferred to [place] (if not transferred):  Event Summary: Name of Physician Notified: Intermedicine Teaching Service at 1025    at    Outcome: Transferred (Comment)  Event End Time: Page  Raliegh Ip

## 2016-03-10 NOTE — Progress Notes (Signed)
RT called to Rapid Response in Echo. Patient appeared to be in flash pulmonary edema. Placed on NRB and transported upstairs. Placed on BiPAP 16/8 and 100%. Appears to be a bit more comfortable on that. Will be transferred to [facility]. RT to monitor as needed

## 2016-03-10 NOTE — Plan of Care (Signed)
Patient is active with [facility] confirmed with [facility] staff. Patient will be followed by IM teaching service, IM has accepted patient to their service. Attending MD Dr Dareen Piano.    Rocsi Hazelbaker M.D. [facility] 03/10/2016, 9:44 AM  Pager: [phone]

## 2016-03-10 NOTE — Progress Notes (Signed)
  Echocardiogram 2D Echocardiogram has been performed.  [name] 03/10/2016, 4:21 PM

## 2016-03-10 NOTE — Progress Notes (Signed)
Patient having acute respiratory distress . MD and Rapid response nurse, Charge nurse  and Respiratory therapist at bedside. Patient will be transferring to [place] for Bipab. Patient son Margart Sickles notified.

## 2016-03-10 NOTE — Progress Notes (Signed)
Pt weaned to room air. O2 sat = 93% and HR = 71 bpm. Will continue to monitor.

## 2016-03-10 NOTE — Progress Notes (Signed)
Transfer Note  Subjective:   [name] is an 82 year old woman former smoker with dementia, CAD s/p CABG in 2000 and PCI, DM2, HTN, HLD, CKD4, chronic anemia, PVD, recently admitted for CHF exacerbation 7/15-7/20 presented with dyspnea to the ED on 03/08/2016 found to be in acute on chronic CHF exacerbation. There was also reported of leg pain and orthopnea. No cough.  She presented on 7/15 with dyspnea for a few days. She was initially placed on BiPAP and placed on Lasix 40mg  IV every 12 hours. Her Lasix was held on 7/19 because of worsening renal function. Her creatinine was 3.18 prior to discharge. CXR at that time also was concerning for a right base hazy airspace density that may represent superimposed pneumonia. There was no leukocytosis, fever, or cough reported per EMR review. She was started on IV ceftriaxone and azithromycin which was transitioned to Ceftin to complete 5 days of antibiotics.  In the ED, K was 5.8, CO2, 17, BUN 33, and Cr 2.45. CXR demonstrated vascular congestion and pulmonary edema, and small bilateral effusions. Troponin POC was 0.02. EKG had normal sinus rhythm, unchanged from previous EKG. She was on supplemental oxygen. She was given IV Lasix 40mg . She was admitted for further diuresis.  She quit tobacco over 40 years ago and was previously 1PPD for 20 years. She had heavy alcohol use and quit over 30 years ago. Denies drugs. She lives at home with her daughter and is active with [facility].  Family history significant for CAD in father.  This morning while she was having an echo done, she developed acute respiratory distress. She is dyspneic. She denies chest pain or abdominal pain. No headache or dizziness. No difficulties with urination or dysuria. Occasional subjective fevers and chills.  Objective:  Vital signs in last 24 hours: Vitals:   03/10/16 0124 03/10/16 0437 03/10/16 0438 03/10/16 0700  BP:   (!) 173/53   Pulse: 71  73   Resp:   18     Temp:   99.7 F (37.6 C)   TempSrc:   Oral   SpO2: 93%  93% 94%  Weight:  72.2 kg (159 lb 1.6 oz)    Height:       General Apperance: Moderate respiratory distress HEENT: Normocephalic, atraumatic, anicteric sclera Neck: Supple, trachea midline, +JVD Lungs: Crackles bilaterally. No wheezes.  Heart: Tachycardic rate and regular rhythm Abdomen: Soft, nontender, nondistended, no rebound/guarding Extremities: Warm and well perfused, no edema Skin: No rashes or lesions Neurologic: Alert and interactive. No gross deficits.  Assessment/Plan: 82 year old woman former smoker with dementia, CAD s/p CABG in 2000 and PCI, DM2, HTN, HLD, CKD4, chronic anemia, PVD, recently admitted for CHF exacerbation 7/15-7/20 presented with dyspnea to the ED on 03/08/2016 found to be in acute on chronic CHF exacerbation.  Acute on chronic diastolic heart failure: Last echo 11/19/2014 with LV EF 65-70% and no comment on diastolic dysfunction. 1L UOP with 3 unrecorded urine occurences. Weight down to 72.2kg from 74.4kg yesterday. Acute respiratory distress during her echo today. ABG unremarkable. Repeat CXR this morning with interval improvement in CHF and edema. Appeared to improve after receiving Lasix and placing on BiPAP. No hypoxia and tachycardia seems to have resolved - PE is low on the differential.  -Repeat echo pending -Continue IV Lasix -Strict intake and output and daily weights -Continue carvedilol 2.125mg  BID.  -Continue BiPAP as tolerated -Start amlodipine 5mg  daily to improve blood pressure control  -Repeat troponin. Troponin was 0.03  at this morning.  CAD: Previously had CABG and PCI. EKG on 8/16 at 4AM with new lack of R wave progression, concerning for lateral infarct. Repeat at 2000 with no ST changes. -Continue ASA 81mg  daily and Plavix 75mg  daily -Continue atorvastatin daily  Stage 4 CKD: Baseline cr around 2.4. Creatinine this morning 2.71.  -Change to renal diet given  hyperkalemia  Diabetes Mellitus Type 2: BG between 104 and 150.  -Continue CBG monitoring and SSI. -Hold Levemir 25 units   Hyperkalemia: Resolved this morning -Continue to monitor  Chronic normocytic anemia: Likely anemia of chronic disease. Hgb stable at baseline of 10.  Hypertension: -Continue carvedilol -Add amlodipine 5mg  daily  GERD: continue famotidine 20mg  daily  Dementia, depression: Continue home citalopram 5mg  daily and Namenda BID  VTE ppx: Subq Lovenox Code status: FULL  Dispo: Anticipated discharge in approximately 1-2 day(s).   Nichole Loll, MD 03/10/2016, 9:45 AM Pager: [phone]

## 2016-03-10 NOTE — Progress Notes (Signed)
Foley catheter inserted in patient, per MD order and appropriate indication.  Foley huddle completed and 2 RN's present during insertion.  Due to patient's anatomy, 3 attempts, each with brand new catheter kits, were needed to correctly insert catheter.  Patient tolerated insertion well.

## 2016-03-10 NOTE — Care Management Note (Signed)
Case Management Note  Patient Details  Name: [name] MRN: [id number] Date of Birth: 1928-10-08  Subjective/Objective:     Admitted with CHF        Action/Plan: Patient is active with [facility] [phone]) and all inclusive program for the care of the elderly; The Med Director is Dr Barney Drain; she lives at home with her daughter; She receives all of there therapies at [facility] and they provide her medication.  Expected Discharge Date:    possibly 03/13/2016              Expected Discharge Plan:  Home/Self Care  Discharge planning Services  CM Consult   Choice offered to:  NA  Status of Service:  In process, will continue to follow  [name] [id number] 03/10/2016, 10:52 AM

## 2016-03-11 DIAGNOSIS — R0600 Dyspnea, unspecified: Secondary | ICD-10-CM

## 2016-03-11 LAB — GLUCOSE, CAPILLARY
GLUCOSE-CAPILLARY: 120 mg/dL — AB (ref 65–99)
GLUCOSE-CAPILLARY: 210 mg/dL — AB (ref 65–99)
Glucose-Capillary: 142 mg/dL — ABNORMAL HIGH (ref 65–99)
Glucose-Capillary: 136 mg/dL — ABNORMAL HIGH (ref 65–99)

## 2016-03-11 LAB — BASIC METABOLIC PANEL
ANION GAP: 8 (ref 5–15)
BUN: 29 mg/dL — AB (ref 6–20)
CHLORIDE: 107 mmol/L (ref 101–111)
CO2: 25 mmol/L (ref 22–32)
CREATININE: 2.7 mg/dL — AB (ref 0.44–1.00)
Calcium: 8.4 mg/dL — ABNORMAL LOW (ref 8.9–10.3)
GFR calc non Af Amer: 15 mL/min — ABNORMAL LOW (ref >60)
GFR, EST AFRICAN AMERICAN: 17 mL/min — AB (ref >60)
Glucose, Bld: 155 mg/dL — ABNORMAL HIGH (ref 65–99)
Potassium: 4.6 mmol/L (ref 3.5–5.1)
SODIUM: 140 mmol/L (ref 135–145)

## 2016-03-11 LAB — TROPONIN I: Troponin I: 0.07 ng/mL (ref <0.03)

## 2016-03-11 LAB — MAGNESIUM: Magnesium: 2.2 mg/dL (ref 1.7–2.4)

## 2016-03-11 MED ORDER — AMLODIPINE BESYLATE 10 MG PO TABS
10.0000 mg | ORAL_TABLET | Freq: Every day | ORAL | Status: DC
  Administered 2016-03-11 – 2016-03-13 (×3): 10 mg via ORAL
  Filled 2016-03-11 (×3): qty 1

## 2016-03-11 MED ORDER — FUROSEMIDE 40 MG PO TABS
40.0000 mg | ORAL_TABLET | Freq: Every day | ORAL | Status: DC
  Administered 2016-03-12 – 2016-03-13 (×2): 40 mg via ORAL
  Filled 2016-03-11 (×2): qty 1

## 2016-03-11 NOTE — Progress Notes (Signed)
Subjective:  No acute events overnight. Patient continues to be SOB, but feeling much better this morning. No complaints this AM. Weaned off of BiPAP. On 2L O2 [place] and looked comfortable.    Objective:  Vital signs in last 24 hours: Vitals:   03/11/16 0800 03/11/16 0801 03/11/16 1000 03/11/16 1325  BP: (!) 175/57  (!) 160/91   Pulse: 60  68   Resp: (!) 24  12   Temp:      TempSrc:      SpO2: 100% 100% 92% 96%  Weight:      Height:       Physical Exam: General: laying in bed,comfortable, in NAD  CV: RRR, no m/r/g Chest: CTAB, no wheezes or crackles, no increased work of breathing  Abd: soft, NTND, no rebound or guarding  Extremities: warm and well perfused, no cyanosis or edema  Neuro: alert, unable to recall her age   Labs/Studies:    Ref. Range 03/11/2016 02:29  Sodium Latest Ref Range: 135 - 145 mmol/L 140  Potassium Latest Ref Range: 3.5 - 5.1 mmol/L 4.6  Chloride Latest Ref Range: 101 - 111 mmol/L 107  CO2 Latest Ref Range: 22 - 32 mmol/L 25  BUN Latest Ref Range: 6 - 20 mg/dL 29 (H)  Creatinine Latest Ref Range: 0.44 - 1.00 mg/dL 2.70 (H)  Calcium Latest Ref Range: 8.9 - 10.3 mg/dL 8.4 (L)  EGFR (Non-African Amer.) Latest Ref Range: >60 mL/min 15 (L)  EGFR (African American) Latest Ref Range: >60 mL/min 17 (L)  Glucose Latest Ref Range: 65 - 99 mg/dL 155 (H)  Anion gap Latest Ref Range: 5 - 15  8  Magnesium Latest Ref Range: 1.7 - 2.4 mg/dL 2.2    Assessment/Plan:  Principal Problem:   CHF (congestive heart failure) (HCC) Active Problems:   Hyperlipidemia   Essential hypertension   Dementia   CKD (chronic kidney disease) stage 4, GFR 15-29 ml/min (HCC)   Hyperkalemia   Acute on chronic diastolic congestive heart failure (HCC)   Dyspnea  Ms. Mars is a 82yo F with history of HFpEF EF 55-60% who presented with acute on chronic HF exacerbation and had an acute worsening in respiratory status requiring BiPAP support. Her blood pressure has been high during  this admission. It is possible that the increase in  afterload from hypertensive emergency might have caused flash pulmonary edema causing acute hypoxic respiratory failure. We increased her amlodipine today for better management of HTN. Will add another antihypertensive agent tomorrow if no improvement in BP seen as this is likely the cause of her respiratory failure. She is now off of BiPAP and satting well on 2L O2 by [place]. She is stable to transfer out of stepdown unit.   1. Acute hypoxic respiratory failure: pulmonary edema 2/2 hypertensive emergency in the setting of chronic HFpEF - Satting well on 2L [place]  - Transfer to floor  - TTE showed 55-60%, and grade I diastolic dysfunction  - PT consult placed   2. Chronic HFpEF: TTE 02/2016 EF 20-35%, grade I diastolic dysfunction  - s/p IV Lasix 40 x2 yesterday with adequate UOP  - IV Lasix 40 x1 today  - On carvedilol 3.125 BID  - Increase amlodipine 5--> 40m today  - Continue home atorvastatin 20 - Continue home plavix 75  3. HTN: home antihypertensives held on admission  - Increase amlodipine 5--> 10 mg - Will consider adding another agent if no improvement in BP tomorrow   4. CAD: s/p  CABG and PCI  - Continue home ASA and plavix as above - Continue statin as above   5. T2DM: Last A1c 8.8 on 01/2016 - SSI  - Holding home levemir  - Carb modified diet   6. GERD:  - Continue famotidine 20  7. HLD:  - Continue home statin as above   8. Depression:  - Continue home celexa 5  9. Dementia:  - Continue home namenda    Dispo: Anticipated discharge in approximately 1-2 day(s).   LOS: 2 days   Welford Roche, Medical Student 03/11/2016, 1:39 PM Pager: [phone]

## 2016-03-11 NOTE — Progress Notes (Signed)
   Subjective: No acute events overnight. Patient not complaining of chest pain this morning. Patient continues to have mild shortness of breath. She was weaned off of oxygen and is now on room air. She has no additional acute complaints or concerns this morning.  Objective:  Vital signs in last 24 hours: Vitals:   03/11/16 0700 03/11/16 0800 03/11/16 0801 03/11/16 1000  BP:  (!) 175/57  (!) 160/91  Pulse: 61 60  68  Resp: 13 (!) 24  12  Temp:      TempSrc:      SpO2: 98% 100% 100% 92%  Weight:      Height:       Physical Exam  Constitutional: She is oriented to person, place, and time. She appears well-developed and well-nourished.  In no acute distress  HENT:  Head: Normocephalic and atraumatic.  Cardiovascular: Normal rate and regular rhythm.  Exam reveals no gallop and no friction rub.   No murmur heard. Respiratory: Effort normal and breath sounds normal. No respiratory distress. She has no wheezes.  GI: Soft. Bowel sounds are normal. She exhibits no distension. There is no tenderness.  Musculoskeletal: She exhibits no edema.  Neurological: She is alert and oriented to person, place, and time.     Assessment/Plan: [name] is an 82 year old female with a past medical history of diabetes mellitus, hypertension, hyperlipidemia, chronic kidney disease and recent admission for congestive heart failure exacerbation who presented with acute hypoxic respiratory failure secondary to flash pulmonary edema due to hypertensive emergency.  1. Acute hypoxic respiratory failure secondary to flash pulmonary edema, Resolved -- Patient became acutely short of breath following an echocardiogram procedure requiring BiPAP. Patient was weaned from BiPAP to oxygen via nasal cannula overnight. She was weaned off of oxygen by nasal cannula this morning and is currently on room air. Additionally, the patient had a chest x-ray which demonstrated pulmonary edema and she was diuresed with furosemide  40 mg IV 2. Echocardiogram on 03/11/2016 without significant change from previous. -- Shortness of breath resolved this morning -- Move to regular floor  2. Hypertension Patient has a history of hypertension. Her acute hypoxic respiratory failure secondary to flash pulmonary edema may be secondary to hypertensive emergency. Her most recent blood pressure is 160/91. As we do not currently think the patient is having a CHF exacerbation we will continue her on her home antihypertensive medications including her beta blocker. -- Amlodipine 10 mg once daily -- Carvedilol 3.125 mg oral 2 times daily -- Continue atorvastatin 20 mg once daily -- Continue clopidogrel 75 mg once daily  3. Diabetes mellitus Patient has a history of diabetes for most recent blood glucose of 210. -- Carbohydrate modified diet -- Insulin aspart inject 0-5 units daily at bedtime based on capillary blood glucose level -- Insulin aspart inject 0-9 units 3 times daily with meals based on capillary blood glucose level  4. DVT/PE Prophylaxis  -- Enoxaparin 30 mg subcutaneous injection once daily  Dispo: Anticipated discharge in approximately 1-2 day(s).   Nichole Shoulder, MD 03/11/2016, 12:55 PM Pager: [phone]

## 2016-03-12 DIAGNOSIS — J9601 Acute respiratory failure with hypoxia: Secondary | ICD-10-CM

## 2016-03-12 DIAGNOSIS — F329 Major depressive disorder, single episode, unspecified: Secondary | ICD-10-CM

## 2016-03-12 DIAGNOSIS — I130 Hypertensive heart and chronic kidney disease with heart failure and stage 1 through stage 4 chronic kidney disease, or unspecified chronic kidney disease: Principal | ICD-10-CM

## 2016-03-12 DIAGNOSIS — J9691 Respiratory failure, unspecified with hypoxia: Secondary | ICD-10-CM

## 2016-03-12 DIAGNOSIS — E118 Type 2 diabetes mellitus with unspecified complications: Secondary | ICD-10-CM

## 2016-03-12 DIAGNOSIS — K219 Gastro-esophageal reflux disease without esophagitis: Secondary | ICD-10-CM

## 2016-03-12 LAB — BASIC METABOLIC PANEL
Anion gap: 8 (ref 5–15)
BUN: 34 mg/dL — AB (ref 6–20)
CHLORIDE: 106 mmol/L (ref 101–111)
CO2: 25 mmol/L (ref 22–32)
CREATININE: 2.65 mg/dL — AB (ref 0.44–1.00)
Calcium: 8.5 mg/dL — ABNORMAL LOW (ref 8.9–10.3)
GFR calc Af Amer: 18 mL/min — ABNORMAL LOW (ref >60)
GFR, EST NON AFRICAN AMERICAN: 15 mL/min — AB (ref >60)
GLUCOSE: 134 mg/dL — AB (ref 65–99)
Potassium: 4.3 mmol/L (ref 3.5–5.1)
SODIUM: 139 mmol/L (ref 135–145)

## 2016-03-12 LAB — GLUCOSE, CAPILLARY
GLUCOSE-CAPILLARY: 141 mg/dL — AB (ref 65–99)
GLUCOSE-CAPILLARY: 182 mg/dL — AB (ref 65–99)
GLUCOSE-CAPILLARY: 156 mg/dL — AB (ref 65–99)
Glucose-Capillary: 197 mg/dL — ABNORMAL HIGH (ref 65–99)

## 2016-03-12 MED ORDER — FUROSEMIDE 40 MG PO TABS: 40.0000 mg | ORAL_TABLET | Freq: Every day | ORAL | 2 refills | Status: DC

## 2016-03-12 MED ORDER — CARVEDILOL 3.125 MG PO TABS: 3.1250 mg | ORAL_TABLET | Freq: Two times a day (BID) | ORAL | 0 refills | Status: AC

## 2016-03-12 MED ORDER — CARVEDILOL 3.125 MG PO TABS: 3.1250 mg | ORAL_TABLET | Freq: Two times a day (BID) | ORAL | 0 refills | Status: DC

## 2016-03-12 NOTE — Evaluation (Signed)
Physical Therapy Evaluation Patient Details Name: [name] MRN: [id number] DOB: 1929-07-13 Today's Date: 03/12/2016   History of Present Illness  Patient is an 82 yo female admitted 03/08/16 with SOB, recurrent CHF exacerbation.   PMH:  recent admit for CHF, DM, HTN, HLD, CKD, CAD, MI, dementia, PVD  Clinical Impression  Patient presents with decreased strength and balance impacting functional mobility.  Patient at baseline with mobility and gait.  Ready for d/c from PT perspective.  Patient to continue f/u PT at [facility] program.    Follow Up Recommendations Supervision for mobility/OOB;Other (comment) (Will have PT at [facility] program)    Equipment Recommendations  None recommended by PT    Recommendations for Other Services       Precautions / Restrictions Precautions Precautions: Fall Restrictions Weight Bearing Restrictions: No      Mobility  Bed Mobility Overal bed mobility: Modified Independent             General bed mobility comments: Increased time.  No physical assist needed.  Transfers Overall transfer level: Needs assistance Equipment used: Rolling walker (2 wheeled) Transfers: Sit to/from Stand Sit to Stand: Supervision         General transfer comment: Patient uses correct hand placement.  Supervision for safety.  Ambulation/Gait Ambulation/Gait assistance: Supervision Ambulation Distance (Feet): 180 Feet Assistive device: Rolling walker (2 wheeled) Gait Pattern/deviations: Step-through pattern;Decreased stride length Gait velocity: decreased Gait velocity interpretation: Below normal speed for age/gender General Gait Details: Patient demonstrates safe use of RW.  No loss of balance with RW.  Supervision for safety only.  Stairs            Wheelchair Mobility    Modified Rankin (Stroke Patients Only)       Balance Overall balance assessment: Needs assistance Sitting-balance support: No upper extremity supported;Feet supported Sitting  balance-Leahy Scale: Normal     Standing balance support: Single extremity supported Standing balance-Leahy Scale: Poor Standing balance comment: UE support for balance.                             Pertinent Vitals/Pain Pain Assessment: No/denies pain    Home Living Family/patient expects to be discharged to:: Private residence Living Arrangements: Children Available Help at Discharge: Family;Personal care attendant;Available 24 hours/day Type of Home: Apartment Home Access: Level entry     Home Layout: One level Home Equipment: Walker - 2 wheels;Cane - single point;Bedside commode;Walker - 4 wheels Additional Comments: [facility]  Receives PT there.    Prior Function Level of Independence: Independent with assistive device(s);Needs assistance   Gait / Transfers Assistance Needed: Patient uses rollator for gait  ADL's / Homemaking Assistance Needed: Per daughter, patient receives assist for bathing, dressing, meal prep, and housekeeping from daughter and Aide.        Hand Dominance   Dominant Hand: Right    Extremity/Trunk Assessment   Upper Extremity Assessment: Overall WFL for tasks assessed           Lower Extremity Assessment: Generalized weakness         Communication   Communication: No difficulties  Cognition Arousal/Alertness: Awake/alert Behavior During Therapy: WFL for tasks assessed/performed Overall Cognitive Status: History of cognitive impairments - at baseline                      General Comments General comments (skin integrity, edema, etc.): At end of session, pataient with  SBP of 180 - RN notified.    Exercises        Assessment/Plan    PT Assessment Patient needs continued PT services;All further PT needs can be met in the next venue of care  PT Diagnosis Abnormality of gait;Generalized weakness   PT Problem List Decreased strength;Decreased balance;Decreased  mobility;Decreased cognition;Cardiopulmonary status limiting activity  PT Treatment Interventions  (Patient to receive f/u PT at [facility] program)   PT Goals (Current goals can be found in the Care Plan section) Acute Rehab PT Goals PT Goal Formulation: All assessment and education complete, DC therapy (To have f/u PT at [facility] program)    Frequency     Barriers to discharge        Co-evaluation               End of Session Equipment Utilized During Treatment: Gait belt Activity Tolerance: Patient tolerated treatment well Patient left: in bed;with call bell/phone within reach;with family/visitor present Nurse Communication: Mobility status (Ready for d/c from PT perspective; SBP of 180)         Time: 1119-1140 PT Time Calculation (min) (ACUTE ONLY): 21 min   Charges:   PT Evaluation $PT Eval Moderate Complexity: 1 Procedure     PT G CodesDespina Pole 2016-03-14, 11:53 AM [name]. [name], [place] Pager [phone]

## 2016-03-12 NOTE — Progress Notes (Signed)
Subjective:  No acute events overnight. Patient feeling much better today. No complaints this morning. Denied chest pain and SOB. States she is ready to go home.   Objective:  Vital signs in last 24 hours: Vitals:   03/11/16 2000 03/11/16 2349 03/12/16 0400 03/12/16 0700  BP: 139/70  (!) 132/42   Pulse: (!) 57   68  Resp: 15  16 17   Temp: 98.8 F (37.1 C) 99.1 F (37.3 C) 98.9 F (37.2 C)   TempSrc: Oral Oral Oral   SpO2: 96%   96%  Weight:   67 kg (147 lb 11.3 oz)   Height:       Physical Exam: General: laying flat in bed,comfortable, in NAD  CV: RRR, no m/r/g Chest: CTAB, no wheezes or crackles, no increased work of breathing  Abd: soft, NTND, no rebound or guarding  Extremities: warm and well perfused, no cyanosis or edema  Neuro: A&Ox2, no focal deficits   Labs/Studies:    Ref. Range 03/12/2016 02:41  Sodium Latest Ref Range: 135 - 145 mmol/L 139  Potassium Latest Ref Range: 3.5 - 5.1 mmol/L 4.3  Chloride Latest Ref Range: 101 - 111 mmol/L 106  CO2 Latest Ref Range: 22 - 32 mmol/L 25  BUN Latest Ref Range: 6 - 20 mg/dL 34 (H)  Creatinine Latest Ref Range: 0.44 - 1.00 mg/dL 2.65 (H)  Calcium Latest Ref Range: 8.9 - 10.3 mg/dL 8.5 (L)  EGFR (Non-African Amer.) Latest Ref Range: >60 mL/min 15 (L)  EGFR (African American) Latest Ref Range: >60 mL/min 18 (L)  Glucose Latest Ref Range: 65 - 99 mg/dL 134 (H)  Anion gap Latest Ref Range: 5 - 15  8    Assessment/Plan:  Principal Problem:   CHF (congestive heart failure) (HCC) Active Problems:   Hyperlipidemia   Essential hypertension   Dementia   CKD (chronic kidney disease) stage 4, GFR 15-29 ml/min (HCC)   Hyperkalemia   Acute on chronic diastolic congestive heart failure (HCC)   Dyspnea  [name] is a 82yo F with history of HFpEF EF 55-60% who presented with acute on chronic HF exacerbation and had an acute worsening in respiratory status requiring BiPAP support. Her home antihypertensives were held on  admission and her blood pressure has been elevated. It is possible that the increase in  afterload from hypertensive emergency might have caused flash pulmonary edema causing acute hypoxic respiratory failure. She is currently on amlodipine 10 and continues to be hypertensive to 180s. Will continue to monitor her BP today and consider adding another antihypertensive agent if no improvement in BP seen as this is likely the cause of her acute respiratory failure. She is now on room air and satting well. She could potentially be discharged today after PT evaluation.   1. Acute hypoxic respiratory failure: pulmonary edema 2/2 hypertensive emergency in the setting of chronic HFpEF - TTE showed 55-60%, and grade I diastolic dysfunction  - Satting well on room air  - PT evaluation   2. Chronic HFpEF: TTE 02/2016 EF 28-20%, grade I diastolic dysfunction  - s/p IV Lasix 40 x1 yesterday with adequate UOP  - Transition to PO Lasix today  - Continue carvedilol 3.125 BID  - Continue amlodipine 62m today  - Continue home atorvastatin 20  3. HTN: home antihypertensives held on admission.Patient continues to be hypertensive to sBP 180s.  - Continue amlodipine as above  - Will consider adding another agent if no improvement in BP   4. CAD:  s/p CABG and PCI  - Continue home ASA and plavix as - Continue statin as above   5. T2DM: Last A1c 8.8 on 01/2016 - SSI  - Holding home levemir  - Carb modified diet   6. GERD:  - Continue famotidine 20  7. HLD:  - Continue home statin as above   8. Depression:  - Continue home celexa 5  9. Dementia:  - Continue home namenda    Dispo: Anticipated discharge today or tomorrow pending PT evaluation.    LOS: 3 days   Welford Roche, Medical Student 03/12/2016, 8:40 AM Pager: [phone]

## 2016-03-12 NOTE — Progress Notes (Signed)
   Subjective: No acute events overnight. Denies any SOB or chest pain. Ate breakfast, no n/v. No complaints today.   Objective:  Vital signs in last 24 hours: Vitals:   03/12/16 0400 03/12/16 0700 03/12/16 0849 03/12/16 0941  BP: (!) 132/42   (!) 179/69  Pulse:  68  65  Resp: 16 17    Temp: 98.9 F (37.2 C)   98.6 F (37 C)  TempSrc: Oral   Oral  SpO2:  96% 100%   Weight: 147 lb 11.3 oz (67 kg)     Height:       Physical Exam  Constitutional: She is oriented to person, place, and time. She appears well-developed and well-nourished.  In no acute distress  HENT:  Head: Normocephalic and atraumatic.  Cardiovascular: Normal rate and regular rhythm.  Exam reveals no gallop and no friction rub.   No murmur heard. Respiratory: Effort normal and breath sounds normal. No respiratory distress. She has no wheezes.  GI: Soft. Bowel sounds are normal. She exhibits no distension. There is no tenderness.  Musculoskeletal: She exhibits no edema.  Neurological: She is alert and oriented to person, place, and time.     Assessment/Plan: Mrs. Laisure is an 82 year old female with a past medical history of diabetes mellitus, hypertension, hyperlipidemia, chronic kidney disease and recent admission for congestive heart failure exacerbation who presented with acute hypoxic respiratory failure secondary to flash pulmonary edema due to hypertensive emergency.  1. Acute hypoxic respiratory failure secondary to flash pulmonary edema, Resolved -- Patient became acutely short of breath following an echocardiogram procedure requiring BiPAP. BP meds were held on admission for unclear reason and BP. She was diuresed with IV lasix and her BP was controlled after restarting her home BP meds. Patient was weaned from [place] and has been doing well on room air for last 2 days.  No further episodes of dyspnea - cont PO lasix, will discharge her on lasix 40mg  daily. - cont to treat BP under goal <140/90  2.  Hypertension Patient has a history of hypertension. Her acute hypoxic respiratory failure secondary to flash pulmonary edema may be secondary to hypertensive emergency.  BP higher than goal today in 170's. However she just received her home meds.  Will cont her home meds for now. HR will not allow going up on the coreg.  -- Amlodipine 10 mg once daily -- Carvedilol 3.125 mg oral 2 times daily -- Continue atorvastatin 20 mg once daily -- Continue clopidogrel 75 mg once daily  3. Diabetes mellitus Patient has a history of diabetes, gluocose has been in low 100-mid 100's.  - SSI.   4. DVT/PE Prophylaxis  -- Enoxaparin 30 mg subcutaneous injection once daily  Dispo: Anticipated discharge likely today pending PT eval.  Dellia Nims, MD 03/12/2016, 10:28 AM Pager: [phone]

## 2016-03-12 NOTE — Progress Notes (Signed)
Pt unable to void.  Bladder scan 250-275.  md notified.  Will continue to  [place], Bethena Roys T

## 2016-03-12 NOTE — Progress Notes (Signed)
md notified of pt unable to void.  New orders received.  Will keep pt here until am and voiding.  Will do I&o cath at 2300 and again at 0500.  Will continue to monitor.[name]

## 2016-03-12 NOTE — Discharge Summary (Signed)
Name: [name] MRN: [id number] DOB: 08-13-1928 82 y.o. PCP: Rowe Clack, MD  Date of Admission: 03/08/2016 11:27 PM Date of Discharge: 03/12/2016 Attending Physician: Aldine Contes, MD  Discharge Diagnosis: 1. Acute on Chronic HFpEF 2. Acute hypoxic respiratory failure  3. Hypertension  4. CAD 5. CKD Stage 4 6. T2DM 7. HLD 8. GERD 9. Depression  10. Dementia   Principal Problem:   CHF (congestive heart failure) (HCC) Active Problems:   Hyperlipidemia   Essential hypertension   Dementia   CKD (chronic kidney disease) stage 4, GFR 15-29 ml/min (HCC)   Hyperkalemia   Acute on chronic diastolic congestive heart failure (HCC)   Dyspnea   Discharge Medications:   Medication List    STOP taking these medications   IMODIUM A-D 2 MG tablet Generic drug:  loperamide     TAKE these medications   acetaminophen 325 MG tablet Commonly known as:  TYLENOL Take 650 mg by mouth daily as needed (back pain).   amLODipine 10 MG tablet Commonly known as:  NORVASC Take 10 mg by mouth daily.   aspirin 81 MG tablet Take 1 tablet (81 mg total) by mouth daily.   atorvastatin 20 MG tablet Commonly known as:  LIPITOR TAKE 1 TABLET BY MOUTH ONCE DAILY   carvedilol 3.125 MG tablet Commonly known as:  COREG Take 1 tablet (3.125 mg total) by mouth 2 (two) times daily with a meal.   citalopram 10 MG tablet Commonly known as:  CELEXA Take 5 mg by mouth daily.   clopidogrel 75 MG tablet Commonly known as:  PLAVIX Take 1 tablet (75 mg total) by mouth daily.   furosemide 40 MG tablet Commonly known as:  LASIX Take 1 tablet (40 mg total) by mouth daily.   insulin aspart 100 UNIT/ML injection Commonly known as:  novoLOG Inject 0-15 Units into the skin 3 (three) times daily with meals.   insulin detemir 100 UNIT/ML injection Commonly known as:  LEVEMIR Inject 0.25 mLs (25 Units total) into the skin 2 (two) times daily.   ipratropium-albuterol 0.5-2.5 (3) MG/3ML  Soln Commonly known as:  DUONEB Take 3 mLs by nebulization every 4 (four) hours as needed.   memantine 10 MG tablet Commonly known as:  NAMENDA Take 10 mg by mouth 2 (two) times daily.   pregabalin 50 MG capsule Commonly known as:  LYRICA Take 50 mg by mouth daily.   ranitidine 150 MG tablet Commonly known as:  ZANTAC Take 150 mg by mouth 2 (two) times daily.   triamcinolone cream 0.1 % Commonly known as:  KENALOG APPLY TOPICALLY TWICE DAILY What changed:  See the new instructions.   Vitamin D3 50000 units Tabs Take 50,000 Units by mouth every 30 (thirty) days. On or about the 1st week of each month       Disposition and follow-up:   Ms.Annastasia Steinborn was discharged from [facility] in Stable condition.  At the hospital follow up visit please address:  1.  Please assess for improvement in respiratory symptoms.   2.Please assess blood pressure. May need to add additional antihypertensive medication as patient remained hypertensive during this admission while on home medication. Consider ARB?  3.  Labs / imaging needed at time of follow-up: BMP   4.  Pending labs/ test needing follow-up: None   Follow-up Appointments:   Hospital Course by problem list: Principal Problem:   CHF (congestive heart failure) ([place]) Active Problems:   Hyperlipidemia   Essential hypertension  Dementia   CKD (chronic kidney disease) stage 4, GFR 15-29 ml/min (HCC)   Hyperkalemia   Acute on chronic diastolic congestive heart failure ([place])   Dyspnea  [name] is a 82 y.o. female with history of DM, HTN, HLD, CKD, and recent (7/16-7/20) admission for CHF and acute on CKD presenting with recurrent CHF exacerbation vs flush pulm edema.  1. Acute on Chronic HFpEF vs flush pulm edema: Patient presented with shortness of breath and orthopnea in the setting of discontinuation of home  diuretics on previous admission (01/2016) due to worsening kidney function. CXR on admission  showed worsening pulmonary edema. Patient was managed with gentle diuresis with IV lasix 40 bid with appropriate urine output and improvement of crackles on lung exam. Patient continued to improve until 8/17 when she had acute worsening of shortness of breath with O2 sats in 70s while attempting to perform a TTE.This was thought to be due to flash pulmonary edema secondary to hypertensive emergency given sBP190s at the time. She required BiPAP support for a short period of time and was transfer to stepdown unit. She was weaned to room air on the same day with adequate O2 sats. On day of discharge, patient was stable without shortness of breath. She was transitioned to PO Lasix 55m. Per PT evaluation, patient can be discharged home as she can follow up with [facility] at [facility]   2. Acute hypoxic respiratory failure: As described above, patient went into acute hypoxic respiratory failure during a TTE. Thought to be due to flash pulmonary edema secondary to elevated BP. Patient required BiPAP for a few hours and was weaned to room air later in the day without any complications. Follow up CXR with improvement in pulmonary edema. On day of discharge patient was satting well on RA.   3. Hypertension: Home amlodipine held on admission due to concern that it may have been a component of her CHF exacerbation. Amlodipine restarted on 8/17 as patient was hypertensive to 170-190s. Patient continued to have elevated BPs with intermittent normal BPs on day of discharge. Was also started on coreg low dose, could not titrate up due to HR being low. We recommend further HTN management on f/up with PCP.  4. CAD s/p CABG: Home ASA, plavix, and atorvastatin were continued.   5. CKD Stage 4: Patient presented with hyperkalemia (K 5.8) in the setting of continuing K supplementation while discontinuing lasix. Patient given kayexalate with resolution of hyperkalemia. No signs/symptoms of hyperkalemia during this admission. She also  presented with Cr  2.71 (baseline ~ 2.2). Cr 2.65 on day of discharge.   6. T2DM: On SSI with adequate blood glucose control.   7. HLD: Home atorvastatin continued.   8. GERD: On famotidine during this admission.   9. Depression: Home celexa continued.   10. Dementia: Home namenda continued.    Discharge Vitals:   BP (!) 142/52   Pulse 62   Temp 98.5 F (36.9 C) (Oral)   Resp 15   Ht _0  (1.6 m)   Wt 147 lb 11.3 oz (67 kg)   SpO2 100%   BMI 26.17 kg/m   Pertinent Labs, Studies, and Procedures:    Ref. Range 03/12/2016 02:41  Sodium Latest Ref Range: 135 - 145 mmol/L 139  Potassium Latest Ref Range: 3.5 - 5.1 mmol/L 4.3  Chloride Latest Ref Range: 101 - 111 mmol/L 106  CO2 Latest Ref Range: 22 - 32 mmol/L 25  BUN Latest Ref Range: 6 - 20 mg/dL  34 (H)  Creatinine Latest Ref Range: 0.44 - 1.00 mg/dL 2.65 (H)  Calcium Latest Ref Range: 8.9 - 10.3 mg/dL 8.5 (L)  EGFR (Non-African Amer.) Latest Ref Range: >60 mL/min 15 (L)  EGFR (African American) Latest Ref Range: >60 mL/min 18 (L)  Glucose Latest Ref Range: 65 - 99 mg/dL 134 (H)  Anion gap Latest Ref Range: 5 - 15  8    1. TTE 8/17: EF 55-60%, mild concentric hypertrophy, normal systolic function, no wall abnormalities, moderate mitral regurgitation, mild dilation of LA, mild TR    2. CXR 03/10/2016: Improvement in CHF and edema since CXR from 8/16. Mild vascular congestion remains. No significant pleural effusion. Mild bibasilar atelectasis improved in the interval.   Discharge Instructions: Discharge Instructions    Diet - low sodium heart healthy    Complete by:  As directed   Discharge instructions    Complete by:  As directed   Please take all of your blood pressure medications and also your Lasix (water pill) every day.  Follow up with your regular doctor in 1 week.  Check your weight every day. If your weight goes up more than 2 pounds then please call your doctor.   Increase activity slowly    Complete by:   As directed      Signed: Dellia Nims, MD 03/12/2016, 3:54 PM   Pager: [place], [place].

## 2016-03-13 DIAGNOSIS — R339 Retention of urine, unspecified: Secondary | ICD-10-CM

## 2016-03-13 LAB — GLUCOSE, CAPILLARY
GLUCOSE-CAPILLARY: 161 mg/dL — AB (ref 65–99)
GLUCOSE-CAPILLARY: 163 mg/dL — AB (ref 65–99)

## 2016-03-13 NOTE — Progress Notes (Signed)
Assisted pt up to [facility].  Pt voided 250cc.  No I&O needed at this time.  Will continue to monitor Saunders Revel T

## 2016-03-13 NOTE — Progress Notes (Signed)
Pt voided 625 BSC.  Bladder scan after with 411-450 in bladder.  CN will not do I&O cath at this time because pt voided.  Will continue to monitor. Saunders Revel T

## 2016-03-13 NOTE — Progress Notes (Signed)
CM received call from RN pt will have [facility] for foley catheter management.  CM notes pt is a [facility] client and CM has called and spoken with [facility] rep, Kennyth Lose who has requested orders and face to face be faxed to [phone].  CM has faxed requested information and has received confirmation of receipt.  No other CM needs were communicated.

## 2016-03-13 NOTE — Progress Notes (Signed)
Inserted urinary catheter per verbal order.  Pt educated re procedure.  Orders for urinary catheter to remain in and pt to D/C tonight.  Urine returned on insertion, 350 cc clear yellow urine.  Drainage bag changed out using sterile technique to a leg bag.  Daughter, Levander Campion, educated on how to care for the foley and how to empty the bag if needed.  [facility] to follow up with pt on foley care.    Of note, just prior to d/c, it was observed that there was some white sediment in the leg bag along with the urine.    Carol Ada, RN

## 2016-03-13 NOTE — Progress Notes (Signed)
Subjective: No acute events overnight. Denies any SOB or chest pain. Ate breakfast, no n/v. No complaints today.   Had problem with urinary retention yesterday, post void residual remained elevated >200, required I/O cath initially with urine output of 400's yesterday evening. Patient denies any urinary problems at home or in the hospital. Denies feeling bladder fullness, burning, dysuria, or other complaints. Has some stress incontinence at home, but no overactive bladder or other symptoms.   Objective:  Vital signs in last 24 hours: Vitals:   03/13/16 0354 03/13/16 0813 03/13/16 0815 03/13/16 0832  BP: (!) 159/55 (!) 151/59 (!) 151/59   Pulse: 60 71 66   Resp: 15 (!) 30 15   Temp: 98.5 F (36.9 C)  98.6 F (37 C)   TempSrc: Oral  Oral   SpO2: 91% 97% 97% 100%  Weight: 149 lb 14.6 oz (68 kg)     Height:       Physical Exam  Constitutional: She is oriented to person, place, and time. She appears well-developed and well-nourished.  In no acute distress  HENT:  Head: Normocephalic and atraumatic.  Cardiovascular: Normal rate and regular rhythm.  Exam reveals no gallop and no friction rub.   No murmur heard. Respiratory: Effort normal and breath sounds normal. No respiratory distress. She has no wheezes.  GI: Soft. Bowel sounds are normal. She exhibits no distension. There is no tenderness.  Musculoskeletal: She exhibits no edema.  Neurological: She is alert and oriented to person, place, and time.     Assessment/Plan: Mrs. Recla is an 82 year old female with a past medical history of diabetes mellitus, hypertension, hyperlipidemia, chronic kidney disease and recent admission for congestive heart failure exacerbation who presented with acute hypoxic respiratory failure secondary to flash pulmonary edema due to hypertensive emergency.  1. Acute hypoxic respiratory failure secondary to flash pulmonary edema, Resolved -- Patient became acutely short of breath following an  echocardiogram procedure requiring BiPAP. BP meds were held on admission for unclear reason and BP. She was diuresed with IV lasix and her BP was controlled after restarting her home BP meds. Patient was weaned from [facility] and has been doing well on room air for last 2 days.  No further episodes of dyspnea - cont PO lasix, will discharge her on lasix 40mg  daily.  2. Hypertension - bp fluctuating 140-170's now.  Patient has a history of hypertension. Her acute hypoxic respiratory failure secondary to flash pulmonary edema may be secondary to hypertensive emergency.  BP fluctuating between 140-170s.  Will cont her home meds for now. HR will not allow going up on the coreg.  -- Amlodipine 10 mg once daily -- Carvedilol 3.125 mg oral 2 times daily -- Continue atorvastatin 20 mg once daily -- Continue clopidogrel 75 mg once daily -f/up outpatient.  3. Urinary retention - likely 2/2 to foley catheter use in the hospital. Patient required foley since she was on IV lasix. Foley d/ced yesterday and since then she had some post void residual. States no urinary problem at home other than stress incontinence sometimes. No overactive bladder or neurogenic bladder or other history in the past. Still voiding but post void bladder scan had >200. - asked RN to repeat post void residual scan, will do I/O cath to confirm this, and if >200 then will discharge with foley - I have called [facility] and asked them to call the patient to give a follow up appt for her urinary retention.   4. Diabetes mellitus Patient  has a history of diabetes, gluocose has been in low 100-mid 100's.  - SSI.   5.. DVT/PE Prophylaxis  -- Enoxaparin 30 mg subcutaneous injection once daily  Dispo: Anticipated discharge likely today  Dellia Nims, MD 03/13/2016, 12:33 PM Pager: [phone]

## 2016-03-13 NOTE — Progress Notes (Signed)
Medicine attending: I examined this patient together with resident physician Dr. Dellia Nims and I concur with his evaluation and management plan which we discussed together. She is stable from the cardiac standpoint but planned discharge yesterday got postponed when she developed significant post void residual urine. In and out catheterization earlier this morning still with 400 mL residual. Patient denies any previous urinary tract symptoms prior to this admission. She does use an Atrovent inhaler which has anticholinergic properties. We will monitor her over the course of the day. If she still has significant residual urine post voiding, we will discharge her with a Foley catheter and referred to urology for a more formal urodynamic evaluation.

## 2016-03-15 ENCOUNTER — Telehealth: Payer: Self-pay | Admitting: *Deleted

## 2016-03-15 NOTE — Telephone Encounter (Signed)
Called pt to get her TCM hosp f/u appt spoke w/daughter Ubaldo Glassing she states that mom goes to [facility] now. Does not need to make appt with MD here...Johny Chess

## 2016-05-03 ENCOUNTER — Telehealth: Payer: Self-pay

## 2016-05-03 NOTE — Telephone Encounter (Signed)
Received OV notes from [facility] to Dr. Asa Lente.  Call pt home number. Spoke to dtr Ubaldo Glassing). She stated that the pt is seen at [facility] and so they are her PCP now.  I called [facility] to inform that of OV notes from [facility]. Notes were faxed.  I called [facility] to inform of the same. Note were faxed back indicating [facility] was pt new PCP.

## 2016-05-04 NOTE — Telephone Encounter (Signed)
Noted in Patient FYI.

## 2016-05-24 ENCOUNTER — Ambulatory Visit
Admission: RE | Admit: 2016-05-24 | Discharge: 2016-05-24 | Disposition: A | Discharge status: Home / Self Care | Payer: No Typology Code available for payment source | Source: Ambulatory Visit | Attending: Nurse Practitioner | Admitting: Nurse Practitioner

## 2016-05-24 ENCOUNTER — Other Ambulatory Visit: Payer: Self-pay | Admitting: Nurse Practitioner

## 2016-05-24 DIAGNOSIS — R0602 Shortness of breath: Secondary | ICD-10-CM

## 2016-11-24 ENCOUNTER — Other Ambulatory Visit: Payer: Self-pay | Admitting: Internal Medicine

## 2016-11-24 ENCOUNTER — Ambulatory Visit
Admission: RE | Admit: 2016-11-24 | Discharge: 2016-11-24 | Disposition: A | Discharge status: Home / Self Care | Payer: Medicare (Managed Care) | Source: Ambulatory Visit | Attending: Internal Medicine | Admitting: Internal Medicine

## 2016-11-24 DIAGNOSIS — M79605 Pain in left leg: Secondary | ICD-10-CM

## 2017-01-09 ENCOUNTER — Emergency Department (HOSPITAL_COMMUNITY): Payer: Medicare (Managed Care)

## 2017-01-09 ENCOUNTER — Encounter (HOSPITAL_COMMUNITY): Payer: Self-pay

## 2017-01-09 DIAGNOSIS — E1121 Type 2 diabetes mellitus with diabetic nephropathy: Secondary | ICD-10-CM | POA: Diagnosis present

## 2017-01-09 DIAGNOSIS — I21A1 Myocardial infarction type 2: Principal | ICD-10-CM | POA: Diagnosis present

## 2017-01-09 DIAGNOSIS — D72829 Elevated white blood cell count, unspecified: Secondary | ICD-10-CM | POA: Diagnosis not present

## 2017-01-09 DIAGNOSIS — Z87891 Personal history of nicotine dependence: Secondary | ICD-10-CM

## 2017-01-09 DIAGNOSIS — Z7982 Long term (current) use of aspirin: Secondary | ICD-10-CM

## 2017-01-09 DIAGNOSIS — R0600 Dyspnea, unspecified: Secondary | ICD-10-CM | POA: Diagnosis not present

## 2017-01-09 DIAGNOSIS — Z8249 Family history of ischemic heart disease and other diseases of the circulatory system: Secondary | ICD-10-CM | POA: Diagnosis not present

## 2017-01-09 DIAGNOSIS — Z888 Allergy status to other drugs, medicaments and biological substances status: Secondary | ICD-10-CM

## 2017-01-09 DIAGNOSIS — I10 Essential (primary) hypertension: Secondary | ICD-10-CM | POA: Diagnosis present

## 2017-01-09 DIAGNOSIS — Z66 Do not resuscitate: Secondary | ICD-10-CM | POA: Diagnosis not present

## 2017-01-09 DIAGNOSIS — L280 Lichen simplex chronicus: Secondary | ICD-10-CM | POA: Diagnosis present

## 2017-01-09 DIAGNOSIS — F0390 Unspecified dementia without behavioral disturbance: Secondary | ICD-10-CM | POA: Diagnosis present

## 2017-01-09 DIAGNOSIS — E1129 Type 2 diabetes mellitus with other diabetic kidney complication: Secondary | ICD-10-CM | POA: Diagnosis present

## 2017-01-09 DIAGNOSIS — IMO0002 Reserved for concepts with insufficient information to code with codable children: Secondary | ICD-10-CM | POA: Diagnosis present

## 2017-01-09 DIAGNOSIS — E1151 Type 2 diabetes mellitus with diabetic peripheral angiopathy without gangrene: Secondary | ICD-10-CM | POA: Diagnosis present

## 2017-01-09 DIAGNOSIS — N179 Acute kidney failure, unspecified: Secondary | ICD-10-CM | POA: Diagnosis present

## 2017-01-09 DIAGNOSIS — E1142 Type 2 diabetes mellitus with diabetic polyneuropathy: Secondary | ICD-10-CM | POA: Diagnosis present

## 2017-01-09 DIAGNOSIS — I252 Old myocardial infarction: Secondary | ICD-10-CM

## 2017-01-09 DIAGNOSIS — I509 Heart failure, unspecified: Secondary | ICD-10-CM | POA: Diagnosis not present

## 2017-01-09 DIAGNOSIS — N184 Chronic kidney disease, stage 4 (severe): Secondary | ICD-10-CM | POA: Diagnosis not present

## 2017-01-09 DIAGNOSIS — Z9842 Cataract extraction status, left eye: Secondary | ICD-10-CM

## 2017-01-09 DIAGNOSIS — Z823 Family history of stroke: Secondary | ICD-10-CM | POA: Diagnosis not present

## 2017-01-09 DIAGNOSIS — Z853 Personal history of malignant neoplasm of breast: Secondary | ICD-10-CM

## 2017-01-09 DIAGNOSIS — I5032 Chronic diastolic (congestive) heart failure: Secondary | ICD-10-CM | POA: Diagnosis not present

## 2017-01-09 DIAGNOSIS — D631 Anemia in chronic kidney disease: Secondary | ICD-10-CM | POA: Diagnosis present

## 2017-01-09 DIAGNOSIS — Z7189 Other specified counseling: Secondary | ICD-10-CM

## 2017-01-09 DIAGNOSIS — R197 Diarrhea, unspecified: Secondary | ICD-10-CM | POA: Diagnosis present

## 2017-01-09 DIAGNOSIS — E1122 Type 2 diabetes mellitus with diabetic chronic kidney disease: Secondary | ICD-10-CM | POA: Diagnosis not present

## 2017-01-09 DIAGNOSIS — E785 Hyperlipidemia, unspecified: Secondary | ICD-10-CM | POA: Diagnosis not present

## 2017-01-09 DIAGNOSIS — Z515 Encounter for palliative care: Secondary | ICD-10-CM | POA: Diagnosis not present

## 2017-01-09 DIAGNOSIS — R0603 Acute respiratory distress: Secondary | ICD-10-CM | POA: Diagnosis not present

## 2017-01-09 DIAGNOSIS — Z955 Presence of coronary angioplasty implant and graft: Secondary | ICD-10-CM

## 2017-01-09 DIAGNOSIS — I455 Other specified heart block: Secondary | ICD-10-CM | POA: Diagnosis not present

## 2017-01-09 DIAGNOSIS — E1165 Type 2 diabetes mellitus with hyperglycemia: Secondary | ICD-10-CM | POA: Diagnosis present

## 2017-01-09 DIAGNOSIS — Z794 Long term (current) use of insulin: Secondary | ICD-10-CM | POA: Diagnosis not present

## 2017-01-09 DIAGNOSIS — Z9841 Cataract extraction status, right eye: Secondary | ICD-10-CM

## 2017-01-09 DIAGNOSIS — E872 Acidosis: Secondary | ICD-10-CM | POA: Diagnosis present

## 2017-01-09 DIAGNOSIS — I5023 Acute on chronic systolic (congestive) heart failure: Secondary | ICD-10-CM | POA: Diagnosis present

## 2017-01-09 DIAGNOSIS — Z961 Presence of intraocular lens: Secondary | ICD-10-CM | POA: Diagnosis present

## 2017-01-09 DIAGNOSIS — R69 Illness, unspecified: Secondary | ICD-10-CM

## 2017-01-09 DIAGNOSIS — D649 Anemia, unspecified: Secondary | ICD-10-CM | POA: Diagnosis present

## 2017-01-09 DIAGNOSIS — K219 Gastro-esophageal reflux disease without esophagitis: Secondary | ICD-10-CM | POA: Diagnosis not present

## 2017-01-09 DIAGNOSIS — I5033 Acute on chronic diastolic (congestive) heart failure: Secondary | ICD-10-CM | POA: Diagnosis not present

## 2017-01-09 DIAGNOSIS — F419 Anxiety disorder, unspecified: Secondary | ICD-10-CM | POA: Diagnosis present

## 2017-01-09 DIAGNOSIS — I130 Hypertensive heart and chronic kidney disease with heart failure and stage 1 through stage 4 chronic kidney disease, or unspecified chronic kidney disease: Secondary | ICD-10-CM | POA: Diagnosis not present

## 2017-01-09 DIAGNOSIS — Z7902 Long term (current) use of antithrombotics/antiplatelets: Secondary | ICD-10-CM

## 2017-01-09 DIAGNOSIS — I214 Non-ST elevation (NSTEMI) myocardial infarction: Secondary | ICD-10-CM | POA: Diagnosis present

## 2017-01-09 DIAGNOSIS — I2510 Atherosclerotic heart disease of native coronary artery without angina pectoris: Secondary | ICD-10-CM | POA: Diagnosis present

## 2017-01-09 DIAGNOSIS — R001 Bradycardia, unspecified: Secondary | ICD-10-CM | POA: Diagnosis not present

## 2017-01-09 DIAGNOSIS — Z951 Presence of aortocoronary bypass graft: Secondary | ICD-10-CM

## 2017-01-09 LAB — COMPREHENSIVE METABOLIC PANEL
ALT: 22 U/L (ref 14–54)
AST: 37 U/L (ref 15–41)
Albumin: 3.4 g/dL — ABNORMAL LOW (ref 3.5–5.0)
Alkaline Phosphatase: 151 U/L — ABNORMAL HIGH (ref 38–126)
Anion gap: 13 (ref 5–15)
BUN: 57 mg/dL — AB (ref 6–20)
CHLORIDE: 105 mmol/L (ref 101–111)
CO2: 22 mmol/L (ref 22–32)
Calcium: 8.7 mg/dL — ABNORMAL LOW (ref 8.9–10.3)
Creatinine, Ser: 3.44 mg/dL — ABNORMAL HIGH (ref 0.44–1.00)
GFR calc Af Amer: 13 mL/min — ABNORMAL LOW (ref >60)
GFR, EST NON AFRICAN AMERICAN: 11 mL/min — AB (ref >60)
Glucose, Bld: 349 mg/dL — ABNORMAL HIGH (ref 65–99)
Potassium: 4.8 mmol/L (ref 3.5–5.1)
Sodium: 140 mmol/L (ref 135–145)
Total Bilirubin: 0.6 mg/dL (ref 0.3–1.2)
Total Protein: 8 g/dL (ref 6.5–8.1)

## 2017-01-09 LAB — CBC
HCT: 30.5 % — ABNORMAL LOW (ref 36.0–46.0)
Hemoglobin: 9.8 g/dL — ABNORMAL LOW (ref 12.0–15.0)
MCH: 29.3 pg (ref 26.0–34.0)
MCHC: 32.1 g/dL (ref 30.0–36.0)
MCV: 91.3 fL (ref 78.0–100.0)
PLATELETS: 262 10*3/uL (ref 150–400)
RBC: 3.34 MIL/uL — ABNORMAL LOW (ref 3.87–5.11)
RDW: 14.4 % (ref 11.5–15.5)
WBC: 10 10*3/uL (ref 4.0–10.5)

## 2017-01-09 LAB — GLUCOSE, CAPILLARY
GLUCOSE-CAPILLARY: 331 mg/dL — AB (ref 65–99)
GLUCOSE-CAPILLARY: 269 mg/dL — AB (ref 65–99)

## 2017-01-09 LAB — BRAIN NATRIURETIC PEPTIDE: B Natriuretic Peptide: 3582.2 pg/mL — ABNORMAL HIGH (ref 0.0–100.0)

## 2017-01-09 LAB — HEPARIN LEVEL (UNFRACTIONATED): HEPARIN UNFRACTIONATED: 0.48 [IU]/mL (ref 0.30–0.70)

## 2017-01-09 LAB — I-STAT TROPONIN, ED: Troponin i, poc: 5.67 ng/mL (ref 0.00–0.08)

## 2017-01-09 LAB — TROPONIN I
TROPONIN I: 8.4 ng/mL — AB (ref <0.03)
Troponin I: 7.46 ng/mL (ref <0.03)

## 2017-01-09 LAB — PROTIME-INR
INR: 1.17
Prothrombin Time: 15 seconds (ref 11.4–15.2)

## 2017-01-09 LAB — CBG MONITORING, ED: GLUCOSE-CAPILLARY: 319 mg/dL — AB (ref 65–99)

## 2017-01-09 MED ORDER — PREGABALIN 50 MG PO CAPS
50.0000 mg | ORAL_CAPSULE | Freq: Every day | ORAL | Status: DC
  Administered 2017-01-10 – 2017-01-12 (×3): 50 mg via ORAL
  Filled 2017-01-09 (×3): qty 2

## 2017-01-09 MED ORDER — INSULIN ASPART 100 UNIT/ML ~~LOC~~ SOLN
3.0000 [IU] | Freq: Three times a day (TID) | SUBCUTANEOUS | Status: DC
  Administered 2017-01-11 (×2): 3 [IU] via SUBCUTANEOUS

## 2017-01-09 MED ORDER — ATORVASTATIN CALCIUM 20 MG PO TABS
20.0000 mg | ORAL_TABLET | Freq: Every day | ORAL | Status: DC
  Administered 2017-01-10 – 2017-01-12 (×3): 20 mg via ORAL
  Filled 2017-01-09 (×3): qty 1

## 2017-01-09 MED ORDER — FUROSEMIDE 10 MG/ML IJ SOLN
40.0000 mg | Freq: Once | INTRAMUSCULAR | Status: AC
  Administered 2017-01-09: 40 mg via INTRAVENOUS
  Filled 2017-01-09: qty 4

## 2017-01-09 MED ORDER — CLOPIDOGREL BISULFATE 75 MG PO TABS: 75.0000 mg | ORAL_TABLET | Freq: Every day | ORAL | Status: DC

## 2017-01-09 MED ORDER — ASPIRIN 81 MG PO CHEW
324.0000 mg | CHEWABLE_TABLET | Freq: Once | ORAL | Status: AC
  Administered 2017-01-09: 324 mg via ORAL
  Filled 2017-01-09: qty 4

## 2017-01-09 MED ORDER — ACETAMINOPHEN 325 MG PO TABS: 650.0000 mg | ORAL_TABLET | Freq: Four times a day (QID) | ORAL | Status: DC | PRN

## 2017-01-09 MED ORDER — INSULIN ASPART 100 UNIT/ML ~~LOC~~ SOLN
0.0000 [IU] | Freq: Three times a day (TID) | SUBCUTANEOUS | Status: DC
  Administered 2017-01-09: 7 [IU] via SUBCUTANEOUS
  Administered 2017-01-10: 2 [IU] via SUBCUTANEOUS

## 2017-01-09 MED ORDER — AMLODIPINE BESYLATE 10 MG PO TABS
10.0000 mg | ORAL_TABLET | Freq: Every day | ORAL | Status: DC
  Administered 2017-01-10 – 2017-01-12 (×3): 10 mg via ORAL
  Filled 2017-01-09 (×3): qty 1

## 2017-01-09 MED ORDER — HEPARIN BOLUS VIA INFUSION
4000.0000 [IU] | Freq: Once | INTRAVENOUS | Status: AC
  Administered 2017-01-09: 4000 [IU] via INTRAVENOUS
  Filled 2017-01-09: qty 4000

## 2017-01-09 MED ORDER — ASPIRIN EC 81 MG PO TBEC
81.0000 mg | DELAYED_RELEASE_TABLET | Freq: Every day | ORAL | Status: DC
  Administered 2017-01-10 – 2017-01-12 (×3): 81 mg via ORAL
  Filled 2017-01-09 (×3): qty 1

## 2017-01-09 MED ORDER — ONDANSETRON HCL 4 MG/2ML IJ SOLN
4.0000 mg | Freq: Four times a day (QID) | INTRAMUSCULAR | Status: DC | PRN
  Administered 2017-01-12: 4 mg via INTRAVENOUS
  Filled 2017-01-09: qty 2

## 2017-01-09 MED ORDER — TICAGRELOR 90 MG PO TABS
90.0000 mg | ORAL_TABLET | Freq: Two times a day (BID) | ORAL | Status: DC
  Administered 2017-01-09 – 2017-01-12 (×6): 90 mg via ORAL
  Filled 2017-01-09 (×8): qty 1

## 2017-01-09 MED ORDER — MEMANTINE HCL 5 MG PO TABS
10.0000 mg | ORAL_TABLET | Freq: Two times a day (BID) | ORAL | Status: DC
  Administered 2017-01-09 – 2017-01-12 (×6): 10 mg via ORAL
  Filled 2017-01-09 (×6): qty 1

## 2017-01-09 MED ORDER — ACETAMINOPHEN 325 MG PO TABS: 650.0000 mg | ORAL_TABLET | Freq: Every day | ORAL | Status: DC | PRN

## 2017-01-09 MED ORDER — SODIUM BICARBONATE 650 MG PO TABS
650.0000 mg | ORAL_TABLET | Freq: Three times a day (TID) | ORAL | Status: DC
  Administered 2017-01-09 – 2017-01-12 (×8): 650 mg via ORAL
  Filled 2017-01-09 (×8): qty 1

## 2017-01-09 MED ORDER — HYDRALAZINE HCL 10 MG PO TABS
15.0000 mg | ORAL_TABLET | Freq: Three times a day (TID) | ORAL | Status: DC
  Administered 2017-01-09 – 2017-01-11 (×6): 15 mg via ORAL
  Filled 2017-01-09 (×7): qty 2

## 2017-01-09 MED ORDER — NITROGLYCERIN 0.4 MG SL SUBL: 0.4000 mg | SUBLINGUAL_TABLET | SUBLINGUAL | Status: DC | PRN

## 2017-01-09 MED ORDER — INSULIN DETEMIR 100 UNIT/ML ~~LOC~~ SOLN
16.0000 [IU] | Freq: Two times a day (BID) | SUBCUTANEOUS | Status: DC
  Administered 2017-01-09 – 2017-01-10 (×2): 16 [IU] via SUBCUTANEOUS
  Filled 2017-01-09 (×5): qty 0.16

## 2017-01-09 MED ORDER — HEPARIN (PORCINE) IN NACL 100-0.45 UNIT/ML-% IJ SOLN
800.0000 [IU]/h | INTRAMUSCULAR | Status: DC
  Administered 2017-01-09 – 2017-01-10 (×2): 800 [IU]/h via INTRAVENOUS
  Filled 2017-01-09 (×2): qty 250

## 2017-01-09 MED ORDER — FAMOTIDINE 20 MG PO TABS
20.0000 mg | ORAL_TABLET | Freq: Two times a day (BID) | ORAL | Status: DC
  Administered 2017-01-09: 20 mg via ORAL
  Filled 2017-01-09: qty 1

## 2017-01-09 MED ORDER — CARVEDILOL 6.25 MG PO TABS
6.2500 mg | ORAL_TABLET | Freq: Two times a day (BID) | ORAL | Status: DC
  Filled 2017-01-09 (×2): qty 1

## 2017-01-09 NOTE — ED Notes (Signed)
Date and time results received: 01/11/2017 1128  Test: I-stat troponin  Critical Value: 5.67  Name of Provider Notified: pfieffer MD

## 2017-01-09 NOTE — H&P (Signed)
Date: 01/21/2017               Patient Name:  [name] MRN: [id number]  DOB: 1929-04-23 Age / Sex: 83 y.o., female   PCP: Patient, No Pcp Per              Medical Service: Internal Medicine Teaching Service              Attending Physician: Dr. Lucious Groves, DO    First Contact: Janett Labella, MS3 Pager: [phone]  Second Contact: Dr. Hetty Ely  Pager: [phone]  Third Contact Dr. Juleen China Pager: [phone]       After Hours (After 5p/  First Contact Pager: [phone]  weekends / holidays): Second Contact Pager: [phone]   Chief Complaint: Shortness of breath  History of Present Illness: [name] is an 83 y/o woman w/ PMH of CHF (diastolic, last EF 99-24%), HTN, CAD (STEMI x2), type 2 diabetes mellitus, CKD Stage 4, and breast cancer (s/p lumpectomy 2000) who presents with acute onset shortness of breath. Nichole pt. Is  Accompanied by daughter and son, both of whom contributed to Nichole history. Nichole pt. Went to bed as usual in no distress. Nichole pt. Awoke at around 6 am, in no distress with no complaints. [name] went back to sleep, re-awoke at around 8 am at which time [name] began experiencing shortness of breath. Nichole pt. Denies chest pain, fever, diaphoresis, light headedness, syncope, nausea, vomiting at Nichole time. [name] does not use O2 at home. Due to concern for pt. Health in setting of multiple comorbidities, Nichole daughter called [facility] who brought pt. To [facility] ED.   In Nichole ED, Nichole pt. Is laying in bed, with son and daughter bedside. [name] denies chest pain, dizziness, vertigo, lightheadedness, but endorses some shortness of breath. Nichole pt says [name] experienced some brief chest pain at rest Nichole day prior that resolved spontaneously, no nitroglycerin was taken. [name] was unable to further elaborate about Nichole quality or severity of pain, and [name] daughter/caregiver was unaware of this event.   In Nichole ED, HR 64, BP 136/58, RR 24, saturating 95% on 4L [place] (EMS found [name] saturating 84% on RA). CXR (single AP) shows enlarged  cardiac silhouette (possible artificially augmented by AP), peribronchial cuffing consistent with interstitial edema, and bilateral pleural effusions. ECG shows concave ST elevations in V3, V4. Troponin 5.67.   Meds:  Outpatient medications  -acetaminophen 325 mg, take 2 TA PO QD PRN for back pain -amlodipine 10 mg TA PO QD -aspirin 81 mg TA PO QD -atorvastatin 20 mg TA PO QD -carvedilol 3.125 mg TA PO BID w/ meal -clopidogrel 75 mg TA PO QD -furosemide 40 mg TA PO QD -hydralazine 10 mg TA, take 1 and 1/2 TA (15 mg) PO TID -insulin aspart 100 unit/ml injection, Inject 0-15 units into skin TID with meals -insulin determir 100 unit/ml injection, Inject 0.25 mLs (25 units total) into Nichole skin BID -ipratropium-albuterol 0.5-2.5 Mg/3 mL soln, take 3 mLs via nebulization Q4H PRN -loperamide 2 mg TA, take 1 TA PO Q4H PRN for diarrhea or loose stools -memantine 10 mg TA PO BID -pregabalin 50 mg CA PO QD -ranitidine 150 mg TA PO BID -sodium bicarbonate 650 mg TA PO TID -triamcinolone cream 0.1%, apply topically BID  Allergies:  -ACE inhibitors--->cough  Past Medical History:  1) Anemia (normocytic, since at least 2010, baseline Hgb 10, no pharmacologic management) 2) Coronary artery disease (hx. Of 2 MI s/p PCI to RCA (2000), left  circumflex, LAD (2012), and diagonal 2; aspirin, statin, P2Y12 inhib) 3) Diastolic CHF ( LV EF 00-34% in 2017, moderate mitral regurgitation; furosemide, carvedilol, ipratropium-albuterol 0.5-2.5 Mg/3 mL soln) 4) CKD (Stage 4, makes urine, baseline creatinine approx. 3.0; sodium bicarbonate for met. Acidosis) 5) Dementia (memantine) 6) DM, type 2 (insulin dependent) 7) HTN (amlodipine, hydralazine) 8) Diarrhea (loperamide) 9) Neurodermatitis (triamcinolone) 10) GERD (ranitidine)  Past Surgical History:  Procedure Laterality Date  . BREAST LUMPECTOMY  2000   right  . CAPSULECTOMY  12/27/2010   L eye  . CARDIAC SURGERY    . CATARACT EXTRACTION W/  INTRAOCULAR LENS  IMPLANT, BILATERAL    . COMBINED HYSTEROSCOPY DIAGNOSTIC / D&C  07/2004   /e-chart  . CORONARY ANGIOPLASTY WITH STENT PLACEMENT     "twice"  . CORONARY ARTERY BYPASS GRAFT  2000   CABG X1  . DILATION AND CURETTAGE OF UTERUS    . EYE SURGERY     Cataracts (R) eye 06/1999 & (L) eye 09/1999- [place]  . LEFT HEART CATHETERIZATION WITH CORONARY ANGIOGRAM N/A 11/22/2011   Procedure: LEFT HEART CATHETERIZATION WITH CORONARY ANGIOGRAM;  Surgeon: Clent Demark, MD;  Location: [facility] CATH LAB;  Service: Cardiovascular;  Laterality: N/A;  . LEFT HEART CATHETERIZATION WITH CORONARY ANGIOGRAM N/A 10/03/2012   Procedure: LEFT HEART CATHETERIZATION WITH CORONARY ANGIOGRAM;  Surgeon: Clent Demark, MD;  Location: [facility] CATH LAB;  Service: Cardiovascular;  Laterality: N/A;   Family History: Father: MI (25s) Mother: Stroke (32s) Son: Diabetes mellitus 2 Not other extended family history. No family cancer history  Social History: -No occupation -Widowed -No longer smoker, 1/2 pk x 40 years prior to quitting approx. 20 years ago -Does not currently consume alcohol, but used to drink "heavily" in Nichole past (neither patient nor family able to quantify) -Outpatient medical care/medications/insurance provided by [facility] [facility] for Nichole Elder[facility]  -Pt. Lives with daughter, who is [name] primary caregiver  Review of Systems: Constitutional: denies fevers, chills, night sweats, weight loss HEENT: Denies headache, nausea, vomiting, visual changes, hearing changes, vertigo, dizziness, syncope. Endorses rhinorrhea. Cardiac: Denies chest pain; Endorses orthopnea Respiratory: Denies cough, sputum production, hemoptysis. Endorses shortness of breath at rest.  GI: Denies nausea, vomiting, melena, hematochezia. Endorses recent diarrhea, which was brown w/out blood. GU: Denies change in urinary frequency, hesitancy, urgency, polyuria, dysuria, gross hematuria, incontinence Skin: Denies  rashes Psych: Denies change in mentation, mood, memory.  Physical Exam: Blood pressure (!) 131/50, pulse 60, resp. rate 19, height _0  (1.6 m), weight 65.8 kg (145 lb), SpO2 98 %.  General: No acute distress, laying comfortably in bed Skin: Appropriate tone, no rashes HEENT: No neck masses, no LAD in cervical chains, occipital, retroauricaular or supraclavicular region, thyroid normal size without masses and symmetric CV: Regular rate and rhythm, without murmurs, rubs, or gallops. JVP 9 cm. Positive hepatojugular reflux test. 2+ pulses radial and dorsalis pedis bilaterally Pulm: Lungs clear to auscultation bilaterally. Vesicular breath sounds throughout. No wheezing, crackles. Normal effort of breathing with good air movement Abdomen: Normoactive bowel sounds, no bruits. Soft, nontender throughout. No masses, no guarding. Liver span unable to be measured due to body habitus. Rectal: Rectal exam not indicated.  Neuro: Oriented x3. CN 3-12 intact: extraocular movements intact bilaterally, pupils 3 mm reactive to light and accommodating bilaterally, symmetric face above and below eyebrows, tongue midline, symmetric elevation of pharynx, sensation intact bilaterally V1-V3, hearing intact bilaterally, symmetric shoulder shrug. Sensation intact x4 and symmetric, 5+ strength x4. Reflexes 2+ throughout.  No dysmetria, no pronator drift. Extremities: no pitting edema, warm and well perfused  Lab results: Basic Metabolic Panel:  Recent Labs  01/08/2017 1046  NA 140  K 4.8  CL 105  CO2 22  GLUCOSE 349*  BUN 57*  CREATININE 3.44*  CALCIUM 8.7*   Liver Function Tests:  Recent Labs  01/21/2017 1046  AST 37  ALT 22  ALKPHOS 151*  BILITOT 0.6  PROT 8.0  ALBUMIN 3.4*   CBC:  Recent Labs  01/05/2017 1046  WBC 10.0  HGB 9.8*  HCT 30.5*  MCV 91.3  PLT 262   Cardiac Enzymes: Troponin: 5.67  CBG:  Recent Labs  01/18/2017 1045  GLUCAP 319*   Coagulation:  Recent Labs   12/29/2016 1046  LABPROT 15.0  INR 1.17   Imaging results:  CXR (single AP) shows enlarged cardiac silhouette, peribronchial cuffing consistent with interstitial edema, and bilateral pleural effusions.  Other results: EKG: Rate 66, sinus rhythm, PR interval approx. 0.15, QRS complex 0.08, QTc 430. Concave ST-segment elevations in V3, V4.  Assessment & Plan by Problem: Active Problems:   Uncontrolled type 2 diabetes with renal manifestation (HCC)   Hyperlipidemia   ANEMIA   Essential hypertension   Coronary atherosclerosis   CKD (chronic kidney disease) stage 4, GFR 15-29 ml/min (HCC)   CHF (congestive heart failure) (HCC)   NSTEMI (non-ST elevated myocardial infarction) [facility])  Ms. Palleschi is an 83 y/o woman w/ PMH of CHF, HTN, type 2 diabetes mellitus, CKD Stage 4, and breast cancer who presents with acute onset shortness of breath in setting of elevated troponin and anterior lead ST-segment abnormalities most concerning for ACS +/- acute decompensated .  Shortness of breath: In pt. With hx. Of MI x2, HEART score of 9 (high risk), ECG findings of concave ST elevations, and significantly elevated troponin most concerning for Type 1 NSTEMI. Against this diagnosis are Nichole lack of chest pain on presentation, and lack of ST segment depressions or T wave inversions in affected leads. Decompensated heart failure remains as a possible explanation for Nichole SOB given difficulty of breathing in setting of hx. Of CHF and CXR findings of interstitial edema and pleural effusions. Possibility includes decompensated CHF secondary to Type 1 NSTEMI. Less likely are atypical pneumonia given lack of cough, fever. Less likely is PE with Wells score of 1.5 indicating <3% incidence in this low risk population. TIMI score 6-high risk. Pneumothorax effectively ruled out with CXR. Recurrence/metastasis of breast cancer unlikely given lack of constitutional symptoms and lack of nodules/masses on CXR. Given age and  consideration that pt. Is not experiencing pain or major discomfort, conservative therapy over invasive.  -Trend ECG -Trend troponins -Telemetry -[place] O2 w/ goal >90% -Nitroglycerin 0.4 mg PRN -home carvedilol -high-dose atorvastatin-->80 mg QD  -home aspirin and clopidogrel -therapeutic enoxaparin, 39m/kg QP38S Diastolic CHF: Concern for possible decompensation. Supportive is SOB, elevated JVP, positive hepatojugular reflux text, and CXR findings discussed above, and O2 desaturation on RA. Lack of peripheral edema against this. Warm extremities makes less concerning for LV decompensation, and reassuring for adequate perfusion.  -daily weights -strict I&Os -BNP -home carvedilol as above -home furosemide -ipratropium-albuterol 0.5-2.5 Mg/3 mL soln -trend BMP for electrolyte abnormalities   Diabetes mellitus, type 2: Hx. Of uncontrolled blood glucose. Glu 349 on admission  -Sliding scale insulin, goal blood glucose 140-180 during hospital stay  CKD (Stage 4): Makes urine. No HD. Baseline Cr 2.5-3.0. Elevated at 3.44.  -Trend BMPs (propensity for electrolyte abnormalities including  hyperkalemia) -home sodium bicarbonate -Monitor urine output  HTN: Essential HTN. Baseline 160-170/80-90. Currently 132/55. Goal of baseline or lower.   -home amlodipine -home hydralazine  Dementia: Family reports pt. Is at baseline, no acute changes.   -home memantine  Diarrhea: Recent bouts of diarrhea (past few days before presentation).  -home loperamide  GERD: No acute complaints. Stable  -home ranitidine    Neurodermatitis: Stable.  -Triamcinolone  Dispo: Floor FEN/GI: ranitidine Diet: Heart healthy, low-sodium DVT ppx: enoxaparin   This is a Careers information officer Note.  Nichole care of Nichole patient was discussed with Dr. Hetty Ely and Nichole assessment and plan was formulated with their assistance.  Please see their note for official documentation of Nichole patient encounter.   Signed: Jillyn Hidden, Medical Student 12/26/2016, 3:22 PM

## 2017-01-09 NOTE — Progress Notes (Signed)
ANTICOAGULATION CONSULT NOTE - Initial Consult  Pharmacy Consult for heparin Indication: chest pain/ACS  Allergies  Allergen Reactions  . Ace Inhibitors Cough    Patient Measurements: Height: 5\' 3"  (160 cm) Weight: 145 lb (65.8 kg) IBW/kg (Calculated) : 52.4 Heparin Dosing Weight: 65.6kg  Vital Signs: BP: 126/52 (06/18 1130) Pulse Rate: 51 (06/18 1130)  Labs:  Recent Labs  01/08/2017 1046  HGB 9.8*  HCT 30.5*  PLT 262  LABPROT 15.0  INR 1.17  CREATININE 3.44*    Estimated Creatinine Clearance: 10.9 mL/min (A) (by C-G formula based on SCr of 3.44 mg/dL (H)).   Medical History: Past Medical History:  Diagnosis Date  . ANEMIA   . Anemia of chronic disease   . Angina   . Breast cancer ([place]) 2000   right  . CAD 2000, 2011   MI 2000, NQWMI 09/2009  . CHF (congestive heart failure) ([place])   . Chronic kidney disease (CKD), stage IV (severe) ([place])   . Dementia   . DEPRESSION   . DIABETES MELLITUS, TYPE II    renal, neuro - peripheral neuropathy  . Hypercholesteremia   . HYPERTENSION   . Inferior MI ([place]) 2000   "quiet"  . INSOMNIA, CHRONIC   . Loose stools   . Neurodermatitis   . Non-Q wave myocardial infarction ([place]) 10/2009   /E-chart  . PVD   . Shortness of breath dyspnea     Medications:  Infusions:  . heparin      Assessment: 76 yof presented to the ED with SOB. Found to have an elevated troponin of 5.67. Baseline H/H low but platelets are WNL. To start IV heparin. She is not on anticoagulation PTA.   Goal of Therapy:  Heparin level 0.3-0.7 units/ml Monitor platelets by anticoagulation protocol: Yes   Plan:  Heparin bolus 4000 units IV x 1 Heparin gtt 800 units/hr Check an 8 hr heparin level Daily heparin level and CBC  Aunya Lemler, Rande Lawman 01/03/2017,12:18 PM

## 2017-01-09 NOTE — H&P (Signed)
Date: 01/08/2017               Patient Name:  [name] MRN: [id number]  DOB: 1929/06/24 Age / Sex: 84 y.o., female   PCP: Patient, No Pcp Per         Medical Service: Internal Medicine Teaching Service         Attending Physician: Dr. Lucious Groves, DO    First Contact: Dr. Hetty Ely Pager: [phone]  Second Contact: Dr. Juleen China Pager: [phone]       After Hours (After 5p/  First Contact Pager: [phone]  weekends / holidays): Second Contact Pager: [phone]   Chief Complaint: shortness of breath   History of Present Illness: [name] is an 83 yo woman with PMH CAD with MI x 2 s/p multivessel PCI  diastolic CHF, moderate mitral regurgitation, HTN, insulin dependent type 2 DM, HLD, CKD stage IV, anemia of chronic disease, dementia, and breast cancer who was found to have acute onset shortness of breath, nausea, diaphoresis, and dizziness when her daughter in law went to wake her up this morning. She reports that she has chest pain at rest sometimes but is not able to further describe the character of the pain or what makes it better. She reports that she had orthopnea last night and has started experiencing that symptom recently.  She denies chest pain, cough, wheeze, fever, or syncope. She has no know hx of DVT or PE.  She had symptoms similar to these months ago, at that time she was brought to [facility] and her family was told that she had a low oxygen. Her [facility] physicians managed this at the office and she was better when her family picked her up that afternoon.  In the ED she was afebrile, HR 50-60s, SpO2 95% on 2 liters nasal canula. Labs revealed trop 5.67, glucose 349, crt 3.44 (up from b/l 2.6 one year prior), hgb 9.8 ( at baseline 9-10s). Chest xray revealed acute pulmonary edema.   Meds:  Current Meds  Medication Sig  . acetaminophen (TYLENOL) 325 MG tablet Take 650 mg by mouth daily as needed (back pain).  Marland Kitchen amLODipine (NORVASC) 10 MG tablet Take 10 mg by mouth daily.  Marland Kitchen aspirin  81 MG tablet Take 1 tablet (81 mg total) by mouth daily.  Marland Kitchen atorvastatin (LIPITOR) 20 MG tablet TAKE 1 TABLET BY MOUTH ONCE DAILY  . carvedilol (COREG) 3.125 MG tablet Take 1 tablet (3.125 mg total) by mouth 2 (two) times daily with a meal. (Patient taking differently: Take 6.25 mg by mouth 2 (two) times daily with a meal. )  . clopidogrel (PLAVIX) 75 MG tablet Take 1 tablet (75 mg total) by mouth daily.  . furosemide (LASIX) 40 MG tablet Take 1 tablet (40 mg total) by mouth daily. (Patient taking differently: Take 30 mg by mouth daily. )  . hydrALAZINE (APRESOLINE) 10 MG tablet Take 15 mg by mouth 3 (three) times daily.  . insulin detemir (LEVEMIR) 100 UNIT/ML injection Inject 0.25 mLs (25 Units total) into the skin 2 (two) times daily. (Patient taking differently: Inject 20 Units into the skin 2 (two) times daily. )  . loperamide (IMODIUM A-D) 2 MG tablet Take 2 mg by mouth every 4 (four) hours as needed for diarrhea or loose stools.  . memantine (NAMENDA) 10 MG tablet Take 10 mg by mouth 2 (two) times daily.  . pregabalin (LYRICA) 50 MG capsule Take 50 mg by mouth daily.  . ranitidine (ZANTAC) 150  MG tablet Take 150 mg by mouth 2 (two) times daily.  . sodium bicarbonate 650 MG tablet Take 650 mg by mouth 3 (three) times daily.     Allergies: Allergies as of 12/30/2016 - Review Complete   Allergen Reaction Noted  . Ace inhibitors Cough 06/17/2013   Past Medical History:  Diagnosis Date  . ANEMIA   . Anemia of chronic disease   . Angina   . Breast cancer ([place]) 2000   right  . CAD 2000, 2011   MI 2000, NQWMI 09/2009  . CHF (congestive heart failure) ([place])   . Chronic kidney disease (CKD), stage IV (severe) ([place])   . Dementia   . DEPRESSION   . DIABETES MELLITUS, TYPE II    renal, neuro - peripheral neuropathy  . Hypercholesteremia   . HYPERTENSION   . Inferior MI ([place]) 2000   "quiet"  . INSOMNIA, CHRONIC   . Loose stools   . Neurodermatitis   . Non-Q wave myocardial  infarction ([place]) 10/2009   /E-chart  . PVD   . Shortness of breath dyspnea     Family History: Father died of MI in her 67s. Mother had a stroke in her 29s. Son has diabetes  Social History: Quit smoking 10 years ago, smoked for 40 years less than one pack per day. History of alcohol use quit drinking 20 years ago.  Review of Systems: A complete ROS was negative except as per HPI.   Physical Exam: Blood pressure (!) 138/47, pulse (!) 58, temperature 99.1 F (37.3 C), temperature source Oral, resp. rate 16, height 5\' 3"  (1.6 m), weight 142 lb 4.8 oz (64.5 kg), SpO2 99 %. Physical Exam  Constitutional: She appears well-developed and well-nourished. No distress.  HENT:  Head: Normocephalic and atraumatic.  Eyes: Conjunctivae are normal. Right eye exhibits no discharge. Left eye exhibits no discharge. No scleral icterus.  Cardiovascular: Normal rate and regular rhythm.   No murmur heard. No peripheral edema, peripheral pulses are thready  Pulmonary/Chest: Effort normal and breath sounds normal. No respiratory distress. She has no wheezes. She has no rales.  Abdominal: Soft. She exhibits no distension. There is no tenderness. There is no guarding.  Neurological: She is alert.  Oriented to person and place (hospital)  Skin: Skin is warm and dry. She is not diaphoretic.  Psychiatric: She has a normal mood and affect. Her behavior is normal.   EKG: Normal sinus rhythm, left axis deviation Mini Q waves in inferior leads, T-wave flattening in anterior leads compared to prior August 2017  CXR: Pulmonary edema, bilateral pleural effusions worse when compared to prior in October 2017  Assessment & Plan by Problem: Principal Problem:   NSTEMI (non-ST elevated myocardial infarction) ([place]) Active Problems:   Uncontrolled type 2 diabetes with renal manifestation (HCC)   Hyperlipidemia   ANEMIA   Essential hypertension   Coronary atherosclerosis   Dementia   CKD (chronic kidney disease)  stage 4, GFR 15-29 ml/min (HCC)   CHF (congestive heart failure) ([place])  83 year old woman with past medical history of coronary artery disease status post multivessel PCI, hyper lipidemia, hypertension presents with shortness of breath and was found to have an elevated troponin and pulmonary edema on chest xray.   CAD with hx of MIx2 s/p multivessel PCI  Last cardiac cath 09/2012 revealed diffuse small-vessel distal RCA and posterolateral branch stenosis and ostial diagonal 1 stenosis. She has multiple risk factors for coronary artery disease including hypertension, hyperlipidemia, uncontrolled diabetes and history of  CAD, heart score correlates with high risk of ACS. She received aspirin 324 in the ED and then was started on heparin drip. He reports no chest pain at this time was evaluated by cardiology in the ED Discontinue home meds aspirin 81 mg daily and Carvedilol 3.125 mg BID, cardiology has transitioned her to ticagrelor  Continue home med atorvastatin 20 mg daily Trend troponins Follow-up morning EKG   follow-up echocardiogram Nitroglycerine PRN  Zofran when necessary  Diastolic CHF  Elevated pulmonary artery pressure  Last echo 02/2016: EF 16-10%, grade 1 diastolic dysfunction, pulmonary artery pressure 57 mmHg. Although she appears euvolemic on exam and weight is actually down from prior, chest x-ray is consistent with pulmonary edema. Ordered IV Lasix 40 mg 1 time dose Follow-up the BNP Follow-up chest x-ray tomorrow morning  Hypertension  Pressure is well controlled on home medications. Continue home and Amlodipine 10 mg daily, hydralazine 50 mg 3 times a day  Uncontrolled type 2 diabetes Last hemoglobin A1c 01/2016 8.1. Home medications include sliding scale NovoLog 0-15 units 3 times a day, Levemir 25 units twice a day. Short acting insulin 3 units TID with meals  Ordered sensitive sliding scale Ordered Levemir 16 units BID  CBG monitoring   GERD Stable. She has home  ranitidine 150 mg twice daily at home.  Ordered famotidine 20 mg twice a day  Dementia Continue home in the [place] 10 mg twice a day Delirium precautions  Dispo: Admit patient to Observation with expected length of stay less than 2 midnights.  Signed: Ledell Noss, MD 01/21/2017, 6:55 PM  Pager: [phone]

## 2017-01-09 NOTE — Progress Notes (Signed)
ANTICOAGULATION CONSULT NOTE - Follow-up Consult  Pharmacy Consult for heparin Indication: chest pain/ACS  Allergies  Allergen Reactions  . Ace Inhibitors Cough    Patient Measurements: Height: 5\' 3"  (160 cm) Weight: 142 lb 4.8 oz (64.5 kg) IBW/kg (Calculated) : 52.4 Heparin Dosing Weight: 65.6kg  Vital Signs: Temp: 98.9 F (37.2 C) (06/18 1929) Temp Source: Oral (06/18 1929) BP: 132/55 (06/18 1929) Pulse Rate: 62 (06/18 1929)  Labs:  Recent Labs  12/26/2016 1046 01/13/2017 1826 01/03/2017 2301  HGB 9.8*  --   --   HCT 30.5*  --   --   PLT 262  --   --   LABPROT 15.0  --   --   INR 1.17  --   --   HEPARINUNFRC  --   --  0.48  CREATININE 3.44*  --   --   TROPONINI  --  7.46* 8.40*    Estimated Creatinine Clearance: 10.8 mL/min (A) (by C-G formula based on SCr of 3.44 mg/dL (H)).  Assessment: 80 yof on heparin for NSTEMI. Heparin level therapeutic (0.48) on gtt at 800 units/hr. No bleeding noted.  Goal of Therapy:  Heparin level 0.3-0.7 units/ml Monitor platelets by anticoagulation protocol: Yes   Plan:  Continue heparin at 800 units/hr Will f/u am heparin level to confirm therapeutic  Sherlon Handing, PharmD, BCPS Clinical pharmacist, pager [phone] 01/02/2017,11:59 PM

## 2017-01-09 NOTE — ED Notes (Signed)
Attempted Report 

## 2017-01-09 NOTE — Consult Note (Signed)
CARDIOLOGY CONSULT NOTE  Patient ID: [name] MRN: [id number] DOB/AGE: 1928/12/03 82 y.o.  Admit date: 01/19/2017 Referring Physician  Charlesetta Shanks, MD Primary Physician:  Patient, No Pcp Per Reason for Consultation  NSTEMI  HPI: [name]  is a 83 y.o. female   with known coronary artery disease with history of myocardial infarctions in the remote past, history of angioplasty to the right, circumflex, LAD, last cardiac catheterization was 11/22/2011 when she presented with generalized weakness. At that time she was found to have moderate diffuse disease in the major vessels and small vessel branches had high-grade stenosis but not amenable for PCI due to small vessels and recommended medical therapy.  She is now admitted to the hospital generalized weakness, worsening dyspnea on exertion. She was last admitted to the hospital with acute pulmonary edema and diastolic heart failure on 03/08/2016. She has preserved LVEF by echocardiogram on 03/10/2016.  Heart medical history includes uncontrolled diabetes mellitus with stage IV chronic kidney disease, chronic anemia, hypertension, hyperlipidemia, dementia.  Patient denies any chest pain or shortness of breath.  States that she is just very fatigued and tired.  Her son and daughter are present the bedside.  They have noticed worsening dementia, generalized weakness gradually was the past 6 months.  They deny any recent GI bleed, no dizziness, syncope.  Past Medical History:  Diagnosis Date  . ANEMIA   . Anemia of chronic disease   . Angina   . Breast cancer ([place]) 2000   right  . CAD 2000, 2011   MI 2000, NQWMI 09/2009  . CHF (congestive heart failure) ([place])   . Chronic kidney disease (CKD), stage IV (severe) ([place])   . Dementia   . DEPRESSION   . DIABETES MELLITUS, TYPE II    renal, neuro - peripheral neuropathy  . Hypercholesteremia   . HYPERTENSION   . Inferior MI ([place]) 2000   "quiet"  . INSOMNIA, CHRONIC   . Loose stools   .  Neurodermatitis   . Non-Q wave myocardial infarction ([place]) 10/2009   /E-chart  . PVD   . Shortness of breath dyspnea      Past Surgical History:  Procedure Laterality Date  . BREAST LUMPECTOMY  2000   right  . CAPSULECTOMY  12/27/2010   L eye  . CARDIAC SURGERY    . CATARACT EXTRACTION W/ INTRAOCULAR LENS  IMPLANT, BILATERAL    . COMBINED HYSTEROSCOPY DIAGNOSTIC / D&C  07/2004   /e-chart  . CORONARY ANGIOPLASTY WITH STENT PLACEMENT     "twice"  . CORONARY ARTERY BYPASS GRAFT  2000   CABG X1  . DILATION AND CURETTAGE OF UTERUS    . EYE SURGERY     Cataracts (R) eye 06/1999 & (L) eye 09/1999- [place]  . LEFT HEART CATHETERIZATION WITH CORONARY ANGIOGRAM N/A 11/22/2011   Procedure: LEFT HEART CATHETERIZATION WITH CORONARY ANGIOGRAM;  Surgeon: Clent Demark, MD;  Location[facility] LAB;  Service: Cardiovascular;  Laterality: N/A;  . LEFT HEART CATHETERIZATION WITH CORONARY ANGIOGRAM N/A 10/03/2012   Procedure: LEFT HEART CATHETERIZATION WITH CORONARY ANGIOGRAM;  Surgeon: Clent Demark, MD;  Location: [facility] CATH LAB;  Service: Cardiovascular;  Laterality: N/A;     Family History  Problem Relation Age of Onset  . Coronary artery disease Father      Social History: Social History   Social History  . Marital status: Widowed    Spouse name: N/A  . Number of children: N/A  . Years of education: N/A  Occupational History  . Not on file.   Social History Main Topics  . Smoking status: Former Smoker    Packs/day: 1.00    Years: 20.00    Types: Cigarettes  . Smokeless tobacco: Never Used     Comment: 11/21/11 "quit smoking 30-40 years ago  . Alcohol use No     Comment: 11/21/11 "used to drink quit alot; last drink of alcohol maybe 30 years ago"  . Drug use: No  . Sexual activity: Not Currently   Other Topics Concern  . Not on file   Social History Narrative   Lives alone with her dog. Very active in her church chair. Volunteers at [employer]    Review of  Systems - Negative except fatigue, chronic dyspnea, no claudication, walks with walker and cane at home, No PND or orthopnea. Generalized weakness presnet. See HOPI     Physical Exam: Blood pressure (!) 131/50, pulse 60, resp. rate 19, height 5' 3"  (1.6 m), weight 65.8 kg (145 lb), SpO2 98 %.   General appearance: alert, cooperative, appears stated age, fatigued and no distress Lungs: clear to auscultation bilaterally Chest wall: no tenderness Heart: regular rate and rhythm, S1, S2 normal, no murmur, click, rub or gallop Abdomen: soft, non-tender; bowel sounds normal; no masses,  no organomegaly Extremities: extremities normal, atraumatic, no cyanosis or edema Pulses: Carotid pulses 2+, normal.  Femoral pulse normal.  Faint popliteal pulse.  Absent pedal pulses. Neurologic: Grossly normal without focal deficit.  Complete examination not performed.  Labs:  Lab Results  Component Value Date   WBC 10.0 12/28/2016   HGB 9.8 (L) 12/26/2016   HCT 30.5 (L) 01/05/2017   MCV 91.3 01/02/2017   PLT 262 01/12/2017    Recent Labs Lab 01/01/2017 1046  NA 140  K 4.8  CL 105  CO2 22  BUN 57*  CREATININE 3.44*  CALCIUM 8.7*  PROT 8.0  BILITOT 0.6  ALKPHOS 151*  ALT 22  AST 37  GLUCOSE 349*   Lipid Panel     Component Value Date/Time   CHOL 132 11/07/2013 1125   TRIG 114.0 11/07/2013 1125   TRIG 130 10/12/2009   HDL 31.10 (L) 11/07/2013 1125   CHOLHDL 4 11/07/2013 1125   VLDL 22.8 11/07/2013 1125   LDLCALC 78 11/07/2013 1125    BNP (last 3 results)  Recent Labs  02/07/16 0010 02/07/16 0642 03/09/16 0840  BNP 290.6* 292.9* 416.4*    HEMOGLOBIN A1C Lab Results  Component Value Date   HGBA1C 8.1 (H) 02/08/2016   MPG 186 02/08/2016   Troponin (Point of Care Test)  Recent Labs  01/07/2017 1112  TROPIPOC 5.67*    Radiology: Dg Chest Portable 1 View  Result Date: 01/10/2017 CLINICAL DATA:  83 year old female with shortness of breath. Inferior myocardial infarction.  EXAM: PORTABLE CHEST 1 VIEW COMPARISON:  05/24/2016 and earlier. FINDINGS: Portable AP semi upright view at 1132 hours. Chronic cardiomegaly. Veiling opacities at both lung bases appear increased compared to October. Increased pulmonary vascularity and bilateral pulmonary interstitial opacity. No pneumothorax. Visualized tracheal air column is within normal limits. Negative visible bowel gas pattern. IMPRESSION: 1. Acute pulmonary edema. 2. Small bilateral pleural effusions may have increased since 2017. 3. Chronic cardiomegaly. Electronically Signed   By: Genevie Ann M.D.   On: 01/10/2017 12:09   Scheduled Meds: . [START ON 01/10/2017] amLODipine  10 mg Oral Daily  . [START ON 01/10/2017] aspirin EC  81 mg Oral Daily  . [START ON  01/10/2017] atorvastatin  20 mg Oral Daily  . carvedilol  6.25 mg Oral BID WC  . [START ON 01/10/2017] clopidogrel  75 mg Oral Daily  . famotidine  20 mg Oral BID  . hydrALAZINE  15 mg Oral TID  . insulin aspart  0-9 Units Subcutaneous TID WC  . insulin detemir  16 Units Subcutaneous BID  . memantine  10 mg Oral BID  . [START ON 01/10/2017] pregabalin  50 mg Oral Daily  . sodium bicarbonate  650 mg Oral TID   Continuous Infusions: . heparin 800 Units/hr (01/10/2017 1220)   PRN Meds:.acetaminophen, nitroGLYCERIN, ondansetron (ZOFRAN) IV   CARDIAC STUDIES:  EKG: 01/21/2017: Normal sinus rhythm, left axis deviation, LVH with repolarization of rheumatic. No change in T-wave inversion from prior EKG 03/10/2016.  Echocardiogram 03/10/2016: Normal LV systolic function without wall motion of the normality, EF 55-60%. Grade 1 diastolic dysfunction. Moderate MR, left atrium moderately dilated. Mild TR and moderate pulmonary hypertension, PA pressure 57 mmHg.  Coronary angiogram 11/22/2011: Left main had 60% distal stenosis.  LAD has 10-15% proximal and 60-65% mid stenosis.  Distal stent in LAD is patent.  Diagonal #1 is very small.  Diagonal #2 has 70% ostial stenosis.  Stented  portion in the proximal and mid portion is patent.  Ramus is very small which is diffusely diseased.  Left circumflex has 30% proximal and 30-40% distal diffuse stenosis at prior PTCA site.  OM-1 is very, very small.  OM-2 has 75-80% mid stenosis at prior PTCA site.  Vessel is very small.  RCA has 20-30% mid and 30-40% distal stenosis.  PDA is small which has 70% proximal stenosis and 30-40% distal diffuse stenosis.  PLV branch is also very small which has 95% ostial stenosis at the bifurcation with PDA.  ASSESSMENT AND PLAN:  1. NSTEMI 2. CAD native vessels with remote PCI to all three coronary arteries and moderate to severe small vessel disease by angiogram 2013. 3. Chronic diastolic heart failure 4. DM 2 uncontrolled with Vascular and renal complications, CKD stage 4. 5. Dementia 6. Anemia of chronic disease 7. Hypertension 8. Hyperliipidemia  Recommendations:  Extremely frail patient was presented with generalized weakness and  NSTEMI. I had a  very lengthy discussion with the patient's son and also daughter at the bedside.  She is not a candidate for any advanced cardiac workup.  Would recommend medical therapy only, the could certainly try Brilinta for 8-12 weeks due to NSTEMI.  Long-term dual antiplatelet therapy in a patient with underlying anemia and chronic renal failure may pose increased risk for bleeding complications.  Otherwise at home patient is presently on aggressive medical therapy, continue the same and can potentially increase carvedilol from 3.125 mg to 6.25 mg p.o. b.i.d.  Not on an ACE inhibitor or an ARB due to renal failure. I have met with the house staff and discussed her presentation.  I also discussed with the patient's children regarding long-term goals they understand that their mother is gradually deteriorating with regard to general health.  They agreed to make her DO NOT RESUSCITATE.  I will fill up the Most Form prior to the discharge.    Adrian Prows,  MD 01/11/2017, 2:15 PM [facility]. [place] Pager: [phone] Office: [phone] If no answer Cell [phone]

## 2017-01-09 NOTE — ED Triage Notes (Signed)
Pt brought by [facility] for Shortness of breath pt was found to be at 84% room air. Pt placed on 4L and brought up to 97%. Per daughter pt is not acting right. Pt has a slight right sided facial drop but daughter is not sure when that started.

## 2017-01-09 NOTE — ED Provider Notes (Signed)
[facility] DEPT Provider Note   CSN: [id number] Arrival date & time: 12/26/2016  1023     History   Chief Complaint Chief Complaint  Patient presents with  . Shortness of Breath    HPI [name] is a 83 y.o. female.  HPI Patient had to get up early in the morning to the bathroom around 5 AM. She was unable to get back up off the toilet and called her daughter for assistance. The patient daughter reports that she was generally weak and had trouble standing back up. When she was up however she walked with an even gait and did not have any focal area of extremity dysfunction. Her daughter reports most notably she seemed short of breath, generally fatigued and generally weak. She did not complain of any chest pain at that time. Her daughter assisted her back to bed. She reports she usually gets her up at about 7 AM to take her to [facility]. At 7 they got up and she just didn't really want to go she felt generally fatigued and seemed more short of breath.. She called EMS. Patient's room air approximation was 84% upon EMS arrival. Nurse had observed a slight droop on the right cheek. I discussed with the patient's daughter, she did not identify any focal neurologic symptoms that she identified during the course of these events. She does not feel that there was ever a specific area of motor dysfunction or obvious facial droop. Past Medical History:  Diagnosis Date  . ANEMIA   . Anemia of chronic disease   . Angina   . Breast cancer ([place]) 2000   right  . CAD 2000, 2011   MI 2000, NQWMI 09/2009  . CHF (congestive heart failure) ([place])   . Chronic kidney disease (CKD), stage IV (severe) ([place])   . Dementia   . DEPRESSION   . DIABETES MELLITUS, TYPE II    renal, neuro - peripheral neuropathy  . Hypercholesteremia   . HYPERTENSION   . Inferior MI ([place]) 2000   "quiet"  . INSOMNIA, CHRONIC   . Loose stools   . Neurodermatitis   . Non-Q wave myocardial infarction ([place]) 10/2009   /E-chart  . PVD   . Shortness of breath dyspnea     Patient Active Problem List   Diagnosis Date Noted  . Urinary retention   . Dyspnea   . Acute on chronic diastolic congestive heart failure ([place])   . CHF (congestive heart failure) ([place]) 02/07/2016  . Acute respiratory failure ([place]) 02/07/2016  . Uncontrolled type 2 diabetes mellitus with stage 4 chronic kidney disease, with long-term current use of insulin ([place])   . Hyperkalemia 07/18/2015  . Lethargy 06/04/2015  . Diabetic nephropathies ([place]) 05/27/2015  . Acute renal failure superimposed on stage 3 chronic kidney disease ([place]) 05/27/2015  . AKI (acute kidney injury) ([place])   . Hyperglycemia 05/26/2015  . Uncontrolled diabetes mellitus with hyperosmolarity, with long-term current use of insulin ([place]) 05/26/2015  . Non-compliant behavior 04/01/2015  . Syncope 11/18/2014  . Skin change   . Breast cancer ([place])   . Edema   . Hyperosmolar (nonketotic) coma ([place]) 04/01/2012  . Fracture of left toe 04/01/2012  . CKD (chronic kidney disease) stage 4, GFR 15-29 ml/min ([facility]) 04/01/2012  . Unstable angina ([place]) 11/21/2011  . Low back pain   . Depression 06/24/2009  . Dementia 06/24/2009  . INSOMNIA, CHRONIC 03/25/2009  . Hyperlipidemia 03/10/2009  . ANEMIA 03/10/2009  . Coronary atherosclerosis 03/10/2009  .  PVD 03/10/2009  . Uncontrolled type 2 diabetes with renal manifestation ([place]) 03/09/2009  . Essential hypertension 03/09/2009    Past Surgical History:  Procedure Laterality Date  . BREAST LUMPECTOMY  2000   right  . CAPSULECTOMY  12/27/2010   L eye  . CARDIAC SURGERY    . CATARACT EXTRACTION W/ INTRAOCULAR LENS  IMPLANT, BILATERAL    . COMBINED HYSTEROSCOPY DIAGNOSTIC / D&C  07/2004   /e-chart  . CORONARY ANGIOPLASTY WITH STENT PLACEMENT     "twice"  . CORONARY ARTERY BYPASS GRAFT  2000   CABG X1  . DILATION AND CURETTAGE OF UTERUS    . EYE SURGERY     Cataracts (R) eye 06/1999 & (L) eye 09/1999- [place]  . LEFT  HEART CATHETERIZATION WITH CORONARY ANGIOGRAM N/A 11/22/2011   Procedure: LEFT HEART CATHETERIZATION WITH CORONARY ANGIOGRAM;  Surgeon: Clent Demark, MD;  Location: [facility] CATH LAB;  Service: Cardiovascular;  Laterality: N/A;  . LEFT HEART CATHETERIZATION WITH CORONARY ANGIOGRAM N/A 10/03/2012   Procedure: LEFT HEART CATHETERIZATION WITH CORONARY ANGIOGRAM;  Surgeon: Clent Demark, MD;  Location: [facility] CATH LAB;  Service: Cardiovascular;  Laterality: N/A;    OB History    No data available       Home Medications    Prior to Admission medications   Medication Sig Start Date End Date Taking? Authorizing Provider  acetaminophen (TYLENOL) 325 MG tablet Take 650 mg by mouth daily as needed (back pain).   Yes [provider]  amLODipine (NORVASC) 10 MG tablet Take 10 mg by mouth daily.   Yes [provider]  aspirin 81 MG tablet Take 1 tablet (81 mg total) by mouth daily. 11/23/11  Yes Charolette Forward, MD  atorvastatin (LIPITOR) 20 MG tablet TAKE 1 TABLET BY MOUTH ONCE DAILY 05/05/15  Yes Hoyt Koch, MD  carvedilol (COREG) 3.125 MG tablet Take 1 tablet (3.125 mg total) by mouth 2 (two) times daily with a meal. Patient taking differently: Take 6.25 mg by mouth 2 (two) times daily with a meal.  03/12/16  Yes Ahmed, Chesley Mires, MD  clopidogrel (PLAVIX) 75 MG tablet Take 1 tablet (75 mg total) by mouth daily. 12/29/14  Yes Rowe Clack, MD  furosemide (LASIX) 40 MG tablet Take 1 tablet (40 mg total) by mouth daily. Patient taking differently: Take 30 mg by mouth daily.  03/12/16  Yes Ahmed, Chesley Mires, MD  hydrALAZINE (APRESOLINE) 10 MG tablet Take 15 mg by mouth 3 (three) times daily.   Yes [provider]  insulin detemir (LEVEMIR) 100 UNIT/ML injection Inject 0.25 mLs (25 Units total) into the skin 2 (two) times daily. Patient taking differently: Inject 20 Units into the skin 2 (two) times daily.  02/11/16  Yes Robbie Lis, MD  loperamide (IMODIUM A-D) 2 MG tablet  Take 2 mg by mouth every 4 (four) hours as needed for diarrhea or loose stools.   Yes [provider]  memantine (NAMENDA) 10 MG tablet Take 10 mg by mouth 2 (two) times daily.   Yes [provider]  pregabalin (LYRICA) 50 MG capsule Take 50 mg by mouth daily.   Yes [provider]  ranitidine (ZANTAC) 150 MG tablet Take 150 mg by mouth 2 (two) times daily.   Yes [provider]  sodium bicarbonate 650 MG tablet Take 650 mg by mouth 3 (three) times daily.   Yes [provider]  insulin aspart (NOVOLOG) 100 UNIT/ML injection Inject 0-15 Units into the skin 3 (three)  times daily with meals. Patient not taking: Reported on 01/17/2017 02/11/16   Robbie Lis, MD  ipratropium-albuterol (DUONEB) 0.5-2.5 (3) MG/3ML SOLN Take 3 mLs by nebulization every 4 (four) hours as needed. Patient not taking: Reported on 01/08/2017 02/11/16   Robbie Lis, MD  triamcinolone cream (KENALOG) 0.1 % APPLY TOPICALLY TWICE DAILY Patient not taking: Reported on 01/06/2017 06/15/15   Rowe Clack, MD    Family History Family History  Problem Relation Age of Onset  . Coronary artery disease Father     Social History Social History  Substance Use Topics  . Smoking status: Former Smoker    Packs/day: 1.00    Years: 20.00    Types: Cigarettes  . Smokeless tobacco: Never Used     Comment: 11/21/11 "quit smoking 30-40 years ago  . Alcohol use No     Comment: 11/21/11 "used to drink quit alot; last drink of alcohol maybe 30 years ago"     Allergies   Ace inhibitors   Review of Systems Review of Systems  Level by caveat cannot obtain review systems due to patient dementia. Physical Exam Updated Vital Signs BP (!) 125/54   Pulse 64   Resp (!) 21   Ht 5\' 3"  (1.6 m)   Wt 65.8 kg (145 lb)   SpO2 99%   BMI 25.69 kg/m   Physical Exam  Constitutional:  Patient is alert. She has mild increased work of breathing at rest. Nontoxic.  HENT:  Head:  Normocephalic and atraumatic.  Nose: Nose normal.  Mouth/Throat: Oropharynx is clear and moist.  Eyes: EOM are normal. Pupils are equal, round, and reactive to light.  Neck: Neck supple.  Cardiovascular: Normal rate, regular rhythm, normal heart sounds and intact distal pulses.   Pulmonary/Chest:  Mild increased work of breathing, basilar rails.  Abdominal: Soft. She exhibits no distension. There is no tenderness.  Musculoskeletal: Normal range of motion. She exhibits no edema or tenderness.  Neurological: She is alert. No cranial nerve deficit. She exhibits normal muscle tone. Coordination normal.  Skin: Skin is warm and dry.  Psychiatric: She has a normal mood and affect.     ED Treatments / Results  Labs (all labs ordered are listed, but only abnormal results are displayed) Labs Reviewed  COMPREHENSIVE METABOLIC PANEL - Abnormal; Notable for the following:       Result Value   Glucose, Bld 349 (*)    BUN 57 (*)    Creatinine, Ser 3.44 (*)    Calcium 8.7 (*)    Albumin 3.4 (*)    Alkaline Phosphatase 151 (*)    GFR calc non Af Amer 11 (*)    GFR calc Af Amer 13 (*)    All other components within normal limits  CBC - Abnormal; Notable for the following:    RBC 3.34 (*)    Hemoglobin 9.8 (*)    HCT 30.5 (*)    All other components within normal limits  CBG MONITORING, ED - Abnormal; Notable for the following:    Glucose-Capillary 319 (*)    All other components within normal limits  I-STAT TROPOININ, ED - Abnormal; Notable for the following:    Troponin i, poc 5.67 (*)    All other components within normal limits  PROTIME-INR  CBG MONITORING, ED    EKG  EKG Interpretation  Date/Time:  Monday January 09 2017 10:29:44 EDT Ventricular Rate:  66 PR Interval:    QRS Duration: 97 QT Interval:  410  QTC Calculation: 430 R Axis:   -29 Text Interpretation:  Sinus rhythm LVH with secondary repolarization abnormality Inferior infarct, old Anterior Q waves, possibly due to LVH  agree. flattening anterior T wave. V3 elevation Confirmed by Charlesetta Shanks [phone]) on 01/02/2017 11:29:18 AM       Radiology Dg Chest Portable 1 View  Result Date: 01/08/2017 CLINICAL DATA:  83 year old female with shortness of breath. Inferior myocardial infarction. EXAM: PORTABLE CHEST 1 VIEW COMPARISON:  05/24/2016 and earlier. FINDINGS: Portable AP semi upright view at 1132 hours. Chronic cardiomegaly. Veiling opacities at both lung bases appear increased compared to October. Increased pulmonary vascularity and bilateral pulmonary interstitial opacity. No pneumothorax. Visualized tracheal air column is within normal limits. Negative visible bowel gas pattern. IMPRESSION: 1. Acute pulmonary edema. 2. Small bilateral pleural effusions may have increased since 2017. 3. Chronic cardiomegaly. Electronically Signed   By: Genevie Ann M.D.   On: 01/06/2017 12:09    Procedures Procedures (including critical care time) CRITICAL CARE Performed by: Charlesetta Shanks   Total critical care time:30 minutes  Critical care time was exclusive of separately billable procedures and treating other patients.  Critical care was necessary to treat or prevent imminent or life-threatening deterioration.  Critical care was time spent personally by me on the following activities: development of treatment plan with patient and/or surrogate as well as nursing, discussions with consultants, evaluation of patient's response to treatment, examination of patient, obtaining history from patient or surrogate, ordering and performing treatments and interventions, ordering and review of laboratory studies, ordering and review of radiographic studies, pulse oximetry and re-evaluation of patient's condition. Medications Ordered in ED Medications  heparin ADULT infusion 100 units/mL (25000 units/247mL sodium chloride 0.45%) (800 Units/hr Intravenous New Bag/Given 01/20/2017 1220)  aspirin chewable tablet 324 mg (324 mg Oral Given  01/17/2017 1157)  heparin bolus via infusion 4,000 Units (4,000 Units Intravenous Bolus from Bag 01/21/2017 1220)     Initial Impression / Assessment and Plan / ED Course  I have reviewed the triage vital signs and the nursing notes.  Pertinent labs & imaging results that were available during my care of the patient were reviewed by me and considered in my medical decision making (see chart for details).    Consult: Cardiology Dr. Einar Gip Consult: Internal medicine resident teaching service for admission  Final Clinical Impressions(s) / ED Diagnoses   Final diagnoses:  NSTEMI (non-ST elevated myocardial infarction) [facility])  Severe comorbid illness    New Prescriptions New Prescriptions   No medications on file     Charlesetta Shanks, MD 01/28/17 1451

## 2017-01-10 ENCOUNTER — Inpatient Hospital Stay (HOSPITAL_COMMUNITY): Payer: Medicare (Managed Care)

## 2017-01-10 LAB — GLUCOSE, CAPILLARY
GLUCOSE-CAPILLARY: 88 mg/dL (ref 65–99)
GLUCOSE-CAPILLARY: 51 mg/dL — AB (ref 65–99)
Glucose-Capillary: 154 mg/dL — ABNORMAL HIGH (ref 65–99)
Glucose-Capillary: 111 mg/dL — ABNORMAL HIGH (ref 65–99)
Glucose-Capillary: 85 mg/dL (ref 65–99)
Glucose-Capillary: 123 mg/dL — ABNORMAL HIGH (ref 65–99)

## 2017-01-10 LAB — BASIC METABOLIC PANEL
Anion gap: 11 (ref 5–15)
BUN: 63 mg/dL — AB (ref 6–20)
CALCIUM: 8.9 mg/dL (ref 8.9–10.3)
CO2: 25 mmol/L (ref 22–32)
Chloride: 107 mmol/L (ref 101–111)
Creatinine, Ser: 3.58 mg/dL — ABNORMAL HIGH (ref 0.44–1.00)
GFR calc Af Amer: 12 mL/min — ABNORMAL LOW (ref >60)
GFR calc non Af Amer: 11 mL/min — ABNORMAL LOW (ref >60)
Glucose, Bld: 87 mg/dL (ref 65–99)
Potassium: 4 mmol/L (ref 3.5–5.1)
Sodium: 143 mmol/L (ref 135–145)

## 2017-01-10 LAB — CBC
HCT: 28.3 % — ABNORMAL LOW (ref 36.0–46.0)
Hemoglobin: 9.2 g/dL — ABNORMAL LOW (ref 12.0–15.0)
MCH: 29.8 pg (ref 26.0–34.0)
MCHC: 32.5 g/dL (ref 30.0–36.0)
MCV: 91.6 fL (ref 78.0–100.0)
PLATELETS: 243 10*3/uL (ref 150–400)
RBC: 3.09 MIL/uL — ABNORMAL LOW (ref 3.87–5.11)
RDW: 14.7 % (ref 11.5–15.5)
WBC: 11.8 10*3/uL — ABNORMAL HIGH (ref 4.0–10.5)

## 2017-01-10 LAB — TROPONIN I: Troponin I: 6.86 ng/mL (ref <0.03)

## 2017-01-10 LAB — HEPARIN LEVEL (UNFRACTIONATED): HEPARIN UNFRACTIONATED: 0.49 [IU]/mL (ref 0.30–0.70)

## 2017-01-10 LAB — MRSA PCR SCREENING: MRSA BY PCR: POSITIVE — AB

## 2017-01-10 MED ORDER — FUROSEMIDE 10 MG/ML IJ SOLN
60.0000 mg | Freq: Three times a day (TID) | INTRAMUSCULAR | Status: AC
  Administered 2017-01-10 – 2017-01-11 (×6): 60 mg via INTRAVENOUS
  Filled 2017-01-10 (×6): qty 6

## 2017-01-10 MED ORDER — FAMOTIDINE 20 MG PO TABS
20.0000 mg | ORAL_TABLET | Freq: Every day | ORAL | Status: DC
  Administered 2017-01-10 – 2017-01-12 (×3): 20 mg via ORAL
  Filled 2017-01-10 (×3): qty 1

## 2017-01-10 MED ORDER — MORPHINE SULFATE (PF) 2 MG/ML IV SOLN
INTRAVENOUS | Status: AC
  Filled 2017-01-10: qty 1

## 2017-01-10 MED ORDER — MORPHINE SULFATE (PF) 2 MG/ML IV SOLN
0.5000 mg | Freq: Once | INTRAVENOUS | Status: AC
  Administered 2017-01-10: 0.5 mg via INTRAVENOUS

## 2017-01-10 MED ORDER — FUROSEMIDE 10 MG/ML IJ SOLN
80.0000 mg | Freq: Once | INTRAMUSCULAR | Status: AC
  Administered 2017-01-10: 80 mg via INTRAVENOUS
  Filled 2017-01-10: qty 8

## 2017-01-10 NOTE — Progress Notes (Addendum)
Around midnight pt called out to urinate. Attempted to have pt stand, but pt got SOB and was unable to use BSC. Then had pt use bedpan. While cleaning her up, pt became acutely SOB, lungs clear with some crackles in bases. Sats 100% on 3L O2. MD notified and 80mg  Lasix IV given. Pt still very SOB, unable to lie flat and has to tripod. Rapid and MD notified. 0.5 mg morphine given. Foley placed using sterile technique to measure output. Pt placed on NRB mask, repositioned in bed. Pt now resting in bed. Will continue to closely monitor pt.

## 2017-01-10 NOTE — Progress Notes (Signed)
Subjective: Pt. Expressed difficulty breathing last night. Pt. Put on NRB mask from [place]. Sats. Maintained >95%.  Pt. Asleep this AM on rounds, breathing comfortably on non-rebreather. O2  Decreased to 4L during interview, pt. Continued to sat. >95% w/out SOB.   Objective: Vital signs in last 24 hours: Vitals:   01/10/17 0313 01/10/17 0350 01/10/17 0435 01/10/17 0800  BP:   (!) 157/55   Pulse:   66 (!) 53  Resp:   (!) 22   Temp:   98.6 F (37 C)   TempSrc:   Axillary   SpO2: 100% 100% 100%   Weight:   66.8 kg (147 lb 3.2 oz)   Height:       Weight change:   Intake/Output Summary (Last 24 hours) at 01/10/17 1136 Last data filed at 01/10/17 0658  Gross per 24 hour  Intake           149.07 ml  Output             1175 ml  Net         -1025.93 ml   Physical Exam: Gen: drowsy (just awoke from sleep) CV: RRR, systolic murmur, no S3 Pulm: CTAB, good movement of air, non-labored breathing Abdomen: normoactive bowel sounds, soft, nontender Extremities: no edema, no rashes, warm and perfused  Lab Results: Basic Metabolic Panel:  Recent Labs Lab 12/30/2016 1046 01/10/17 0755  NA 140 143  K 4.8 4.0  CL 105 107  CO2 22 25  GLUCOSE 349* 87  BUN 57* 63*  CREATININE 3.44* 3.58*  CALCIUM 8.7* 8.9   Liver Function Tests:  Recent Labs Lab 01/05/2017 1046  AST 37  ALT 22  ALKPHOS 151*  BILITOT 0.6  PROT 8.0  ALBUMIN 3.4*   CBC:  Recent Labs Lab 12/30/2016 1046  WBC 10.0  HGB 9.8*  HCT 30.5*  MCV 91.3  PLT 262   Cardiac Enzymes:  Recent Labs Lab 12/29/2016 1826 12/28/2016 2301 01/10/17 0755  TROPONINI 7.46* 8.40* 6.86*   BNP: 3,582 yesterday (300-400 in past).  CBG:  Recent Labs Lab 01/17/2017 1045 01/18/2017 1648 01/16/2017 2035 01/10/17 0615 01/10/17 1115  GLUCAP 319* 331* 269* 88 154*   Coagulation:  Recent Labs Lab 12/28/2016 1046  LABPROT 15.0  INR 1.17    Medications:  Scheduled Meds: . amLODipine  10 mg Oral Daily  . aspirin EC  81 mg Oral  Daily  . atorvastatin  20 mg Oral Daily  . carvedilol  6.25 mg Oral BID WC  . famotidine  20 mg Oral Daily  . furosemide  60 mg Intravenous TID  . hydrALAZINE  15 mg Oral TID  . insulin aspart  0-9 Units Subcutaneous TID WC  . insulin aspart  3 Units Subcutaneous TID WC  . insulin detemir  16 Units Subcutaneous BID  . memantine  10 mg Oral BID  . pregabalin  50 mg Oral Daily  . sodium bicarbonate  650 mg Oral TID  . ticagrelor  90 mg Oral BID   Continuous Infusions: . heparin 800 Units/hr (01/12/2017 1220)   PRN Meds:.acetaminophen, nitroGLYCERIN, ondansetron (ZOFRAN) IV Assessment/Plan: Principal Problem:   NSTEMI (non-ST elevated myocardial infarction) ([place]) Active Problems:   Uncontrolled type 2 diabetes with renal manifestation (HCC)   Hyperlipidemia   ANEMIA   Essential hypertension   Coronary atherosclerosis   Dementia   CKD (chronic kidney disease) stage 4, GFR 15-29 ml/min (HCC)   CHF (congestive heart failure) ([place])  [name] is an 83  y/o woman w/ PMH of CHF, HTN, type 2 diabetes mellitus, CKD Stage 4 being managed for NSTEMI in setting of acute decompensated heart failure.  NSTEMI: Pt. Denies chest pain this AM and denies SOB. Troponin downtrending this AM. ECG this AM largely unchanged from presentation. Clinically improving, continue to watch.  -aspirin 81 mg -ticagrelor 90 mg--->per cards -atorvastatin 20 mg -unfractionated heparin -nitroglycerin 0.4 SL PRN  CHF: BNP 3,582 yesterday (300-400 in past). Clinically improved today, CXR shows improving interstitial edema. Furosemide given intermittently for diuresis which seems to be effective, pt. Now net negative approx. 1 L. Cardiology now recommends scheduled furosemide. We appreciate their help with this case. O2 reduced to 4L this AM, pt. Tolerating well   -carvedilol 3.125 mg BID--->down from 6.25 mg BID per Cardiology  -Strict I&Os---->foley placed last night  HTN: Essential HTN. Baseline 160-170/80-90.  Currently 150s/50s. Goal of baseline or lower.   -home amlodipine -home hydralazine   Dementia: Family reports pt. Is at baseline, no acute changes.   -home memantine  Diarrhea: Recent bouts of diarrhea (past few days before presentation).  -home loperamide PRN  GERD: No acute complaints. Stable  -home ranitidine    Neurodermatitis: Stable.  -Triamcinolone  Dispo: Floor FEN/GI: ranitidine Diet: Heart healthy, low-sodium DVT ppx: UF heparin   This is a Careers information officer Note.  The care of the patient was discussed with Dr. Hetty Ely and the assessment and plan formulated with their assistance.  Please see their attached note for official documentation of the daily encounter.   LOS: 1 day   Jillyn Hidden, Medical Student 01/10/2017, 11:36 AM

## 2017-01-10 NOTE — Progress Notes (Signed)
Got from report that pt IV site been bleeding on and off since 1830 when they remove the old one in her RAC and one was place in her RFA . When we got in the room the outgoing nurse and I saw that she was still bleeding pressure was applied heparin sttoped @ 2000 and right arm elevated. Dr Pattricia Boss was notified she recommended we keep heparin off till 2100

## 2017-01-10 NOTE — Progress Notes (Signed)
ANTICOAGULATION CONSULT NOTE - Follow-up Consult  Pharmacy Consult for heparin Indication: chest pain/ACS  Allergies  Allergen Reactions  . Ace Inhibitors Cough    Patient Measurements: Height: 5\' 3"  (160 cm) Weight: 147 lb 3.2 oz (66.8 kg) IBW/kg (Calculated) : 52.4 Heparin Dosing Weight: 65.6kg  Vital Signs: Temp: 98.6 F (37 C) (06/19 0435) Temp Source: Axillary (06/19 0435) BP: 157/55 (06/19 0435) Pulse Rate: 53 (06/19 0800)  Labs:  Recent Labs  01/11/2017 1046 01/02/2017 1826 01/06/2017 2301 01/10/17 0755  HGB 9.8*  --   --   --   HCT 30.5*  --   --   --   PLT 262  --   --   --   LABPROT 15.0  --   --   --   INR 1.17  --   --   --   HEPARINUNFRC  --   --  0.48 0.49  CREATININE 3.44*  --   --  3.58*  TROPONINI  --  7.46* 8.40* 6.86*    Estimated Creatinine Clearance: 10.6 mL/min (A) (by C-G formula based on SCr of 3.58 mg/dL (H)).  Assessment: 37 yof on heparin for NSTEMI. Not on anticoagulation PTA. Heparin level remains therapeutic (0.49) on gtt at 800 units/hr. Hg 9.8, plt wnl. No bleeding noted.  Goal of Therapy:  Heparin level 0.3-0.7 units/ml Monitor platelets by anticoagulation protocol: Yes   Plan:  Continue heparin at 800 units/hr Daily heparin level/CBC Monitor for s/sx bleeding F/u Cardiology plans   Elicia Lamp, PharmD, BCPS Clinical Pharmacist Rx Phone # for today: [phone] After 3:30PM, please call Main Rx: [phone] 01/10/2017 9:46 AM

## 2017-01-10 NOTE — Progress Notes (Signed)
01/08/2017 1630 Received pt to room 2W08 from ED.  Pt is A&O, some C/O SOB.  Tele monitor applied and CCMD notified.  Oriented to room, call light and bed.  Call bell in reach, family at bedside. [place]

## 2017-01-10 NOTE — Progress Notes (Addendum)
   Subjective: That she feels comfortable today continues to deny chest pain. Overnight she did have worsening of her shortness of breath and required high flow nasal cannula and morphine for relief. This morning her oxygen saturation is 100% on nonrebreather but she does appear uncomfortable breathing. We transitioned her to nasal cannula and her oxygen saturation remained at 100%.  Objective:  Vital signs in last 24 hours: Vitals:   01/10/17 0350 01/10/17 0435 01/10/17 0800 01/10/17 1459  BP:  (!) 157/55  (!) 136/56  Pulse:  66 (!) 53 76  Resp:  (!) 22  17  Temp:  98.6 F (37 C)    TempSrc:  Axillary  Oral  SpO2: 100% 100%  97%  Weight:  147 lb 3.2 oz (66.8 kg)    Height:       Physical Exam  Constitutional: She is well-developed, well-nourished, and in no distress. No distress.  HENT:  Head: Normocephalic and atraumatic.  Eyes: Right eye exhibits no discharge. Left eye exhibits no discharge. No scleral icterus.  Cardiovascular: Normal rate and regular rhythm.   No murmur heard. Pulmonary/Chest: Breath sounds normal. She has no wheezes. She has no rales.  Bibasilar crackles   Abdominal: Soft. Bowel sounds are normal. She exhibits no distension. There is no tenderness. There is no guarding.  Neurological:  Lethargic but responsive  Skin: Skin is warm and dry. She is not diaphoretic.    Assessment/Plan:  Principal Problem:   NSTEMI (non-ST elevated myocardial infarction) ([place]) Active Problems:   Uncontrolled type 2 diabetes with renal manifestation (HCC)   Hyperlipidemia   ANEMIA   Essential hypertension   Coronary atherosclerosis   Dementia   CKD (chronic kidney disease) stage 4, GFR 15-29 ml/min (HCC)   CHF (congestive heart failure) ([place])  NSTEMI  Continues to deny chest pain or pressure. Does have the sensation of shortness of breath but SpO2 is at 97% on room air this afternoon. Troponins peaked at 8.4 then down trended. I suspect a component of this may be demand  ischemia in the setting of diastolic heart failure exacerbation.  She remains on heparin gtt today, Continue aspirin and ticagrelor  Continue atorvastatin Cardiology is following we appreciate their recommendations Follow up echocardiogram  Uncontrolled type 2 diabetes Last hemoglobin A1c 01/2016 8.1. Home medications include sliding scale NovoLog 0-15 units 3 times a day, Levemir 25 units twice a day. NovoLog 3 units 3 times daily with sensitive sliding scale Levemir 16 units twice daily CBG monitoring  CHF Remains euvolemic appearing on exam however BNP was 3500 and chest x-ray shows pulmonary edema. Pulmonary edema on x-ray is improved today.  Continue daily weights and strict I&Os Furosemide 60 mg 3 times daily Carvedilol decreased from 6.5 mg to 3.125 mg twice daily  Hypertension  Normotensive today  - continue home med amlodipine and hydralazine   Dementia Stable   continue home medication memantine 10 mg twice daily Delirium precautions   Dispo: Anticipated discharge in approximately 1-2 day(s).   Ledell Noss, MD 01/10/2017, 4:33 PM Pager: [phone]

## 2017-01-10 NOTE — Progress Notes (Signed)
Subjective:  Complains of being fatigued and dyspneic. No chest pain.  Objective:  Vital Signs in the last 24 hours: Temp:  [98.6 F (37 C)-99.1 F (37.3 C)] 98.6 F (37 C) (06/19 0435) Pulse Rate:  [51-66] 53 (06/19 0800) Resp:  [15-26] 22 (06/19 0435) BP: (116-157)/(47-63) 157/55 (06/19 0435) SpO2:  [94 %-100 %] 100 % (06/19 0435) Weight:  [64.5 kg (142 lb 4.8 oz)-66.8 kg (147 lb 3.2 oz)] 66.8 kg (147 lb 3.2 oz) (06/19 0435)  Intake/Output from previous day: 06/18 0701 - 06/19 0700 In: 149.1 [I.V.:149.1] Out: 1175 [Urine:1175]  Physical Exam:   General appearance: alert, cooperative, fatigued, mild distress and slowed mentation Eyes: negative findings: lids and lashes normal Neck: JVD up the angle of jaw Neck: JVP - normal, carotids 2+= without bruits Resp: clear to auscultation bilaterally Chest wall: no tenderness Cardio: regular rate and rhythm, S1, S2 normal, no murmur, click, rub or gallop GI: soft, non-tender; bowel sounds normal; no masses,  no organomegaly and appears less distended compared to yesterday Extremities: extremities normal, atraumatic, no cyanosis or edema Vascular exam: Carotid normal, femoral normal, popliteal faint bilateral, absent pedal pulses.   Lab Results: BMP  Recent Labs  03/11/16 0229 03/12/16 0241 01/12/2017 1046  NA 140 139 140  K 4.6 4.3 4.8  CL 107 106 105  CO2 25 25 22   GLUCOSE 155* 134* 349*  BUN 29* 34* 57*  CREATININE 2.70* 2.65* 3.44*  CALCIUM 8.4* 8.5* 8.7*  GFRNONAA 15* 15* 11*  GFRAA 17* 18* 13*    CBC  Recent Labs Lab 01/19/2017 1046  WBC 10.0  RBC 3.34*  HGB 9.8*  HCT 30.5*  PLT 262  MCV 91.3  MCH 29.3  MCHC 32.1  RDW 14.4    HEMOGLOBIN A1C Lab Results  Component Value Date   HGBA1C 8.1 (H) 02/08/2016   MPG 186 02/08/2016    Cardiac Panel (last 3 results)  Recent Labs  03/11/16 0024 01/06/2017 1826 01/08/2017 2301  TROPONINI 0.07* 7.46* 8.40*   BNP (last 3 results)  Recent Labs   02/07/16 0642 03/09/16 0840 01/21/2017 1826  BNP 292.9* 416.4* 3,582.2*    ProBNP (last 3 results) No results for input(s): PROBNP in the last 8760 hours.    TSH No results for input(s): TSH in the last 8760 hours.  Lipid Panel     Component Value Date/Time   CHOL 132 11/07/2013 1125   TRIG 114.0 11/07/2013 1125   TRIG 130 10/12/2009   HDL 31.10 (L) 11/07/2013 1125   CHOLHDL 4 11/07/2013 1125   VLDL 22.8 11/07/2013 1125   LDLCALC 78 11/07/2013 1125     Hepatic Function Panel  Recent Labs  01/19/2017 1046  PROT 8.0  ALBUMIN 3.4*  AST 37  ALT 22  ALKPHOS 151*  BILITOT 0.6    Imaging: Dg Chest 2 View  Result Date: 01/10/2017 CLINICAL DATA:  Shortness of Breath EXAM: CHEST  2 VIEW COMPARISON:  12/23/2016 FINDINGS: Cardiac shadow is again enlarged in size. The previously seen pulmonary edema has improved somewhat in the interval although persistent interstitial edema is noted. Small bilateral pleural effusions are seen posteriorly. No focal confluent infiltrate is noted. IMPRESSION: CHF with some improvement when compare with the prior exam. Electronically Signed   By: Inez Catalina M.D.   On: 01/10/2017 07:49   Dg Chest Portable 1 View  Result Date: 01/08/2017 CLINICAL DATA:  83 year old female with shortness of breath. Inferior myocardial infarction. EXAM: PORTABLE CHEST 1 VIEW COMPARISON:  05/24/2016 and earlier. FINDINGS: Portable AP semi upright view at 1132 hours. Chronic cardiomegaly. Veiling opacities at both lung bases appear increased compared to October. Increased pulmonary vascularity and bilateral pulmonary interstitial opacity. No pneumothorax. Visualized tracheal air column is within normal limits. Negative visible bowel gas pattern. IMPRESSION: 1. Acute pulmonary edema. 2. Small bilateral pleural effusions may have increased since 2017. 3. Chronic cardiomegaly. Electronically Signed   By: Genevie Ann M.D.   On: 01/01/2017 12:09    Cardiac Studies:  EKG:  12/24/2016: Normal sinus rhythm, left axis deviation, LVH with repolarization of rheumatic. No change in T-wave inversion from prior EKG 03/10/2016.  Telemetry 01/10/2017: Normal sinus rhythm, sinus bradycardia, lowest heart rate 38 bpm, paroxysmal episodes of junctional escape rhythm.  Echocardiogram 03/10/2016: Normal LV systolic function without wall motion of the normality, EF 55-60%. Grade 1 diastolic dysfunction. Moderate MR, left atrium moderately dilated. Mild TR and moderate pulmonary hypertension, PA pressure 57 mmHg.  Coronary angiogram 11/22/2011: Left main had 60% distal stenosis. LAD has 10-15% proximal and 60-65% mid stenosis. Distal stent in LAD is patent. Diagonal #1 is very small. Diagonal #2 has 70% ostial stenosis. Stented portion in the proximal and mid portion is patent. Ramus is very small which is diffusely diseased. Left circumflex has 30% proximal and 30-40% distal diffuse stenosis at prior PTCA site. OM-1 is very, very small. OM-2 has 75-80% mid stenosis at prior PTCA site. Vessel is very small. RCA has 20-30% mid and 30-40% distal stenosis. PDA is small which has 70% proximal stenosis and 30-40% distal diffuse stenosis. PLV branch is also very small which has 95% ostial stenosis at the bifurcation with PDA.  Scheduled Meds: . amLODipine  10 mg Oral Daily  . aspirin EC  81 mg Oral Daily  . atorvastatin  20 mg Oral Daily  . carvedilol  6.25 mg Oral BID WC  . famotidine  20 mg Oral Daily  . hydrALAZINE  15 mg Oral TID  . insulin aspart  0-9 Units Subcutaneous TID WC  . insulin aspart  3 Units Subcutaneous TID WC  . insulin detemir  16 Units Subcutaneous BID  . memantine  10 mg Oral BID  . pregabalin  50 mg Oral Daily  . sodium bicarbonate  650 mg Oral TID  . ticagrelor  90 mg Oral BID   Continuous Infusions: . heparin 800 Units/hr (01/18/2017 1220)   PRN Meds:.acetaminophen, nitroGLYCERIN, ondansetron (ZOFRAN) IV   Assessment/Plan:  1. NSTEMI 2.  CAD native vessels with remote PCI to all three coronary arteries and moderate to severe small vessel disease by angiogram 2013. 3. Acute on Chronic diastolic heart failure 4. DM 2 uncontrolled with Vascular and renal complications, CKD stage 4. 5. Asymptomatic sinus arrest with junctional escape, no significant pauses. 6. Anemia of chronic disease 7. Hypertension 8. Hyperliipidemia  Recommendation: Patient in mild respiratory distress and needing nonrebreather oxygen. In acute heart failure with elevated BNP. We will schedule furosemide 3 times a day for now, continue to follow up on the s. creatinine. I have discontinued clopidogrel and switched to Brilinta, also decrease the carvedilol from 6.5 mg to 3.125 mg twice a day junctional rhythm although asymptomatic. She is now DO NOT RESUSCITATE.  Adrian Prows, M.D. 01/10/2017, 8:53 AM [facility] Pager: [phone] Office: [phone] If no answer: [phone]

## 2017-01-10 NOTE — Significant Event (Signed)
Rapid Response Event Note  Overview: Time Called: 0125 Arrival Time: 0130 Event Type: Respiratory  Initial Focused Assessment: Called by bedside RN about patient having respiratory distress and perhaps needing BIPAP support.  RT was at the bedside.  Upon arrival, patient was sitting up on the side of bed, leaning over the bedside table and she appeared mild distress.  Lung sounds were clear in the upper and mild crackles in the lowers, good air movement.  Patient would state that she was not breathing well.  Patient was on [place] 4L and saturations were 100%. Prior to my arrival, patient received lasix 40mg  IV at 1950 and lasix 80mg  IV at 0116 and patient has voided since then but has been incontinent.  Patient has some dementia baseline but was talking and following commands. VS okay, mild hypertension, patient was anxious and uncomfortable at times. + Pulses.   Patient was made a DNR earlier today.  Interventions: -- 0.5mg  Morphine IV for respiratory discomfort -- Foley was inserted for accurate I and O  -- patient was placed on NRB for comfort.  Plan of Care (if not transferred): -- Patient improved on a NRB, patient's was not as labored and SOB. -- Will follow up as needed.  Event Summary: Name of Physician Notified: Dr. Charlynn Grimes FMTS at [phone]    at    Outcome: Stayed in room and stabalized  Event End Time: [place], Delice Lesch

## 2017-01-10 NOTE — Progress Notes (Signed)
Inpatient Diabetes Program Recommendations  AACE/ADA: New Consensus Statement on Inpatient Glycemic Control (2015)  Target Ranges:  Prepandial:   less than 140 mg/dL      Peak postprandial:   less than 180 mg/dL (1-2 hours)      Critically ill patients:  140 - 180 mg/dL   Lab Results  Component Value Date   GLUCAP 88 01/10/2017   HGBA1C 8.1 (H) 02/08/2016    Review of Glycemic Control Results for [name], [name] (MRN [id number]) as of 01/10/2017 11:03  Ref. Range 01/04/2017 10:45 12/24/2016 16:48 01/08/2017 20:35 01/10/2017 06:15  Glucose-Capillary Latest Ref Range: 65 - 99 mg/dL 319 (H) 331 (H) 269 (H) 88   Diabetes history: DM2 Outpatient Diabetes medications: Levemir 20 units bid Current orders for Inpatient glycemic control: Levemir 16 bid + Novolog 3 units tid  Inpatient Diabetes Program Recommendations:  Please consider: A1c to determine prehospital glycemic control. Will follow.  Thank you, Nani Gasser. Aubreyanna Dorrough, RN, MSN, CDE  Diabetes Coordinator Inpatient Glycemic Control Team Team Pager [phone] (8am-5pm) 01/10/2017 11:09 AM

## 2017-01-10 NOTE — Progress Notes (Signed)
Internal Medicine Attending:   I saw and examined the patient. I reviewed the resident's note and I agree with the resident's findings and plan as documented in the resident's note. On AM rounds today nephew at bedside.  Patient was asleep but appeared mildly uncomfortable on NRB at 10L, she was slowly arroused and denied any SOB or chest pain.  Bedside pulse ox was at 100%. I deescalated her to [place] at 6L prior to leaving and she was still at 100%.  On exam today she appeared to occasionally take an extra short shallow breath but otherwise was not in distress, lungs revealed mildly diminished basilar breath sounds but were otherwise clear, heart rate was mildly bradycardic but regular, she had trace bilateral pedal edema.  A/P NSTEMI with Acute on Chronic diastolic CHF exacerbation - Medical management of NSTEMI with DAPT, heparin, statin (given her age I think continuing 20mg  of atorvastatin is sufficient), and coreg.  A foley was placed overnight to accurately measure I&O, agree with continued diuresis.  Uncontrolled Type 2 DM -Agree with Levemir and low dose mealtime Novolog.

## 2017-01-10 NOTE — Progress Notes (Signed)
Paged by nurse that family is concerned about patient's breathing.  I went to evaluate patient, no family at the bedside.  Patient appeared uncomfortable breathing.  Her O2 satts was 100% on 2L [place] and lungs sounded clear to auscultation.  This appears consistent with exam earlier in the day, although I did not appreciate bibasilar crackles.   She is continuing to be diuresed for her pulmonary edema.  It appears there is an anxiety component to her shortness of breath.  Discussed patient with the night team. Will continue to monitor

## 2017-01-10 NOTE — Progress Notes (Addendum)
Attempted to wean pt back to nasal cannula x2. Pt immediately becomes SOB. Put Venturi mask on at 12L/min. 100% O2 sats. Pt tolerating mask well. Will continue to monitor.  During rounding, pt informed RN that she is still SOB. Put NRB mask back and pt now breathing easier and sleeping.

## 2017-01-10 NOTE — Progress Notes (Signed)
Paged about respiratory distress with rapid response called. Upon arrival to the room, patient was sitting at the edge of bed in some moderate respiratory distress. She reports she 'cannot breathe.' Denies any chest pain. On exam, she does have bibasilar crackles present but upper lung fields clear with good air movement. Heart with RRR. Maintaining O2 sat >95% on 3L [place].   On admission she had a BNP of 3500 with CXR notable for pulmonary edema with small bilateral pleural effusions. Received Lasix 40 mg IV initially and another dose of 80 mg IV approximately half an hour before paged. Unable to document I&O due to incontinence.   Plan: Patient visibly anxious with complaints of being unable to breath. Believe this is secondary to her pleural edema but appears to be ventilating well. Gave morphine 0.5 mg IV x 1 with some improvement in symptoms. Foley placed with ~600 mL UOP. Will continue to monitor I&O. NRB placed for patient comfort. Will continue to monitor overnight and suspect symptoms will improve as she continues to diurese.

## 2017-01-11 ENCOUNTER — Inpatient Hospital Stay (HOSPITAL_COMMUNITY): Payer: Medicare (Managed Care)

## 2017-01-11 DIAGNOSIS — R0600 Dyspnea, unspecified: Secondary | ICD-10-CM

## 2017-01-11 LAB — ECHOCARDIOGRAM COMPLETE
Height: 63 in
WEIGHTICAEL: 2273.6 [oz_av]

## 2017-01-11 LAB — GLUCOSE, CAPILLARY
GLUCOSE-CAPILLARY: 187 mg/dL — AB (ref 65–99)
Glucose-Capillary: 295 mg/dL — ABNORMAL HIGH (ref 65–99)
Glucose-Capillary: 252 mg/dL — ABNORMAL HIGH (ref 65–99)
Glucose-Capillary: 210 mg/dL — ABNORMAL HIGH (ref 65–99)

## 2017-01-11 LAB — BRAIN NATRIURETIC PEPTIDE: B Natriuretic Peptide: 4345.5 pg/mL — ABNORMAL HIGH (ref 0.0–100.0)

## 2017-01-11 LAB — BASIC METABOLIC PANEL
Anion gap: 14 (ref 5–15)
BUN: 60 mg/dL — ABNORMAL HIGH (ref 6–20)
CHLORIDE: 105 mmol/L (ref 101–111)
CO2: 24 mmol/L (ref 22–32)
CREATININE: 3.68 mg/dL — AB (ref 0.44–1.00)
Calcium: 8.5 mg/dL — ABNORMAL LOW (ref 8.9–10.3)
GFR calc non Af Amer: 10 mL/min — ABNORMAL LOW (ref >60)
GFR, EST AFRICAN AMERICAN: 12 mL/min — AB (ref >60)
Glucose, Bld: 177 mg/dL — ABNORMAL HIGH (ref 65–99)
POTASSIUM: 3.8 mmol/L (ref 3.5–5.1)
Sodium: 143 mmol/L (ref 135–145)

## 2017-01-11 LAB — HEMOGLOBIN A1C
Hgb A1c MFr Bld: 10 % — ABNORMAL HIGH (ref 4.8–5.6)
MEAN PLASMA GLUCOSE: 240 mg/dL

## 2017-01-11 LAB — HEPARIN LEVEL (UNFRACTIONATED): Heparin Unfractionated: 0.31 IU/mL (ref 0.30–0.70)

## 2017-01-11 MED ORDER — INSULIN DETEMIR 100 UNIT/ML ~~LOC~~ SOLN
10.0000 [IU] | Freq: Two times a day (BID) | SUBCUTANEOUS | Status: DC
  Administered 2017-01-11 – 2017-01-12 (×2): 10 [IU] via SUBCUTANEOUS
  Filled 2017-01-11 (×2): qty 0.1

## 2017-01-11 MED ORDER — CARVEDILOL 3.125 MG PO TABS
3.1250 mg | ORAL_TABLET | Freq: Two times a day (BID) | ORAL | Status: DC
  Administered 2017-01-11 – 2017-01-12 (×2): 3.125 mg via ORAL
  Filled 2017-01-11 (×2): qty 1

## 2017-01-11 NOTE — Progress Notes (Signed)
Inpatient Diabetes Program Recommendations  AACE/ADA: New Consensus Statement on Inpatient Glycemic Control (2015)  Target Ranges:  Prepandial:   less than 140 mg/dL      Peak postprandial:   less than 180 mg/dL (1-2 hours)      Critically ill patients:  140 - 180 mg/dL   Lab Results  Component Value Date   GLUCAP 295 (H) 01/11/2017   HGBA1C 10.0 (H) 01/10/2017    Review of Glycemic Control Results for GLENDELL, SCHLOTTMAN (MRN [id number]) as of 01/11/2017 15:14  Ref. Range 01/10/2017 21:08 01/10/2017 21:42 01/10/2017 22:54 01/11/2017 05:42 01/11/2017 11:51  Glucose-Capillary Latest Ref Range: 65 - 99 mg/dL 51 (L) 85 123 (H) 187 (H) 295 (H)   Diabetes history: DM2 Outpatient Diabetes medications: Levemir 20 units bid + Novolog 0-15 units MC tid (reports not taking) Current orders for Inpatient glycemic control: Levemir 16 bid + Novolog 3 units tid + Novolog correction 0-9 units tid  Inpatient Diabetes Program Recommendations:  Noted hypoglycemia last pm. Patient did not receive am Levemir. Please consider: -Decrease Levemir to 10 units bid (50% home dose) -D/C Novolog meal coverage until eating more regularly  Thank you, Bethena Roys E. Arnett Duddy, RN, MSN, CDE  Diabetes Coordinator Inpatient Glycemic Control Team Team Pager [phone] (8am-5pm) 01/11/2017 3:18 PM

## 2017-01-11 NOTE — Progress Notes (Signed)
Low bed bundle implemented

## 2017-01-11 NOTE — Progress Notes (Signed)
  Echocardiogram 2D Echocardiogram has been performed.  [name] 01/11/2017, 1:55 PM

## 2017-01-11 NOTE — Progress Notes (Signed)
Subjective:  Complains of being fatigued and dyspnea improved. No chest pain.  Objective:  Vital Signs in the last 24 hours: Temp:  [98 F (36.7 C)-98.6 F (37 C)] 98.4 F (36.9 C) (06/20 0412) Pulse Rate:  [58-82] 82 (06/20 0412) Resp:  [17-18] 18 (06/20 0412) BP: (125-151)/(38-60) 142/38 (06/20 0412) SpO2:  [97 %-100 %] 99 % (06/20 0412) Weight:  [64.5 kg (142 lb 1.6 oz)] 64.5 kg (142 lb 1.6 oz) (06/20 0412)  Intake/Output from previous day: 06/19 0701 - 06/20 0700 In: 424.3 [P.O.:240; I.V.:184.3] Out: 1200 [Urine:1200]  Physical Exam:   General appearance: alert, cooperative, fatigued, mild distress and slowed mentation Eyes: negative findings: lids and lashes normal Neck: JVD up the angle of jaw Neck: JVP - normal, carotids 2+= without bruits Resp: bibasilar crackles present Chest wall: no tenderness Cardio: regular rate and rhythm, S1, S2 normal, no murmur, click, rub or gallop GI: soft, non-tender; bowel sounds normal; no masses,  no organomegaly Extremities: extremities normal, atraumatic, no cyanosis or edema Vascular exam: Carotid normal, femoral normal, popliteal faint bilateral, absent pedal pulses.   Lab Results: BMP  Recent Labs  01/18/2017 1046 01/10/17 0755 01/11/17 0322  NA 140 143 143  K 4.8 4.0 3.8  CL 105 107 105  CO2 22 25 24   GLUCOSE 349* 87 177*  BUN 57* 63* 60*  CREATININE 3.44* 3.58* 3.68*  CALCIUM 8.7* 8.9 8.5*  GFRNONAA 11* 11* 10*  GFRAA 13* 12* 12*    CBC  Recent Labs Lab 01/10/17 2056  WBC 11.8*  RBC 3.09*  HGB 9.2*  HCT 28.3*  PLT 243  MCV 91.6  MCH 29.8  MCHC 32.5  RDW 14.7    HEMOGLOBIN A1C Lab Results  Component Value Date   HGBA1C 10.0 (H) 01/10/2017   MPG 240 01/10/2017    Cardiac Panel (last 3 results)  Recent Labs  01/17/2017 1826 01/02/2017 2301 01/10/17 0755  TROPONINI 7.46* 8.40* 6.86*   BNP (last 3 results)  Recent Labs  03/09/16 0840 01/04/2017 1826 01/11/17 0322  BNP 416.4* 3,582.2*  4,345.5*    ProBNP (last 3 results) No results for input(s): PROBNP in the last 8760 hours.    TSH No results for input(s): TSH in the last 8760 hours.  Lipid Panel     Component Value Date/Time   CHOL 132 11/07/2013 1125   TRIG 114.0 11/07/2013 1125   TRIG 130 10/12/2009   HDL 31.10 (L) 11/07/2013 1125   CHOLHDL 4 11/07/2013 1125   VLDL 22.8 11/07/2013 1125   LDLCALC 78 11/07/2013 1125     Hepatic Function Panel  Recent Labs  01/13/2017 1046  PROT 8.0  ALBUMIN 3.4*  AST 37  ALT 22  ALKPHOS 151*  BILITOT 0.6    Imaging: Dg Chest 2 View  Result Date: 01/10/2017 CLINICAL DATA:  Shortness of Breath EXAM: CHEST  2 VIEW COMPARISON:  12/30/2016 FINDINGS: Cardiac shadow is again enlarged in size. The previously seen pulmonary edema has improved somewhat in the interval although persistent interstitial edema is noted. Small bilateral pleural effusions are seen posteriorly. No focal confluent infiltrate is noted. IMPRESSION: CHF with some improvement when compare with the prior exam. Electronically Signed   By: Inez Catalina M.D.   On: 01/10/2017 07:49   Dg Chest Portable 1 View  Result Date: 01/04/2017 CLINICAL DATA:  83 year old female with shortness of breath. Inferior myocardial infarction. EXAM: PORTABLE CHEST 1 VIEW COMPARISON:  05/24/2016 and earlier. FINDINGS: Portable AP semi upright view at 1132  hours. Chronic cardiomegaly. Veiling opacities at both lung bases appear increased compared to October. Increased pulmonary vascularity and bilateral pulmonary interstitial opacity. No pneumothorax. Visualized tracheal air column is within normal limits. Negative visible bowel gas pattern. IMPRESSION: 1. Acute pulmonary edema. 2. Small bilateral pleural effusions may have increased since 2017. 3. Chronic cardiomegaly. Electronically Signed   By: Genevie Ann M.D.   On: 12/23/2016 12:09    Cardiac Studies:  EKG: 01/10/2017: Normal sinus rhythm, left axis deviation, LVH with  repolarization of rheumatic. No change in T-wave inversion from prior EKG 03/10/2016.  Telemetry 01/10/2017: Normal sinus rhythm, sinus bradycardia, lowest heart rate 38 bpm, paroxysmal episodes of junctional escape rhythm.  Echocardiogram 03/10/2016: Normal LV systolic function without wall motion of the normality, EF 55-60%. Grade 1 diastolic dysfunction. Moderate MR, left atrium moderately dilated. Mild TR and moderate pulmonary hypertension, PA pressure 57 mmHg.  Coronary angiogram 11/22/2011: Left main had 60% distal stenosis. LAD has 10-15% proximal and 60-65% mid stenosis. Distal stent in LAD is patent. Diagonal #1 is very small. Diagonal #2 has 70% ostial stenosis. Stented portion in the proximal and mid portion is patent. Ramus is very small which is diffusely diseased. Left circumflex has 30% proximal and 30-40% distal diffuse stenosis at prior PTCA site. OM-1 is very, very small. OM-2 has 75-80% mid stenosis at prior PTCA site. Vessel is very small. RCA has 20-30% mid and 30-40% distal stenosis. PDA is small which has 70% proximal stenosis and 30-40% distal diffuse stenosis. PLV branch is also very small which has 95% ostial stenosis at the bifurcation with PDA.  Scheduled Meds: . amLODipine  10 mg Oral Daily  . aspirin EC  81 mg Oral Daily  . atorvastatin  20 mg Oral Daily  . carvedilol  6.25 mg Oral BID WC  . famotidine  20 mg Oral Daily  . furosemide  60 mg Intravenous TID  . hydrALAZINE  15 mg Oral TID  . insulin aspart  0-9 Units Subcutaneous TID WC  . insulin aspart  3 Units Subcutaneous TID WC  . insulin detemir  16 Units Subcutaneous BID  . memantine  10 mg Oral BID  . pregabalin  50 mg Oral Daily  . sodium bicarbonate  650 mg Oral TID  . ticagrelor  90 mg Oral BID   Continuous Infusions: . heparin 800 Units/hr (01/10/17 1412)   PRN Meds:.acetaminophen, nitroGLYCERIN, ondansetron (ZOFRAN) IV   Assessment/Plan:  1. NSTEMI 2. CAD native vessels with  remote PCI to all three coronary arteries and moderate to severe small vessel disease by angiogram 2013. 3. Acute on Chronic diastolic heart failure 4. DM 2 uncontrolled with Vascular and renal complications, CKD stage 4. 5. Asymptomatic sinus arrest with junctional escape, no significant pauses. 6. Anemia of chronic disease 7. Hypertension 8. Hyperliipidemia  Recommendation: Discontinue IV heparin. No significant rhythm  Issues. Continue IV diuresis for 3 more doses and switch to PO tomorrow. Appears ill and not sure she will be able to return home, need to evaluate home situation. DNR.   Adrian Prows, M.D. 01/11/2017, 8:59 AM [facility] Pager: [phone] Office: [phone] If no answer: [phone]

## 2017-01-11 NOTE — Progress Notes (Addendum)
   Subjective: Feels comfortable today, reports difficulty breathing which improves when the head of her bed is elevated. Over the last 24 hours she remained on nasal canula with SpO2 in the upper 90-100s. This morning her lungs are clear and she was transitioned to room air without worsening of her shortness of breath.   Objective:  Vital signs in last 24 hours: Vitals:   01/10/17 2200 01/11/17 0200 01/11/17 0412 01/11/17 1049  BP: (!) 134/56 (!) 151/60 (!) 142/38 (!) 120/56  Pulse: 71 76 82   Resp:   18   Temp: 98 F (36.7 C) 98 F (36.7 C) 98.4 F (36.9 C)   TempSrc: Axillary Oral Oral   SpO2: 100%  99%   Weight:   142 lb 1.6 oz (64.5 kg)   Height:       Physical Exam  Constitutional: She is well-developed, well-nourished, and in no distress. No distress.  HENT:  Head: Normocephalic and atraumatic.  Eyes: Right eye exhibits no discharge. Left eye exhibits no discharge. No scleral icterus.  Cardiovascular: Normal rate and regular rhythm.   No murmur heard. Mild JVD   Pulmonary/Chest: Breath sounds normal. She has no wheezes. She has no rales.  Bibasilar crackles   Abdominal: Soft. Bowel sounds are normal. She exhibits no distension. There is no tenderness. There is no guarding.  Neurological:  Lethargic but responsive  Skin: Skin is warm and dry. She is not diaphoretic.    Assessment/Plan:  Principal Problem:   NSTEMI (non-ST elevated myocardial infarction) ([place]) Active Problems:   Uncontrolled type 2 diabetes with renal manifestation (HCC)   Hyperlipidemia   ANEMIA   Essential hypertension   Coronary atherosclerosis   Dementia   CKD (chronic kidney disease) stage 4, GFR 15-29 ml/min (HCC)   CHF (congestive heart failure) (HCC)  NSTEMI, Type 2   Hyperlipidemia  She continues to deny chest pain, IV heparin will be discontinued today.  Continue aspirin and ticagrelor  Continue atorvastatin 20 mg daily ( I do not think that increasing to a high intensity statin  given her age is appropriate at this time)  Cardiology is following we appreciate their recommendations Follow up echocardiogram, this is scheduled for today  Uncontrolled type 2 diabetes with nephropathy  Last hemoglobin A1c 01/2016 8.1. Home medications include sliding scale NovoLog 0-15 units 3 times a day, Levemir 25 units twice a day. -NovoLog 3 units 3 times daily with sensitive sliding scale -Levemir 16 units twice daily -CBG monitoring  CHF Acute on chronic kidney disease stage 4 Pulmonary edema on x-ray yesterday shows improvement, she has had 1.2 liters of urine output in the past 24 hours with IV lasix and weight has improved 3 pounds since admission. Crt remains elevated from baseline 2.6 and has not shown much improvement with diuresis.  Continue daily weights and strict I&Os Furosemide 60 mg 3 times daily Continue Carvedilol 3.125 mg twice daily  Hypertension  Normotensive today  - continue home med amlodipine and hydralazine   Dementia Stable   continue home medication memantine 10 mg twice daily Delirium precautions   Dispo: Anticipated discharge in approximately 1-2 day(s).   Ledell Noss, MD 01/11/2017, 11:50 AM Pager: [phone]

## 2017-01-11 NOTE — Progress Notes (Signed)
Subjective: Pt. Was resting comfortably on RA this AM.  Pt. Denies SOB, chest pain, general discomfort. Daughter updated this AM.  Objective: Vital signs in last 24 hours: Vitals:   01/10/17 2026 01/10/17 2200 01/11/17 0200 01/11/17 0412  BP: (!) 136/43 (!) 134/56 (!) 151/60 (!) 142/38  Pulse: 68 71 76 82  Resp: 18   18  Temp: 98.6 F (37 C) 98 F (36.7 C) 98 F (36.7 C) 98.4 F (36.9 C)  TempSrc: Oral Axillary Oral Oral  SpO2: 100% 100%  99%  Weight:    64.5 kg (142 lb 1.6 oz)  Height:       Weight change: -1.315 kg (-2 lb 14.4 oz)  Intake/Output Summary (Last 24 hours) at 01/11/17 0821 Last data filed at 01/11/17 0643  Gross per 24 hour  Intake           424.27 ml  Output             1200 ml  Net          -775.73 ml   Physical exam: Gen: Well appearing, NAD CV: mildly tachycardic, regular rhythm, systolic murmur Pulm: CTAB Extremities: Trace edema Abdomen: soft, nontender  Lab Results: Basic Metabolic Panel:  Recent Labs Lab 01/10/17 0755 01/11/17 0322  NA 143 143  K 4.0 3.8  CL 107 105  CO2 25 24  GLUCOSE 87 177*  BUN 63* 60*  CREATININE 3.58* 3.68*  CALCIUM 8.9 8.5*   CBC:  Recent Labs Lab 01/10/2017 1046 01/10/17 2056  WBC 10.0 11.8*  HGB 9.8* 9.2*  HCT 30.5* 28.3*  MCV 91.3 91.6  PLT 262 243   Cardiac Enzymes:  Recent Labs Lab 01/03/2017 1826 01/19/2017 2301 01/10/17 0755  TROPONINI 7.46* 8.40* 6.86*   CBG:  Recent Labs Lab 01/10/17 1115 01/10/17 1627 01/10/17 2108 01/10/17 2142 01/10/17 2254 01/11/17 0542  GLUCAP 154* 111* 51* 85 123* 187*   Hemoglobin A1C:  Recent Labs Lab 01/10/17 1420  HGBA1C 10.0*    Micro Results: Recent Results (from the past 240 hour(s))  MRSA PCR Screening     Status: Abnormal   Collection Time: 01/10/17 12:04 PM  Result Value Ref Range Status   MRSA by PCR POSITIVE (A) NEGATIVE Final    Comment:        The GeneXpert MRSA Assay (FDA approved for NASAL specimens only), is one component of  a comprehensive MRSA colonization surveillance program. It is not intended to diagnose MRSA infection nor to guide or monitor treatment for MRSA infections. RESULT CALLED TO, READ BACK BY AND VERIFIED WITH: Cleda Daub RN 14:20 01/10/17 ([email or web address])     Medications:  Scheduled Meds: . amLODipine  10 mg Oral Daily  . aspirin EC  81 mg Oral Daily  . atorvastatin  20 mg Oral Daily  . carvedilol  6.25 mg Oral BID WC  . famotidine  20 mg Oral Daily  . furosemide  60 mg Intravenous TID  . hydrALAZINE  15 mg Oral TID  . insulin aspart  0-9 Units Subcutaneous TID WC  . insulin aspart  3 Units Subcutaneous TID WC  . insulin detemir  16 Units Subcutaneous BID  . memantine  10 mg Oral BID  . pregabalin  50 mg Oral Daily  . sodium bicarbonate  650 mg Oral TID  . ticagrelor  90 mg Oral BID   Continuous Infusions: . heparin 800 Units/hr (01/10/17 1412)   PRN Meds:.acetaminophen, nitroGLYCERIN, ondansetron (ZOFRAN) IV Assessment/Plan: Principal Problem:   NSTEMI (  non-ST elevated myocardial infarction) ([place]) Active Problems:   Uncontrolled type 2 diabetes with renal manifestation (HCC)   Hyperlipidemia   ANEMIA   Essential hypertension   Coronary atherosclerosis   Dementia   CKD (chronic kidney disease) stage 4, GFR 15-29 ml/min (HCC)   CHF (congestive heart failure) ([place])   [name] is an 83 y/o woman w/ PMH of CHF, HTN, type 2 diabetes mellitus, CKD Stage 4 being managed for NSTEMI in setting of acute decompensated heart failure.  NSTEMI: Pt. Denies chest pain, SOB this AM. ECG yesterday afternoon stable. Continues to be hemodynamically stable.   -D/c UF heparin per cardiology rec, we appreciate their continued help with this case -aspirin 81 mg -ticagrelor 90 mg--->per cards -atorvastatin 20 mg -nitroglycerin 0.4 SL PRN  Acute on chronic CHF exacerbation: Repeat BNP 4,345 this AM (3,582 two days ago). Symptomatically improved, clear lungs, trace baseline edema in extremities.  Breathing relatively comfortably on RA, saturating 100%.   -carvedilol 3.125 mg BID (home dose was double) -Strict I&Os---->foley in place -Trend BNP per cardiology rec.  Elevated Cr in setting of CKD: Urine output remains appropriate. Cr has increased 0.1 daily since admission, 3.68 today. However, this is notably a relatively small change in GFR given the inverse relationship of Cr and GFR. Most likely due to cardiorenal syndrome in setting of intense diuresis and underlying CKD. Electrolytes WNL and stable. Mild leukocytosis discovered incidentally possibly concerning for UTI, but pt. Asymptomatic, afebrile. No indication for workup at this time.  -Monitor electrolytes  HTN: Essential HTN. Baseline 160-170/80-90. Currently 126/48, stable.  -home amlodipine -home hydralazine  Dementia: Pt. Continues to answer most questions appropriately, and seems to be able to express discomfort when experienced.   -home memantine  Diarrhea: Recent bouts of diarrhea (past few days before presentation).  -home loperamide PRN  GERD: No acute complaints. Stable  -home ranitidine  Neurodermatitis: Stable.  -Triamcinolone   Dispo:Floor FEN/GI:ranitidine Diet: Heart healthy, low-sodium DVT ppx: SCDs   This is a Careers information officer Note.  The care of the patient was discussed with Dr. Hetty Ely and the assessment and plan formulated with their assistance.  Please see their attached note for official documentation of the daily encounter.   LOS: 2 days   Jillyn Hidden, Medical Student 01/11/2017, 8:21 AM

## 2017-01-11 NOTE — Progress Notes (Signed)
Internal Medicine Attending:   I saw and examined the patient. I reviewed the resident's note and I agree with the resident's findings and plan as documented in the resident's note. Overall patient resting comfortably when we entered room, she was easily arrousable to voice however then became mildly aggitated with slightly increased respiratory rate.  On my exam she has mild JVD, lungs however were grossly CTA today, she had no supplemental o2 requirement. Trace bilateral edema, no abdominal pain. A/P NSTEMI - Completed 48 hours of heparin, D/C'd today - Has been treated with ASA+brilinta per cards, Dr Hetty Ely discussed with PCP and we will be changing back to AQSA+ plavix by time of discharge - Continue atorvastatin 20mg   Acute on chronic Diastolic CHF - Cards repeated BNP today>> has increased, I am not sure this really indicates increased right atrial strain given that she also has some AKI on CKD.  On exam she appears much less volume overloaded.  She has already obtained an Echo today, will follow up report to see IVC assessment and estimated PA pressure to help assess if further diuresis is necessary.  Will consult Social work for SNF/rehab placement

## 2017-01-11 NOTE — Progress Notes (Signed)
New IV placed in RFA heparin restarted with no bleeding this far. Blood sugar 51 at 2108 dinner tray was still in the room not touched. Pt was fed BS 85@ 2142 then 123@ 2254

## 2017-01-11 NOTE — Progress Notes (Signed)
Spoke with Gust Rung, MD about Pt. CBG dropping 51 and not covering pt. Due to her no eating 20% of meal, or staying alert. We have been offering juice to Pt. every visit to maintain BS. Orders were modified for patient. Pt. Has Hx of HR dropping into 30's. Pt.BP soft at due time for Hydralazine. Was instructed not to give.

## 2017-01-12 DIAGNOSIS — N179 Acute kidney failure, unspecified: Secondary | ICD-10-CM

## 2017-01-12 DIAGNOSIS — Z515 Encounter for palliative care: Secondary | ICD-10-CM

## 2017-01-12 DIAGNOSIS — Z7189 Other specified counseling: Secondary | ICD-10-CM

## 2017-01-12 LAB — CREATININE, SERUM
CREATININE: 4.86 mg/dL — AB (ref 0.44–1.00)
GFR, EST AFRICAN AMERICAN: 9 mL/min — AB (ref >60)
GFR, EST NON AFRICAN AMERICAN: 7 mL/min — AB (ref >60)

## 2017-01-12 LAB — BASIC METABOLIC PANEL
ANION GAP: 18 — AB (ref 5–15)
BUN: 79 mg/dL — ABNORMAL HIGH (ref 6–20)
CALCIUM: 8.7 mg/dL — AB (ref 8.9–10.3)
CHLORIDE: 102 mmol/L (ref 101–111)
CO2: 22 mmol/L (ref 22–32)
CREATININE: 4.87 mg/dL — AB (ref 0.44–1.00)
GFR calc non Af Amer: 7 mL/min — ABNORMAL LOW (ref >60)
GFR, EST AFRICAN AMERICAN: 9 mL/min — AB (ref >60)
Glucose, Bld: 273 mg/dL — ABNORMAL HIGH (ref 65–99)
Potassium: 4.4 mmol/L (ref 3.5–5.1)
SODIUM: 142 mmol/L (ref 135–145)

## 2017-01-12 LAB — CBC
HCT: 26.3 % — ABNORMAL LOW (ref 36.0–46.0)
Hemoglobin: 8.7 g/dL — ABNORMAL LOW (ref 12.0–15.0)
MCH: 30.5 pg (ref 26.0–34.0)
MCHC: 33.1 g/dL (ref 30.0–36.0)
MCV: 92.3 fL (ref 78.0–100.0)
PLATELETS: 248 10*3/uL (ref 150–400)
RBC: 2.85 MIL/uL — AB (ref 3.87–5.11)
RDW: 15.2 % (ref 11.5–15.5)
WBC: 9.7 10*3/uL (ref 4.0–10.5)

## 2017-01-12 LAB — BRAIN NATRIURETIC PEPTIDE: B Natriuretic Peptide: >4500 pg/mL — ABNORMAL HIGH (ref 0.0–100.0)

## 2017-01-12 LAB — GLUCOSE, CAPILLARY: GLUCOSE-CAPILLARY: 249 mg/dL — AB (ref 65–99)

## 2017-01-12 MED ORDER — LORAZEPAM 2 MG/ML IJ SOLN: 0.5000 mg | INTRAMUSCULAR | Status: DC | PRN

## 2017-01-12 MED ORDER — DOBUTAMINE IN D5W 4-5 MG/ML-% IV SOLN
2.5000 ug/kg/min | INTRAVENOUS | Status: DC
  Administered 2017-01-12: 2.5 ug/kg/min via INTRAVENOUS
  Filled 2017-01-12 (×2): qty 250

## 2017-01-12 MED ORDER — GLYCOPYRROLATE 0.2 MG/ML IJ SOLN: 0.2000 mg | INTRAMUSCULAR | Status: DC | PRN

## 2017-01-12 MED ORDER — SODIUM CHLORIDE 0.9% FLUSH
3.0000 mL | Freq: Two times a day (BID) | INTRAVENOUS | Status: DC
  Administered 2017-01-12: 3 mL via INTRAVENOUS

## 2017-01-12 MED ORDER — GLYCOPYRROLATE 0.2 MG/ML IJ SOLN
0.2000 mg | INTRAMUSCULAR | Status: DC | PRN
  Administered 2017-01-12: 0.2 mg via INTRAVENOUS
  Filled 2017-01-12: qty 1

## 2017-01-12 MED ORDER — MORPHINE SULFATE (PF) 2 MG/ML IV SOLN
2.0000 mg | INTRAVENOUS | Status: DC | PRN
  Administered 2017-01-12 – 2017-01-13 (×7): 2 mg via INTRAVENOUS
  Filled 2017-01-12 (×2): qty 2
  Filled 2017-01-12 (×3): qty 1
  Filled 2017-01-12: qty 2
  Filled 2017-01-12: qty 1

## 2017-01-12 MED ORDER — ISOSORB DINITRATE-HYDRALAZINE 20-37.5 MG PO TABS
1.0000 | ORAL_TABLET | Freq: Three times a day (TID) | ORAL | Status: DC
  Administered 2017-01-12: 1 via ORAL
  Filled 2017-01-12: qty 1

## 2017-01-12 MED ORDER — HEPARIN SODIUM (PORCINE) 5000 UNIT/ML IJ SOLN: 5000.0000 [IU] | Freq: Three times a day (TID) | INTRAMUSCULAR | Status: DC

## 2017-01-12 MED ORDER — ACETAMINOPHEN 650 MG RE SUPP
650.0000 mg | Freq: Four times a day (QID) | RECTAL | Status: DC | PRN
  Administered 2017-01-14: 650 mg via RECTAL
  Filled 2017-01-12: qty 1

## 2017-01-12 MED ORDER — GLYCOPYRROLATE 1 MG PO TABS: 1.0000 mg | ORAL_TABLET | ORAL | Status: DC | PRN

## 2017-01-12 MED ORDER — ACETAMINOPHEN 325 MG PO TABS
650.0000 mg | ORAL_TABLET | Freq: Four times a day (QID) | ORAL | Status: DC | PRN
  Administered 2017-01-12: 650 mg via ORAL
  Filled 2017-01-12: qty 2

## 2017-01-12 MED ORDER — CLOPIDOGREL BISULFATE 75 MG PO TABS: 75.0000 mg | ORAL_TABLET | Freq: Every day | ORAL | Status: DC

## 2017-01-12 MED ORDER — BIOTENE DRY MOUTH MT LIQD: 15.0000 mL | OROMUCOSAL | Status: DC | PRN

## 2017-01-12 MED ORDER — SODIUM CHLORIDE 0.9 % IV SOLN: 250.0000 mL | INTRAVENOUS | Status: DC | PRN

## 2017-01-12 MED ORDER — SODIUM CHLORIDE 0.9% FLUSH: 3.0000 mL | INTRAVENOUS | Status: DC | PRN

## 2017-01-12 MED ORDER — POLYVINYL ALCOHOL 1.4 % OP SOLN
1.0000 [drp] | Freq: Four times a day (QID) | OPHTHALMIC | Status: DC | PRN
  Filled 2017-01-12: qty 15

## 2017-01-12 MED ORDER — INSULIN ASPART 100 UNIT/ML ~~LOC~~ SOLN: 0.0000 [IU] | Freq: Three times a day (TID) | SUBCUTANEOUS | Status: DC

## 2017-01-12 MED ORDER — MUPIROCIN 2 % EX OINT: 1.0000 "application " | TOPICAL_OINTMENT | Freq: Two times a day (BID) | CUTANEOUS | Status: DC

## 2017-01-12 NOTE — Progress Notes (Signed)
I met with Ms. Kirsh family at bedside this evening, her son and daughter were there. They asked many questions and expressed their priority for her remaining comfortable. They understand her decline in heart function and that she may pass away this evening. They ask that we continue the dobutamine drip until tomorrow afternoon.  [name] says she is having a headache at this time but otherwise feels comfortable. Her shortness of breath is being managed with morphine and ativan. We will give toradol for her headache.

## 2017-01-12 NOTE — Progress Notes (Signed)
Chaplain visited with patient who has declined to palliative care and comfort measures.  Lots of family in the room.  Chaplain introduced herself to family and to patient.  Chaplain provided ministry of presence for the patient and prayer for the patient and family.    Chaplain will remain available for this family as needed.    01/12/17 1714  Clinical Encounter Type  Visited With Patient and family together  Visit Type Initial;Psychological support;Spiritual support;Social support  Referral From Nurse  Consult/Referral To Chaplain  Spiritual Encounters  Spiritual Needs Prayer;Emotional;Grief support  Stress Factors  Patient Stress Factors Health changes  Family Stress Factors Loss

## 2017-01-12 NOTE — Consult Note (Signed)
Consultation Note Date: 01/12/2017   Patient Name: [name]  DOB: 1928/09/15  MRN: [id number]  Age / Sex: 83 y.o., female  PCP: Patient, No Pcp Per Referring Physician: Lucious Groves, DO  Reason for Consultation: Establishing goals of care and Terminal Care  HPI/Patient Profile: 83 y.o. female  with past medical history of diastolic CHF (EF 17-35%), HTN, CAD (STEMI x2), type 2 diabetes mellitus, CKD stage 4, and breast cancer (s/p lumpectomy 2000), dementia admitted on 01/08/2017 with SOB and ECHO resulted in akinesis of the mid-apicalanteroseptal and apical myocardium with EF 10%. On 01/12/17 she developed symptomatic bradycardia into 20s requiring dobutamine infusion. Also with worsening renal function. Palliative care consulted in setting of very poor prognosis.   Clinical Assessment and Goals of Care: I met today first with daughter, Ubaldo Glassing, and PCP Dr. Jimmye Norman, and [facility] RN Izora Gala. Dr. Jimmye Norman was very helpful and comforting to family during this time. We discussed her acute decline and multiorgan failure and the lack of options to reverse this conditions at this stage of her disease process. Dr. Jimmye Norman reiterated Ms. Couser's strength and consistent desires for no dialysis. Ubaldo Glassing confirms that Ms. Holm has been telling her recently that she would not live long. Ubaldo Glassing also believes that her mother is prepared to die. Ubaldo Glassing agrees with recommendation to keep her mother comfortable recognizing that prognosis is likely hours to days.   I met again later with multiple other family members including Ms. Popko's 2 sons. All have good understanding of Ms. Levi's poor prognosis and are appropriately tearful. All agree they do not wish for her to suffer and want her comfortable. We discussed use of morphine in comfort. We also discussed role of dobutamine in possibly extending her time (to allow family time to  come and visit) but also the likelihood to d/c dobutamine as this will also prolong her suffering. They wish to continue dobutamine for now to allow family visitation.   Emotional support provided. Chaplain consulted for additional spiritual support and prayer as desired by family.   Primary Decision Maker NEXT OF KIN 3 out of 4 of Ms. Campusano's family available for conversation today    SUMMARY OF RECOMMENDATIONS   - Comfort care - Anticipate hospital death - Continue dobutamine to give family time to gather  Code Status/Advance Care Planning:  DNR   Symptom Management:   Pain/dyspnea: Morphine 2-4 mg IV every 30 min prn. May need infusion. Discussed with RN and 2 mg providing relief as well as allowing her to be alert and engage with family.   Ativan prn for anxiety.   Robinul prn secretions.   Palliative Prophylaxis:   Aspiration, Delirium Protocol, Frequent Pain Assessment, Oral Care and Turn Reposition  Additional Recommendations (Limitations, Scope, Preferences):  Full Comfort Care  Psycho-social/Spiritual:   Desire for further Chaplaincy support:yes  Additional Recommendations: Caregiving  Support/Resources and Grief/Bereavement Support  Prognosis:   Hours - Days  Discharge Planning: Anticipated Hospital Death      Primary Diagnoses: Present on Admission: .  Hyperlipidemia . Uncontrolled type 2 diabetes with renal manifestation ([place]) . ANEMIA . Essential hypertension . Coronary atherosclerosis . CKD (chronic kidney disease) stage 4, GFR 15-29 ml/min (HCC) . NSTEMI (non-ST elevated myocardial infarction) ([place]) . Dementia   I have reviewed the medical record, interviewed the patient and family, and examined the patient. The following aspects are pertinent.  Past Medical History:  Diagnosis Date  . ANEMIA   . Anemia of chronic disease   . Angina   . Breast cancer ([place]) 2000   right  . CAD 2000, 2011   MI 2000, NQWMI 09/2009  . CHF (congestive  heart failure) ([place])   . Chronic kidney disease (CKD), stage IV (severe) ([place])   . Dementia   . DEPRESSION   . DIABETES MELLITUS, TYPE II    renal, neuro - peripheral neuropathy  . Hypercholesteremia   . HYPERTENSION   . Inferior MI ([place]) 2000   "quiet"  . INSOMNIA, CHRONIC   . Loose stools   . Neurodermatitis   . Non-Q wave myocardial infarction ([place]) 10/2009   /E-chart  . PVD   . Shortness of breath dyspnea    Social History   Social History  . Marital status: Widowed    Spouse name: N/A  . Number of children: N/A  . Years of education: N/A   Social History Main Topics  . Smoking status: Former Smoker    Packs/day: 1.00    Years: 20.00    Types: Cigarettes  . Smokeless tobacco: Never Used     Comment: 11/21/11 "quit smoking 30-40 years ago  . Alcohol use No     Comment: 11/21/11 "used to drink quit alot; last drink of alcohol maybe 30 years ago"  . Drug use: No  . Sexual activity: Not Currently   Other Topics Concern  . None   Social History Narrative   Lives alone with her dog. Very active in her church chair. Volunteers at [employer]   Family History  Problem Relation Age of Onset  . Coronary artery disease Father    Scheduled Meds: . pregabalin  50 mg Oral Daily   Continuous Infusions: . DOBUTamine 7.5 mcg/kg/min (01/12/17 1130)   PRN Meds:.acetaminophen **OR** acetaminophen, antiseptic oral rinse, [DISCONTINUED] glycopyrrolate **OR** [DISCONTINUED] glycopyrrolate **OR** glycopyrrolate, LORazepam, morphine injection, nitroGLYCERIN, ondansetron (ZOFRAN) IV, polyvinyl alcohol Allergies  Allergen Reactions  . Ace Inhibitors Cough   Review of Systems  Constitutional:       GENERALIZED PAIN  Respiratory: Positive for shortness of breath.     Physical Exam  Constitutional: She appears well-developed.  HENT:  Head: Normocephalic and atraumatic.  Cardiovascular: Bradycardia present.   Pulmonary/Chest: Accessory muscle usage present. No  tachypnea. She is in respiratory distress.  Abdominal: Normal appearance.  Neurological: She is alert.  Exhausted and sleepy. Seems slightly confused.   Nursing note and vitals reviewed.   Vital Signs: BP (!) 116/58 (BP Location: Left Arm)   Pulse 65   Temp 97.5 F (36.4 C) (Oral)   Resp 16   Ht 5' 3"  (1.6 m)   Wt 66.2 kg (146 lb)   SpO2 93%   BMI 25.86 kg/m  Pain Assessment: Faces POSS *See Group Information*: 1-Acceptable,Awake and alert Pain Score: 0-No pain   SpO2: SpO2: 93 % O2 Device:SpO2: 93 % O2 Flow Rate: .O2 Flow Rate (L/min): 2 L/min  IO: Intake/output summary:  Intake/Output Summary (Last 24 hours) at 01/12/17 1310 Last data filed at 01/11/17 1800  Gross per 24 hour  Intake              240 ml  Output                0 ml  Net              240 ml    LBM: Last BM Date: 01/11/17 Baseline Weight: Weight: 65.8 kg (145 lb) Most recent weight: Weight: 66.2 kg (146 lb)     Palliative Assessment/Data: PPS: 20%    Time Total: 57mn  Greater than 50%  of this time was spent counseling and coordinating care related to the above assessment and plan.  Signed by: AVinie Sill NP Palliative Medicine Team Pager # [phone](M-F 8a-5p) Team Phone # [phone](Nights/Weekends)

## 2017-01-12 NOTE — Progress Notes (Addendum)
Inpatient Diabetes Program Recommendations  AACE/ADA: New Consensus Statement on Inpatient Glycemic Control (2015)  Target Ranges:  Prepandial:   less than 140 mg/dL      Peak postprandial:   less than 180 mg/dL (1-2 hours)      Critically ill patients:  140 - 180 mg/dL   Lab Results  Component Value Date   GLUCAP 249 (H) 01/12/2017   HGBA1C 10.0 (H) 01/10/2017    Review of Glycemic Control Results for [name], [name] (MRN [id number]) as of 01/12/2017 07:53  Ref. Range 01/11/2017 05:42 01/11/2017 11:51 01/11/2017 16:43 01/11/2017 20:59 01/12/2017 06:19  Glucose-Capillary Latest Ref Range: 65 - 99 mg/dL 187 (H) 295 (H) 252 (H) 210 (H) 249 (H)   Diabetes history:DM2 Outpatient Diabetes medications: Levemir 20 units bid + Novolog 0-15 units MC tid (reports not taking) Current orders for Inpatient glycemic control: Levemir 10 bid + Novolog 3 units tid + Novolog correction 0-9 units tid  Inpatient Diabetes Program Recommendations:  Patient only received 10 units Levemir total yesterday.  Please add Novolog correction 0-9 units tid with meals Add Novolog 3 units meal coverage tid today if eating improved  Thank you, Bethena Roys E. Chantia Amalfitano, RN, MSN, CDE  Diabetes Coordinator Inpatient Glycemic Control Team Team Pager [phone] (8am-5pm) 01/12/2017 8:02 AM

## 2017-01-12 NOTE — Care Management Note (Addendum)
Case Management Note Marvetta Gibbons RN, BSN Unit 2W-Case Manager [phone]  Patient Details  Name: [name] MRN: [id number] Date of Birth: Jan 09, 1929  Subjective/Objective:  Pt admitted with NSTEMI                  Action/Plan: PTA pt lived at home with family- active with [facility]- plan for pt to d/c to SNF- [facility] following for placement needs  Expected Discharge Date:                 Expected Discharge Plan:  Skilled Nursing Facility  In-House Referral:  Clinical Social Work  Discharge planning Services  CM Consult  Post Acute Care Choice:    Choice offered to:     DME Arranged:    DME Agency:     HH Arranged:    [place] Agency:     Status of Service:  In process, will continue to follow  If discussed at Long Length of Stay Meetings, dates discussed:    Discharge Disposition: skilled facility  Additional Comments:  01/12/17- 1120- Marvetta Gibbons Therapist, sports. CM- pt in distress this AM- plan to tx to SDU and start dopamine, and possible milrinone per cards- PC team consulted.    Dawayne Patricia, RN 01/12/2017, 11:21 AM

## 2017-01-12 NOTE — Progress Notes (Signed)
Palliative:  Full note to follow. I have spoken with daughter, Ubaldo Glassing, and PACE Dr. Jimmye Norman and RN. Ubaldo Glassing is tearful but fully aware that her mother is at EOL. Alexis agrees with comfort care and we will continue dobutamine infusion for now to allow family time to gather at bedside. Anticipate hospital death.   Vinie Sill, NP Palliative Medicine Team Pager # [phone] (M-F 8a-5p) Team Phone # [phone] (Nights/Weekends)

## 2017-01-12 NOTE — Progress Notes (Signed)
   Subjective: Feels that her breathing is uncomfortable today and improves only minimally with elevation of the head of her bed, she denies chest pain. Echo yesterday revealed akinesis of the mid-apicalanteroseptal and apical myocardium, EF 10%, PA pressure 46 mmHg and dilated IVC.    Objective:  Vital signs in last 24 hours: Vitals:   01/11/17 1852 01/11/17 2044 01/12/17 0456 01/12/17 1020  BP: (!) 79/58 122/65 (!) 137/57 (!) 116/58  Pulse:  73 (!) 113 65  Resp:  20 (!) 21 16  Temp:  97.7 F (36.5 C) 97.4 F (36.3 C) 97.5 F (36.4 C)  TempSrc:  Oral Axillary Oral  SpO2:  98% 93%   Weight:   143 lb (64.9 kg) 146 lb (66.2 kg)  Height:       Physical Exam  Constitutional: She is well-developed, well-nourished, and in no distress. No distress.  HENT:  Head: Normocephalic and atraumatic.  Eyes: Right eye exhibits no discharge. Left eye exhibits no discharge. No scleral icterus.  Cardiovascular: Normal rate and regular rhythm.   No murmur heard. Mild JVD   Pulmonary/Chest: Breath sounds normal. She has no wheezes. She has no rales.  Bibasilar crackles   Neurological:  Lethargic but responsive  Skin: Skin is warm and dry. She is not diaphoretic.   Assessment/Plan:  Principal Problem:   NSTEMI (non-ST elevated myocardial infarction) ([place]) Active Problems:   Uncontrolled type 2 diabetes with renal manifestation (HCC)   Hyperlipidemia   ANEMIA   Essential hypertension   Coronary atherosclerosis   Dementia   CKD (chronic kidney disease) stage 4, GFR 15-29 ml/min (HCC)   CHF (congestive heart failure) (HCC)  NSTEMI Hyperlipidemia  CHF Acute on chronic kidney disease stage 4 Echo yesterday was very concerning, she has suffered an extensive MI and cardiac function is severely reduced. Crt continues to rise and urine output was limited overnight despite IV Lasix. Cardiology discontinued her lasix yesterday and this morning she was transferred to step down and started on  dobutamine gtt. I spoken with her primary care physician Dr. Jimmye Norman she will stop by to see Mrs. Meader today but feels at this time that she would better benefit from comfort care. Discontinued Ticagrelor in hopes that this will decrease her sensation of shortness of breath and restarted ticagrelor.  Continue aspirin Continue atorvastatin 20 mg daily  Cardiology is following we appreciate their recommendations Continue daily weights and strict I&Os Continue Carvedilol 3.125 mg twice daily  Uncontrolled type 2 diabetes with nephropathy  Last hemoglobin A1c 01/2016 8.1. Home medications include sliding scale NovoLog 0-15 units 3 times a day, Levemir 25 units twice a day. - sensitive sliding scale -Levemir 10 units twice daily -CBG monitoring  Hypertension  Normotensive today  - continue home med hydralazine and amlodipine, these medications should not be interfering with her heart failure  Dementia Stable  continue home medication memantine 10 mg twice daily Delirium precautions   Dispo: Anticipated discharge in approximately 1-2 day(s).   Ledell Noss, MD 01/12/2017, 11:12 AM Pager: [phone]

## 2017-01-12 NOTE — Progress Notes (Addendum)
Agree with the evaluation and the assessment of all team members. There is not much to hope for recovery. I agree with discontinuing  Statins and Brilinta. And also discontinue non comfort medications. Continue Dobutamine for comfort. Probably stop in morning   Adrian Prows, MD 01/12/2017, [facility] [place] Pager: [phone] Office: [phone] If no answer: Cell:  [phone]

## 2017-01-12 NOTE — Progress Notes (Signed)
Received call from central telemetry advising patient's heart rate has dipped down into the upper 20's several times during the night. Patient has recently experienced at 2.1 second pause, strip saved.

## 2017-01-12 NOTE — Progress Notes (Signed)
Clinical Social Worker was following patient for placement at SNF with the assistant of [facility]. Patient was transferred to 3w35 and currently has poor prognosis. [facility] is aware of patents tranfers and is following patient on 3w. Palliative has been consulted and CSW made charge RN on 3w aware that [facility] would like to be apart of palliative consult to assist family with support. CSW made 3w CSW Darcella Cheshire aware of patients transport to her floor.   Rhea Pink, MSW,  [place]

## 2017-01-12 NOTE — Progress Notes (Signed)
Internal Medicine Attending:   I saw and examined the patient. I reviewed the resident's note and I agree with the resident's findings and plan as documented in the resident's note. This morning Dr Einar Gip evaluated patient, appears to be going into a cardiorenal physiology and clinically worsening, she was transferred to [facility] with dobutamine infusion started.  Palliative care was consulted.  On our rounds of the patient she appears to be in mild respiratory distress despite supplemental O2 and O2 sat of 100%, she denied any complaints.  She has JVD elevated to her jaw although her posterior lung fields we grossly clear.  On telemetry she appears to have junctional rhythm and is significantly bradycardic.  I discussed the echo results with her daughter Ubaldo Glassing showing severely reduced EF, as well as elevated pulmonary pressures and given her worsening renal function I suspect her NSTEMI has caused a sentinal event for her mother and she will likely continue to decline.  I discussed this also with her PCP Dr Jimmye Norman who was present this afternoon with palliative care for further discussion.  The results of this discussion were that comfort measure should be pursued.  We will plan to discontinue the aggressive care and start palliative measure, she will likely have an in hospital death.

## 2017-01-12 NOTE — Progress Notes (Addendum)
Subjective:  Very dyspneic and in distress. Ate her breakfast and took her meds this morning  Objective:  Vital Signs in the last 24 hours: Temp:  [97.4 F (36.3 C)-97.8 F (36.6 C)] 97.4 F (36.3 C) (06/21 0456) Pulse Rate:  [42-113] 113 (06/21 0456) Resp:  [20-21] 21 (06/21 0456) BP: (79-137)/(48-65) 137/57 (06/21 0456) SpO2:  [93 %-98 %] 93 % (06/21 0456) Weight:  [64.9 kg (143 lb)] 64.9 kg (143 lb) (06/21 0456)  Intake/Output from previous day: 06/20 0701 - 06/21 0700 In: 480 [P.O.:480] Out: 200 [Urine:200]  Physical Exam:   General appearance: alert, cooperative, fatigued, mild distress and slowed mentation Eyes: negative findings: lids and lashes normal Neck: JVD up the angle of jaw Resp: Diffuse crackles Chest wall: no tenderness Cardio: regular rate and rhythm, S1, S2 normal, no murmur, click, rub or gallop Distant heart sounds GI: soft, non-tender; bowel sounds normal; no masses,  no organomegaly Extremities: trace edema. Full range of movements Vascular exam: Carotid normal, femoral normal, popliteal faint bilateral, absent pedal pulses.   Lab Results: BMP  Recent Labs  01/10/17 0755 01/11/17 0322 01/12/17 0344  NA 143 143 142  K 4.0 3.8 4.4  CL 107 105 102  CO2 25 24 22   GLUCOSE 87 177* 273*  BUN 63* 60* 79*  CREATININE 3.58* 3.68* 4.87*  CALCIUM 8.9 8.5* 8.7*  GFRNONAA 11* 10* 7*  GFRAA 12* 12* 9*    CBC  Recent Labs Lab 01/10/17 2056  WBC 11.8*  RBC 3.09*  HGB 9.2*  HCT 28.3*  PLT 243  MCV 91.6  MCH 29.8  MCHC 32.5  RDW 14.7    HEMOGLOBIN A1C Lab Results  Component Value Date   HGBA1C 10.0 (H) 01/10/2017   MPG 240 01/10/2017    Cardiac Panel (last 3 results)  Recent Labs  01/07/2017 1826  2301 01/10/17 0755  TROPONINI 7.46* 8.40* 6.86*   BNP (last 3 results)  Recent Labs  12/28/2016 1826 01/11/17 0322 01/12/17 0344  BNP 3,582.2* 4,345.5* >4,500.0*    TSH No results for input(s): TSH in the last 8760  hours.  Lipid Panel     Component Value Date/Time   CHOL 132 11/07/2013 1125   TRIG 114.0 11/07/2013 1125   TRIG 130 10/12/2009   HDL 31.10 (L) 11/07/2013 1125   CHOLHDL 4 11/07/2013 1125   VLDL 22.8 11/07/2013 1125   LDLCALC 78 11/07/2013 1125     Hepatic Function Panel  Recent Labs   1046  PROT 8.0  ALBUMIN 3.4*  AST 37  ALT 22  ALKPHOS 151*  BILITOT 0.6   Cardiac Studies:  EKG: 12/30/2016: Normal sinus rhythm, left axis deviation, LVH with repolarization of rheumatic. No change in T-wave inversion from prior EKG 03/10/2016.  Telemetry : Normal sinus rhythm, sinus bradycardia, lowest heart rate 30- 38 bpm, paroxysmal episodes of junctional escape rhythm. Pause 2.1 to 2.6 seconds  Echocardiogram 03/10/2016: Normal LV systolic function without wall motion of the normality, EF 55-60%. Grade 1 diastolic dysfunction. Moderate MR, left atrium moderately dilated. Mild TR and moderate pulmonary hypertension, PA pressure 57 mmHg.  Echocardiogram 01/11/2017: Left ventricle: The cavity size was mildly dilated. Systolic  function was severely reduced. The estimated ejection fraction   was in the range of 10% to 20%. Akinesis of the   mid-apicalanteroseptal and apical myocardium. - Aortic valve: Mildly to moderately calcified annulus. - Mitral valve: There was severe regurgitation. Valve area by   continuity equation (using LVOT flow): 1.09 cm^2.  -  Left atrium: The atrium was moderately dilated. - Right ventricle: The cavity size was mildly dilated. Systolic   function was moderately reduced. - Right atrium: The atrium was mildly dilated. - Tricuspid valve: There was severe regurgitation. - Pulmonary arteries: Systolic pressure was moderately increased.  PA peak pressure: 46 mm Hg (S).  Coronary angiogram 11/22/2011: Left main had 60% distal stenosis. LAD has 10-15% proximal and 60-65% mid stenosis. Distal stent in LAD is patent. Diagonal #1 is very small. Diagonal #2  has 70% ostial stenosis. Stented portion in the proximal and mid portion is patent. Ramus is very small which is diffusely diseased. Left circumflex has 30% proximal and 30-40% distal diffuse stenosis at prior PTCA site. OM-1 is very, very small. OM-2 has 75-80% mid stenosis at prior PTCA site. Vessel is very small. RCA has 20-30% mid and 30-40% distal stenosis. PDA is small which has 70% proximal stenosis and 30-40% distal diffuse stenosis. PLV branch is also very small which has 95% ostial stenosis at the bifurcation with PDA.  Scheduled Meds: . amLODipine  10 mg Oral Daily  . aspirin EC  81 mg Oral Daily  . atorvastatin  20 mg Oral Daily  . carvedilol  3.125 mg Oral BID WC  . famotidine  20 mg Oral Daily  . heparin  5,000 Units Subcutaneous Q8H  . insulin detemir  10 Units Subcutaneous BID  . isosorbide-hydrALAZINE  1 tablet Oral TID  . memantine  10 mg Oral BID  . pregabalin  50 mg Oral Daily  . sodium bicarbonate  650 mg Oral TID  . sodium chloride flush  3 mL Intravenous Q12H  . ticagrelor  90 mg Oral BID   Continuous Infusions: . sodium chloride    . DOBUTamine     PRN Meds:.sodium chloride, acetaminophen, nitroGLYCERIN, ondansetron (ZOFRAN) IV, sodium chloride flush   Assessment/Plan:  1. NSTEMI 2. CAD native vessels with remote PCI to all three coronary arteries and moderate to severe small vessel disease by angiogram 1610. 3. Acute systolic and diastolic heart failure 4. DM 2 uncontrolled with Vascular and renal complications, CKD stage 4. 5. Asymptomatic sinus arrest with junctional escape, no significant pauses (2.1 seconds). 6. Anemia of chronic disease 7. Hypertension 8. Hyperliipidemia  Recommendation: Patient has deteriorated and UOP is 200 mL last shift. Not responding to IV furosemide due to Cardiorenal syndrome. I do not think she will survive. However, mentally alert and is in significant distress. I will start her on IV Dobutamine and will consider  renal dose Primacor if she is able to tolerate. Given bradycardia and acute decompensation, will decrease Coreg 3.125 mg BID to 1/2 tab BID. Palliative team consulted.  Transfer to step-down  Adrian Prows, M.D. 01/12/2017, 9:23 AM [facility] Pager: [phone] Office: [phone] If no answer: [phone]

## 2017-01-13 DIAGNOSIS — Z515 Encounter for palliative care: Secondary | ICD-10-CM

## 2017-01-13 MED ORDER — MORPHINE SULFATE (PF) 4 MG/ML IV SOLN
4.0000 mg | INTRAVENOUS | Status: DC | PRN
  Administered 2017-01-13 – 2017-01-14 (×5): 4 mg via INTRAVENOUS
  Filled 2017-01-13 (×5): qty 1

## 2017-01-13 MED ORDER — GLYCOPYRROLATE 0.2 MG/ML IJ SOLN: 0.2000 mg | INTRAMUSCULAR | Status: DC | PRN

## 2017-01-13 MED ORDER — MORPHINE SULFATE (PF) 4 MG/ML IV SOLN: 4.0000 mg | INTRAVENOUS | Status: DC | PRN

## 2017-01-13 MED ORDER — MORPHINE SULFATE (PF) 4 MG/ML IV SOLN
4.0000 mg | INTRAVENOUS | Status: DC | PRN
  Administered 2017-01-13 (×2): 4 mg via SUBCUTANEOUS
  Filled 2017-01-13 (×2): qty 1

## 2017-01-13 MED ORDER — CLONAZEPAM 0.5 MG PO TBDP
1.0000 mg | ORAL_TABLET | Freq: Two times a day (BID) | ORAL | Status: DC | PRN
  Filled 2017-01-13: qty 2

## 2017-01-13 MED ORDER — FENTANYL CITRATE 100 MCG BU TABS: 200.0000 ug | ORAL_TABLET | BUCCAL | Status: DC | PRN

## 2017-01-13 MED ORDER — LORAZEPAM 2 MG/ML PO CONC
0.5000 mg | ORAL | Status: DC | PRN
  Administered 2017-01-13 – 2017-01-14 (×2): 0.5 mg via SUBLINGUAL
  Filled 2017-01-13 (×2): qty 1

## 2017-01-13 MED ORDER — GLYCOPYRROLATE 0.2 MG/ML IJ SOLN
0.2000 mg | INTRAMUSCULAR | Status: DC | PRN
  Administered 2017-01-14 (×2): 0.2 mg via INTRAVENOUS
  Filled 2017-01-13 (×2): qty 1

## 2017-01-13 MED ORDER — ONDANSETRON 4 MG PO TBDP
4.0000 mg | ORAL_TABLET | Freq: Three times a day (TID) | ORAL | Status: DC | PRN
  Filled 2017-01-13: qty 1

## 2017-01-13 MED ORDER — IPRATROPIUM-ALBUTEROL 0.5-2.5 (3) MG/3ML IN SOLN
3.0000 mL | RESPIRATORY_TRACT | Status: DC | PRN
  Administered 2017-01-13: 3 mL via RESPIRATORY_TRACT
  Filled 2017-01-13 (×2): qty 3

## 2017-01-13 NOTE — Progress Notes (Addendum)
   Subjective: [name] is not able to communicate when seen on rounds this morning. She opens her eyes to voice but has agonal breathing.  Objective:  Vital signs in last 24 hours: Vitals:   01/12/17 0456 01/12/17 1020 01/13/17 0740 01/13/17 0755  BP: (!) 137/57 (!) 116/58  (!) 113/55  Pulse: (!) 113 65  72  Resp: (!) 21 16 12 18   Temp: 97.4 F (36.3 C) 97.5 F (36.4 C)    TempSrc: Axillary Oral    SpO2: 93%   97%  Weight: 143 lb (64.9 kg) 146 lb (66.2 kg)    Height:       Physical Exam  Constitutional: She is well-developed, well-nourished, and in no distress. No distress.  HENT:  Head: Normocephalic and atraumatic.  Eyes: Right eye exhibits no discharge. Left eye exhibits no discharge. No scleral icterus.  Cardiovascular: Normal rate and regular rhythm.   No murmur heard. Pulmonary/Chest: Breath sounds normal. She has no wheezes. She has no rales.  agonal Breathing  Neurological:  Opens Eyes to voice  Skin: Skin is warm and dry. She is not diaphoretic.   Assessment/Plan:  Principal Problem:   NSTEMI (non-ST elevated myocardial infarction) ([place]) Active Problems:   Uncontrolled type 2 diabetes with renal manifestation (HCC)   Hyperlipidemia   ANEMIA   Essential hypertension   Coronary atherosclerosis   Dementia   CKD (chronic kidney disease) stage 4, GFR 15-29 ml/min (HCC)   CHF (congestive heart failure) (HCC)   Goals of care, counseling/discussion   Palliative care encounter  NSTEMI Hyperlipidemia  CHF Acute on chronic kidney disease stage 4 [name] Is less responsive today than she was yesterday evening. Her daughter and son are in the room and, they feel as though we have given the dobutamine a good chance and feel prepared to stop the drip at this time. The dobutamine drip will be stopped now and she will be kept comfortable.  Morphine when necessary Ativan when necessary  Hypertension  Uncontrolled type 2 diabetes with nephropathy  Transition to  comfort care.  - No further vital signs monitoring   Dispo: Anticipated discharge in approximately 1-2 day(s).   Nichole Noss, MD 01/13/2017, 11:32 AM Pager: [phone]

## 2017-01-13 NOTE — Progress Notes (Signed)
Per family request, a new IV was placed for medication.

## 2017-01-13 NOTE — Progress Notes (Signed)
Only IV infiltrated, Dr. Marda Stalker was made aware, asked me not to start more access, and will order other forms of pain medicine.

## 2017-01-13 NOTE — Progress Notes (Signed)
   01/13/17 0900  Clinical Encounter Type  Visited With Patient and family together  Visit Type Follow-up;Spiritual support  Referral From Nurse  Consult/Referral To Chaplain  Spiritual Encounters  Spiritual Needs Prayer;Emotional;Grief support  Stress Factors  Patient Stress Factors Health changes  Family Stress Factors Exhausted;Loss;Loss of control    Chaplain followed up on [place] consult. Provided ministry of presence, emotional support and prayer for family during difficult transition. Lashuna Tamashiro L. Volanda Napoleon, MDiv

## 2017-01-13 NOTE — Progress Notes (Signed)
Daily Progress Note   Patient Name: [name]       Date: 01/13/2017 DOB: 23-Mar-1929  Age: 83 y.o. MRN#: [id number] Attending Physician: Nichole Groves, DO Primary Care Physician: [name] Admit Date:   Reason for Consultation/Follow-up: Pain control, Psychosocial/spiritual support, Terminal Care and Withdrawal of life-sustaining treatment  Subjective: [name] had a good day yesterday and was able to talk with her family over the phone and in person. Overnight she became increasingly less responsive, to the point where she is now unresponsive. She does appear comfortable in bed. Extensive family at the bedside; they reiterate their goal of keeping her comfortable.   Length of Stay: 4  Current Medications: Scheduled Meds:  . mupirocin ointment  1 application Nasal BID    Continuous Infusions:   PRN Meds: [DISCONTINUED] acetaminophen **OR** acetaminophen, antiseptic oral rinse, clonazePAM, [DISCONTINUED] glycopyrrolate **OR** [DISCONTINUED] glycopyrrolate **OR** glycopyrrolate, LORazepam, morphine injection, nitroGLYCERIN, ondansetron, polyvinyl alcohol  Physical Exam  Constitutional:  Ill appearing woman now unresponsive in bed  HENT:  Head: Normocephalic and atraumatic.  Mouth/Throat: Mucous membranes are dry.  Cardiovascular: Normal rate and regular rhythm.   Pulmonary/Chest:  Variable breathing with periods of tachypnea and apnea. Rhonchi throughout. Poor air distribution with breaths  Abdominal: Soft. Bowel sounds are decreased.  Neurological:  Unresponsive  Skin: Skin is warm and dry.  Extremities warm, not mottling noted  Psychiatric:  Appears calm and comfortable in bed           Vital Signs: BP (!) 113/55 (BP Location: Left Arm)   Pulse 72   Temp 97.5 F (36.4 C) (Oral)   Resp 18   Ht 5'  3" (1.6 m)   Wt 66.2 kg (146 lb)   SpO2 97%   BMI 25.86 kg/m  SpO2: SpO2: 97 % O2 Device: O2 Device: Nasal Cannula O2 Flow Rate: O2 Flow Rate (L/min): 3 L/min  Intake/output summary:  Intake/Output Summary (Last 24 hours) at 01/13/17 1243 Last data filed at 01/13/17 0900  Gross [name] 24 hour  Intake           253.52 ml  Output              750 ml  Net          -496.48 ml   LBM: Last BM Date: 01/13/17 Baseline Weight: Weight: 65.8 kg (145 lb) Most recent weight: Weight: 66.2 kg (146 lb)  Palliative Assessment/Data: PPS 10%   Flowsheet Rows     Most Recent Value  Intake Tab  Referral Department  Cardiology  Unit at Time of Referral  Cardiac/Telemetry Unit  Palliative Care Primary Diagnosis  Cardiac  Date Notified  01/12/17  Palliative Care Type  New Palliative care  Reason for referral  Clarify Goals of Care  Date of Admission  12/25/2016  Date first seen by Palliative Care  01/12/17  # of days Palliative referral response time  0 Day(s)  # of days IP prior to Palliative referral  3  Clinical Assessment  Psychosocial & Spiritual Assessment  Palliative Care Outcomes      Patient Active Problem List   Diagnosis Date Noted  . Goals of care, counseling/discussion   .  Palliative care encounter   . NSTEMI (non-ST elevated myocardial infarction) ([place]) 01/13/2017  . Urinary retention   . Dyspnea   . Acute on chronic diastolic congestive heart failure ([place])   . CHF (congestive heart failure) ([place]) 02/07/2016  . Acute respiratory failure ([place]) 02/07/2016  . Uncontrolled type 2 diabetes mellitus with stage 4 chronic kidney disease, with long-term current use of insulin ([place])   . Hyperkalemia 07/18/2015  . Lethargy 06/04/2015  . Diabetic nephropathies ([place]) 05/27/2015  . Acute renal failure superimposed on stage 4 chronic kidney disease ([place]) 05/27/2015  . AKI (acute kidney injury) ([place])   . Hyperglycemia 05/26/2015  . Uncontrolled diabetes mellitus with hyperosmolarity,  with long-term current use of insulin ([place]) 05/26/2015  . Non-compliant behavior 04/01/2015  . Syncope 11/18/2014  . Skin change   . Breast cancer ([place])   . Edema   . Hyperosmolar (nonketotic) coma ([place]) 04/01/2012  . Fracture of left toe 04/01/2012  . CKD (chronic kidney disease) stage 4, GFR 15-29 ml/min (HCC) 04/01/2012  . Unstable angina ([place]) 11/21/2011  . Low back pain   . Depression 06/24/2009  . Dementia 06/24/2009  . INSOMNIA, CHRONIC 03/25/2009  . Hyperlipidemia 03/10/2009  . ANEMIA 03/10/2009  . Coronary atherosclerosis 03/10/2009  . PVD 03/10/2009  . Uncontrolled type 2 diabetes with renal manifestation ([place]) 03/09/2009  . Essential hypertension 03/09/2009    Palliative Care Assessment & Plan   HPI: 83 y.o. female  with past medical history of diastolic CHF (EF 78-58%), HTN, CAD (STEMI x2),type 2 diabetes mellitus, CKD stage 4, breast cancer (s/p lumpectomy 2000), and dementia. She was admitted on 01/04/2017 with SOB.   ECHO resulted in akinesis of the mid-apicalanteroseptal and apical myocardium with EF 10%. On 01/12/17 she developed symptomatic bradycardia into 20s requiring dobutamine infusion. Also with worsening renal function. Palliative care consulted in setting of very poor prognosis.   Assessment: [name] remains on full comfort measures. She does appear comfortable with well controlled symptoms when I visited throughout the day. Her family at the bedside reiterated their wish that she remain peaceful and comfortable as she transitions to death. We talked through some of the expected changes that will occur, and I reinforced their role in monitoring for symptoms and advocating for her needs. They were appreciative to have this information.   Recommendations/Plan:  Full comfort care; I anticipate a hospital death as she is clearly transitioning and I do not believe she could tolerate the transfer out of the hospital at this point  As IV access lost, medication  adjusted for SQ or SL administration   Goals of Care and Additional Recommendations:  Limitations on Scope of Treatment: Full Comfort Care  Code Status:  DNR  Prognosis:   Hours - Days  Discharge Planning:  Anticipated Hospital Death  Care plan was discussed with pt's extended family at bedside.   Thank you for allowing the Palliative Medicine Team to assist in the care of this patient.  Total time: 35 minutes    Greater than 50%  of this time was spent counseling and coordinating care related to the above assessment and plan.  Charlynn Court, NP Palliative Medicine Team [phone] pager (7a-5p) Team Phone # [phone]

## 2017-01-13 NOTE — Progress Notes (Signed)
Internal Medicine Attending:   I saw and examined the patient. I reviewed the resident's note and I agree with the resident's findings and plan as documented in the resident's note. Daughter, son and niece present at bedside.  [name] appeared to be resting comfortably, she does wake to voice, she denied complaints.  Per nursing she has had some agonal breathing at times overnight.  Family reports that she seems a Care more confused this morning but overall they think she is comfortable, they are appreciative of the care she has received. They would like to discontinue dobutamine at this point. Continue to focus on comfort measures.

## 2017-01-13 NOTE — Progress Notes (Signed)
Rounding team this morning said it was okay to d/c Dobutamine gtt. Family was very clear that they were okay to d/c the gtt as well.

## 2017-01-13 NOTE — Care Management (Signed)
1524 01-13-17 Received patient as a transfer from [facility] following patient- DNR-full comfort care. Per Palliative Notes pt will not be able to tolerate transport to Residential Facility. [facility] Death. CM will continue to monitor. Linton Ham Case Manager [phone]

## 2017-01-14 DIAGNOSIS — I509 Heart failure, unspecified: Secondary | ICD-10-CM

## 2017-01-14 DIAGNOSIS — E785 Hyperlipidemia, unspecified: Secondary | ICD-10-CM

## 2017-01-14 DIAGNOSIS — E1122 Type 2 diabetes mellitus with diabetic chronic kidney disease: Secondary | ICD-10-CM

## 2017-01-14 DIAGNOSIS — Z515 Encounter for palliative care: Secondary | ICD-10-CM

## 2017-01-14 DIAGNOSIS — R69 Illness, unspecified: Secondary | ICD-10-CM

## 2017-01-14 DIAGNOSIS — I5033 Acute on chronic diastolic (congestive) heart failure: Secondary | ICD-10-CM

## 2017-01-14 DIAGNOSIS — I130 Hypertensive heart and chronic kidney disease with heart failure and stage 1 through stage 4 chronic kidney disease, or unspecified chronic kidney disease: Secondary | ICD-10-CM

## 2017-01-14 DIAGNOSIS — I214 Non-ST elevation (NSTEMI) myocardial infarction: Secondary | ICD-10-CM

## 2017-01-14 DIAGNOSIS — N184 Chronic kidney disease, stage 4 (severe): Secondary | ICD-10-CM

## 2017-01-14 NOTE — Progress Notes (Signed)
   Subjective: [name] is not responding verbally when seen on rounds this morning. She has multiple family members conversing in the room and they ask good questions about her progress.   Objective:  Vital signs in last 24 hours: Vitals:   13-Feb-2017 0200 Feb 13, 2017 0300 Feb 13, 2017 0425 Feb 13, 2017 0600  BP:      Pulse:   64 62  Resp: (!) 5 (!) 6 (!) 6 (!) 6  Temp:      TempSrc:      SpO2:      Weight:      Height:       Physical Exam  Constitutional: She is well-developed, well-nourished, and in no distress. No distress.  HENT:  Head: Normocephalic and atraumatic.  Pulmonary/Chest: Breath sounds normal.  Breathing comfortably   Neurological:  Not opening eyes to voice   Skin: Skin is warm and dry. She is not diaphoretic.  Peripheral pulses intact    Assessment/Plan:  Principal Problem:   NSTEMI (non-ST elevated myocardial infarction) ([place]) Active Problems:   Uncontrolled type 2 diabetes with renal manifestation (HCC)   Hyperlipidemia   ANEMIA   Essential hypertension   Coronary atherosclerosis   Dementia   CKD (chronic kidney disease) stage 4, GFR 15-29 ml/min (HCC)   CHF (congestive heart failure) (HCC)   Goals of care, counseling/discussion   Palliative care encounter   Terminal care   Severe comorbid illness  NSTEMI Hyperlipidemia  CHF Acute on chronic kidney disease stage 4 [name]'s breathing appears more comfortable today. She received 2 doses of morphine overnight and does not appear to be in pain.  Morphine when necessary Ativan when necessary  Hypertension  Uncontrolled type 2 diabetes with nephropathy  Transition to comfort care.  - No further vital signs monitoring   Dispo: Anticipated hospital death   Ledell Noss, MD 02-13-2017, 11:13 AM Pager: [phone]

## 2017-01-14 NOTE — Plan of Care (Signed)
Problem: Education: Goal: Knowledge of [facility] Education information/materials will improve Outcome: Not Progressing Pt full comfort care. Educated family of progression in dying process

## 2017-01-14 NOTE — Progress Notes (Signed)
Daily Progress Note   Patient Name: [name]       Date: 01/04/2017 DOB: Aug 19, 1928  Age: 83 y.o. MRN#: [id number] Attending Physician: Lucious Groves, DO Primary Care Physician: Patient, No Pcp Per Admit Date: 01/01/2017  Reason for Consultation/Follow-up: Non pain symptom management, Pain control, Psychosocial/spiritual support and Terminal Care  Subjective: Pt unresponsive. No non-verbal s/s of pain . Per chart review, 2 PRN MS04 in 24 hr, No other PRN's noted. Multiple family members at bedside  Length of Stay: 5  Current Medications: Scheduled Meds:  . mupirocin ointment  1 application Nasal BID    Continuous Infusions:   PRN Meds: [DISCONTINUED] acetaminophen **OR** acetaminophen, antiseptic oral rinse, clonazePAM, [DISCONTINUED] glycopyrrolate **OR** [DISCONTINUED] glycopyrrolate **OR** glycopyrrolate, ipratropium-albuterol, LORazepam, morphine injection, nitroGLYCERIN, ondansetron, polyvinyl alcohol  Physical Exam  Constitutional: She appears well-developed and well-nourished.  Acutely ill appearing elderly female in no acute distress  Pulmonary/Chest: Effort normal.  resp very shallow  Neurological:  unresponsive  Skin: Skin is warm and dry.  Psychiatric:  Unable to test  Nursing note and vitals reviewed.           Vital Signs: BP (!) 112/57 (BP Location: Left Arm)   Pulse 62   Temp 97.9 F (36.6 C) (Axillary)   Resp (!) 6   Ht 5\' 3"  (1.6 m)   Wt 66.2 kg (146 lb)   SpO2 90%   BMI 25.86 kg/m  SpO2: SpO2: 90 % O2 Device: O2 Device: Nasal Cannula O2 Flow Rate: O2 Flow Rate (L/min): 2 L/min  Intake/output summary:  Intake/Output Summary (Last 24 hours) at 01/16/2017 1025 Last data filed at 01/12/2017 0550  Gross per 24 hour  Intake                0 ml    Output              150 ml  Net             -150 ml   LBM: Last BM Date: 01/13/17 Baseline Weight: Weight: 65.8 kg (145 lb) Most recent weight: Weight: 66.2 kg (146 lb)       Palliative Assessment/Data:    Flowsheet Rows     Most Recent Value  Intake Tab  Referral Department  Cardiology  Unit at Time of Referral  Cardiac/Telemetry Unit  Palliative Care Primary Diagnosis  Cardiac  Date Notified  01/12/17  Palliative Care Type  New Palliative care  Reason for referral  Clarify Goals of Care  Date of Admission  01/08/2017  Date first seen by Palliative Care  01/12/17  # of days Palliative referral response time  0 Day(s)  # of days IP prior to Palliative referral  3  Clinical Assessment  Psychosocial & Spiritual Assessment  Palliative Care Outcomes      Patient Active Problem List   Diagnosis Date Noted  . Terminal care   . Goals of care, counseling/discussion   . Palliative care encounter   . NSTEMI (non-ST elevated myocardial infarction) ([place]) 01/06/2017  . Urinary retention   . Dyspnea   . Acute on chronic diastolic congestive heart failure ([place])   . CHF (congestive heart failure) ([place]) 02/07/2016  . Acute respiratory failure ([place]) 02/07/2016  . Uncontrolled type 2 diabetes mellitus with stage 4 chronic kidney disease, with long-term current use of insulin ([place])   . Hyperkalemia 07/18/2015  . Lethargy 06/04/2015  . Diabetic nephropathies ([place]) 05/27/2015  . Acute renal failure superimposed on stage 4 chronic kidney disease ([place]) 05/27/2015  . AKI (acute kidney injury) ([place])   . Hyperglycemia 05/26/2015  . Uncontrolled diabetes mellitus with hyperosmolarity, with long-term current use of insulin ([place]) 05/26/2015  . Non-compliant behavior 04/01/2015  . Syncope 11/18/2014  . Skin change   . Breast cancer ([place])   . Edema   . Hyperosmolar (nonketotic) coma ([place]) 04/01/2012  . Fracture of left toe 04/01/2012  . CKD (chronic kidney disease) stage 4, GFR 15-29 ml/min  (HCC) 04/01/2012  . Unstable angina ([place]) 11/21/2011  . Low back pain   . Depression 06/24/2009  . Dementia 06/24/2009  . INSOMNIA, CHRONIC 03/25/2009  . Hyperlipidemia 03/10/2009  . ANEMIA 03/10/2009  . Coronary atherosclerosis 03/10/2009  . PVD 03/10/2009  . Uncontrolled type 2 diabetes with renal manifestation ([place]) 03/09/2009  . Essential hypertension 03/09/2009    Palliative Care Assessment & Plan   Patient Profile: 83 y.o.femalewith past medical history of diastolic CHF (EF 93-26%), HTN, CAD (STEMI x2),type 2 diabetes mellitus, CKD stage 4, breast cancer (s/p lumpectomy 2000), and dementia. She wasadmitted on 6/18/2018with SOB.   ECHO resulted in akinesis of the mid-apicalanteroseptal and apical myocardium with EF 10%. On 01/12/17 she developed symptomatic bradycardia into 20s requiring dobutamine infusion. Also with worsening renal function. Palliative care consulted in setting of very poor prognosis.  She is now transitioning, full comfort care   Recommendations/Plan:  Continue MS04 on PRN basis. Monitor for need for scheduled dosing  No upper airway secretions. Cont with robinul PRN  No agitation. PRN ativan available  Goals of Care and Additional Recommendations:  Limitations on Scope of Treatment: Full Scope Treatment  Code Status:    Code Status Orders        Start     Ordered   01/12/17 1309  Do not attempt resuscitation (DNR)  Continuous    Question Answer Comment  In the event of cardiac or respiratory ARREST Do not call a "code blue"   In the event of cardiac or respiratory ARREST Do not perform Intubation, CPR, defibrillation or ACLS   In the event of cardiac or respiratory ARREST Use medication by any route, position, wound care, and other measures to relive pain and suffering. May use oxygen, suction and manual treatment of airway obstruction as needed for comfort.      01/12/17 1308  Code Status History    Date Active Date Inactive Code  Status Order ID Comments User Context   01/12/2017  9:23 AM 01/12/2017  1:08 PM DNR [id number]  Adrian Prows, MD Inpatient   01/10/2017  8:34 AM 01/12/2017  9:22 AM DNR [id number]  Adrian Prows, MD Inpatient   12/27/2016  7:00 PM 01/10/2017  8:34 AM DNR [id number]  Jule Ser, DO Inpatient   01/07/2017  4:41 PM 01/08/2017  7:00 PM Full Code [id number]  Ledell Noss, MD Inpatient   03/09/2016  3:33 AM 03/13/2016  9:40 PM Full Code [id number]  Karmen Bongo, MD Inpatient   02/07/2016  2:24 AM 02/11/2016  5:44 PM Full Code [id number]  Quintella Baton, MD Inpatient   07/18/2015 11:41 AM 07/19/2015  1:22 PM DNR [id number]  Domenic Polite, MD Inpatient   05/26/2015  8:53 PM 05/28/2015  4:05 PM Full Code [id number]  Reubin Milan, MD Inpatient   11/18/2014  5:26 PM 11/21/2014  3:43 PM Full Code [id number]  Mendel Corning, MD ED   04/01/2012  9:38 AM 04/04/2012  5:44 PM DNR [id number]  Samuella Cota, MD Inpatient   11/21/2011 11:44 AM 11/23/2011 12:22 PM Full Code [id number]  Marla Roe, RN Inpatient   07/11/2011  8:03 AM 07/13/2011  8:10 PM Full Code [id number]  Stacie Glaze, RN ED    Advance Directive Documentation     Most Recent Value  Type of Advance Directive  Out of facility DNR (pink MOST or yellow form)  Pre-existing out of facility DNR order (yellow form or pink MOST form)  Physician notified to receive inpatient order [per dr. Einar Gip, will sign MOST form]  "MOST" Form in Place?  -       Prognosis:   Hours - Days  Discharge Planning:  Anticipated Hospital Death   Thank you for allowing the Palliative Medicine Team to assist in the care of this patient.   Time In: 0800 Time Out: 0835 Total Time 35 min Prolonged Time Billed  no       Greater than 50%  of this time was spent counseling and coordinating care related to the above assessment and plan.  Dory Horn, NP  Please contact Palliative Medicine Team phone at [phone] for questions and concerns.

## 2017-01-14 NOTE — Progress Notes (Addendum)
Pharmacist Heart Failure Core Measure Documentation  Assessment: [name] has an EF documented as 10-20% on 6/20 by ECHO.  Rationale: Heart failure patients with left ventricular systolic dysfunction (LVSD) and an EF < 40% should be prescribed an angiotensin converting enzyme inhibitor (ACEI) or angiotensin receptor blocker (ARB) at discharge unless a contraindication is documented in the medical record.  This patient is not currently on an ACEI or ARB for HF.  This note is being placed in the record in order to provide documentation that a contraindication to the use of these agents is present for this encounter.  ACE Inhibitor or Angiotensin Receptor Blocker is contraindicated (specify all that apply)  []   ACEI allergy AND ARB allergy []   Angioedema []   Moderate or severe aortic stenosis []   Hyperkalemia []   Hypotension []   Renal artery stenosis [x]   Worsening renal function, palliative care  Demetrius Charity, PharmD Acute Care Pharmacy Resident  Pager: [phone] 12/29/2016

## 2017-01-14 NOTE — Progress Notes (Signed)
  Date: 01/11/2017  Patient name: Warren record number: [id number]  Date of birth: [birth year]   I have seen and evaluated this patient and I have discussed the plan of care with the house staff. Please see Dr. Fredrik Cove note for complete details. I concur with her findings.  Reviewed Palliative Care note.   Sid Falcon, MD 12/24/2016, 11:55 AM

## 2017-01-22 NOTE — Progress Notes (Addendum)
00:46-Absence of respirations and pulse.  Verified with Carlynn Purl, RN, and Henderson Newcomer, RN. Dr. Reesa Chew notified.

## 2017-01-22 NOTE — Death Summary Note (Signed)
  Name: [name] MRN: [id number] DOB: [birth year] 83 y.o.  Date of Admission: 01/01/2017 10:23 AM Date of Discharge: 01-24-17 Attending Physician: Sid Falcon, MD   Discharge Diagnosis:    NSTEMI (non-ST elevated myocardial infarction) [facility])   CHF (congestive heart failure) ([place])  Cause of death: decompensated heart failure  Time of death: 00:46  Disposition and follow-up:   Ms.[name] was discharged from [facility] in expired condition.    Hospital Course: [name] is an 83 yo woman with PMH CAD, diastolic CHF, HTN, CKD4, anemia of chronic disease and dementia who presented from home with shortness of breath. She was brought in by her son and daughter who reported that she appeared to have trouble breathing on the morning when she was brought in. She denied chest pain, nausea, palpitations, abdominal pain, or other complaints other than the SOB. In the ED chest xray showed small bilateral pleural effusions and pulmonary edema, BNP was elevated (3500) and troponin was significantly elevated (8.4) but her EKG was unchanged from prior. She had elevated JVD and bibasilar crackles. Cardiology was consulted and she was started on IV lasix, heparin drip, and supplemental oxygen. Chest xray on the day following admission showed improvement of her pulmonary edema and she was transitioned to room air but continued to have an appearance of uncomfortable breathing. Echo on the third day of admission showed EF 10-20%, with akinesia of the anteroseptal and apical myocardium and pulmonary artery pressure 46 mmHg and a dilated IVC with blunted response to increased thoracic pressure. We attempted to continue IV diuresis but her urinary output decreased significantly on day four. Creatinine was elevated at the time of presentation and continued to increase throughout this admission. After the echo results were read she was started on a dobutamine drip, despite the drip she had  bradycardia to the 30s at times. Her family met with palliative care and decided to switch to comfort measures. PRN ativan and morphine were started. On the day after the dobutamine drip was stopped she was breathing 6 breaths per minute and was no longer verbally responsive. Throughout the palliative care course of her hospitalization multiple family members were found in her room reminiscing on her life, they expressed their concern that they did not want her to suffer. On 6/24 at 0:46 she was found to have no pulse or respirations.   Signed: Ledell Noss, MD 01/17/2017, 7:45 AM

## 2017-01-22 DEATH — deceased

## 2017-02-22 NOTE — Death Summary Note (Signed)
Addendum to previous documentation by Dr. Hetty Ely - -  Clarification: CHF was noted to be severe, left sided systolic.

## 2017-03-13 IMAGING — CR DG KNEE COMPLETE 4+V*R*
4 series · 4 of 4 positions shown · non-contrast
Comparison: No prior plain film

CLINICAL DATA: 86-year-old female with a history of right hip and
knee pain

EXAM:
RIGHT KNEE - COMPLETE 4+ VIEW

[t knee ap right]
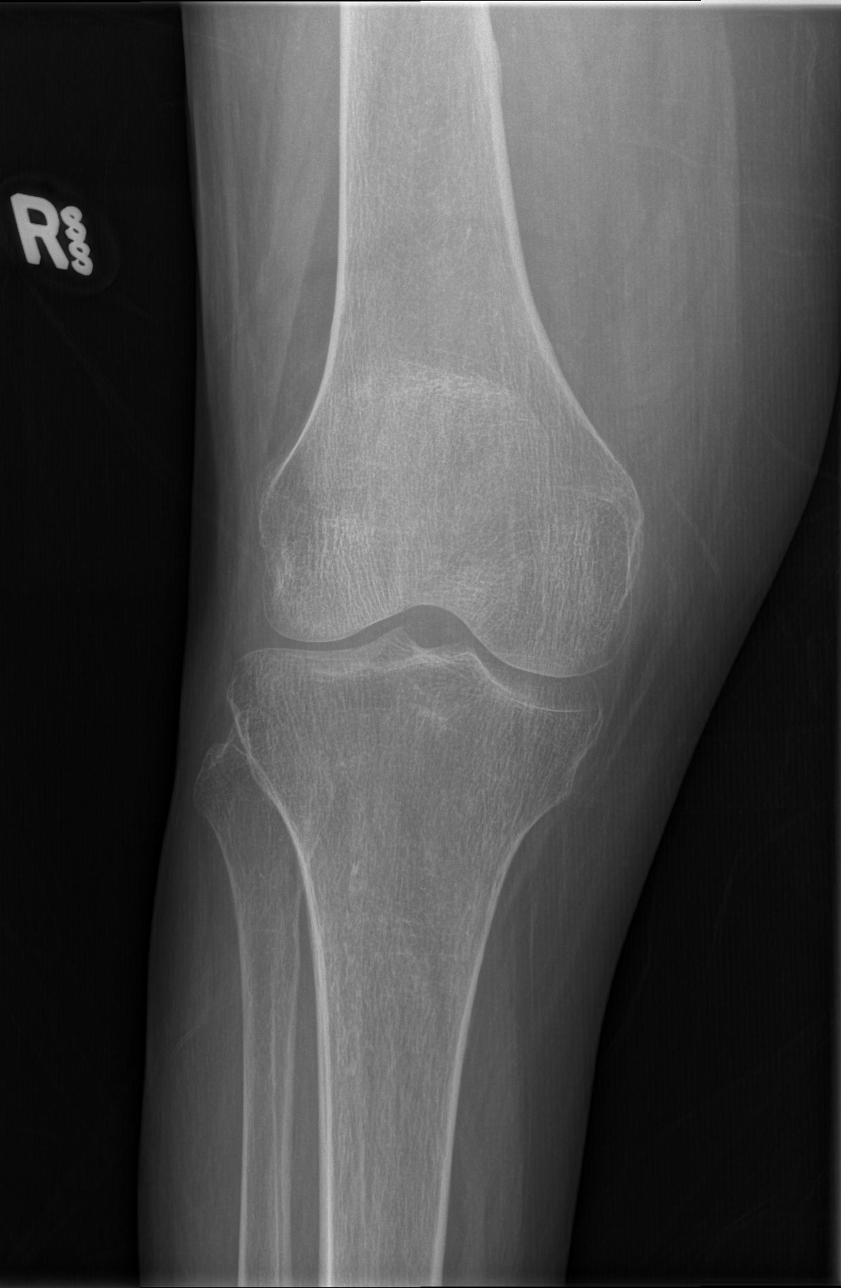

[t knee obl right (1 of 2)]
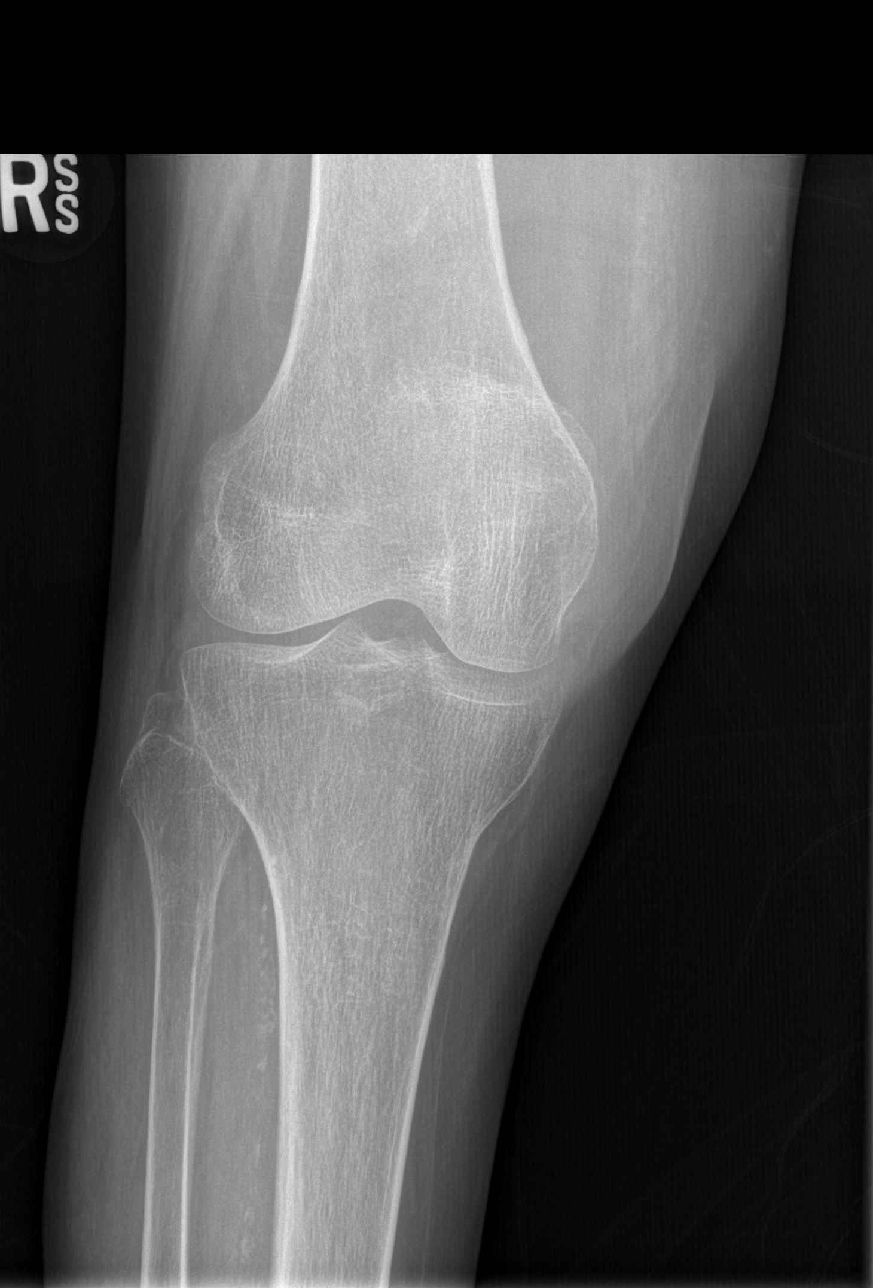

[t knee obl right (2 of 2)]
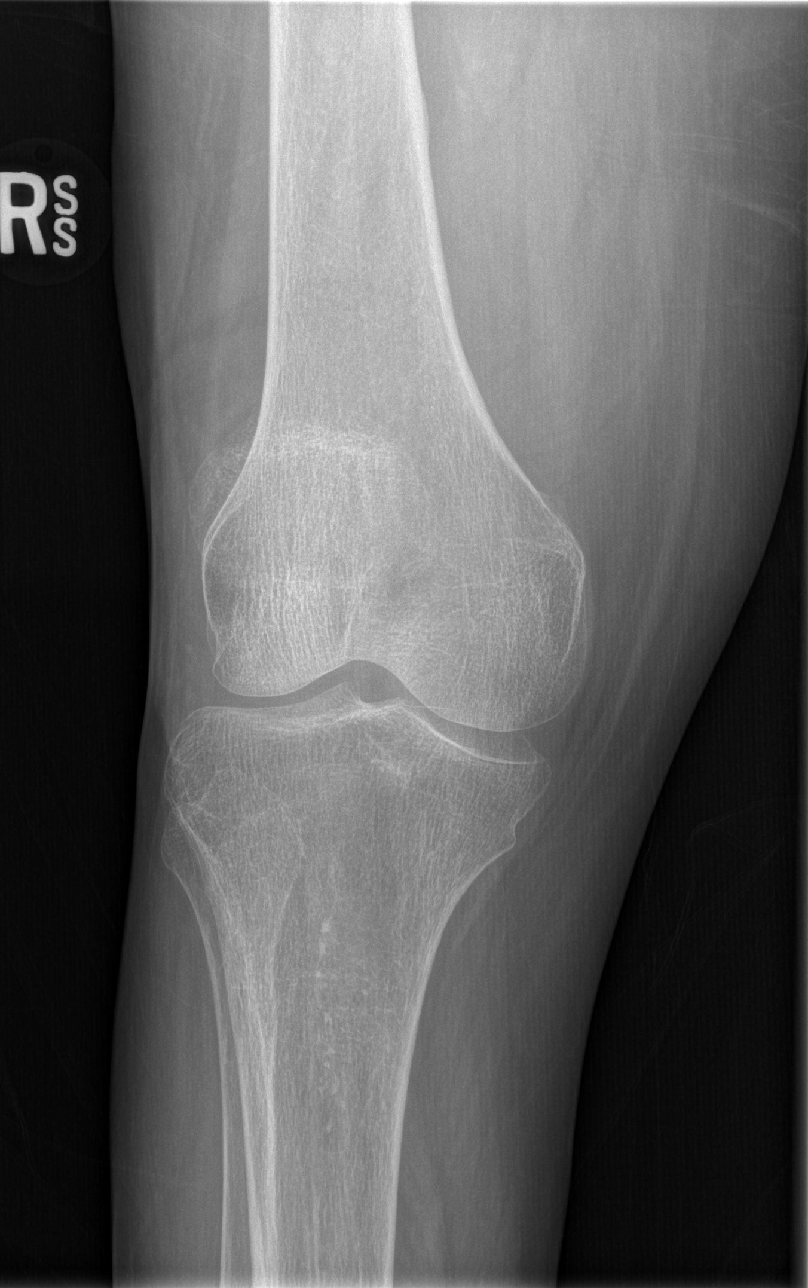

[x knee lat right]
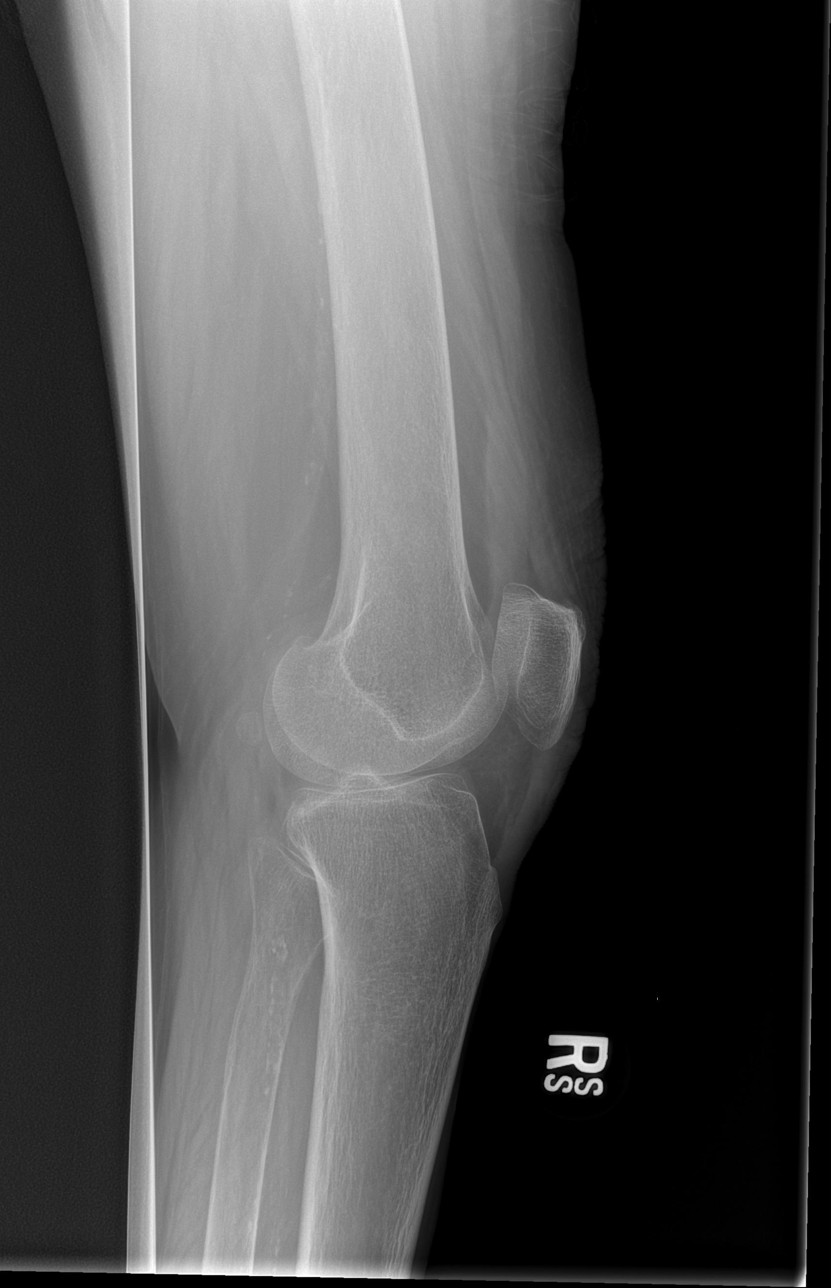

[4 of 4 positions shown; findings below may reference images not displayed]

FINDINGS: No acute fracture identified. No significant soft tissue swelling.
No joint effusion.

Osteopenia.

Vascular calcifications of the femoral popliteal system and tibial
vessels.
IMPRESSION: Negative for acute bony abnormality.

Atherosclerosis involving the femoral popliteal and tibial vessels.
